# Patient Record
Sex: Male | Born: 1935 | Race: Black or African American | Hispanic: No | Smoking: Never smoker
Health system: Southern US, Community
[De-identification: ages and names within clinical notes are randomized; demographics above are authoritative.]

## PROBLEM LIST (undated history)

## (undated) DIAGNOSIS — F0390 Unspecified dementia without behavioral disturbance: Secondary | ICD-10-CM

## (undated) DIAGNOSIS — C61 Malignant neoplasm of prostate: Secondary | ICD-10-CM

## (undated) DIAGNOSIS — K648 Other hemorrhoids: Secondary | ICD-10-CM

## (undated) DIAGNOSIS — K5731 Diverticulosis of large intestine without perforation or abscess with bleeding: Secondary | ICD-10-CM

## (undated) DIAGNOSIS — M1990 Unspecified osteoarthritis, unspecified site: Secondary | ICD-10-CM

## (undated) DIAGNOSIS — K8020 Calculus of gallbladder without cholecystitis without obstruction: Secondary | ICD-10-CM

## (undated) DIAGNOSIS — F03A0 Unspecified dementia, mild, without behavioral disturbance, psychotic disturbance, mood disturbance, and anxiety: Secondary | ICD-10-CM

## (undated) DIAGNOSIS — I442 Atrioventricular block, complete: Secondary | ICD-10-CM

## (undated) DIAGNOSIS — N179 Acute kidney failure, unspecified: Secondary | ICD-10-CM

## (undated) DIAGNOSIS — I10 Essential (primary) hypertension: Secondary | ICD-10-CM

## (undated) DIAGNOSIS — I4891 Unspecified atrial fibrillation: Secondary | ICD-10-CM

## (undated) DIAGNOSIS — I2510 Atherosclerotic heart disease of native coronary artery without angina pectoris: Secondary | ICD-10-CM

## (undated) DIAGNOSIS — D649 Anemia, unspecified: Secondary | ICD-10-CM

## (undated) DIAGNOSIS — K922 Gastrointestinal hemorrhage, unspecified: Secondary | ICD-10-CM

## (undated) DIAGNOSIS — Z950 Presence of cardiac pacemaker: Secondary | ICD-10-CM

## (undated) DIAGNOSIS — K5010 Crohn's disease of large intestine without complications: Secondary | ICD-10-CM

## (undated) DIAGNOSIS — E875 Hyperkalemia: Secondary | ICD-10-CM

## (undated) DIAGNOSIS — S065X9A Traumatic subdural hemorrhage with loss of consciousness of unspecified duration, initial encounter: Secondary | ICD-10-CM

## (undated) DIAGNOSIS — K259 Gastric ulcer, unspecified as acute or chronic, without hemorrhage or perforation: Secondary | ICD-10-CM

## (undated) DIAGNOSIS — K56609 Unspecified intestinal obstruction, unspecified as to partial versus complete obstruction: Secondary | ICD-10-CM

## (undated) DIAGNOSIS — T148XXA Other injury of unspecified body region, initial encounter: Secondary | ICD-10-CM

## (undated) DIAGNOSIS — A0472 Enterocolitis due to Clostridium difficile, not specified as recurrent: Secondary | ICD-10-CM

## (undated) DIAGNOSIS — S065XAA Traumatic subdural hemorrhage with loss of consciousness status unknown, initial encounter: Secondary | ICD-10-CM

## (undated) DIAGNOSIS — K5090 Crohn's disease, unspecified, without complications: Secondary | ICD-10-CM

## (undated) DIAGNOSIS — J209 Acute bronchitis, unspecified: Secondary | ICD-10-CM

## (undated) HISTORY — DX: Malignant neoplasm of prostate: C61

## (undated) HISTORY — DX: Enterocolitis due to Clostridium difficile, not specified as recurrent: A04.72

## (undated) HISTORY — PX: CATARACT EXTRACTION, BILATERAL: SHX1313

## (undated) HISTORY — DX: Unspecified osteoarthritis, unspecified site: M19.90

## (undated) HISTORY — DX: Anemia, unspecified: D64.9

## (undated) HISTORY — DX: Unspecified dementia without behavioral disturbance: F03.90

## (undated) HISTORY — DX: Gastric ulcer, unspecified as acute or chronic, without hemorrhage or perforation: K25.9

## (undated) HISTORY — DX: Other injury of unspecified body region, initial encounter: T14.8XXA

## (undated) HISTORY — DX: Crohn's disease, unspecified, without complications: K50.90

## (undated) HISTORY — DX: Acute kidney failure, unspecified: N17.9

## (undated) HISTORY — DX: Atrioventricular block, complete: I44.2

## (undated) HISTORY — DX: Traumatic subdural hemorrhage with loss of consciousness status unknown, initial encounter: S06.5XAA

## (undated) HISTORY — DX: Traumatic subdural hemorrhage with loss of consciousness of unspecified duration, initial encounter: S06.5X9A

## (undated) HISTORY — DX: Crohn's disease of large intestine without complications: K50.10

## (undated) HISTORY — DX: Unspecified atrial fibrillation: I48.91

## (undated) HISTORY — PX: TONSILLECTOMY AND ADENOIDECTOMY: SUR1326

## (undated) HISTORY — DX: Essential (primary) hypertension: I10

## (undated) HISTORY — DX: Atherosclerotic heart disease of native coronary artery without angina pectoris: I25.10

## (undated) HISTORY — DX: Hyperkalemia: E87.5

## (undated) HISTORY — PX: INSERT / REPLACE / REMOVE PACEMAKER: SUR710

## (undated) HISTORY — DX: Diverticulosis of large intestine without perforation or abscess with bleeding: K57.31

## (undated) HISTORY — PX: PACEMAKER PLACEMENT: SHX43

## (undated) HISTORY — DX: Gastrointestinal hemorrhage, unspecified: K92.2

## (undated) HISTORY — DX: Acute bronchitis, unspecified: J20.9

## (undated) HISTORY — DX: Unspecified intestinal obstruction, unspecified as to partial versus complete obstruction: K56.609

## (undated) HISTORY — PX: BURR HOLE FOR SUBDURAL HEMATOMA: SHX1275

## (undated) HISTORY — DX: Unspecified dementia, mild, without behavioral disturbance, psychotic disturbance, mood disturbance, and anxiety: F03.A0

## (undated) HISTORY — DX: Other hemorrhoids: K64.8

## (undated) HISTORY — DX: Calculus of gallbladder without cholecystitis without obstruction: K80.20

---

## 1992-06-03 HISTORY — PX: PERICARDIOCENTESIS: SHX2215

## 1998-12-19 ENCOUNTER — Encounter: Payer: Self-pay | Admitting: Emergency Medicine

## 1998-12-19 ENCOUNTER — Emergency Department (HOSPITAL_COMMUNITY): Admission: EM | Admit: 1998-12-19 | Discharge: 1998-12-19 | Payer: Self-pay | Admitting: Emergency Medicine

## 1999-06-04 HISTORY — PX: LIPOMA EXCISION: SHX5283

## 2000-03-04 ENCOUNTER — Encounter: Payer: Self-pay | Admitting: Orthopedic Surgery

## 2000-03-04 ENCOUNTER — Ambulatory Visit (HOSPITAL_COMMUNITY): Admission: RE | Admit: 2000-03-04 | Discharge: 2000-03-04 | Payer: Self-pay | Admitting: Orthopedic Surgery

## 2001-10-29 ENCOUNTER — Encounter (INDEPENDENT_AMBULATORY_CARE_PROVIDER_SITE_OTHER): Payer: Self-pay | Admitting: *Deleted

## 2001-10-29 ENCOUNTER — Inpatient Hospital Stay (HOSPITAL_COMMUNITY): Admission: EM | Admit: 2001-10-29 | Discharge: 2001-11-14 | Payer: Self-pay | Admitting: Emergency Medicine

## 2001-11-06 ENCOUNTER — Encounter: Payer: Self-pay | Admitting: Gastroenterology

## 2001-11-07 ENCOUNTER — Encounter: Payer: Self-pay | Admitting: Surgery

## 2001-11-08 ENCOUNTER — Encounter: Payer: Self-pay | Admitting: Otolaryngology

## 2001-12-25 ENCOUNTER — Emergency Department (HOSPITAL_COMMUNITY): Admission: EM | Admit: 2001-12-25 | Discharge: 2001-12-26 | Payer: Self-pay | Admitting: Emergency Medicine

## 2001-12-25 ENCOUNTER — Encounter: Payer: Self-pay | Admitting: Emergency Medicine

## 2001-12-26 ENCOUNTER — Encounter: Payer: Self-pay | Admitting: Emergency Medicine

## 2001-12-26 ENCOUNTER — Inpatient Hospital Stay (HOSPITAL_COMMUNITY): Admission: EM | Admit: 2001-12-26 | Discharge: 2002-01-05 | Payer: Self-pay | Admitting: Emergency Medicine

## 2001-12-27 ENCOUNTER — Encounter: Payer: Self-pay | Admitting: General Surgery

## 2001-12-28 ENCOUNTER — Encounter: Payer: Self-pay | Admitting: General Surgery

## 2001-12-31 ENCOUNTER — Encounter: Payer: Self-pay | Admitting: Surgery

## 2002-01-15 ENCOUNTER — Ambulatory Visit (HOSPITAL_COMMUNITY): Admission: RE | Admit: 2002-01-15 | Discharge: 2002-01-15 | Payer: Self-pay | Admitting: Surgery

## 2002-01-15 ENCOUNTER — Encounter: Payer: Self-pay | Admitting: Gastroenterology

## 2002-05-03 HISTORY — PX: SUBTOTAL COLECTOMY: SHX855

## 2002-07-01 ENCOUNTER — Inpatient Hospital Stay (HOSPITAL_COMMUNITY): Admission: EM | Admit: 2002-07-01 | Discharge: 2002-07-02 | Payer: Self-pay | Admitting: Emergency Medicine

## 2004-04-18 ENCOUNTER — Ambulatory Visit: Payer: Self-pay | Admitting: Pulmonary Disease

## 2004-04-27 ENCOUNTER — Ambulatory Visit: Payer: Self-pay

## 2004-05-25 ENCOUNTER — Emergency Department (HOSPITAL_COMMUNITY): Admission: EM | Admit: 2004-05-25 | Discharge: 2004-05-25 | Payer: Self-pay | Admitting: Emergency Medicine

## 2004-05-29 ENCOUNTER — Ambulatory Visit: Payer: Self-pay | Admitting: Cardiology

## 2004-07-03 ENCOUNTER — Ambulatory Visit: Payer: Self-pay

## 2004-07-11 ENCOUNTER — Ambulatory Visit: Payer: Self-pay | Admitting: Internal Medicine

## 2004-08-16 ENCOUNTER — Ambulatory Visit: Payer: Self-pay | Admitting: Internal Medicine

## 2004-08-23 ENCOUNTER — Ambulatory Visit: Payer: Self-pay | Admitting: Pulmonary Disease

## 2004-09-04 ENCOUNTER — Ambulatory Visit: Payer: Self-pay | Admitting: Gastroenterology

## 2004-09-11 ENCOUNTER — Ambulatory Visit: Payer: Self-pay | Admitting: Internal Medicine

## 2004-09-24 ENCOUNTER — Ambulatory Visit: Payer: Self-pay | Admitting: Gastroenterology

## 2004-10-15 ENCOUNTER — Ambulatory Visit: Payer: Self-pay | Admitting: Internal Medicine

## 2004-11-08 ENCOUNTER — Ambulatory Visit: Payer: Self-pay

## 2004-11-13 ENCOUNTER — Ambulatory Visit: Payer: Self-pay | Admitting: Internal Medicine

## 2004-11-23 ENCOUNTER — Ambulatory Visit (HOSPITAL_COMMUNITY): Admission: RE | Admit: 2004-11-23 | Discharge: 2004-11-23 | Payer: Self-pay | Admitting: Internal Medicine

## 2004-11-23 ENCOUNTER — Ambulatory Visit: Payer: Self-pay | Admitting: Internal Medicine

## 2004-12-10 ENCOUNTER — Ambulatory Visit: Payer: Self-pay

## 2004-12-13 ENCOUNTER — Ambulatory Visit: Payer: Self-pay | Admitting: Internal Medicine

## 2004-12-17 ENCOUNTER — Ambulatory Visit: Payer: Self-pay | Admitting: Pulmonary Disease

## 2005-03-12 ENCOUNTER — Ambulatory Visit: Payer: Self-pay | Admitting: Cardiology

## 2005-03-12 ENCOUNTER — Inpatient Hospital Stay (HOSPITAL_COMMUNITY): Admission: EM | Admit: 2005-03-12 | Discharge: 2005-03-13 | Payer: Self-pay | Admitting: *Deleted

## 2005-03-18 ENCOUNTER — Ambulatory Visit: Payer: Self-pay

## 2005-03-28 ENCOUNTER — Ambulatory Visit: Payer: Self-pay | Admitting: Internal Medicine

## 2005-03-28 ENCOUNTER — Ambulatory Visit: Payer: Self-pay | Admitting: Cardiology

## 2005-04-19 ENCOUNTER — Ambulatory Visit: Payer: Self-pay | Admitting: Pulmonary Disease

## 2005-08-19 ENCOUNTER — Ambulatory Visit: Payer: Self-pay | Admitting: Pulmonary Disease

## 2005-09-18 ENCOUNTER — Ambulatory Visit: Payer: Self-pay | Admitting: Cardiology

## 2005-11-18 ENCOUNTER — Ambulatory Visit: Payer: Self-pay

## 2005-11-20 ENCOUNTER — Ambulatory Visit: Payer: Self-pay | Admitting: Pulmonary Disease

## 2006-02-11 ENCOUNTER — Ambulatory Visit: Payer: Self-pay | Admitting: Pulmonary Disease

## 2006-04-02 ENCOUNTER — Ambulatory Visit: Payer: Self-pay | Admitting: Internal Medicine

## 2006-05-28 ENCOUNTER — Ambulatory Visit: Payer: Self-pay | Admitting: Internal Medicine

## 2006-06-05 ENCOUNTER — Ambulatory Visit: Payer: Self-pay | Admitting: Pulmonary Disease

## 2006-06-06 ENCOUNTER — Ambulatory Visit: Payer: Self-pay | Admitting: Cardiology

## 2006-06-13 ENCOUNTER — Ambulatory Visit: Payer: Self-pay | Admitting: Gastroenterology

## 2006-06-18 ENCOUNTER — Encounter (INDEPENDENT_AMBULATORY_CARE_PROVIDER_SITE_OTHER): Payer: Self-pay | Admitting: Specialist

## 2006-06-18 ENCOUNTER — Ambulatory Visit: Payer: Self-pay | Admitting: Gastroenterology

## 2006-07-21 ENCOUNTER — Ambulatory Visit: Payer: Self-pay | Admitting: Gastroenterology

## 2006-07-23 ENCOUNTER — Ambulatory Visit: Payer: Self-pay | Admitting: Internal Medicine

## 2006-09-17 ENCOUNTER — Ambulatory Visit: Payer: Self-pay | Admitting: Internal Medicine

## 2006-10-01 ENCOUNTER — Ambulatory Visit: Payer: Self-pay | Admitting: Pulmonary Disease

## 2006-11-12 ENCOUNTER — Ambulatory Visit: Payer: Self-pay | Admitting: Internal Medicine

## 2007-01-07 ENCOUNTER — Ambulatory Visit: Payer: Self-pay | Admitting: Internal Medicine

## 2007-03-04 ENCOUNTER — Ambulatory Visit: Payer: Self-pay | Admitting: Internal Medicine

## 2007-06-04 HISTORY — PX: COLONOSCOPY: SHX174

## 2007-06-10 ENCOUNTER — Ambulatory Visit: Payer: Self-pay | Admitting: Internal Medicine

## 2007-06-11 ENCOUNTER — Telehealth: Payer: Self-pay | Admitting: Pulmonary Disease

## 2007-08-17 ENCOUNTER — Ambulatory Visit: Payer: Self-pay | Admitting: Internal Medicine

## 2007-10-06 ENCOUNTER — Ambulatory Visit: Payer: Self-pay | Admitting: Gastroenterology

## 2007-10-06 LAB — CONVERTED CEMR LAB
AST: 18 units/L (ref 0–37)
BUN: 16 mg/dL (ref 6–23)
Basophils Absolute: 0.1 10*3/uL (ref 0.0–0.1)
Basophils Relative: 0.8 % (ref 0.0–1.0)
Calcium: 9.4 mg/dL (ref 8.4–10.5)
Eosinophils Relative: 2.6 % (ref 0.0–5.0)
Ferritin: 67.5 ng/mL (ref 22.0–322.0)
Folate: >20 ng/mL
GFR calc Af Amer: 67 mL/min
GFR calc non Af Amer: 55 mL/min
Hemoglobin: 13.2 g/dL (ref 13.0–17.0)
Lymphocytes Relative: 25.1 % (ref 12.0–46.0)
MCHC: 33.6 g/dL (ref 30.0–36.0)
MCV: 101.2 fL — ABNORMAL HIGH (ref 78.0–100.0)
Neutro Abs: 6.3 10*3/uL (ref 1.4–7.7)
Potassium: 4.4 meq/L (ref 3.5–5.1)
RBC: 3.89 M/uL — ABNORMAL LOW (ref 4.22–5.81)
Transferrin: 219.6 mg/dL (ref >=212.0)
Vitamin B-12: 1265 pg/mL — ABNORMAL HIGH (ref 211–911)

## 2007-10-14 ENCOUNTER — Ambulatory Visit: Payer: Self-pay | Admitting: Gastroenterology

## 2007-10-15 LAB — CONVERTED CEMR LAB
Fecal Occult Blood: NEGATIVE
OCCULT 2: POSITIVE — AB
OCCULT 3: POSITIVE — AB
OCCULT 4: POSITIVE — AB
OCCULT 5: NEGATIVE

## 2007-10-21 ENCOUNTER — Ambulatory Visit: Payer: Self-pay | Admitting: Gastroenterology

## 2007-10-28 ENCOUNTER — Encounter: Payer: Self-pay | Admitting: Gastroenterology

## 2007-10-28 ENCOUNTER — Ambulatory Visit: Payer: Self-pay | Admitting: Gastroenterology

## 2007-10-30 ENCOUNTER — Encounter: Payer: Self-pay | Admitting: Gastroenterology

## 2007-11-16 ENCOUNTER — Ambulatory Visit: Payer: Self-pay | Admitting: Internal Medicine

## 2007-11-26 DIAGNOSIS — I10 Essential (primary) hypertension: Secondary | ICD-10-CM

## 2007-11-26 DIAGNOSIS — K922 Gastrointestinal hemorrhage, unspecified: Secondary | ICD-10-CM | POA: Insufficient documentation

## 2007-11-26 DIAGNOSIS — Z8546 Personal history of malignant neoplasm of prostate: Secondary | ICD-10-CM

## 2007-11-26 DIAGNOSIS — I6200 Nontraumatic subdural hemorrhage, unspecified: Secondary | ICD-10-CM

## 2007-11-27 ENCOUNTER — Ambulatory Visit: Payer: Self-pay | Admitting: Gastroenterology

## 2007-12-01 ENCOUNTER — Encounter: Payer: Self-pay | Admitting: Gastroenterology

## 2007-12-18 ENCOUNTER — Ambulatory Visit: Payer: Self-pay | Admitting: Internal Medicine

## 2008-01-27 ENCOUNTER — Telehealth: Payer: Self-pay | Admitting: Gastroenterology

## 2008-02-15 ENCOUNTER — Ambulatory Visit: Payer: Self-pay | Admitting: Internal Medicine

## 2008-02-24 ENCOUNTER — Ambulatory Visit: Payer: Self-pay | Admitting: Pulmonary Disease

## 2008-03-28 ENCOUNTER — Telehealth (INDEPENDENT_AMBULATORY_CARE_PROVIDER_SITE_OTHER): Payer: Self-pay | Admitting: *Deleted

## 2008-06-14 ENCOUNTER — Telehealth (INDEPENDENT_AMBULATORY_CARE_PROVIDER_SITE_OTHER): Payer: Self-pay | Admitting: *Deleted

## 2008-06-16 ENCOUNTER — Ambulatory Visit: Payer: Self-pay | Admitting: Pulmonary Disease

## 2008-06-16 DIAGNOSIS — I2510 Atherosclerotic heart disease of native coronary artery without angina pectoris: Secondary | ICD-10-CM

## 2008-06-16 DIAGNOSIS — M1990 Unspecified osteoarthritis, unspecified site: Secondary | ICD-10-CM

## 2008-06-16 DIAGNOSIS — K4090 Unilateral inguinal hernia, without obstruction or gangrene, not specified as recurrent: Secondary | ICD-10-CM | POA: Insufficient documentation

## 2008-06-16 DIAGNOSIS — I442 Atrioventricular block, complete: Secondary | ICD-10-CM

## 2008-06-16 DIAGNOSIS — M109 Gout, unspecified: Secondary | ICD-10-CM | POA: Insufficient documentation

## 2008-06-16 DIAGNOSIS — H409 Unspecified glaucoma: Secondary | ICD-10-CM | POA: Insufficient documentation

## 2008-06-16 DIAGNOSIS — J209 Acute bronchitis, unspecified: Secondary | ICD-10-CM

## 2008-06-16 DIAGNOSIS — K5731 Diverticulosis of large intestine without perforation or abscess with bleeding: Secondary | ICD-10-CM

## 2008-06-17 ENCOUNTER — Ambulatory Visit: Payer: Self-pay | Admitting: Pulmonary Disease

## 2008-06-20 ENCOUNTER — Telehealth: Payer: Self-pay | Admitting: Pulmonary Disease

## 2008-06-21 ENCOUNTER — Ambulatory Visit: Payer: Self-pay | Admitting: Internal Medicine

## 2008-06-22 ENCOUNTER — Encounter: Payer: Self-pay | Admitting: Pulmonary Disease

## 2008-06-26 LAB — CONVERTED CEMR LAB
ALT: 11 units/L (ref 0–53)
Albumin: 3.3 g/dL — ABNORMAL LOW (ref 3.5–5.2)
Alkaline Phosphatase: 68 units/L (ref 39–117)
BUN: 17 mg/dL (ref 6–23)
CO2: 29 meq/L (ref 19–32)
Calcium: 9.2 mg/dL (ref 8.4–10.5)
Eosinophils Absolute: 0.2 10*3/uL (ref 0.0–0.7)
Glucose, Bld: 102 mg/dL — ABNORMAL HIGH (ref 70–99)
HCT: 39.2 % (ref 39.0–52.0)
HDL: 28 mg/dL — ABNORMAL LOW (ref >39.0)
Hemoglobin: 13.3 g/dL (ref 13.0–17.0)
MCHC: 33.8 g/dL (ref 30.0–36.0)
MCV: 101.4 fL — ABNORMAL HIGH (ref 78.0–100.0)
Monocytes Absolute: 0.6 10*3/uL (ref 0.1–1.0)
Monocytes Relative: 6.7 % (ref 3.0–12.0)
Neutro Abs: 6.2 10*3/uL (ref 1.4–7.7)
PSA: 1.37 ng/mL (ref 0.10–4.00)
Potassium: 4.6 meq/L (ref 3.5–5.1)
Pro B Natriuretic peptide (BNP): 25 pg/mL (ref 0.0–100.0)
RBC: 3.87 M/uL — ABNORMAL LOW (ref 4.22–5.81)
Sodium: 140 meq/L (ref 135–145)
Total CHOL/HDL Ratio: 4.8
Total Protein: 7.1 g/dL (ref 6.0–8.3)
WBC: 8.9 10*3/uL (ref 4.5–10.5)

## 2008-06-29 ENCOUNTER — Ambulatory Visit: Payer: Self-pay | Admitting: Pulmonary Disease

## 2008-07-11 ENCOUNTER — Ambulatory Visit: Payer: Self-pay | Admitting: Cardiology

## 2008-09-09 ENCOUNTER — Encounter (INDEPENDENT_AMBULATORY_CARE_PROVIDER_SITE_OTHER): Payer: Self-pay

## 2008-09-20 ENCOUNTER — Ambulatory Visit: Payer: Self-pay | Admitting: Internal Medicine

## 2008-09-22 ENCOUNTER — Ambulatory Visit: Payer: Self-pay | Admitting: Pulmonary Disease

## 2008-10-12 ENCOUNTER — Telehealth (INDEPENDENT_AMBULATORY_CARE_PROVIDER_SITE_OTHER): Payer: Self-pay | Admitting: *Deleted

## 2008-11-09 ENCOUNTER — Telehealth: Payer: Self-pay | Admitting: Cardiology

## 2008-12-06 ENCOUNTER — Ambulatory Visit: Payer: Self-pay | Admitting: Internal Medicine

## 2009-01-09 ENCOUNTER — Ambulatory Visit: Payer: Self-pay | Admitting: Cardiology

## 2009-01-23 ENCOUNTER — Ambulatory Visit: Payer: Self-pay | Admitting: Internal Medicine

## 2009-03-15 ENCOUNTER — Ambulatory Visit: Payer: Self-pay | Admitting: Pulmonary Disease

## 2009-05-11 ENCOUNTER — Ambulatory Visit: Payer: Self-pay | Admitting: Internal Medicine

## 2009-06-03 DIAGNOSIS — K259 Gastric ulcer, unspecified as acute or chronic, without hemorrhage or perforation: Secondary | ICD-10-CM

## 2009-06-03 HISTORY — DX: Gastric ulcer, unspecified as acute or chronic, without hemorrhage or perforation: K25.9

## 2009-06-07 ENCOUNTER — Ambulatory Visit: Payer: Self-pay

## 2009-06-07 ENCOUNTER — Encounter: Payer: Self-pay | Admitting: Internal Medicine

## 2009-07-07 ENCOUNTER — Ambulatory Visit: Payer: Self-pay | Admitting: Cardiology

## 2009-07-12 ENCOUNTER — Inpatient Hospital Stay (HOSPITAL_COMMUNITY): Admission: EM | Admit: 2009-07-12 | Discharge: 2009-08-04 | Payer: Self-pay | Admitting: Emergency Medicine

## 2009-07-12 ENCOUNTER — Ambulatory Visit: Payer: Self-pay | Admitting: Internal Medicine

## 2009-07-23 ENCOUNTER — Ambulatory Visit: Payer: Self-pay | Admitting: Vascular Surgery

## 2009-07-23 ENCOUNTER — Encounter (INDEPENDENT_AMBULATORY_CARE_PROVIDER_SITE_OTHER): Payer: Self-pay | Admitting: Internal Medicine

## 2009-08-31 ENCOUNTER — Telehealth (INDEPENDENT_AMBULATORY_CARE_PROVIDER_SITE_OTHER): Payer: Self-pay | Admitting: *Deleted

## 2009-09-04 ENCOUNTER — Telehealth: Payer: Self-pay | Admitting: Gastroenterology

## 2009-09-05 ENCOUNTER — Ambulatory Visit: Payer: Self-pay | Admitting: Gastroenterology

## 2009-09-05 DIAGNOSIS — R197 Diarrhea, unspecified: Secondary | ICD-10-CM | POA: Insufficient documentation

## 2009-09-06 LAB — CONVERTED CEMR LAB
ALT: 35 units/L (ref 0–53)
AST: 32 units/L (ref 0–37)
Albumin: 3 g/dL — ABNORMAL LOW (ref 3.5–5.2)
BUN: 8 mg/dL (ref 6–23)
Basophils Absolute: 0 10*3/uL (ref 0.0–0.1)
Basophils Relative: 0.3 % (ref 0.0–3.0)
Creatinine, Ser: 1.1 mg/dL (ref 0.4–1.5)
Eosinophils Relative: 2.7 % (ref 0.0–5.0)
Ferritin: 168.6 ng/mL (ref 22.0–322.0)
Folate: 14.6 ng/mL
GFR calc non Af Amer: 80.47 mL/min (ref >60)
Glucose, Bld: 93 mg/dL (ref 70–99)
HCT: 36.2 % — ABNORMAL LOW (ref 39.0–52.0)
Hemoglobin: 12.1 g/dL — ABNORMAL LOW (ref 13.0–17.0)
MCHC: 33.5 g/dL (ref 30.0–36.0)
Monocytes Absolute: 0.8 10*3/uL (ref 0.1–1.0)
Monocytes Relative: 10.6 % (ref 3.0–12.0)
Platelets: 225 10*3/uL (ref 150.0–400.0)
Potassium: 3.7 meq/L (ref 3.5–5.1)
RDW: 20.1 % — ABNORMAL HIGH (ref 11.5–14.6)
Sodium: 141 meq/L (ref 135–145)
TSH: 2.4 microintl units/mL (ref 0.35–5.50)
Total Bilirubin: 1.3 mg/dL — ABNORMAL HIGH (ref 0.3–1.2)
WBC: 7.5 10*3/uL (ref 4.5–10.5)

## 2009-09-11 ENCOUNTER — Ambulatory Visit: Payer: Self-pay | Admitting: Pulmonary Disease

## 2009-09-13 ENCOUNTER — Encounter: Payer: Self-pay | Admitting: Pulmonary Disease

## 2009-09-13 ENCOUNTER — Ambulatory Visit (HOSPITAL_COMMUNITY)
Admission: RE | Admit: 2009-09-13 | Discharge: 2009-09-13 | Payer: Self-pay | Source: Home / Self Care | Admitting: Gastroenterology

## 2009-09-13 LAB — CONVERTED CEMR LAB
Albumin: 3 g/dL — ABNORMAL LOW (ref 3.5–5.2)
BUN: 7 mg/dL (ref 6–23)
Basophils Absolute: 0 10*3/uL (ref 0.0–0.1)
CO2: 30 meq/L (ref 19–32)
Calcium: 8.6 mg/dL (ref 8.4–10.5)
Chloride: 107 meq/L (ref 96–112)
Creatinine, Ser: 1.1 mg/dL (ref 0.4–1.5)
Eosinophils Absolute: 0.2 10*3/uL (ref 0.0–0.7)
GFR calc non Af Amer: 80.47 mL/min (ref >60)
Glucose, Bld: 98 mg/dL (ref 70–99)
HCT: 36.1 % — ABNORMAL LOW (ref 39.0–52.0)
Hemoglobin: 12.4 g/dL — ABNORMAL LOW (ref 13.0–17.0)
Lymphs Abs: 1.9 10*3/uL (ref 0.7–4.0)
MCHC: 34.5 g/dL (ref 30.0–36.0)
MCV: 95.9 fL (ref 78.0–100.0)
Monocytes Absolute: 0.9 10*3/uL (ref 0.1–1.0)
Neutro Abs: 6.2 10*3/uL (ref 1.4–7.7)
Neutrophils Relative %: 67 % (ref 43.0–77.0)
RBC: 3.76 M/uL — ABNORMAL LOW (ref 4.22–5.81)
TSH: 2.2 microintl units/mL (ref 0.35–5.50)
Total Protein: 7.4 g/dL (ref 6.0–8.3)
Vitamin B-12: 778 pg/mL (ref 211–911)
WBC: 9.2 10*3/uL (ref 4.5–10.5)

## 2009-09-15 ENCOUNTER — Ambulatory Visit: Payer: Self-pay | Admitting: Gastroenterology

## 2009-09-15 ENCOUNTER — Encounter: Payer: Self-pay | Admitting: Pulmonary Disease

## 2009-09-15 DIAGNOSIS — K8020 Calculus of gallbladder without cholecystitis without obstruction: Secondary | ICD-10-CM | POA: Insufficient documentation

## 2009-09-15 LAB — CONVERTED CEMR LAB
ALT: 18 units/L (ref 0–53)
Alkaline Phosphatase: 173 units/L — ABNORMAL HIGH (ref 39–117)
Bilirubin, Direct: 0.5 mg/dL — ABNORMAL HIGH (ref 0.0–0.3)
Total Bilirubin: 1 mg/dL (ref 0.3–1.2)

## 2009-09-18 ENCOUNTER — Encounter: Payer: Self-pay | Admitting: Pulmonary Disease

## 2009-09-19 ENCOUNTER — Telehealth: Payer: Self-pay | Admitting: Gastroenterology

## 2009-09-25 ENCOUNTER — Ambulatory Visit: Payer: Self-pay | Admitting: Cardiovascular Disease

## 2009-09-27 ENCOUNTER — Telehealth: Payer: Self-pay | Admitting: Gastroenterology

## 2009-09-27 ENCOUNTER — Encounter: Payer: Self-pay | Admitting: Pulmonary Disease

## 2009-10-03 ENCOUNTER — Ambulatory Visit: Payer: Self-pay | Admitting: Gastroenterology

## 2009-10-12 ENCOUNTER — Encounter: Payer: Self-pay | Admitting: Pulmonary Disease

## 2009-10-18 ENCOUNTER — Encounter: Payer: Self-pay | Admitting: Pulmonary Disease

## 2009-10-23 ENCOUNTER — Ambulatory Visit: Payer: Self-pay | Admitting: Pulmonary Disease

## 2009-11-03 ENCOUNTER — Encounter: Payer: Self-pay | Admitting: Cardiology

## 2009-11-07 ENCOUNTER — Telehealth: Payer: Self-pay | Admitting: Gastroenterology

## 2009-11-10 ENCOUNTER — Ambulatory Visit: Payer: Self-pay | Admitting: Internal Medicine

## 2009-11-20 ENCOUNTER — Ambulatory Visit: Payer: Self-pay | Admitting: Gastroenterology

## 2009-11-20 ENCOUNTER — Telehealth: Payer: Self-pay | Admitting: Cardiology

## 2009-12-18 ENCOUNTER — Telehealth: Payer: Self-pay | Admitting: Cardiology

## 2009-12-28 ENCOUNTER — Ambulatory Visit: Payer: Self-pay | Admitting: Cardiology

## 2010-01-02 ENCOUNTER — Telehealth: Payer: Self-pay | Admitting: Gastroenterology

## 2010-01-23 ENCOUNTER — Ambulatory Visit: Payer: Self-pay | Admitting: Internal Medicine

## 2010-01-23 LAB — CONVERTED CEMR LAB
BUN: 17 mg/dL (ref 6–23)
Calcium: 8.9 mg/dL (ref 8.4–10.5)
Creatinine, Ser: 1.2 mg/dL (ref 0.4–1.5)
GFR calc non Af Amer: 76.38 mL/min (ref >60)
Potassium: 4.3 meq/L (ref 3.5–5.1)

## 2010-01-26 ENCOUNTER — Ambulatory Visit: Payer: Self-pay | Admitting: Pulmonary Disease

## 2010-04-04 ENCOUNTER — Telehealth (INDEPENDENT_AMBULATORY_CARE_PROVIDER_SITE_OTHER): Payer: Self-pay | Admitting: *Deleted

## 2010-04-06 ENCOUNTER — Ambulatory Visit: Payer: Self-pay | Admitting: Pulmonary Disease

## 2010-04-06 LAB — CONVERTED CEMR LAB
Chloride: 109 meq/L (ref 96–112)
Creatinine, Ser: 1.1 mg/dL (ref 0.4–1.5)
GFR calc non Af Amer: 81.22 mL/min (ref >60)
Hgb A1c MFr Bld: 5.2 % (ref 4.6–6.5)
Sodium: 142 meq/L (ref 135–145)

## 2010-04-28 ENCOUNTER — Ambulatory Visit: Payer: Self-pay | Admitting: Internal Medicine

## 2010-06-06 ENCOUNTER — Ambulatory Visit
Admission: RE | Admit: 2010-06-06 | Discharge: 2010-06-06 | Payer: Self-pay | Source: Home / Self Care | Attending: Cardiology | Admitting: Cardiology

## 2010-06-21 ENCOUNTER — Other Ambulatory Visit: Payer: Self-pay | Admitting: Pulmonary Disease

## 2010-06-21 ENCOUNTER — Ambulatory Visit
Admission: RE | Admit: 2010-06-21 | Discharge: 2010-06-21 | Payer: Self-pay | Source: Home / Self Care | Attending: Pulmonary Disease | Admitting: Pulmonary Disease

## 2010-06-21 LAB — IBC PANEL
Iron: 115 ug/dL (ref 42–165)
Saturation Ratios: 39.7 % (ref 20.0–50.0)
Transferrin: 207.1 mg/dL — ABNORMAL LOW (ref 212.0–360.0)

## 2010-06-21 LAB — BASIC METABOLIC PANEL
BUN: 24 mg/dL — ABNORMAL HIGH (ref 6–23)
CO2: 27 mEq/L (ref 19–32)
Calcium: 8.9 mg/dL (ref 8.4–10.5)
Chloride: 103 mEq/L (ref 96–112)
Creatinine, Ser: 1.5 mg/dL (ref 0.4–1.5)
GFR: 55.31 mL/min — ABNORMAL LOW (ref >60.00)
Glucose, Bld: 77 mg/dL (ref 70–99)
Potassium: 4.1 mEq/L (ref 3.5–5.1)
Sodium: 137 mEq/L (ref 135–145)

## 2010-06-21 LAB — CBC WITH DIFFERENTIAL/PLATELET
Basophils Absolute: 0 10*3/uL (ref 0.0–0.1)
Basophils Relative: 0.3 % (ref 0.0–3.0)
Eosinophils Absolute: 0.1 10*3/uL (ref 0.0–0.7)
Eosinophils Relative: 1.5 % (ref 0.0–5.0)
HCT: 36.7 % — ABNORMAL LOW (ref 39.0–52.0)
Hemoglobin: 12.5 g/dL — ABNORMAL LOW (ref 13.0–17.0)
Lymphocytes Relative: 16.1 % (ref 12.0–46.0)
Lymphs Abs: 1.4 10*3/uL (ref 0.7–4.0)
MCHC: 34 g/dL (ref 30.0–36.0)
MCV: 103.8 fl — ABNORMAL HIGH (ref 78.0–100.0)
Monocytes Absolute: 0.7 10*3/uL (ref 0.1–1.0)
Monocytes Relative: 8.5 % (ref 3.0–12.0)
Neutro Abs: 6.4 10*3/uL (ref 1.4–7.7)
Neutrophils Relative %: 73.6 % (ref 43.0–77.0)
Platelets: 178 10*3/uL (ref 150.0–400.0)
RBC: 3.54 Mil/uL — ABNORMAL LOW (ref 4.22–5.81)
RDW: 15.5 % — ABNORMAL HIGH (ref 11.5–14.6)
WBC: 8.7 10*3/uL (ref 4.5–10.5)

## 2010-06-21 LAB — HEPATIC FUNCTION PANEL
ALT: 38 U/L (ref 0–53)
AST: 27 U/L (ref 0–37)
Albumin: 3.2 g/dL — ABNORMAL LOW (ref 3.5–5.2)
Alkaline Phosphatase: 150 U/L — ABNORMAL HIGH (ref 39–117)
Bilirubin, Direct: 0.6 mg/dL — ABNORMAL HIGH (ref 0.0–0.3)
Total Bilirubin: 1.2 mg/dL (ref 0.3–1.2)
Total Protein: 7 g/dL (ref 6.0–8.3)

## 2010-06-21 LAB — PSA: PSA: 1.38 ng/mL (ref 0.10–4.00)

## 2010-06-21 LAB — TSH: TSH: 1.56 u[IU]/mL (ref 0.35–5.50)

## 2010-06-24 ENCOUNTER — Encounter: Payer: Self-pay | Admitting: Gastroenterology

## 2010-07-03 NOTE — Progress Notes (Signed)
Summary: appt  Phone Note From Other Clinic Call back at [phone]   Caller: [facility] Call For: nadel Summary of Call: pt is being discharged from skilled nursing to home on April 2.  He needs appointment with SN within 2 weeks of discharge per Carrolyn Leigh @ [facility]. Initial call taken by: Eugene Gavia,  August 31, 2009 10:46 AM  Follow-up for Phone Call        please Gray Bernhardt of possible slot. Thanks. Carron Curie CMA  August 31, 2009 10:48 AM    ok for appt on 4-11 at 3pm.  thanks Randell Loop [facility]  August 31, 2009 2:47 PM   Additional Follow-up for Phone Call Additional follow up Details #1::        called, Lafonda Mosses at [facility] informed her pt can come in 4-11 at 3pm she verbalized understanding.  Gweneth Dimitri RN  August 31, 2009 2:56 PM

## 2010-07-03 NOTE — Cardiovascular Report (Signed)
Summary: TTM   TTM   Imported By: Roderic Ovens 12/11/2009 14:52:17  _____________________________________________________________________  External Attachment:    Type:   Image     Comment:   External Document

## 2010-07-03 NOTE — Miscellaneous (Signed)
Summary: Orders/[facility]  Orders/[facility]   Imported By: Lester [place] 09/19/2009 10:56:17  _____________________________________________________________________  External Attachment:    Type:   Image     Comment:   External Document

## 2010-07-03 NOTE — Progress Notes (Signed)
Summary: discuss poss DM > ov w/ 11.4.11  Phone Note Call from Patient Call back at Home Phone [phone]   Caller: Patient Call For: nadel Summary of Call: Pt states he saw a podiatrist on 10/27 and he thinks he should be tested for diabetes, pls advise. Initial call taken by: Darletta Moll,  April 04, 2010 1:49 PM  Follow-up for Phone Call        called spoke with patient who states he recently saw his podiatrist for his toenails and stated that the doc asked him if he was bothered by feet like pt had something he was concerned about.  has upcoming ov 1.19.12 but would like to be seen sooner to discuss.  pt denies any symptoms like increased thirst/urination, hypoglycemia, changes in vision, etc.  SN had cancellation friday, appt scheduled for 11.4.11 @ 0930.  pt okay with this date and time. Follow-up by: Boone Master CNA/MA,  April 04, 2010 3:38 PM

## 2010-07-03 NOTE — Progress Notes (Signed)
Summary: Triage  Phone Note Call from Patient Call back at Home Phone [phone]   Caller: Patient Call For: Dr. Jarold Motto Reason for Call: Talk to Nurse Summary of Call: pt. saw Dr. Daleen Squibb for gallstones...would like to discuss Initial call taken by: Karna Christmas,  January 02, 2010 11:38 AM  Follow-up for Phone Call        LM for pt to call.   Ashok Cordia RN  January 02, 2010 11:55 AM  Talked with pt.  He saw Dr. Daleen Squibb for preop re gallbladder surgery.  Dr. Daleen Squibb did not reccomend that pt have surgery at this time since hs has no symptoms at the present time. Follow-up by: Ashok Cordia RN,  January 02, 2010 3:50 PM

## 2010-07-03 NOTE — Assessment & Plan Note (Signed)
Summary: Recheck, pt request, dfs   History of Present Illness Visit Type: Follow-up Visit Primary GI MD: Sheryn Bison MD FACP FAGA Primary Provider: [name], MD Requesting Provider: n/a Chief Complaint: follow up visit today, pt states he is not having any problems today History of Present Illness:   77 year old patient with supposed symptomatic cholelithiasis, chronically abnormal imaging studies of his gallbladder, and recent hospitalization for unexplained nausea and vomiting and sepsis. At the time of his hospitalization he had a GI bleed related to trauma from passage of a NG tube. He denies any current symptomatology. He has had previous left hemicolectomy for complicated diverticulitis.  His multiple cardiovascular issues and has a pacemaker in place and is followed by Dr. Valera Castle. His surgeon is Dr. Ovidio Kin who recently evaluated the patient and has advised against cholecystectomy. Mr. Rivet is currently asymptomatic and is following a low fat diet. His bowels are moving well and he denies melena or hematochezia, nausea vomiting, fever, chills, or abdominal pain.   GI Review of Systems      Denies abdominal pain, acid reflux, belching, bloating, chest pain, dysphagia with liquids, dysphagia with solids, heartburn, loss of appetite, nausea, vomiting, vomiting blood, weight loss, and  weight gain.        Denies anal fissure, black tarry stools, change in bowel habit, constipation, diarrhea, diverticulosis, fecal incontinence, heme positive stool, hemorrhoids, irritable bowel syndrome, jaundice, light color stool, liver problems, rectal bleeding, and  rectal pain.    Current Medications (verified): 1)  Alphagan P 0.1 %  Soln (Brimonidine Tartrate) .... One Gtt Ou Three Times A Day 2)  Cosopt 2-0.5 %  Soln (Dorzolamide-Timolol) .... One Gtt Ou Two Times A Day 3)  Xalatan 0.005 % Soln (Latanoprost) .Marland Kitchen.. 1 Drop in Each Eye At Bedtime 4)  Carvedilol 6.25 Mg Tabs  (Carvedilol) .... Take One Tablet By Mouth Twice A Day 5)  Quinapril Hcl 20 Mg  Tabs (Quinapril Hcl) .... One By Mouth Two Times A Day 6)  Furosemide 20 Mg  Tabs (Furosemide) .... One By Mouth Once Daily 7)  Allopurinol 300 Mg  Tabs (Allopurinol) .... Take 1 Tablet By Mouth Once A Day 8)  Centrum Silver   Tabs (Multiple Vitamins-Minerals) .... One By Mouth Once Daily 9)  Florastor 250 Mg Caps (Saccharomyces Boulardii) .... Take One By Mouth Two Times A Day As Directed By Drpatterson... 10)  Micro-K 10 Meq Cr-Caps (Potassium Chloride) .... Take 2 By Mouth Once Daily... 11)  Vitamin D 1000 Unit Tabs (Cholecalciferol) .... Take 1 Cap By Mouth Once Daily... 12)  Sanctura Xr 60 Mg Xr24h-Cap (Trospium Chloride) .... Take 1 Tablet By Mouth Once A Day  Allergies (verified): No Known Drug Allergies  Past History:  Family History: Last updated: 11/27/2007 No FH of Colon Cancer:  Social History: Last updated: 09/15/2009 Married, wife= [place], 20yrs 4 children Alcohol Use - yes wine occ Patient has never smoked.  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  Past Medical History: Reviewed history from 10/23/2009 and no changes required. GLAUCOMA (ICD-365.9) Hx of ASTHMATIC BRONCHITIS, ACUTE (ICD-466.0) HYPERTENSION (ICD-401.9) CORONARY ARTERY DISEASE (ICD-414.00) Hx of AV BLOCK, COMPLETE (ICD-426.0) CARDIAC PACEMAKER IN SITU-ST JUDE-VICTORY 5816 (ICD-V45.01) DIVERTICULOSIS OF COLON WITH HEMORRHAGE (ICD-562.12) GASTROINTESTINAL HEMORRHAGE (ICD-578.9) Hx of SMALL BOWEL OBSTRUCTION (ICD-560.9) INGUINAL HERNIA (ICD-550.90) CARCINOMA, PROSTATE, HX OF (ICD-V10.46) DEGENERATIVE JOINT DISEASE (ICD-715.90) Hx of GOUT (  ICD-274.9) HEMATOMA, SUBDURAL (ICD-432.1)  Past Surgical History: Reviewed history from 10/23/2009 and no changes required. Pacemaker  Implant S/P bilat cataract surgery S/P tonsillectomy at age 59 S/P subtotal colectomy for diverticular bleed by [facility] 12/03 S/P lipoma removed from left leg 2001 [facility]Ward at [facility] 2/11 by [facility] w/ part SBO- did not require surg.  Family History: Reviewed history from 11/27/2007 and no changes required. No FH of Colon Cancer:  Social History: Reviewed history from 09/15/2009 and no changes required. Married, wife= [place], 15yrs 4 children Alcohol Use - yes wine occ Patient has never smoked.  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    Vital Signs:  Patient profile:   77 year old male Height:      73 inches Weight:      218 pounds BMI:     28.87 BSA:     2.23 Pulse rate:   64 / minute Pulse rhythm:   regular BP sitting:   120 / 82  (left arm)  Vitals Entered By: Merri Ray CMA Duncan Dull) (November 20, 2009 2:39 PM)  Physical Exam  General:  Well developed, well nourished, no acute distress.healthy appearing.  Very elderly appearing patient but in no acute distress, oriented x3, and he is completely ambulatory. Head:  Normocephalic and atraumatic. Eyes:  PERRLA, no icterus.exam deferred to patient's ophthalmologist.   Abdomen:  Soft, nontender and nondistended. No masses, hepatosplenomegaly or hernias noted. Normal bowel sounds. Psych:  Alert and cooperative. Normal mood and  affect.   Impression & Recommendations:  Problem # 1:  GALLSTONES (ICD-574.20) Assessment Improved I have had multiple discussions with the patient, his son, and his wife, and I reviewed in detail his evaluations from his cardiologist, Dr.Nadel his primary care physician, and the extensive notes of Dr. Ezzard Standing. Because of his cardiovascular comorbidities, history of previous surgery which would make laparoscopy difficult, and his current excellent state of well being, have advised the patient and his family that we will continue to observe him diligently for any recurrent abdominal pain or hepatobiliary complaints. I agree that he is high risk for surgery, but he also is high risk for complications from his cholelithiasis. I have reviewed all of his medications with him and his family today, and will continue all of his medications but discontinue probiotic therapy. I will send this letter to his multiple physicians involved in his care.  Problem # 2:  TRANSAMINASES, SERUM, ELEVATED (ICD-790.4) Assessment: Improved Liver Function Test Abnormalities Have Resolved.  Problem # 3:  GLAUCOMA (ICD-365.9) Assessment: Comment Only  Problem # 4:  Hx of AV BLOCK, COMPLETE (ICD-426.0) Assessment: Improved No current cardiovascular symptoms. Patient has appointment to see Dr. Valera Castle in early August for repeat evaluation, also Dr. Graciela Husbands for checkup of his pacemaker.  Problem # 5:  CARCINOMA, PROSTATE, HX OF (ICD-V10.46) Assessment: Comment Only No current genitourinary complaints.  Patient Instructions: 1)  Stop Florastor. 2)  Restart Micro-K. 3)  Continue all other medications. 4)  Report back if problems arise. 5)  The medication list was  reviewed and reconciled.  All changed / newly prescribed medications were explained.  A complete medication list was provided to the patient / caregiver. 6)  Copy sent to : Dr. [name], Dr. Valera Castle and Dr. Ovidio Kin at [facility] surgery.

## 2010-07-03 NOTE — Procedures (Signed)
Summary: 6 MO F/U PC CK   Current Medications (verified): 1)  Alphagan P 0.1 %  Soln (Brimonidine Tartrate) .... One Gtt Ou Three Times A Day 2)  Cosopt 2-0.5 %  Soln (Dorzolamide-Timolol) .... One Gtt Ou Two Times A Day 3)  Carvedilol 6.25 Mg Tabs (Carvedilol) .... Take One Tablet By Mouth Twice A Day 4)  Quinapril Hcl 20 Mg  Tabs (Quinapril Hcl) .... One By Mouth Two Times A Day 5)  Furosemide 20 Mg  Tabs (Furosemide) .... One By Mouth Once Daily 6)  Allopurinol 300 Mg  Tabs (Allopurinol) .... Take 1 Tablet By Mouth Once A Day 7)  Centrum Silver   Tabs (Multiple Vitamins-Minerals) .... One By Mouth Once Daily  Allergies (verified): No Known Drug Allergies   PPM Specifications Following MD:  Sherryl Manges, MD     PPM Vendor:  [employer] Jude     PPM Model Number:  [id number]     PPM Serial Number:  [id number] PPM DOI:  11/23/2004     PPM Implanting MD:  Sherryl Manges, MD  Lead 1    Location: RA     DOI: 05/07/1993     Model #: [id number]     Serial #: [id number]     Status: active Lead 2    Location: RV     DOI: 05/07/1993     Model #: [id number]     Serial #: [id number]     Status: active  Magnet Response Rate:  BOL 98.6 ERI  86.3  Indications:  Syncope/CHB   PPM Follow Up Remote Check?  No Battery Voltage:  2.78 V     Battery Est. Longevity:  5.50 years     Pacer Dependent:  Yes       PPM Device Measurements Atrium  Amplitude: 2.5 mV, Impedance: 318 ohms, Threshold: 1.0 V at 0.8 msec Right Ventricle  Impedance: 388 ohms, Threshold: 0.625 V at 0.5 msec  Episodes MS Episodes:  7     Percent Mode Switch:  <1%     Coumadin:  No  Parameters Mode:  DDDR     Lower Rate Limit:  60     Upper Rate Limit:  110 Paced AV Delay:  180     Sensed AV Delay:  160 Next Remote Date:  09/01/2009     Next Cardiology Appt Due:  12/01/2009 Tech Comments:  RA reprogrammed 2.5@0 .8.  Device function normal.  TTM's with [employer].  ROV 6 months Dr. Graciela Husbands. Altha Harm, LPN  June 07, 2009 12:38 PM

## 2010-07-03 NOTE — Miscellaneous (Signed)
Summary: Plan/[facility]  Plan/[facility]   Imported By: Lester Richardson 09/21/2009 09:16:21  _____________________________________________________________________  External Attachment:    Type:   Image     Comment:   External Document

## 2010-07-03 NOTE — Assessment & Plan Note (Signed)
Summary: Recheck,  dfs   History of Present Illness Visit Type: Follow-up Visit Primary GI MD: Sheryn Bison MD FACP FAGA Primary Provider: [name], MD Requesting Provider: n/a Chief Complaint: Diverticulitis f/u and pt wanted to talk about Dr. Ezzard Standing and CT. Pt denies any GI complaints  History of Present Illness:   77 year old American male with previous diverticulitis requiring partial colectomy. He is resubmitted hospitalized with sepsis probably from cholelithiasis and choledocholithiasis. Originally saw him postop hospitalization with probable C. difficile diarrhea which has responded 2 metronidazole therapy. He currently is asymptomatic in terms of any diarrhea or rectal bleeding.  Workup of his nausea and vomiting, abdominal pain, and sepsis has revealed severe cholelithiasis with common bile duct enlargement and abnormal liver function tests which seem to be resolving. Recent CT scan of the abdomen showed multiple gallstones with a 10 mm common bile duct. He currently is not jaundiced and denies hepatobiliary or general GI problems. His care then complicated by coronary artery disease, pacemaker, hypertension, asthma, and severe glaucoma. Is on multiple cardiac medications, and is followed closely by Dr. [name].   GI Review of Systems      Denies abdominal pain, acid reflux, belching, bloating, chest pain, dysphagia with liquids, dysphagia with solids, heartburn, loss of appetite, nausea, vomiting, vomiting blood, weight loss, and  weight gain.        Denies anal fissure, black tarry stools, change in bowel habit, constipation, diarrhea, diverticulosis, fecal incontinence, heme positive stool, hemorrhoids, irritable bowel syndrome, jaundice, light color stool, liver problems, rectal bleeding, and  rectal pain.    Current Medications (verified): 1)  Alphagan P 0.1 %  Soln (Brimonidine Tartrate) .... One Gtt Ou Three Times A Day 2)  Cosopt 2-0.5 %  Soln  (Dorzolamide-Timolol) .... One Gtt Ou Two Times A Day 3)  Xalatan 0.005 % Soln (Latanoprost) .Marland Kitchen.. 1 Drop in Each Eye At Bedtime 4)  Carvedilol 6.25 Mg Tabs (Carvedilol) .... Take One Tablet By Mouth Twice A Day 5)  Quinapril Hcl 20 Mg  Tabs (Quinapril Hcl) .... One By Mouth Two Times A Day 6)  Furosemide 20 Mg  Tabs (Furosemide) .... One By Mouth Once Daily 7)  Allopurinol 300 Mg  Tabs (Allopurinol) .... Take 1 Tablet By Mouth Once A Day 8)  Centrum Silver   Tabs (Multiple Vitamins-Minerals) .... One By Mouth Once Daily 9)  Florastor 250 Mg Caps (Saccharomyces Boulardii) .... Take One By Mouth Two Times A Day As Directed By Drpatterson... 10)  Micro-K 10 Meq Cr-Caps (Potassium Chloride) .... Take 2 By Mouth Once Daily... 11)  Vitamin D 1000 Unit Tabs (Cholecalciferol) .... Take 1 Cap By Mouth Once Daily...  Allergies (verified): No Known Drug Allergies  Past History:  Past medical, surgical, family and social histories (including risk factors) reviewed for relevance to current acute and chronic problems.  Past Medical History: GLAUCOMA (ICD-365.9) Hx of ASTHMATIC BRONCHITIS, ACUTE (ICD-466.0) HYPERTENSION (ICD-401.9) CORONARY ARTERY DISEASE (ICD-414.00) Hx of AV BLOCK, COMPLETE (ICD-426.0) CARDIAC PACEMAKER IN SITU-ST JUDE-VICTORY 5816 (ICD-V45.01) DIVERTICULOSIS OF COLON WITH HEMORRHAGE (ICD-562.12) GASTROINTESTINAL HEMORRHAGE (ICD-578.9) Hx of SMALL BOWEL OBSTRUCTION (ICD-560.9) INGUINAL HERNIA (ICD-550.90) CARCINOMA, PROSTATE, HX OF (ICD-V10.46) DEGENERATIVE JOINT DISEASE (ICD-715.90) Hx of GOUT (ICD-274.9) HEMATOMA, SUBDURAL (ICD-432.1)  Past Surgical History: Reviewed history from 09/15/2009 and no changes required. Pacemaker Implant S/P bilat cataract surgery S/P tonsillectomy at age 74 S/P subtotal colectomy for diverticular bleed by [facility] 12/03 S/P lipoma removed from left leg 2001 [facility] DrWard at [facility] 2/11 by  [facility] w/ part SBO- did not require  surg...  Family History: Reviewed history from 11/27/2007 and no changes required. No FH of Colon Cancer:  Social History: Reviewed history from 09/15/2009 and no changes required. Married, wife= [place], 54yrs 4 children Alcohol Use - yes wine occ Patient has never smoked.  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  The patient denies allergy/sinus, anemia, anxiety-new, back pain, blood in urine, breast changes/lumps, change in vision, confusion, cough, coughing up blood, depression-new, fainting, fatigue, fever, headaches-new, hearing problems, heart murmur, heart rhythm changes, itching, muscle pains/cramps, night sweats, nosebleeds, shortness of breath, skin rash, sleeping problems, sore throat, swelling of feet/legs, swollen lymph glands, thirst - excessive, urination - excessive, urination changes/pain, urine leakage, vision changes, and voice change.    Vital Signs:  Patient profile:   77 year old male Height:      73 inches Weight:      210 pounds BMI:     27.81 BSA:     2.20 Pulse rate:   74 / minute Pulse rhythm:   regular BP sitting:   128 / 76  (left arm) Cuff size:   regular  Vitals Entered By: Ok Anis CMA (Oct 03, 2009 3:23 PM)  Physical Exam  General:  Well developed, well nourished, no acute distress. Head:  Normocephalic and atraumatic. Eyes:  PERRLA, no icterus.exam deferred to patient's ophthalmologist.   Abdomen:  Soft, nontender and nondistended. No masses, hepatosplenomegaly or hernias noted. Normal bowel sounds. Psych:  Alert and cooperative. Normal mood and affect.   Impression & Recommendations:  Problem # 1:  GALLSTONES (ICD-574.20) Assessment Improved He has multiple gallstones with recent CT scan showing evidence of pericholecystic fluid and border- line common bile duct enlargement. His liver function test have normalized except for mild increase in alkaline  phosphatase.He is asymptomatic at this time, but is high risk for recurrent symptomatic gallbladder problems, cholangitis, and pancreatitis. He has called Dr. Lavonda Jumbo nurse and will see him for surgical evaluation and consultation. He is to call immediately and get an emergency room should he have recurrence of any hepatobiliary problems. His previous antibody-induced diarrhea seemed to have resolved. He is to continue on Florstar probiotic therapy.Consideration for laproscopic cholecystectomy and intraoperative cholangiography and possible ERCP will be entertained per gastroenterology and surgery.  Problem # 2:  TRANSAMINASES, SERUM, ELEVATED (ICD-790.4) Assessment: Improved  Problem # 3:  OLD MYOCARDIAL INFARCTION (ICD-412) Assessment: Comment Only  Problem # 4:  GLAUCOMA (ICD-365.9) Assessment: Improved  Problem # 5:  Hx of AV BLOCK, COMPLETE (ICD-426.0) Assessment: Comment Only  Problem # 6:  CARDIAC PACEMAKER IN SITU-ST JUDE-VICTORY 5816 (ICD-V45.01) Assessment: Deteriorated  Problem # 7:  Hx of SMALL BOWEL OBSTRUCTION (ICD-560.9) Assessment: Improved  Patient Instructions: 1)  Please continue current medications.  2)  Please make an appt to see a surgeon re gallstones. 3)  The medication list was reviewed and reconciled.  All changed / newly prescribed medications were explained.  A complete medication list was provided to the patient / caregiver. 4)  Gallstones brochure given.  5)  Copy sent to : Dr. Ovidio Kin At [facility] and Dr. [name].

## 2010-07-03 NOTE — Letter (Signed)
Summary: [facility]  Dr.David Ezzard Standing   Imported By: Cala Bradford Mesiemore 11/16/2009 07:59:41  _____________________________________________________________________  External Attachment:    Type:   Image     Comment:   External Document

## 2010-07-03 NOTE — Miscellaneous (Signed)
Summary: Physician verbal order/[facility]  Physician verbal order/[facility]   Imported By: Sherian Rein 10/23/2009 14:04:59  _____________________________________________________________________  External Attachment:    Type:   Image     Comment:   External Document

## 2010-07-03 NOTE — Progress Notes (Signed)
Summary: Reporting  Phone Note Outgoing Call   Call placed by: Ashok Cordia RN,  September 27, 2009 10:29 AM Summary of Call: talked with pt.  Pt informed that we are sch appt for him to see Dr. Ezzard Standing.   Pt would like to come in and talk to Dr. Jarold Motto.  States he has some questions.  Pt states he is feeling fine.  No pain at the present time.  First availabel appt to see Dr. Ezzard Standing in not until June.  Dr. Ezzard Standing is out of the office this week but nurse will have him review pt's records and call us back if pt can be worked in sooner. Initial call taken by: Ashok Cordia RN,  September 27, 2009 10:32 AM  Follow-up for Phone Call        SEE ME NEXT WEEK.. Follow-up by: Mardella Layman MD FACG,  September 27, 2009 12:16 PM  Additional Follow-up for Phone Call Additional follow up Details #1::        Appt scheduled.  Pt notified. Additional Follow-up by: Ashok Cordia RN,  September 27, 2009 2:44 PM

## 2010-07-03 NOTE — Progress Notes (Signed)
Summary: triage  Phone Note Call from Patient Call back at Home Phone [phone]   Caller: daughter, Marcha Solders Call For: Dr. Jarold Motto Reason for Call: Talk to Nurse Summary of Call: would like to sch a hospital f/u asap... was hospitalized for abd pain per daughter Initial call taken by: Vallarie Mare,  September 04, 2009 3:54 PM  Follow-up for Phone Call        Given an appt. with Dr.Patterson for tomorrow.Loose stools x1 week-2 to 3 /day.with hosp for sm. bowel obstruction prior to that. Follow-up by: Teryl Lucy RN,  September 04, 2009 4:18 PM

## 2010-07-03 NOTE — Assessment & Plan Note (Signed)
Summary: would like to discuss DM///JJ   Primary Care Provider:  [name], MD  CC:  2 month ROV & add-on for diabetic concern....  History of Present Illness: 77 y/o BM here for a follow up visit... he has multiple medical problems as noted below...   ~  he sees DrPatterson for GI w/ hx of GI bleed from divertics w/ subtotal colectomy in 2003... he is very sensitive to NSAIDs w/ small bowel ulceration (just above the ileo-colonic anastomosis) & bleeding from Aspirin which was stopped in 2008...    ~  September 11, 2009:  he was hosp by [facility], DrTseui, 2/11 for abd pain- part SBO... he was improving on conserv Rx w/ NGtube & had an epis of SOB;  [facility] consulted & ?abn V/Q scan w/ Lovenox Rx;  subseq upper GIBleed (?ngtube trauma) w/ anemia, transfusions, EGD by DrSchooler w/ clots & ulcers in stomach; Rx w/ PPI, Lovenox stopped, etc... eventually tx to SNF for rehab & pt's son took him home for home PT shortly thereafter... he has had some diarrhea & saw DrPatterson 09/05/09 w/ Flagyl & Florastor added- now improved... gradually gaining his strength at home... meds are otherw remarkably similar to when I last saw him in 2010... f/u labs >> incr AlkPhos=[id number]; AbdSonar= GB filled w/ stones; refer to DrPatterson...   ~  Oct 23, 2009:  DrPatterson did CT Abd= thickened GB w/ fluid & stones, mild ductal dil > refered to [facility] for consideration of surg (pending)... otherw improved> diarrhea resolved, breathing stable, BP controlled, no angina/ dizzy/ etc, getting stronger...   ~  January 26, 2010:  he has seen all his specialists & their opinions regarding his GB dis & poss surg>  DrNewman, [facility]- hi risk surg & may need open procedure,  asymptomatic now, continue observation;  DrPatterson, GI- hi risk surg, but hi risk for GB complic, continue obs for now;  DrWall, Cards- hi risk for surg, continue obs for now;  DrKlein- pt stable, pacer OK, continue same meds...  he understands the risks and agrees w/ watchful  waiting... currently asymptomatic, feeling well, etc...   ~  April 06, 2010:  he was recently at his podiatrist office having his toenails trimmed & he mentioned that [name] needed to be checked for DM... the pt became concerned & despite the fact that he is not diabetic & all BS checks in the past have been normal, he didn't want to wait until his next f/u visit in Jan12, so he asked for this work-in appt... we discussed DM & agreed to check BMet (norm- BS=79) & an A1c (5.2)> he is reassured.   Current Problems:   GLAUCOMA (ICD-365.9) - prev followed by Deland Pretty w/ bilat cataract surg & glaucoma on eye 3 diff drops as noted...  HEARING LOSS - son says he has 3 sets of hearing aides but won't wear any of them...  Hx of ASTHMATIC BRONCHITIS, ACUTE (ICD-466.0) - he has never smoked... hx recurrent bronchitic infections over the yrs w/ reactive airway component... no recent prob, and no regular meds required.   HYPERTENSION (ICD-401.9) - on ASA 81mg /d, COREG 6.25Bid, QUINAPRIL 20mg Bid, LASIX 20mg /d... he is fairly regular w/ his meds... BP today = 130/80 & feeling well... he denies HA, visual changes, CP, palipit, dizziness, syncope, change in dyspnea, etc...   CORONARY ARTERY DISEASE (ICD-414.00) - followed by DrWall for Cardiology on above meds... pt has not had a prev cath...  ~  2DEcho 4/05 showed mild conc  LVH and norm LVF- improved from 2002 when EF= 45%...  ~  NuclearStressTest 10/06 showed prior inferoseptal & apical infarct w/ apical AK, w/o ischemia, EF= 48%... no change from prev.  Hx of AV BLOCK, COMPLETE (ICD-426.0), & CARDIAC PACEMAKER IN SITU (ICD-V45.01) - he had a pacemaker placed for complete heart block initially in 1994, & this was exchanged for a new dual chamber pacer: Valene Bors DDDR model [phone] by [facility] 6/06...  ~  seen by DrKlein 1/10 w/ pacer check OK, no further episodes of AFib on his monitor... not a Coumadin candidate due to age, unsteady, hx of GI bleeds, and prev  subdural hematoma.  ~  followed regularly by DrKlein's pacer clinic> stable, doing well, no changes made.  DIVERTICULOSIS OF COLON WITH HEMORRHAGE (ICD-562.12) & GASTROINTESTINAL HEMORRHAGE (ICD-578.9) - he had a subtotal colectomy w/ ileum to distal sigmoid anastomosis in Jul03 by DrNewman...  ~  EGD 8/03 by Dorris Singh was normal...  ~  colonoscopy 1/08 by DrPatterson showed inflammed mucosa in sm bowel prox to the ileo-colonic anastomosis, few divertics in remaining distal colon, no polyps etc...   ~  f/u colonoscopy 5/09 showed prev colectomy- anastomosis granular & bleeding ?Crohn's... also had divertics & hems... Rx'd w/ Lialda.  Hx of SMALL BOWEL OBSTRUCTION (ICD-560.9)    **see [id number] Hospitalization by [facility]**  INGUINAL HERNIA (ICD-550.90) - he has bilat fat containing inguinal hernias seen on CT Abd in 2008...   GALLSTONES (ICD-574.20) - Sonar 4/11 showed mult gallstones filling the gallbladder, and CT Abd 4/11 showed mild extrahep biliary ductal dilatation... he was evaluated by DrPatterson/ GI, DrNewman/ Surg, DrWall/ Cardfs> all agreed that risk was hi & to hold off on surg unless absolutely necessary...  CARCINOMA, PROSTATE, HX OF (ICD-V10.46) - diagnosed in 1993 & treated w/ XRT... PSA initially  ~29 and dropped to  ~1 after XRT & it has remained there ever since... followed by DrOttelin- also has BPH, min obstructive symptoms, bilat hydroceles & some benign renal cysts...  ~  labs 3/07 by Urology showed PSA= 1.19  ~  labs here 1/10 showed PSA= 1.37  ~  5/11: he reports f/u DrOttelin w/ incontinence symptoms Rx'd SANCTURA XR 60mg /d & improved, so he stopped this on his own & states that he is doingh satis now...  DEGENERATIVE JOINT DISEASE (ICD-715.90) & Hx of GOUT (ICD-274.9) - he has hx of DJD and Gout treated w/ TRAMADOL Prn & ALLOPURINOL 300mg /d... also takes MVI & VIT D 1000 u daily...  HEMATOMA, SUBDURAL (ICD-432.1) - he had a burr hole placed for subdural hematoma in the  past...    Preventive Screening-Counseling & Management  Alcohol-Tobacco     Smoking Status: never  Caffeine-Diet-Exercise     Does Patient Exercise: yes  Allergies (verified): No Known Drug Allergies  Comments:  Nurse/Medical Assistant: The patient's medications were reviewed with the patient's parent and were updated in the Medication List.  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) CARDIAC PACEMAKER IN SITU-ST JUDE-VICTORY 5816 (ICD-V45.01) DIVERTICULOSIS OF COLON WITH HEMORRHAGE (ICD-562.12) GASTROINTESTINAL HEMORRHAGE (ICD-578.9) Hx of SMALL BOWEL OBSTRUCTION (ICD-560.9) INGUINAL HERNIA (ICD-550.90) GALLSTONES (ICD-574.20) CARCINOMA, PROSTATE, HX OF (ICD-V10.46) DEGENERATIVE JOINT DISEASE (ICD-715.90) Hx of GOUT (ICD-274.9) HEMATOMA, SUBDURAL (ICD-432.1)  Past Surgical History: Pacemaker Implant S/P bilat cataract surgery S/P tonsillectomy at age 56 S/P subtotal colectomy for diverticular bleed by [facility] 12/03 S/P lipoma removed from left leg 2001  [facility]Ward [facility] 2/11 by [facility] w/ part SBO- did not require surg  Family History: Reviewed history from 11/27/2007 and no changes required. No FH of Colon Cancer:  Social History: Reviewed history from 01/26/2010 and no changes required. Married, wife= [place], 25yrs 4 children Alcohol Use - yes wine occ Patient has never smoked.  Occupation: Retired  Review of Systems      See HPI       The patient complains of decreased hearing, dyspnea on exertion, muscle weakness, and difficulty walking.  The patient denies anorexia, fever, weight loss, weight gain, vision loss, hoarseness, chest pain, syncope, peripheral edema, prolonged cough, headaches, hemoptysis, abdominal pain, melena, hematochezia, severe indigestion/heartburn, hematuria,  incontinence, suspicious skin lesions, transient blindness, depression, unusual weight change, abnormal bleeding, enlarged lymph nodes, and angioedema.    Vital Signs:  Patient profile:   77 year old male Height:      73 inches Weight:      221.25 pounds O2 Sat:      96 % on Room air Temp:     97.8 degrees F oral Pulse rate:   64 / minute BP sitting:   130 / 80  (left arm) Cuff size:   regular  Vitals Entered By: Randell Loop CMA (April 06, 2010 9:45 AM)  O2 Sat at Rest %:  96 O2 Flow:  Room air CC: 2 month ROV & add-on for diabetic concern... Is Patient Diabetic? No Pain Assessment Patient in pain? no      Comments no changes in meds today   Physical Exam  Additional Exam:  WD, sl overweight, 77 y/o BM in NAD...  GENERAL:  Alert & oriented; pleasant & cooperative... HEENT:  [place]/AT, EOM-full, PERRLA, EACs-clear (HOH), TMs-wnl, NOSE-clear discharge , THROAT-clear & wnl. NECK:  Supple w/ decrROM; no JVD; normal carotid impulses w/o bruits; no thyromegaly or nodules palpated; no lymphadenopathy. CHEST:  Clear to P & A; without wheezes/ rales/ or rhonchi heard... HEART:  Regular Rhythm; without murmurs/ rubs/ or gallops detected... ABDOMEN:  Scar of prev surg, soft & nontender; normal bowel sounds; no organomegaly or masses palpated...  EXT:  mod arthritic changes; no varicose veins/ +venous insuffic/ tr edema. NEURO:  CN's intact; motor testing normal; sensory testing normal; gait is abnormal & balance only fair... DERM:  No lesions noted; no rash etc...    EKG  Procedure date:  04/06/2010  Findings:      BMP (METABOL)   Sodium                    142 mEq/L                   135-145   Potassium                 4.0 mEq/L                   3.5-5.1   Chloride                  109 mEq/L                   96-112   Carbon Dioxide            28 mEq/L                    19-32   Glucose  79 mg/dL                    84-13   BUN                       16 mg/dL                     2-44   Creatinine                1.1 mg/dL                   0.1-0.2   Calcium                   9.1 mg/dL                   7.2-53.6   GFR                       81.22 mL/min                >60  Hemoglobin A1C (A1C)   Hemoglobin A1C            5.2 %                       4.6-6.5   Impression & Recommendations:  Problem # 1:  R/O DIABETES MELLITUS (ICD-250.00) He was quite concerned over the podiatrists mentioning that he mioght be diabetic... I am glad that we can reassure him that he is in NO WAY diabetic>  good news!!! His updated medication list for this problem includes:    Aspirin 81 Mg Tabs (Aspirin) .Marland Kitchen... Take 1 tablet by mouth once a day    Quinapril Hcl 20 Mg Tabs (Quinapril hcl) ..... One by mouth two times a day  Orders: TLB-BMP (Basic Metabolic Panel-BMET) (80048-METABOL) TLB-A1C / Hgb A1C (Glycohemoglobin) (83036-A1C)  Problem # 2:  HYPERTENSION (ICD-401.9) Controlled>  same meds. His updated medication list for this problem includes:    Carvedilol 6.25 Mg Tabs (Carvedilol) .Marland Kitchen... Take one tablet by mouth twice a day    Quinapril Hcl 20 Mg Tabs (Quinapril hcl) ..... One by mouth two times a day    Furosemide 20 Mg Tabs (Furosemide) ..... One by mouth once daily  Problem # 3:  CORONARY ARTERY DISEASE (ICD-414.00) Stable w/o angina... His updated medication list for this problem includes:    Aspirin 81 Mg Tabs (Aspirin) .Marland Kitchen... Take 1 tablet by mouth once a day    Carvedilol 6.25 Mg Tabs (Carvedilol) .Marland Kitchen... Take one tablet by mouth twice a day    Quinapril Hcl 20 Mg Tabs (Quinapril hcl) ..... One by mouth two times a day    Furosemide 20 Mg Tabs (Furosemide) ..... One by mouth once daily  Problem # 4:  CARDIAC PACEMAKER IN SITU-ST JUDE-VICTORY 5816 (ICD-V45.01) Pacer followed by DrKlein & stable...  Problem # 5:  Hx of SMALL BOWEL OBSTRUCTION (ICD-560.9) GI stable at present w/o recurrent SBO etc...  Problem # 6:  GALLSTONES (ICD-574.20) He remains  asymptomatic at [facility] on watchful waiting protocol...  Problem # 7:  CARCINOMA, PROSTATE, HX OF (ICD-V10.46) Followed by DrOttelin & doing well...  Problem # 8:  Other medical problems as noted...  Complete Medication List: 1)  Alphagan P 0.1 % Soln (Brimonidine tartrate) .... One gtt ou three times a day 2)  Cosopt 2-0.5 % Soln (  Dorzolamide-timolol) .... One gtt ou two times a day 3)  Xalatan 0.005 % Soln (Latanoprost) .Marland Kitchen.. 1 drop in each eye at bedtime 4)  Aspirin 81 Mg Tabs (Aspirin) .... Take 1 tablet by mouth once a day 5)  Carvedilol 6.25 Mg Tabs (Carvedilol) .... Take one tablet by mouth twice a day 6)  Quinapril Hcl 20 Mg Tabs (Quinapril hcl) .... One by mouth two times a day 7)  Furosemide 20 Mg Tabs (Furosemide) .... One by mouth once daily 8)  Allopurinol 300 Mg Tabs (Allopurinol) .... Take 1 tablet by mouth once a day 9)  Centrum Silver Tabs (Multiple vitamins-minerals) .... One by mouth once daily 10)  Vitamin D 1000 Unit Tabs (Cholecalciferol) .... Take 1 cap by mouth once daily...  Patient Instructions: 1)  Today we updated your med list- see below.... 2)  We reviewed all your labs going back several yrs and there isn't a single blood sugar test that was elevated (no signs of diabetes)... 3)  We will check the definiitive A1C test today to be sure there is no hint of DM in your system... 4)  please call the "phone tree" in a few days for your lab results.Marland KitchenMarland Kitchen  5)  Call for any problems.Marland KitchenMarland Kitchen 6)  Let's recheck you after 1st of the yr & run your FASTING blood work at that time.Marland KitchenMarland Kitchen

## 2010-07-03 NOTE — Assessment & Plan Note (Signed)
Summary: F/U ultrasound/dfs   History of Present Illness Visit Type: Follow-up Visit Primary GI MD: Sheryn Bison MD FACP FAGA Primary Provider: [name], MD Requesting Provider: n/a Chief Complaint: f/u abnormal abdominal u/s History of Present Illness:   77 year old African American male followed for many years because of recurrent diverticulitis with previous partial colectomy by Dr. Ovidio Kin. He reasons hospitalized with severe abdominal pain, nausea vomiting, sepsis, and upper GI bleeding probably related to traumatic passage of nasogastric tube. He had a long and involved hospitalization and is on multiple antibiotics. He presented to my office with suspected C. difficile colitis and responded very well to p.o. metronidazole and probiotic therapy. In fact, he currently is asymptomatic and specifically denies abdominal pain, diarrhea, thick colored stools, dark urine, icterus, fever chills. His appetite is returning he is eating well. On review of his record I cannot see recent previous symptomatic gallstones were previously documented pancreatitis.  Ultrasound abdomen recently showed a gallbladder packed full of gallstones with borderline common bile duct enlargement. Liver function tests showed an elevated alkaline phosphatase and mild hyperbilirubinemia.   GI Review of Systems      Denies abdominal pain, acid reflux, belching, bloating, chest pain, dysphagia with liquids, dysphagia with solids, heartburn, loss of appetite, nausea, vomiting, vomiting blood, weight loss, and  weight gain.      Reports diverticulosis.     Denies anal fissure, black tarry stools, change in bowel habit, constipation, diarrhea, fecal incontinence, heme positive stool, hemorrhoids, irritable bowel syndrome, jaundice, light color stool, liver problems, rectal bleeding, and  rectal pain. Preventive Screening-Counseling & Management  Alcohol-Tobacco     Smoking Status: never      Drug Use:  no.      Current Medications (verified): 1)  Alphagan P 0.1 %  Soln (Brimonidine Tartrate) .... One Gtt Ou Three Times A Day 2)  Cosopt 2-0.5 %  Soln (Dorzolamide-Timolol) .... One Gtt Ou Two Times A Day 3)  Xalatan 0.005 % Soln (Latanoprost) .Marland Kitchen.. 1 Drop in Each Eye At Bedtime 4)  Carvedilol 6.25 Mg Tabs (Carvedilol) .... Take One Tablet By Mouth Twice A Day 5)  Quinapril Hcl 20 Mg  Tabs (Quinapril Hcl) .... One By Mouth Two Times A Day 6)  Furosemide 20 Mg  Tabs (Furosemide) .... One By Mouth Once Daily 7)  Allopurinol 300 Mg  Tabs (Allopurinol) .... Take 1 Tablet By Mouth Once A Day 8)  Centrum Silver   Tabs (Multiple Vitamins-Minerals) .... One By Mouth Once Daily 9)  Flagyl 250 Mg Tabs (Metronidazole) .... One Tablet By Mouth Three Times A Day 10)  Florastor 250 Mg Caps (Saccharomyces Boulardii) .... Take One By Mouth Two Times A Day As Directed By Drpatterson... 11)  Micro-K 10 Meq Cr-Caps (Potassium Chloride) .... Take 2 By Mouth Once Daily... 12)  Vitamin D 1000 Unit Tabs (Cholecalciferol) .... Take 1 Cap By Mouth Once Daily...  Allergies (verified): No Known Drug Allergies  Past History:  Past medical, surgical, family and social histories (including risk factors) reviewed for relevance to current acute and chronic problems.  Past Medical History: Reviewed history from 09/11/2009 and no changes required.  GLAUCOMA (ICD-365.9) Hx of ASTHMATIC BRONCHITIS, ACUTE (ICD-466.0) HYPERTENSION (ICD-401.9) CORONARY ARTERY DISEASE (ICD-414.00) Hx of AV BLOCK, COMPLETE (ICD-426.0) CARDIAC PACEMAKER IN SITU-ST JUDE-VICTORY 5816 (ICD-V45.01) DIVERTICULOSIS OF COLON WITH HEMORRHAGE (ICD-562.12) GASTROINTESTINAL HEMORRHAGE (ICD-578.9) Hx of SMALL BOWEL OBSTRUCTION (ICD-560.9) INGUINAL HERNIA (ICD-550.90) CARCINOMA, PROSTATE, HX OF (ICD-V10.46) DEGENERATIVE JOINT DISEASE (ICD-715.90) Hx of GOUT (ICD-274.9) HEMATOMA,  SUBDURAL (ICD-432.1)  Past Surgical History: Pacemaker Implant S/P bilat  cataract surgery S/P tonsillectomy at age 56 S/P subtotal colectomy for diverticular bleed by [facility] 12/03 S/P lipoma removed from left leg 2001 [facility] DrWard [facility] 2/11 by [facility] w/ part SBO- did not require surg...  Family History: Reviewed history from 11/27/2007 and no changes required. No FH of Colon Cancer:  Social History: Reviewed history from 09/22/2008 and no changes required. Married, wife= [place], 50yrs 4 children Alcohol Use - yes wine occ Patient has never smoked.  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  Review of Systems  The patient denies allergy/sinus, anemia, anxiety-new, arthritis/joint pain, back pain, blood in urine, breast changes/lumps, change in vision, confusion, cough, coughing up blood, depression-new, fainting, fatigue, fever, headaches-new, hearing problems, heart murmur, heart rhythm changes, itching, menstrual pain, muscle pains/cramps, night sweats, nosebleeds, pregnancy symptoms, shortness of breath, skin rash, sleeping problems, sore throat, swelling of feet/legs, swollen lymph glands, thirst - excessive , urination - excessive , urination changes/pain, urine leakage, vision changes, and voice change.    Vital Signs:  Patient profile:   77 year old male Height:      73 inches Weight:      210 pounds BSA:     2.20 Pulse rate:   72 / minute Pulse rhythm:   regular BP sitting:   130 / 86  (left arm)  Vitals Entered By: Hortense Ramal CMA Duncan Dull) (September 15, 2009 8:50 AM) CC: f/u abnormal abdominal u/s   Physical Exam  General:  Well developed, well nourished, no acute distress.healthy appearing.   Head:  Normocephalic and atraumatic. Eyes:  PERRLA, no icterus. Abdomen:  Soft, nontender and nondistended. No masses, hepatosplenomegaly or hernias noted. Normal bowel sounds. Psych:  Alert and cooperative. Normal mood and affect.   Impression & Recommendations:  Problem # 1:  GALLSTONES (ICD-574.20) Assessment Improved Repeat  liver function test order along with MRCP exam of his biliary system. I suspect his recent severe nausea vomiting, dehydration, was all related to symptomatic cholelithiasis and possible cholangitis. His surgeon is Dr. Ovidio Kin and I will probably seek his opinion once MRCP has been completed. I've also discussed his case with Dr. Emilee Hero his primary care physician, and the patient's son who is in attendance. He is to complete his metronidazole antibiotic course. Orders: TLB-Hepatic/Liver Function Pnl (80076-HEPATIC)  Problem # 2:  TRANSAMINASES, SERUM, ELEVATED (ICD-790.4) Assessment: Unchanged Repeat liver function test ordered.  Problem # 3:  DIARRHEA (ICD-787.91) Assessment: Improved Complete metronidazole with continuation of Florstar for one month.  Problem # 4:  OLD MYOCARDIAL INFARCTION (ICD-412) He denies symptomatic  angina or symptoms of congestive heart failure at this time is continue all of his cardiac medications as previously outlined. Overall, he is in fairly good health for age 89.  Problem # 5:  GLAUCOMA (ICD-365.9) Assessment: Comment Only  Problem # 6:  CARDIAC PACEMAKER IN SITU-ST JUDE-VICTORY 5816 (ICD-V45.01) Assessment: Comment Only  Problem # 7:  DIVERTICULOSIS OF COLON WITH HEMORRHAGE (ICD-562.12) Assessment: Improved  Problem # 8:  CARCINOMA, PROSTATE, HX OF (ICD-V10.46) Assessment: Comment Only  Problem # 9:  Hx of GOUT (ICD-274.9) Assessment: Comment Only  Patient Instructions: 1)  You are scheduled for a MRCP. 2)  Please go to the basement for lab work. 3)  Please continue current medications.  4)  Florastor daily for 2 weeks. 5)  The medication list was reviewed and reconciled.  All changed / newly prescribed medications were explained.  A complete medication list was provided to the patient / caregiver. 6)  Copy sent to : Dr. [name] and Dr. Ovidio Kin at [facility] 7)  Please continue current medications.   Appended  Document: F/U ultrasound/dfs    Clinical Lists Changes  Orders: Added new Referral order of MRCP (MRCP) - Signed

## 2010-07-03 NOTE — Cardiovascular Report (Signed)
Summary: Office Visit   Office Visit   Imported By: Roderic Ovens 01/24/2010 15:47:32  _____________________________________________________________________  External Attachment:    Type:   Image     Comment:   External Document

## 2010-07-03 NOTE — Assessment & Plan Note (Signed)
Summary: loose stools x1 wk/post sm bowel obst.   History of Present Illness Primary GI MD: Sheryn Bison MD FACP FAGA Primary Provider: Lalla Brothers Requesting Provider: n/a Chief Complaint: Post hospital for bowel obstruction. Pt noticed a change in his bowels on Saturday. Pt started having watery loos bowels. Denies abd pain, fever, or rectal bleeding. History of Present Illness:   Very Nice but complicated 77 year old American gentleman recently hospitalized in February with small bowel obstruction related to previous surgery for complicated diverticulitis. Apparently there was extreme difficulty with passing NG tube, and the patient subsequently had upper GI bleeding and endoscopy which showed gastric ulcerations. He was transfused and was apparently very unstable with multiple cardiovascular issues. Since his discharge the hospital he said progressive diarrhea with fecal incontinence a vague lower abdominal discomfort. He denies use of sorbitol fructose his diet or recent antibiotic use although he apparently was on multiple antibiotics during his hospitalization because of sepsis. Patient was acutely ill and had to be intubated, transfused, nasogastric tube, and had mild acute renal failure, shock liver, has had previous complete heart block with pacemaker implantation. He also has had previous surgery for prostate cancer and suffers from gouty arthritis and glaucoma.  He comes mild today with his son for evaluation of his diarrhea. Had seen him for greater than 20 years with recurrent diverticulitis and IBS. He currently denies fever, chills, nausea vomiting, upper GI or hepatobiliary complaints. Is on multiple medications listed and reviewed his chart but apparently is not on PPI therapy at this time.primary care physician is Dr. Illene Regulus his cardiologist is Dr. Sherryl Manges.   GI Review of Systems      Denies acid reflux, belching, bloating, chest pain, dysphagia with liquids,  dysphagia with solids, heartburn, loss of appetite, nausea, vomiting, vomiting blood, weight loss, and  weight gain.      Reports change in bowel habits and  diarrhea.     Denies anal fissure, black tarry stools, constipation, diverticulosis, fecal incontinence, heme positive stool, hemorrhoids, irritable bowel syndrome, jaundice, light color stool, liver problems, rectal bleeding, and  rectal pain.    Current Medications (verified): 1)  Alphagan P 0.1 %  Soln (Brimonidine Tartrate) .... One Gtt Ou Three Times A Day 2)  Cosopt 2-0.5 %  Soln (Dorzolamide-Timolol) .... One Gtt Ou Two Times A Day 3)  Carvedilol 6.25 Mg Tabs (Carvedilol) .... Take One Tablet By Mouth Twice A Day 4)  Quinapril Hcl 20 Mg  Tabs (Quinapril Hcl) .... One By Mouth Two Times A Day 5)  Furosemide 20 Mg  Tabs (Furosemide) .... One By Mouth Once Daily 6)  Allopurinol 300 Mg  Tabs (Allopurinol) .... Take 1 Tablet By Mouth Once A Day 7)  Centrum Silver   Tabs (Multiple Vitamins-Minerals) .... One By Mouth Once Daily  Allergies (verified): No Known Drug Allergies  Past History:  Past medical, surgical, family and social histories (including risk factors) reviewed for relevance to current acute and chronic problems.  Past Medical History: Reviewed history from 05/11/2009 and no changes required. GLAUCOMA (ICD-365.9) - prev followed by Deland Pretty w/ bilat cataract surg & glaucoma on eye drops.  Hx of ASTHMATIC BRONCHITIS, ACUTE (ICD-466.0) - he has never smoked... hx recurrent bronchitic infections over the yrs w/ reactive airway component...    HYPERTENSION (ICD-401.9) - on COREG 6.25Bid, QUINAPRIL 20mg Bid, LASIX 20mg /d... he is fairly regular w/ his meds, but not taking Lasix daily per above...   CORONARY ARTERY DISEASE (ICD-414.00) - followed by Se[facility] Spates  for Cardiology (last seen 2/10)... pt has not had a prev cath...  ~  NuclearStressTest 10/06 showed prior inferoseptal & apical infarct w/ apical AK, w/o ischemia, EF=  48%... no change from prev.  CHRONIC SYSTOLIC HEART FAILURE (ICD-428.22)  ~  2DEcho 4/05 showed mild conc LVH and norm LVF- improved from 2002 when EF= 45%...  Hx of AV BLOCK, COMPLETE (ICD-426.0), & CARDIAC PACEMAKER IN SITU (ICD-V45.01) - he had a pacemaker placed for complete heart block initially in 1994, & this was exchanged for a new dual chamber pacer: Valene Bors DDDR model [id number] by [facility] 6/06...  ~  last seen by DrKlein 1/10 w/ pacer check OK, no further episodes of AFib on his monitor... not a Coumadin candidate due to age, unsteady, hx of GI bleeds, and prev subdural hematoma...  DIVERTICULOSIS OF COLON WITH HEMORRHAGE (ICD-562.12) & GASTROINTESTINAL HEMORRHAGE (ICD-578.9) - he had a subtotal colectomy w/ ileum to distal sigmoid anastomosis in Jul03 by DrNewman...  ~  last EGD 8/03 by Dorris Singh was normal...  ~  colonoscopy 1/08 by DrPatterson showed inflammed mucosa in sm bowel prox to the ileo-colonic anastomosis, few divertics in remaining distal colon, no polyps etc...   ~  f/u colonoscopy 5/09 showed prev colectomy- anastomosis granular & bleeding ?Crohn's... also had divertics & hems... Rx'd w/ Lialda.  INGUINAL HERNIA (ICD-550.90) -  Past Surgical History: Reviewed history from 09/22/2008 and no changes required. S/P bilat cataract surgery S/P tonsillectomy at age 24 S/P subtotal colectomy for diverticular bleed by [facility] 12/03 S/P lipoma [facility] from left leg 2001 [facility]  Family History: Reviewed history from 11/27/2007 and no changes required. No FH of Colon Cancer:  Social History: Reviewed history from 09/22/2008 and no changes required. Married, wife= Somers, 46yrs 4 children Patient is a former smoker.  Alcohol Use - yes wine occ  Review of Systems       The patient complains of back pain, confusion, and fatigue.    Vital Signs:  Patient profile:   77 year old male Height:      73 inches Weight:      209.25 pounds Pulse rate:    64 / minute Pulse rhythm:   regular BP sitting:   124 / 78  (left arm) Cuff size:   regular  Vitals Entered By: Christie Nottingham CMA Duncan Dull) (September 05, 2009 2:23 PM)  Physical Exam  General:  very elderly appearing male in no acute distress mildly dehydrated but with normal vital signs. Head:  Normocephalic and atraumatic. Eyes:  PERRLA, no icterus.exam deferred to patient's ophthalmologist.   Lungs:  Clear throughout to auscultation. Heart:  Regular rate and rhythm; no murmurs, rubs,  or bruits. Abdomen:  Soft, nontender and nondistended. No masses, hepatosplenomegaly or hernias noted. Normal bowel sounds. Rectal:  No rectal tone noted. I cannot appreciate a perirectal rash, seizures, or fistulae. There is soft yellowish liquid stool in the rectal vault which is guaiac-negative. Prostate:  .normal size prostate.   Extremities:  No clubbing, cyanosis, edema or deformities noted. Neurologic:  Alert and  oriented x4;  grossly normal neurologically. Psych:  Alert and cooperative. Normal mood and affect.   Impression & Recommendations:  Problem # 1:  DIARRHEA (ICD-787.91) Assessment Deteriorated Probable C. difficile colitis from recent prolonged hospitalization and multiple antibiotic use. He also has evidence of fecal incontinence he had almost no rectal tone. I have started him on metronidazole 250 mg t.i.d. along with probiotics in the form of Florstar twice  a day, have asked his son to liberalize his p.o. fluids and will repeat his laboratory parameters. He may need followup flexible sigmoidoscopy exam depending on his clinical course. Orders: TLB-BMP (Basic Metabolic Panel-BMET) (80048-METABOL) TLB-Hepatic/Liver Function Pnl (80076-HEPATIC) TLB-CBC Platelet - w/Differential (85025-CBCD) TLB-TSH (Thyroid Stimulating Hormone) (84443-TSH) TLB-Ferritin (82728-FER) TLB-B12, Serum-Total ONLY (40981-X91) TLB-Folic Acid (Folate) (82746-FOL) TLB-Iron, (Fe) Total (83540-FE) TLB-IBC Pnl  (Iron/FE;Transferrin) (83550-IBC)  Problem # 2:  GASTROINTESTINAL HEMORRHAGE (ICD-578.9) Assessment: Improved Stool Today is guaiac negative and he denies upper gastrointestinal problems. On speaking with his family, there is some question to whether or not his recent GI bleed was related to nasogastric tube trauma. Other considerations would be that he had ischemic damage to his upper GI tract associated with his sepsis and shock. He has no evidence this time with continued small bowel obstruction. Is not on any anticoagulants at this time, and I will send this corresponds to doctors Graciela Husbands and Dr. Kriste Basque 4 review to see if he needs to restart Plavix. Repeat CBC is pending. Previous abdominal surgery has been by Dr. Ovidio Kin. He also has been encouraged to followup with primary care which I do not think his been accomplished since his hospitalization.  Problem # 3:  OLD MYOCARDIAL INFARCTION (ICD-412) Assessment: Comment Only  Problem # 4:  CARDIAC PACEMAKER IN SITU-ST JUDE-VICTORY 5816 (ICD-V45.01) Assessment: Comment Only  Problem # 5:  CORONARY ARTERY DISEASE (ICD-414.00) Assessment: Comment Only  Problem # 6:  Hx of ASTHMATIC BRONCHITIS, ACUTE (ICD-466.0) Assessment: Comment Only  Patient Instructions: 1)  Pick up your prescriptions from your pharmacy.  2)  Get your labs drawn today in the basement.  3)  Take Imodium every 12 hours as needed. 4)  Start Florastor one tablet by mouth two times a day. 5)  Copy sent to : Dr. [name], MD,Dr. Sherryl Manges in cardiology and Dr. Ovidio Kin in surgery 6)  The medication list was reviewed and reconciled.  All changed / newly prescribed medications were explained.  A complete medication list was provided to the patient / caregiver. 7)  Metronidazole 250 mg t.i.d. for 2 weeks and call for clinical response and we will ascertain need for followup with GI. and primary care 8)  Advised to stick with a low residue diet  avoiding food that can  irritate bowel (see handout).  Prescriptions: FLAGYL 250 MG TABS (METRONIDAZOLE) one tablet by mouth three times a day  #42 x 0   Entered by:   Ashok Cordia RN   Authorized by:   Mardella Layman MD [facility]   Signed by:   Ashok Cordia RN on 09/05/2009   Method used:   Electronically to        [facility] [phone]* (retail)       [address]       [place], [place]  [address]       Ph: [phone] or [phone]       Fax: [phone]   RxID:   [id number]

## 2010-07-03 NOTE — Assessment & Plan Note (Signed)
Summary: F6M/ANAS   Visit Type:  6 mo f/u Primary Provider:  Lorin Picket Nadel,MD  CC:  no cardiac complaints today.  History of Present Illness: Mr Bouillon returns today for evaluation and management his history of complete heart block presenting as syncope, history of permanent pacemaker, hypertension, history of subclinical coronary artery disease found on stress Myoview in 2006, good LV function.  He is doing remarkably well with no symptoms of syncope, presyncope, chest pain or shortness of breath. He is now 77 years of age and Gatorade he celebrated his 77th anniversary.  He has no bleeding diathesis or propensities  Current Medications (verified): 1)  Alphagan P 0.1 %  Soln (Brimonidine Tartrate) .... One Gtt Ou Three Times A Day 2)  Cosopt 2-0.5 %  Soln (Dorzolamide-Timolol) .... One Gtt Ou Two Times A Day 3)  Carvedilol 6.25 Mg Tabs (Carvedilol) .... Take One Tablet By Mouth Twice A Day 4)  Quinapril Hcl 20 Mg  Tabs (Quinapril Hcl) .... One By Mouth Two Times A Day 5)  Furosemide 20 Mg  Tabs (Furosemide) .... One By Mouth Once Daily 6)  Allopurinol 300 Mg  Tabs (Allopurinol) .... Take 1 Tablet By Mouth Once A Day 7)  Centrum Silver   Tabs (Multiple Vitamins-Minerals) .... One By Mouth Once Daily 8)  Aspirin 81 Mg Tbec (Aspirin) .... Take One Tablet By Mouth Daily  Allergies (verified): No Known Drug Allergies  Clinical Reports Reviewed:  Nuclear Study:  11/05/1993:  PERSANTINE THALLIUM STRESS TEST OVERALL IMPRESSION:  Persantine Thallium myocardial perfusion images revealing evidence for inferior apical and inferior septal myocardial infarction with evidence of ischemia.  Underlying paced rhythm.  Charlton Haws, MD   Review of Systems       negative other than history of present illness  Vital Signs:  Patient profile:   77 year old male Height:      73 inches Weight:      232 pounds Pulse rate:   64 / minute Pulse rhythm:   regular BP sitting:   116 / 80  (left  arm) Cuff size:   large  Vitals Entered By: Danielle Rankin, CMA (July 07, 2009 11:05 AM)  Physical Exam  General:  Well developed, well nourished, in no acute distress. Head:  normocephalic and atraumatic Eyes:  PERRLA/EOM intact; conjunctiva and lids normal. Neck:  Neck supple, no JVD. No masses, thyromegaly or abnormal cervical nodes. Chest Kosinski:  no deformities or breast masses noted Lungs:  Clear bilaterally to auscultation and percussion. Heart:  regular rate and rhythm Msk:  decreased ROM.   Pulses:  pulses normal in all 4 extremities Extremities:  trace left pedal edema and trace right pedal edema.   Neurologic:  Alert and oriented x 3. Skin:  Intact without lesions or rashes. Psych:  Normal affect.   EKG  Procedure date:  07/07/2009  Findings:      normal sinus rhythm with ventricular pacing, AV pacing with the mag  PPM Specifications Following MD:  Sherryl Manges, MD     PPM Vendor:  [employer]     PPM Model Number:  [id number]     PPM Serial Number:  [id number] PPM DOI:  11/23/2004     PPM Implanting MD:  Sherryl Manges, MD  Lead 1    Location: [facility]     DOI: 05/07/1993     Model #: [id number]     Serial #: [id number]     Status: active Lead 2    Location: [facility]  DOI: 05/07/1993     Model #: [id number]     Serial #: [id number]     Status: active  Magnet Response Rate:  BOL 98.6 ERI  86.3  Indications:  Syncope/CHB   PPM Follow Up Pacer Dependent:  Yes      Episodes Coumadin:  No  Parameters Mode:  DDDR     Lower Rate Limit:  60     Upper Rate Limit:  110 Paced AV Delay:  180     Sensed AV Delay:  160  Impression & Recommendations:  Problem # 1:  Hx of AV BLOCK, COMPLETE (ICD-426.0) Assessment Unchanged  His updated medication list for this problem includes:    Carvedilol 6.25 Mg Tabs (Carvedilol) .Marland Kitchen... Take one tablet by mouth twice a day    Quinapril Hcl 20 Mg Tabs (Quinapril hcl) ..... One by mouth two times a day    Aspirin 81 Mg Tbec (Aspirin) .Marland Kitchen... Take one tablet by mouth  daily  Problem # 2:  CARDIAC PACEMAKER IN SITU-ST JUDE-VICTORY 5816 (ICD-V45.01) Assessment: Unchanged  Problem # 3:  CORONARY ARTERY DISEASE (ICD-414.00) Assessment: Unchanged  His updated medication list for this problem includes:    Carvedilol 6.25 Mg Tabs (Carvedilol) .Marland Kitchen... Take one tablet by mouth twice a day    Quinapril Hcl 20 Mg Tabs (Quinapril hcl) ..... One by mouth two times a day    Aspirin 81 Mg Tbec (Aspirin) .Marland Kitchen... Take one tablet by mouth daily  Orders: EKG w/ Interpretation (93000)  Problem # 4:  HYPERTENSION (ICD-401.9) Assessment: Improved  His updated medication list for this problem includes:    Carvedilol 6.25 Mg Tabs (Carvedilol) .Marland Kitchen... Take one tablet by mouth twice a day    Quinapril Hcl 20 Mg Tabs (Quinapril hcl) ..... One by mouth two times a day    Furosemide 20 Mg Tabs (Furosemide) ..... One by mouth once daily    Aspirin 81 Mg Tbec (Aspirin) .Marland Kitchen... Take one tablet by mouth daily  Problem # 5:  OLD MYOCARDIAL INFARCTION (ICD-412) Assessment: Unchanged  His updated medication list for this problem includes:    Carvedilol 6.25 Mg Tabs (Carvedilol) .Marland Kitchen... Take one tablet by mouth twice a day    Quinapril Hcl 20 Mg Tabs (Quinapril hcl) ..... One by mouth two times a day    Aspirin 81 Mg Tbec (Aspirin) .Marland Kitchen... Take one tablet by mouth daily  Patient Instructions: 1)  Your physician recommends that you schedule a follow-up appointment in: 6 MONTHS WITH DR Westervelt 2)  Your physician has recommended you make the following change in your medication:  3)  START TAKING ASPIRIN 81 MG EVERY DAY

## 2010-07-03 NOTE — Miscellaneous (Signed)
Summary: Episode Summary/[facility]  Episode Summary/[facility]   Imported By: Sherian Rein 10/31/2009 13:29:47  _____________________________________________________________________  External Attachment:    Type:   Image     Comment:   External Document

## 2010-07-03 NOTE — Miscellaneous (Signed)
Summary: Order/[facility]  Order/[facility]   Imported By: Sherian Rein 10/03/2009 09:39:44  _____________________________________________________________________  External Attachment:    Type:   Image     Comment:   External Document

## 2010-07-03 NOTE — Progress Notes (Signed)
Summary: cardiac clearence for sugery not yet scheduled  Phone Note Call from Patient Call back at Home Phone [phone]   Caller: Patient [phone] Summary of Call: pt needs surgery for gallstones w/dr. Ezzard Standing needs cardiac clearence does he need appt? Initial call taken by: Glynda Jaeger,  November 20, 2009 4:40 PM  Follow-up for Phone Call        per pt he needs clearance from Dr Daleen Squibb, advised Dr Daleen Squibb on vactation for a few weeks offered PA appt she refused stating he would wait for Dr Daleen Squibb since its a important decision, will send mess to [facility] to get pt appt w/Dr Erlinda Hong, RN  November 20, 2009 5:13 PM   Additional Follow-up for Phone Call Additional follow up Details #1::        ok.  Reviewed Juanito Doom, MD  Additional Follow-up by: Gaylord Shih, MD, [facility],  November 23, 2009 7:19 AM     Appended Document: cardiac clearence for sugery not yet scheduled PT AWARE APPT WITH DR Dasch FOR  12/28/09 9:30 .

## 2010-07-03 NOTE — Cardiovascular Report (Signed)
Summary: Office Visit   Office Visit   Imported By: Roderic Ovens 06/15/2009 12:09:05  _____________________________________________________________________  External Attachment:    Type:   Image     Comment:   External Document

## 2010-07-03 NOTE — Assessment & Plan Note (Signed)
Summary: pre clearance ./cy   Visit Type:  rov Referring Provider:  n/a Primary Provider:  [name], MD  CC:  edema/right ankle...denies any cp or sob..pt may be having cholecystecomy.  History of Present Illness: Mr [place] today for preoperative clearance of an elective cholecystectomy.  He has been advised by Dr. Jarold Motto to consider surgery. Please see his extensive note on June 20. He was evaluated by Dr. Ezzard Standing of surgery who advised against surgery. He is totally asymptomatic without any nausea, vomiting, fever, abdominal pain, or diarrhea.  He is 77 years of age and has a pacer dependency for complete heart block. He has normal left ventricular function her remote infarct determined by nuclear imaging in the mid 90s. He has no other major cardiac issues. His age is a major factor for doing poorly with surgery. He's also had previous abdominal surgery so a laparoscopic procedure may not be an option.  He is currently having no cardiac symptoms including angina, chest pain, orthopnea, or edema. He's had no syncope.  Current Medications (verified): 1)  Alphagan P 0.1 %  Soln (Brimonidine Tartrate) .... One Gtt Ou Three Times A Day 2)  Cosopt 2-0.5 %  Soln (Dorzolamide-Timolol) .... One Gtt Ou Two Times A Day 3)  Xalatan 0.005 % Soln (Latanoprost) .Marland Kitchen.. 1 Drop in Each Eye At Bedtime 4)  Carvedilol 6.25 Mg Tabs (Carvedilol) .... Take One Tablet By Mouth Twice A Day 5)  Quinapril Hcl 20 Mg  Tabs (Quinapril Hcl) .... One By Mouth Two Times A Day 6)  Furosemide 20 Mg  Tabs (Furosemide) .... One By Mouth Once Daily 7)  Allopurinol 300 Mg  Tabs (Allopurinol) .... Take 1 Tablet By Mouth Once A Day 8)  Centrum Silver   Tabs (Multiple Vitamins-Minerals) .... One By Mouth Once Daily 9)  Florastor 250 Mg Caps (Saccharomyces Boulardii) .... Take One By Mouth Two Times A Day As Directed By Drpatterson... 10)  Micro-K 10 Meq Cr-Caps (Potassium Chloride) .... Take 2 By Mouth Once Daily... 11)   Vitamin D 1000 Unit Tabs (Cholecalciferol) .... Take 1 Cap By Mouth Once Daily... 12)  Sanctura Xr 60 Mg Xr24h-Cap (Trospium Chloride) .... Take 1 Tablet By Mouth Once A Day  Allergies (verified): No Known Drug Allergies  Past History:  Past Medical History: Last updated: 10/23/2009 GLAUCOMA (ICD-365.9) Hx of ASTHMATIC BRONCHITIS, ACUTE (ICD-466.0) HYPERTENSION (ICD-401.9) CORONARY ARTERY DISEASE (ICD-414.00) Hx of AV BLOCK, COMPLETE (ICD-426.0) CARDIAC PACEMAKER IN SITU-ST JUDE-VICTORY 5816 (ICD-V45.01) DIVERTICULOSIS OF COLON WITH HEMORRHAGE (ICD-562.12) GASTROINTESTINAL HEMORRHAGE (ICD-578.9) Hx of SMALL BOWEL OBSTRUCTION (ICD-560.9) INGUINAL HERNIA (ICD-550.90) CARCINOMA, PROSTATE, HX OF (ICD-V10.46) DEGENERATIVE JOINT DISEASE (ICD-715.90) Hx of GOUT (ICD-274.9) HEMATOMA, SUBDURAL (ICD-432.1)  Past Surgical History: Last updated: 10/23/2009 Pacemaker Implant S/P bilat cataract surgery S/P tonsillectomy at age 73 S/P subtotal colectomy for diverticular bleed by [facility] 12/03 S/P lipoma removed from left leg 2001 [facility] DrWard [facility] Adm 2/11 by [facility] w/ part SBO- did not require surg.  Family History: Last updated: 11/27/2007 No FH of Colon Cancer:  Social History: Last updated: 09/15/2009 Married, wife= [place], 58yrs 4 children Alcohol Use - yes wine occ Patient has never smoked.  Occupation: Retired Illicit Drug Use - no  Risk Factors: Exercise: yes (11/27/2007)  Risk Factors: Smoking Status: never (09/15/2009)  Clinical Reports Reviewed:  Nuclear Study:  11/05/1993:  PERSANTINE THALLIUM STRESS TEST OVERALL IMPRESSION:  Persantine Thallium myocardial perfusion images revealing evidence for inferior apical and inferior septal myocardial infarction with evidence of ischemia.  Underlying paced rhythm.  Charlton Haws, MD   Review of Systems       negative other than history of present illness  Vital Signs:  Patient profile:   77 year old  male Height:      73 inches Weight:      214 pounds BMI:     28.34 Pulse rate:   62 / minute Pulse rhythm:   regular BP sitting:   126 / 84  (left arm) Cuff size:   large  Vitals Entered By: Danielle Rankin, CMA (December 28, 2009 9:40 AM)  Physical Exam  General:  elderly, hard of hearing, alert oriented x3 Head:  normocephalic and atraumatic Eyes:  PERRLA/EOM intact; conjunctiva and lids normal. Neck:  Neck supple, no JVD. No masses, thyromegaly or abnormal cervical nodes. Lungs:  Clear bilaterally to auscultation and percussion. Heart:  Non-displaced PMI, chest non-tender; regular rate and rhythm, S1, S2 without murmurs, rubs or gallops. Carotid upstroke normal, no bruit. Normal abdominal aortic size, no bruits. Femorals normal pulses, no bruits. Pedals normal pulses. No edema, no varicosities. Msk:  decreased ROM.  decreased ROM.   Pulses:  pulses normal in all 4 extremities Extremities:  No clubbing or cyanosis. Neurologic:  Alert and oriented x 3. Skin:  Intact without lesions or rashes. Psych:  Normal affect.   EKG  Procedure date:  12/28/2009  Findings:      normal sinus rhythm, ventricular pacing at a rate of 60 beats per minute. No change.  PPM Specifications Following MD:  Sherryl Manges, MD     PPM Vendor:  [employer] Jude     PPM Model Number:  [id number]     PPM Serial Number:  [id number] PPM DOI:  11/23/2004     PPM Implanting MD:  Sherryl Manges, MD  Lead 1    Location: RA     DOI: 05/07/1993     Model #: [id number]     Serial #: [id number]     Status: active Lead 2    Location: RV     DOI: 05/07/1993     Model #: [id number]     Serial #: [id number]     Status: active  Magnet Response Rate:  BOL 98.6 ERI  86.3  Indications:  Syncope/CHB   PPM Follow Up Pacer Dependent:  Yes      Episodes Coumadin:  No  Parameters Mode:  DDDR     Lower Rate Limit:  60     Upper Rate Limit:  110 Paced AV Delay:  180     Sensed AV Delay:  160  Impression & Recommendations:  Problem # 1:  CORONARY ARTERY  DISEASE (ICD-414.00) He is stable from his coronary disease. He has remote infarct picked up by nuclear scanning years ago. We have always treated medically. He is at low to moderate risk at most 4 elective cholecystectomy. However, he is having no symptoms and Dr. Reesa Chew does not think he needs an operation. They're obviously good arguments to both sides of this question. However, I've advised the patient to not undergo surgery unless he becomes symptomatic. Her symptoms of gallbladder disease were discussed with him and his son at length. When the time becomes necessary any symptomatic, we will clear him cardiac perspective. His updated medication list for this problem includes:    Carvedilol 6.25 Mg Tabs (Carvedilol) .Marland Kitchen... Take one tablet by mouth twice a day    Quinapril Hcl 20 Mg Tabs (Quinapril hcl) ..... One by mouth two times  a day  Orders: EKG w/ Interpretation (93000)  Problem # 2:  OLD MYOCARDIAL INFARCTION (ICD-412) Assessment: Unchanged  His updated medication list for this problem includes:    Carvedilol 6.25 Mg Tabs (Carvedilol) .Marland Kitchen... Take one tablet by mouth twice a day    Quinapril Hcl 20 Mg Tabs (Quinapril hcl) ..... One by mouth two times a day  Problem # 3:  HYPERTENSION (ICD-401.9) Assessment: Improved  His updated medication list for this problem includes:    Carvedilol 6.25 Mg Tabs (Carvedilol) .Marland Kitchen... Take one tablet by mouth twice a day    Quinapril Hcl 20 Mg Tabs (Quinapril hcl) ..... One by mouth two times a day    Furosemide 20 Mg Tabs (Furosemide) ..... One by mouth once daily  Problem # 4:  Hx of AV BLOCK, COMPLETE (ICD-426.0) Assessment: Unchanged  His updated medication list for this problem includes:    Carvedilol 6.25 Mg Tabs (Carvedilol) .Marland Kitchen... Take one tablet by mouth twice a day    Quinapril Hcl 20 Mg Tabs (Quinapril hcl) ..... One by mouth two times a day  Problem # 5:  CARDIAC PACEMAKER IN SITU-ST JUDE-VICTORY 5816 (ICD-V45.01) Assessment:  Unchanged  Patient Instructions: 1)  Your physician recommends that you schedule a follow-up appointment in: 6 MONTHS WITH DR Citron 2)  Your physician recommends that you continue on your current medications as directed. Please refer to the Current Medication list given to you today.

## 2010-07-03 NOTE — Assessment & Plan Note (Signed)
Summary: 6 week follow up/la   Primary Care Provider:  [name], MD  CC:  6 week ROV & review....  History of Present Illness: 77 y/o BM here for a follow up visit... he has multiple medical problems as noted below...   ~  he sees DrPatterson for GI w/ hx of GI bleed from divertics w/ subtotal colectomy in 2003... he is very sensitive to NSAIDs w/ small bowel ulceration (just above the ileo-colonic anastomosis) & bleeding from Aspirin which was stopped in 2008...    ~  Apr10:  returns w/ wife for f/u eval & review of his mult med issues... he saw DrKlein 1/10 for a pacer check- doing well and no changes made... he saw DrWall in 2/10- doing well, no changes made... he is pleased w/ how he is doing at his age...   ~  September 11, 2009:  he was hosp by [facility], DrTseui, 2/11 for abd pain- part SBO... he was improving on conserv Rx w/ NGtube & had an epis of SOB;  [facility] consulted & ?abn V/Q scan w/ Lovenox Rx;  subseq upper GIBleed (?ngtube trauma) w/ anemia, transfusions, EGD by DrSchooler w/ clots & ulcers in stomach; Rx w/ PPI, Lovenox stopped, etc... eventually tx to SNF for rehab & pt's son took him home for home PT shortly thereafter... he has had some diarrhea & saw DrPatterson 09/05/09 w/ Flagyl & Florastor added- now improved... gradually gaining his strength at home... meds are otherw remarkably similar to when I last saw him in 2010... f/u labs >> incr AlkPhos=229; AbdSonar= GB filled w/ stones; refer to DrPatterson...   ~  Oct 23, 2009:  DrPatterson did CT Abd= thickened GB w/ fluid & stones, mild ductal dil > refered to [facility] for consideration of surg (pending)... otherw improved> diarrhea resolved, breathing stable, BP controlled, no angina/ dizzy/ etc, getting stronger...   Current Problems:   GLAUCOMA (ICD-365.9) - prev followed by Deland Pretty w/ bilat cataract surg & glaucoma on eye 3 diff drops as noted...  Hx of ASTHMATIC BRONCHITIS, ACUTE (ICD-466.0) - he has never smoked... hx  recurrent bronchitic infections over the yrs w/ reactive airway component... no recent prob, and no regular meds required.   HYPERTENSION (ICD-401.9) - on COREG 6.25Bid, QUINAPRIL 20mg Bid, LASIX 20mg /d, & MICROK10- 2/d added... he is fairly regular w/ his meds... BP today = 110/72 & feeling well... he denies HA, visual changes, CP, palipit, dizziness, syncope, change in dyspnea, etc...   CORONARY ARTERY DISEASE (ICD-414.00) - followed by DrWall for Cardiology & rec to take ASA 81mg /d... pt has not had a prev cath...  ~  2DEcho 4/05 showed mild conc LVH and norm LVF- improved from 2002 when EF= 45%...  ~  NuclearStressTest 10/06 showed prior inferoseptal & apical infarct w/ apical AK, w/o ischemia, EF= 48%... no change from prev.  Hx of AV BLOCK, COMPLETE (ICD-426.0), & CARDIAC PACEMAKER IN SITU (ICD-V45.01) - he had a pacemaker placed for complete heart block initially in 1994, & this was exchanged for a new dual chamber pacer: Valene Bors DDDR model [phone] by [facility] 6/06...  ~  seen by DrKlein 1/10 w/ pacer check OK, no further episodes of AFib on his monitor... not a Coumadin candidate due to age, unsteady, hx of GI bleeds, and prev subdural hematoma...  DIVERTICULOSIS OF COLON WITH HEMORRHAGE (ICD-562.12) & GASTROINTESTINAL HEMORRHAGE (ICD-578.9) - he had a subtotal colectomy w/ ileum to distal sigmoid anastomosis in Jul03 by DrNewman...  ~  EGD 8/03 by Dorris Singh  was normal...  ~  colonoscopy 1/08 by DrPatterson showed inflammed mucosa in sm bowel prox to the ileo-colonic anastomosis, few divertics in remaining distal colon, no polyps etc...   ~  f/u colonoscopy 5/09 showed prev colectomy- anastomosis granular & bleeding ?Crohn's... also had divertics & hems... Rx'd w/ Lialda.  Hx of SMALL BOWEL OBSTRUCTION (ICD-560.9)    **see [id number] Hospitalization**  INGUINAL HERNIA (ICD-550.90) - he has bilat fat containing inguinal hernias seen on CT Abd in 2008...   CARCINOMA, PROSTATE, HX OF (ICD-V10.46)  - diagnosed in 1993 & treated w/ XRT... PSA initially  ~29 and dropped to  ~1 after XRT & it has remained there ever since... followed by DrOttelin- also has BPH, min obstructive symptoms, bilat hydroceles & some benign renal cysts...  ~  labs 3/07 by Urology showed PSA= 1.19  ~  labs here 1/10 showed PSA= 1.37  ~  5/11: he reports f/u DrOttelin w/ incontinence symptoms Rx'd SANCTURA XR 60mg /d & improved...  DEGENERATIVE JOINT DISEASE (ICD-715.90) & Hx of GOUT (ICD-274.9) - he has hx of DJD and Gout treated w/ TRAMADOL Prn & ALLOPURINOL 300mg /d... also takes MVI & VIT D 1000 u daily...  HEMATOMA, SUBDURAL (ICD-432.1) - he had a burr hole placed for subdural hematoma in the past...    Allergies (verified): No Known Drug Allergies  Comments:  Nurse/Medical Assistant: The patient's medications and allergies were reviewed with the patient and were updated in the Medication and Allergy Lists.  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) CARDIAC PACEMAKER IN SITU-ST JUDE-VICTORY 5816 (ICD-V45.01) DIVERTICULOSIS OF COLON WITH HEMORRHAGE (ICD-562.12) GASTROINTESTINAL HEMORRHAGE (ICD-578.9) Hx of SMALL BOWEL OBSTRUCTION (ICD-560.9) INGUINAL HERNIA (ICD-550.90) CARCINOMA, PROSTATE, HX OF (ICD-V10.46) DEGENERATIVE JOINT DISEASE (ICD-715.90) Hx of GOUT (ICD-274.9) HEMATOMA, SUBDURAL (ICD-432.1)  Past Surgical History: Pacemaker Implant S/P bilat cataract surgery S/P tonsillectomy at age 56 S/P subtotal colectomy for diverticular bleed by [facility] 12/03 S/P lipoma removed from left leg 2001 [facility] DrWard at [facility] Adm 2/11 by [facility] w/ part SBO- did not require surg.  Family History: Reviewed history from 11/27/2007 and no changes required. No FH of Colon Cancer:  Social History: Reviewed history from 09/15/2009 and no  changes required. Married, wife= [place], 75yrs 4 children Alcohol Use - yes wine occ Patient has never smoked.  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  The patient denies anorexia, fever, weight loss, weight gain, vision loss, hoarseness, chest pain, syncope, peripheral edema, prolonged cough, headaches, hemoptysis, abdominal pain, melena, hematochezia, severe indigestion/heartburn, hematuria, suspicious skin lesions, transient blindness, depression, unusual weight change, abnormal bleeding, enlarged lymph nodes, and angioedema.    Vital Signs:  Patient profile:   77 year old male Height:      73 inches Weight:      215 pounds BMI:     28.47 O2 Sat:      97 % on Room air Temp:     97.8 degrees F oral Pulse rate:   66 / minute BP sitting:   110 / 72  (left arm) Cuff size:   regular  Vitals Entered By: Randell Loop CMA (Oct 23, 2009 2:57 PM)  O2 Sat at Rest %:  97 O2 Flow:  Room  air CC: 6 week ROV & review... Is Patient Diabetic? No Pain Assessment Patient in pain? no      Comments meds updated today--pt brought all meds today   Physical Exam  Additional Exam:  WD, sl overweight, 77 y/o BM in NAD...  GENERAL:  Alert & oriented; pleasant & cooperative... HEENT:  [place]/AT, EOM-full, PERRLA, EACs-clear (sl HOH), TMs-wnl, NOSE-clear discharge , THROAT-clear & wnl. NECK:  Supple w/ decrROM; no JVD; normal carotid impulses w/o bruits; no thyromegaly or nodules palpated; no lymphadenopathy. CHEST:  Clear to P & A; without wheezes/ rales/ or rhonchi heard... HEART:  Regular Rhythm; without murmurs/ rubs/ or gallops detected... ABDOMEN:  Scar of prev surg, soft & nontender; normal bowel sounds; no organomegaly or masses palpated...  EXT:  mod arthritic changes; no varicose veins/ +venous insuffic/ tr edema. NEURO:  CN's  intact; motor testing normal; sensory testing normal; gait is abnormal & balance only fair... DERM:  No lesions noted; no rash etc...    MISC. Report  Procedure date:  10/23/2009  Findings:      I reviewed data w/ pt & his son today:  prev hosp, labs 4/11, AbdSonar & subseq Abd CT scan...  SN   Impression & Recommendations:  Problem # 1:  GALLSTONES (ICD-574.20) He is pending consult w/ DrNewman at [facility]...  Problem # 2:  Hx of ASTHMATIC BRONCHITIS, ACUTE (ICD-466.0) Breathing is stable>  no exac...  Problem # 3:  HYPERTENSION (ICD-401.9) BP controlled>  same meds. His updated medication list for this problem includes:    Carvedilol 6.25 Mg Tabs (Carvedilol) .Marland Kitchen... Take one tablet by mouth twice a day    Quinapril Hcl 20 Mg Tabs (Quinapril hcl) ..... One by mouth two times a day    Furosemide 20 Mg Tabs (Furosemide) ..... One by mouth once daily  Problem # 4:  CORONARY ARTERY DISEASE (ICD-414.00) No angina>  continue current regimen. His updated medication list for this problem includes:    Carvedilol 6.25 Mg Tabs (Carvedilol) .Marland Kitchen... Take one tablet by mouth twice a day    Quinapril Hcl 20 Mg Tabs (Quinapril hcl) ..... One by mouth two times a day    Furosemide 20 Mg Tabs (Furosemide) ..... One by mouth once daily  Problem # 5:  CARDIAC PACEMAKER IN SITU-ST JUDE-VICTORY 5816 (ICD-V45.01) Stable cardiac rhythm...  Problem # 6:  Hx of SMALL BOWEL OBSTRUCTION (ICD-560.9) GI per DrPatterson>  his notes reviewed...  Problem # 7:  CARCINOMA, PROSTATE, HX OF (ICD-V10.46) Followed by drOttelin & incontinence improved w/ Sanctura.  Problem # 8:  DEGENERATIVE JOINT DISEASE (ICD-715.90) Still weak>  ambulating w/ cane & son says improved...  Complete Medication List: 1)  Alphagan P 0.1 % Soln (Brimonidine tartrate) .... One gtt ou three times a day 2)  Cosopt 2-0.5 % Soln (Dorzolamide-timolol) .... One gtt ou two times a day 3)  Xalatan 0.005 % Soln (Latanoprost) .Marland Kitchen.. 1 drop in  each eye at bedtime 4)  Carvedilol 6.25 Mg Tabs (Carvedilol) .... Take one tablet by mouth twice a day 5)  Quinapril Hcl 20 Mg Tabs (Quinapril hcl) .... One by mouth two times a day 6)  Furosemide 20 Mg Tabs (Furosemide) .... One by mouth once daily 7)  Allopurinol 300 Mg Tabs (Allopurinol) .... Take 1 tablet by mouth once a day 8)  Centrum Silver Tabs (Multiple vitamins-minerals) .... One by mouth once daily 9)  Florastor 250 Mg Caps (Saccharomyces boulardii) .... Take one by mouth two times a day as  directed by drpatterson... 10)  Micro-k 10 Meq Cr-caps (Potassium chloride) .... Take 2 by mouth once daily... 11)  Vitamin D 1000 Unit Tabs (Cholecalciferol) .... Take 1 cap by mouth once daily... 12)  Sanctura Xr 60 Mg Xr24h-cap (Trospium chloride) .... Take 1 tablet by mouth once a day  Patient Instructions: 1)  Today we updated your med list- see below.... 2)  Continue your current meds the same... 3)  Let's plan a follow up visit in 3 months... call for any problems.Marland KitchenMarland Kitchen

## 2010-07-03 NOTE — Progress Notes (Signed)
Summary: cx exam  Phone Note Call from Patient Call back at Home Phone [phone]   Caller: Patient Call For: Jarold Motto Reason for Call: Talk to Nurse Summary of Call: Patient was scheduled to have MRI today but they contacted him yesterday and told him that he cannot have this done because he has a pacemaker. What does Dr Jarold Motto want him to do? Initial call taken by: Tawni Levy,  September 19, 2009 8:20 AM    Additional Follow-up for Phone Call Additional follow up Details #2::    CT scan of liver and biliary system... Follow-up by: Mardella Layman MD Clementeen Graham,  September 19, 2009 11:31 AM   Appended Document: cx exam Pt notified.  We will sch CT and let pt know of date and time.  Appended Document: cx exam Pt notified of CT scan appt.  09/25/09 at 10:30.  Pt to stop by for contrast and instructions.   Clinical Lists Changes  Orders: Added new Referral order of CT Abdomen/Pelvis with Contrast (CT Abd/Pelvis w/con) - Signed

## 2010-07-03 NOTE — Assessment & Plan Note (Signed)
Summary: follow up - ok per LA / cj   Primary Care Provider:  Lorin Picket Rosabella Edgin,MD  CC:  1 year ROV & post hosp visit....  History of Present Illness: 77 y/o BM here for a follow up visit... he has multiple medical problems as noted below...   ~  he sees DrPatterson for GI w/ hx of GI bleed from divertics w/ subtotal colectomy in 2003... he is very sensitive to NSAIDs w/ small bowel ulceration (just above the ileo-colonic anastomosis) & bleeding from Aspirin which was stopped in 2008...    ~  September 22, 2008:  returns w/ wife for f/u eval & review of his mult med issues as below... he saw DrKlein 1/10 for a pacer check- doing well and no changes made... he saw DrWall in 2/10- doing well, no changes made... he is pleased w/ how he is doing at his age... he had labs checked 1/10 and reviewed w/ the pt...   ~  September 11, 2009:  he was hosp by [facility], DrTseui, 2/11 for abd pain- part SBO... he was improving on conserv Rx w/ NGtube & had an epis of SOB;  [facility] consulted & ?abn V/Q scan w/ Lovenox Rx;  subseq upper GIBleed (?ngtube trauma) w/ anemia, transfusions, EGD by DrSchooler w/ clots & ulcers in stomach; Rx w/ PPI, Lovenox stopped, etc... eventually tx to SNF for rehab & pt's son took him home for home PT shortly thereafter... he has had some diarrhea & saw DrPatterson 09/05/09 w/ Flagyl & Florastor added- now improved... gradually gaining his strength at home... meds are otherw remarkably similar to when I last saw him in 2010... due for f/u labs today.   Current Problems:   GLAUCOMA (ICD-365.9) - prev followed by Deland Pretty w/ bilat cataract surg & glaucoma on eye drops as noted...  Hx of ASTHMATIC BRONCHITIS, ACUTE (ICD-466.0) - he has never smoked... hx recurrent bronchitic infections over the yrs w/ reactive airway component... no recent prob, and no regular meds required.   HYPERTENSION (ICD-401.9) - on COREG 6.25Bid, QUINAPRIL 20mg Bid, LASIX 20mg /d... he is fairly regular w/ his meds, but not  taking Lasix daily... BP today = 132/76 & feeling well... he denies HA, visual changes, CP, palipit, dizziness, syncope, change in dyspnea, etc...   CORONARY ARTERY DISEASE (ICD-414.00) - followed by DrWall for Cardiology & rec to take ASA 81mg /d... pt has not had a prev cath...  ~  2DEcho 4/05 showed mild conc LVH and norm LVF- improved from 2002 when EF= 45%...  ~  NuclearStressTest 10/06 showed prior inferoseptal & apical infarct w/ apical AK, w/o ischemia, EF= 48%... no change from prev.  Hx of AV BLOCK, COMPLETE (ICD-426.0), & CARDIAC PACEMAKER IN SITU (ICD-V45.01) - he had a pacemaker placed for complete heart block initially in 1994, & this was exchanged for a new dual chamber pacer: Valene Bors DDDR model [id number] by [facility] 6/06...  ~  seen by DrKlein 1/10 w/ pacer check OK, no further episodes of AFib on his monitor... not a Coumadin candidate due to age, unsteady, hx of GI bleeds, and prev subdural hematoma...  DIVERTICULOSIS OF COLON WITH HEMORRHAGE (ICD-562.12) & GASTROINTESTINAL HEMORRHAGE (ICD-578.9) - he had a subtotal colectomy w/ ileum to distal sigmoid anastomosis in Jul03 by DrNewman...  ~  EGD 8/03 by Dorris Singh was normal...  ~  colonoscopy 1/08 by DrPatterson showed inflammed mucosa in sm bowel prox to the ileo-colonic anastomosis, few divertics in remaining distal colon, no polyps etc...   ~  f/u  colonoscopy 5/09 showed prev colectomy- anastomosis granular & bleeding ?Crohn's... also had divertics & hems... Rx'd w/ Lialda.  Hx of SMALL BOWEL OBSTRUCTION (ICD-560.9)    **see [id number] Hospitalization**  INGUINAL HERNIA (ICD-550.90) - he has bilat fat containing inguinal hernias seen on CT Abd in 2008...   CARCINOMA, PROSTATE, HX OF (ICD-V10.46) - diagnosed in 1993 & treated w/ XRT... PSA initially  ~29 and dropped to  ~1 after XRT & it has remained there ever since... followed by DrOttelin- also has BPH, min obstructive symptoms, bilat hydroceles & some benign renal cysts...  ~   labs 3/07 by Urology showed PSA= 1.19  ~  labs here 1/10 showed PSA= 1.37  DEGENERATIVE JOINT DISEASE (ICD-715.90) & Hx of GOUT (ICD-274.9) - he has hx of DJD and Gout treated w/ Tramadol Prn & Allopurinol 300mg /d...  HEMATOMA, SUBDURAL (ICD-432.1) - he had a burr hole placed for subdural hematoma in the past...    Allergies (verified): No Known Drug Allergies  Comments:  Nurse/Medical Assistant: The patient's medications and allergies were reviewed with the patient and were updated in the Medication and Allergy Lists.  Past History:  Past Medical History:  GLAUCOMA (ICD-365.9) Hx of ASTHMATIC BRONCHITIS, ACUTE (ICD-466.0) HYPERTENSION (ICD-401.9) CORONARY ARTERY DISEASE (ICD-414.00) Hx of AV BLOCK, COMPLETE (ICD-426.0) CARDIAC PACEMAKER IN SITU-ST JUDE-VICTORY 5816 (ICD-V45.01) DIVERTICULOSIS OF COLON WITH HEMORRHAGE (ICD-562.12) GASTROINTESTINAL HEMORRHAGE (ICD-578.9) Hx of SMALL BOWEL OBSTRUCTION (ICD-560.9) INGUINAL HERNIA (ICD-550.90) CARCINOMA, PROSTATE, HX OF (ICD-V10.46) DEGENERATIVE JOINT DISEASE (ICD-715.90) Hx of GOUT (ICD-274.9) HEMATOMA, SUBDURAL (ICD-432.1)  Past Surgical History: S/P bilat cataract surgery S/P tonsillectomy at age 40 S/P subtotal colectomy for diverticular bleed by [facility] 12/03 S/P lipoma removed from left leg 2001 [facility]Ward [facility] Adm 2/11 by [facility] w/ part SBO- did not require surg...  Family History: Reviewed history from 11/27/2007 and no changes required. No FH of Colon Cancer:  Social History: Reviewed history from 09/22/2008 and no changes required. Married, wife= [place], 54yrs 4 children Patient is a former smoker.  Alcohol Use - yes wine occ  Review of Systems       The patient complains of fatigue, weakness, malaise, decreased hearing, nasal congestion, dyspnea on exertion, diarrhea, change in bowel habits, gas/bloating, joint pain, stiffness, arthritis, and difficulty walking.  The patient denies fever, chills,  sweats, anorexia, weight loss, sleep disorder, blurring, diplopia, eye irritation, eye discharge, vision loss, eye pain, photophobia, earache, ear discharge, tinnitus, nosebleeds, sore throat, hoarseness, chest pain, palpitations, syncope, orthopnea, PND, peripheral edema, cough, dyspnea at rest, excessive sputum, hemoptysis, wheezing, pleurisy, nausea, vomiting, constipation, abdominal pain, melena, hematochezia, jaundice, indigestion/heartburn, dysphagia, odynophagia, dysuria, hematuria, urinary frequency, urinary hesitancy, nocturia, incontinence, back pain, joint swelling, muscle cramps, muscle weakness, sciatica, restless legs, leg pain at night, leg pain with exertion, rash, itching, dryness, suspicious lesions, paralysis, paresthesias, seizures, tremors, vertigo, transient blindness, frequent falls, frequent headaches, depression, anxiety, memory loss, confusion, cold intolerance, heat intolerance, polydipsia, polyphagia, polyuria, unusual weight change, abnormal bruising, bleeding, enlarged lymph nodes, urticaria, allergic rash, hay fever, and recurrent infections.    Vital Signs:  Patient profile:   78 year old male Height:      73 inches Weight:      211 pounds O2 Sat:      98 % on Room air Temp:     97.6 degrees F oral Pulse rate:   67 / minute BP sitting:   132 / 76  (left arm) Cuff size:   large  Vitals Entered By: Randell Loop  CMA (September 11, 2009 2:56 PM)  O2 Sat at Rest %:  98 O2 Flow:  Room air CC: 1 year ROV & post hosp visit... Is Patient Diabetic? No Pain Assessment Patient in pain? no      Comments meds updated today   Physical Exam  Additional Exam:  WD, sl overweight, 77 y/o BM in NAD...  GENERAL:  Alert & oriented; pleasant & cooperative... HEENT:  [place]/AT, EOM-full, PERRLA, EACs-clear (sl HOH), TMs-wnl, NOSE-clear discharge , THROAT-clear & wnl. NECK:  Supple w/ decrROM; no JVD; normal carotid impulses w/o bruits; no thyromegaly or nodules palpated; no  lymphadenopathy. CHEST:  Clear to P & A; without wheezes/ rales/ or rhonchi heard... HEART:  Regular Rhythm; without murmurs/ rubs/ or gallops detected... ABDOMEN:  Scar of prev surg, soft & nontender; normal bowel sounds; no organomegaly or masses palpated...  EXT:  mod arthritic changes; no varicose veins/ +venous insuffic/ tr edema. NEURO:  CN's intact; motor testing normal; sensory testing normal; gait is abnormal & balance only fair... DERM:  No lesions noted; no rash etc...    MISC. Report  Procedure date:  09/11/2009  Findings:      BMP (METABOL)   Sodium                    145 mEq/L                   135-145   Potassium            [L]  3.4 mEq/L                   3.5-5.1   Chloride                  107 mEq/L                   96-112   Carbon Dioxide            30 mEq/L                    19-32   Glucose                   98 mg/dL                    11-91   BUN                       7 mg/dL                     4-78   Creatinine                1.1 mg/dL                   2.9-5.6   Calcium                   8.6 mg/dL                   2.1-30.8   GFR                       80.47 mL/min                >60   Hepatic/Liver Function Panel (HEPATIC)   Total Bilirubin      [H]  1.3 mg/dL  0.3-1.2   Direct Bilirubin     [H]  0.6 mg/dL                   1.1-9.1   Alkaline Phosphatase [H]  229 U/L                     39-117   AST                       29 U/L                      0-37   ALT                       26 U/L                      0-53   Total Protein             7.4 g/dL                    4.7-8.2   Albumin              [L]  3.0 g/dL                    9.5-6.2   CBC Platelet w/Diff (CBCD)   White Cell Count          9.2 K/uL                    4.5-10.5   Red Cell Count       [L]  3.76 Mil/uL                 4.22-5.81   Hemoglobin           [L]  12.4 g/dL                   13.0-86.5   Hematocrit           [L]  36.1 %                      39.0-52.0   MCV                        95.9 fl                     78.0-100.0   Platelet Count            172.0 K/uL                  150.0-400.0   Neutrophil %              67.0 %                      43.0-77.0   Lymphocyte %              20.3 %                      12.0-46.0   Monocyte %                10.0 %                      3.0-12.0   Eosinophils%  2.5 %                       0.0-5.0   Basophils %               0.2 %                       0.0-3.0  Comments:      TSH (TSH)   FastTSH                   2.20 uIU/mL                 0.35-5.50   IBC Panel (IBC)   Iron                      68 ug/dL                    10-175   Transferrin          [L]  126.5 mg/dL                 102.5-852.7   Iron Saturation           38.4 %                      20.0-50.0  B12 + Folate Panel (B12/FOL)   Vitamin B12               778 pg/mL                   211-911   Folate                    12.9 ng/mL  Prostate Specific Antigen (PSA)   PSA-Hyb                   1.41 ng/mL                  0.10-4.00  Vitamin D (25-Hydroxy) (78242)  Vitamin D (25-Hydroxy)                        [L]  26 ng/mL                    30-89   Impression & Recommendations:  Problem # 1:  DIARRHEA (ICD-787.91) On Rx from DrPatterson w/ Flagyl Tid & Florstar Bid... His updated medication list for this problem includes:    Florastor 250 Mg Caps (Saccharomyces boulardii) .Marland Kitchen... Take one by mouth two times a day as directed by drpatterson...  Orders: TLB-BMP (Basic Metabolic Panel-BMET) (80048-METABOL) TLB-Hepatic/Liver Function Pnl (80076-HEPATIC) TLB-CBC Platelet - w/Differential (85025-CBCD) TLB-TSH (Thyroid Stimulating Hormone) (84443-TSH) TLB-IBC Pnl (Iron/FE;Transferrin) (83550-IBC) TLB-B12 + Folate Pnl (82746_82607-B12/FOL) TLB-PSA (Prostate Specific Antigen) (84153-PSA) T-Vitamin D (25-Hydroxy) (35361-44315)  Problem # 2:  Hx of ASTHMATIC BRONCHITIS, ACUTE (ICD-466.0) Breathing is stable- ?what episode was in the hosp,  but transient & resolved... Abn V/Q but would require CTA for more definitive dx...  His updated medication list for this problem includes:    Flagyl 250 Mg Tabs (Metronidazole) ..... One tablet by mouth three times a day  Problem # 3:  HYPERTENSION (ICD-401.9) Controlled on meds-  continue same. His updated medication list for this problem includes:    Carvedilol 6.25 Mg Tabs (Carvedilol) .Marland Kitchen... Take one tablet by mouth twice a day  Quinapril Hcl 20 Mg Tabs (Quinapril hcl) ..... One by mouth two times a day    Furosemide 20 Mg Tabs (Furosemide) ..... One by mouth once daily  Problem # 4:  CORONARY ARTERY DISEASE (ICD-414.00) He denies CP/ angina/ etc... continue same meds. His updated medication list for this problem includes:    Carvedilol 6.25 Mg Tabs (Carvedilol) .Marland Kitchen... Take one tablet by mouth twice a day    Quinapril Hcl 20 Mg Tabs (Quinapril hcl) ..... One by mouth two times a day    Furosemide 20 Mg Tabs (Furosemide) ..... One by mouth once daily  Problem # 5:  CARDIAC PACEMAKER IN SITU-ST JUDE-VICTORY 5816 (ICD-V45.01) Stable w/ pacer Rx.  Problem # 6:  GI>>> Hx divertics, lower GI bleed w/ surg... this adm w/ SBO- NG suction, difficult intubation, subseq UGI bleed from Lovenox, & EGD w/ clots and ulcer in stomach... Rx'd w/ PPI but not included on his disch meds and DrPatterson didn't feel strongly about starting Rx when seen 4/5...  currently abd soft, nontender, norm BS, etc...  ** SONAR per DrPatterson w/ gallstones and incr Alk Phos>> ?ERCP ?cholecystectomy>> plans per DrPatterson.  Problem # 7:  CARCINOMA, PROSTATE, HX OF (ICD-V10.46) GU followed by drOttelin Qyr by pt's hx...   Problem # 8:  DEGENERATIVE JOINT DISEASE (ICD-715.90) He has mod DJD, hx gout on Allopurinol... he knows to avoid all NSAIDs...  Problem # 9:  OTHER MEDICAL PROBLEMS AS NOTED>>> Vit D is sl low at 26... rec> Vit D 1000 u OTC daily. K+ is sl low at 3.4 on Lasix 20... rec> K10 caps 2  daily...  Complete Medication List: 1)  Alphagan P 0.1 % Soln (Brimonidine tartrate) .... One gtt ou three times a day 2)  Cosopt 2-0.5 % Soln (Dorzolamide-timolol) .... One gtt ou two times a day 3)  Xalatan 0.005 % Soln (Latanoprost) .Marland Kitchen.. 1 drop in each eye at bedtime 4)  Carvedilol 6.25 Mg Tabs (Carvedilol) .... Take one tablet by mouth twice a day 5)  Quinapril Hcl 20 Mg Tabs (Quinapril hcl) .... One by mouth two times a day 6)  Furosemide 20 Mg Tabs (Furosemide) .... One by mouth once daily 7)  Allopurinol 300 Mg Tabs (Allopurinol) .... Take 1 tablet by mouth once a day 8)  Centrum Silver Tabs (Multiple vitamins-minerals) .... One by mouth once daily 9)  Flagyl 250 Mg Tabs (Metronidazole) .... One tablet by mouth three times a day 10)  Florastor 250 Mg Caps (Saccharomyces boulardii) .... Take one by mouth two times a day as directed by drpatterson... 11)  Micro-k 10 Meq Cr-caps (Potassium chloride) .... Take 2 by mouth once daily... 12)  Vitamin D 1000 Unit Tabs (Cholecalciferol) .... Take 1 cap by mouth once daily...  Patient Instructions: 1)  Today we updated your med list- see below.... 2)  Continue the FLAGYL & FLORASTOR perscribed by [employer]... 3)  Continue your Physical Therapy at home... you will get stronger the harder you work at it... 4)  Todfay we did your follow up blood work... please call the "phone tree" in a few days for your lab results.Marland KitchenMarland Kitchen 5)  Call for any problems.Marland KitchenMarland Kitchen 6)  Please schedule a follow-up appointment in 6 weeks... Prescriptions: MICRO-K 10 MEQ CR-CAPS (POTASSIUM CHLORIDE) take 2 by mouth once daily...  #60 x prn   Entered and Authorized by:   Michele Mcalpine MD   Signed by:   Michele Mcalpine MD on 09/13/2009   Method used:  Electronically to        [facility]  [phone]* (retail)       [address]       [place], [place]  [address]       Ph: [phone] or [phone]       Fax: [phone]   RxID:   [id number]

## 2010-07-03 NOTE — Progress Notes (Signed)
Summary: surgery  Phone Note Call from Patient Call back at Home Phone [phone]   Caller: Patient Call For: Dr. Jarold Motto Reason for Call: Talk to Nurse Summary of Call: would like to discuss surgery for gallstones Initial call taken by: Vallarie Mare,  November 07, 2009 10:23 AM  Follow-up for Phone Call        No answer.   Lupita Leash Surface RN  November 07, 2009 3:54 PM  LM with pt's wife for pt to call back.   Lupita Leash Surface RN  November 08, 2009 8:46 AM  Lm for pt to call.   Lupita Leash Surface RN  November 08, 2009 2:57 PM  Pt would like to make appt to see Dr. Jarold Motto and siscuss a few things.  Appt sch. Follow-up by: Ashok Cordia RN,  November 09, 2009 9:14 AM

## 2010-07-03 NOTE — Assessment & Plan Note (Signed)
Summary: pacer check/[employer]   Referring Provider:  n/a Primary Provider:  [name], MD   History of Present Illness: [name] is seen in followup for syncope with complete heart block.  He is status post pacemaker implantation.  he has no symptoms of chest pain. No shortness of breath.  Current Medications (verified): 1)  Alphagan P 0.1 %  Soln (Brimonidine Tartrate) .... One Gtt Ou Three Times A Day 2)  Cosopt 2-0.5 %  Soln (Dorzolamide-Timolol) .... One Gtt Ou Two Times A Day 3)  Xalatan 0.005 % Soln (Latanoprost) .Marland Kitchen.. 1 Drop in Each Eye At Bedtime 4)  Carvedilol 6.25 Mg Tabs (Carvedilol) .... Take One Tablet By Mouth Twice A Day 5)  Quinapril Hcl 20 Mg  Tabs (Quinapril Hcl) .... One By Mouth Two Times A Day 6)  Furosemide 20 Mg  Tabs (Furosemide) .... One By Mouth Once Daily 7)  Allopurinol 300 Mg  Tabs (Allopurinol) .... Take 1 Tablet By Mouth Once A Day 8)  Centrum Silver   Tabs (Multiple Vitamins-Minerals) .... One By Mouth Once Daily 9)  Florastor 250 Mg Caps (Saccharomyces Boulardii) .... Take One By Mouth Two Times A Day As Directed By Drpatterson... 10)  Vitamin D 1000 Unit Tabs (Cholecalciferol) .... Take 1 Cap By Mouth Once Daily...  Allergies (verified): No Known Drug Allergies  Past History:  Past Medical History: Last updated: 10/23/2009 GLAUCOMA (ICD-365.9) Hx of ASTHMATIC BRONCHITIS, ACUTE (ICD-466.0) HYPERTENSION (ICD-401.9) CORONARY ARTERY DISEASE (ICD-414.00) Hx of AV BLOCK, COMPLETE (ICD-426.0) CARDIAC PACEMAKER IN SITU-ST JUDE-VICTORY 5816 (ICD-V45.01) DIVERTICULOSIS OF COLON WITH HEMORRHAGE (ICD-562.12) GASTROINTESTINAL HEMORRHAGE (ICD-578.9) Hx of SMALL BOWEL OBSTRUCTION (ICD-560.9) INGUINAL HERNIA (ICD-550.90) CARCINOMA, PROSTATE, HX OF (ICD-V10.46) DEGENERATIVE JOINT DISEASE (ICD-715.90) Hx of GOUT (ICD-274.9) HEMATOMA, SUBDURAL (ICD-432.1)  Past Surgical History: Last updated: 10/23/2009 Pacemaker Implant S/P bilat cataract surgery S/P  tonsillectomy at age 77 S/P subtotal colectomy for diverticular bleed by [facility] 12/03 S/P lipoma removed from left leg 2001 by DrWard at [facility] Adm 2/11 by [facility] w/ part SBO- did not require surg.  Family History: Last updated: 11/27/2007 No FH of Colon Cancer:  Social History: Last updated: 09/15/2009 Married, wife= Edward Harvey, 54yrs 4 children Alcohol Use - yes wine occ Patient has never smoked.  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 HEENT Normal.  Neck veins were flat, carotids were brisk.  Lungs were clear.  Heart sounds were regular without murmurs or gallops.  Abdomen was soft with active bowel sounds. There is no clubbing cyanosis or edema. Skin Warm and dry    PPM Specifications Following MD:  Sherryl Manges, MD     PPM Vendor:  [employer]     PPM Model Number:  [id number]     PPM Serial Number:  [id number] PPM DOI:  11/23/2004     PPM Implanting MD:  Sherryl Manges, MD  Lead 1    Location: RA     DOI: 05/07/1993     Model #: Camille Bal  Serial #: [id number]     Status: active Lead 2    Location: RV     DOI: 05/07/1993     Model #: [id number]     Serial #: [id number]     Status: active  Magnet Response Rate:  BOL 98.6 ERI  86.3  Indications:  Syncope/CHB   PPM Follow Up Remote Check?  No Battery Voltage:  2.80 V     Battery Est. Longevity:  4.25 years     Pacer Dependent:  Yes       PPM Device Measurements Atrium  Amplitude: 2.6 mV, Impedance: 308 ohms, Threshold: 1.0 V at 0.8 msec Right Ventricle  Impedance: 352 ohms, Threshold: 0.625 V at 0.5 msec  Episodes MS Episodes:  96     Percent Mode Switch:   1.1%     Coumadin:  No Atrial Pacing:  29%     Ventricular Pacing:  99%  Parameters Mode:  DDDR     Lower Rate Limit:  60     Upper Rate Limit:  110 Paced AV Delay:  180     Sensed AV Delay:  160 Next Cardiology Appt Due:  08/02/2010 Tech Comments:  No parameter changes.  Device function normal.  Longest mode switch >16 hours, -coumadin.  Ventricular rates controlled during A-fib. Rate response adequate for the patient's level of activity.   TTM's with [facility].  ROV 6 months clinic. Altha Harm, LPN  January 23, 2010 11:21 AM   Impression & Recommendations:  Problem # 1:  HYPERTENSION (ICD-401.9)  blood pressure is adequately controlled. We'll check a metabolic profile today. His updated medication list for this problem includes:    Carvedilol 6.25 Mg Tabs (Carvedilol) .Marland Kitchen... Take one tablet by mouth twice a day    Quinapril Hcl 20 Mg Tabs (Quinapril hcl) ..... One by mouth two times a day    Furosemide 20 Mg Tabs (Furosemide) ..... One by mouth once daily  Orders: TLB-BMP (Basic Metabolic Panel-BMET) (80048-METABOL)  Problem # 2:  CORONARY ARTERY DISEASE (ICD-414.00) no recurrent chest pains;  He does not take aspirin because of prior issues with bleeding; this occurred in combination with Celebrex. Will resume low-dose aspirin today His updated medication list for this problem includes:    Carvedilol 6.25 Mg Tabs (Carvedilol) .Marland Kitchen... Take one tablet by mouth twice a day    Quinapril Hcl 20 Mg Tabs (Quinapril hcl) ..... One by mouth two times a day  Problem # 3:  Hx of AV BLOCK, COMPLETE (ICD-426.0) stable post pacemaker implant His updated medication list for this problem includes:    Carvedilol 6.25 Mg Tabs (Carvedilol) .Marland Kitchen... Take one tablet by mouth twice a day    Quinapril Hcl 20 Mg Tabs (Quinapril hcl) ..... One by mouth two times a day  Problem # 4:  CARDIAC PACEMAKER IN SITU-ST JUDE-VICTORY 5816 (ICD-V45.01) Device parameters and data were reviewed and no changes were  made  Patient Instructions: 1)  Your physician recommends that you schedule a follow-up appointment in: 6 MONTHS WITH KRISTIN AND PAULA 2)  Your physician recommends that you return for lab work in: TODAY BMET 401.1 3)  Your physician recommends that you continue on your current medications as directed. Please refer to the Current Medication list given to you today. 4)  Your physician discussed the risks, benefits and indications for preventive aspirin therapy. It is recommended that you start (or continue) taking 81 mg of aspirin a day.

## 2010-07-03 NOTE — Miscellaneous (Signed)
Summary: Order/[facility]  Order/[facility]   Imported By: Lester [place] 09/21/2009 09:18:25  _____________________________________________________________________  External Attachment:    Type:   Image     Comment:   External Document

## 2010-07-03 NOTE — Assessment & Plan Note (Signed)
Summary: 3 month return/mhh   Primary Care Provider:  [name], MD  CC:  3 month ROV & review....  History of Present Illness: 77 y/o BM here for a follow up visit... he has multiple medical problems as noted below...   ~  he sees DrPatterson for GI w/ hx of GI bleed from divertics w/ subtotal colectomy in 2003... he is very sensitive to NSAIDs w/ small bowel ulceration (just above the ileo-colonic anastomosis) & bleeding from Aspirin which was stopped in 2008...    ~  September 11, 2009:  he was hosp by [facility], DrTseui, 2/11 for abd pain- part SBO... he was improving on conserv Rx w/ NGtube & had an epis of SOB;  [facility] consulted & ?abn V/Q scan w/ Lovenox Rx;  subseq upper GIBleed (?ngtube trauma) w/ anemia, transfusions, EGD by DrSchooler w/ clots & ulcers in stomach; Rx w/ PPI, Lovenox stopped, etc... eventually tx to SNF for rehab & pt's son took him home for home PT shortly thereafter... he has had some diarrhea & saw DrPatterson 09/05/09 w/ Flagyl & Florastor added- now improved... gradually gaining his strength at home... meds are otherw remarkably similar to when I last saw him in 2010... f/u labs >> incr AlkPhos=[id number]; AbdSonar= GB filled w/ stones; refer to DrPatterson...   ~  Oct 23, 2009:  DrPatterson did CT Abd= thickened GB w/ fluid & stones, mild ductal dil > refered to [facility] for consideration of surg (pending)... otherw improved> diarrhea resolved, breathing stable, BP controlled, no angina/ dizzy/ etc, getting stronger...   ~  January 26, 2010:  he has seen all his specialists & their opinions regarding his GB dis & poss surg>  DrNewman, [facility]- hi risk surg & may need open procedure,  asymptomatic now, continue observation;  DrPatterson, GI- hi risk surg, but hi risk for GB complic, continue obs for now;  DrWall, Cards- hi risk for surg, continue obs for now;  DrKlein- pt stable, pacer OK, continue same meds...  he understands the risks and agrees w/ watchful waiting... currently  asymptomatic, feeling well, etc...   Current Problems:   GLAUCOMA (ICD-365.9) - prev followed by Deland Pretty w/ bilat cataract surg & glaucoma on eye 3 diff drops as noted...  HEARING LOSS - son says he has 3 sets of hearing aides but won't wear any of them...  Hx of ASTHMATIC BRONCHITIS, ACUTE (ICD-466.0) - he has never smoked... hx recurrent bronchitic infections over the yrs w/ reactive airway component... no recent prob, and no regular meds required.   HYPERTENSION (ICD-401.9) - on ASA 81mg /d, COREG 6.25Bid, QUINAPRIL 20mg Bid, LASIX 20mg /d... he is fairly regular w/ his meds... BP today = 122/74 & feeling well... he denies HA, visual changes, CP, palipit, dizziness, syncope, change in dyspnea, etc...   CORONARY ARTERY DISEASE (ICD-414.00) - followed by DrWall for Cardiology on above meds... pt has not had a prev cath...  ~  2DEcho 4/05 showed mild conc LVH and norm LVF- improved from 2002 when EF= 45%...  ~  NuclearStressTest 10/06 showed prior inferoseptal & apical infarct w/ apical AK, w/o ischemia, EF= 48%... no change from prev.  Hx of AV BLOCK, COMPLETE (ICD-426.0), & CARDIAC PACEMAKER IN SITU (ICD-V45.01) - he had a pacemaker placed for complete heart block initially in 1994, & this was exchanged for a new dual chamber pacer: Valene Bors DDDR model [phone] by [facility] 6/06...  ~  seen by DrKlein 1/10 w/ pacer check OK, no further episodes of AFib on his monitor.Marland KitchenMarland Kitchen  not a Coumadin candidate due to age, unsteady, hx of GI bleeds, and prev subdural hematoma.  ~  followed regularly by DrKlein's pacer clinic> stable, doing well, no chnges made.  DIVERTICULOSIS OF COLON WITH HEMORRHAGE (ICD-562.12) & GASTROINTESTINAL HEMORRHAGE (ICD-578.9) - he had a subtotal colectomy w/ ileum to distal sigmoid anastomosis in Jul03 by DrNewman...  ~  EGD 8/03 by Dorris Singh was normal...  ~  colonoscopy 1/08 by DrPatterson showed inflammed mucosa in sm bowel prox to the ileo-colonic anastomosis, few divertics in  remaining distal colon, no polyps etc...   ~  f/u colonoscopy 5/09 showed prev colectomy- anastomosis granular & bleeding ?Crohn's... also had divertics & hems... Rx'd w/ Lialda.  Hx of SMALL BOWEL OBSTRUCTION (ICD-560.9)    **see [id number] Hospitalization by [facility]**  INGUINAL HERNIA (ICD-550.90) - he has bilat fat containing inguinal hernias seen on CT Abd in 2008...   CARCINOMA, PROSTATE, HX OF (ICD-V10.46) - diagnosed in 1993 & treated w/ XRT... PSA initially  ~29 and dropped to  ~1 after XRT & it has remained there ever since... followed by DrOttelin- also has BPH, min obstructive symptoms, bilat hydroceles & some benign renal cysts...  ~  labs 3/07 by Urology showed PSA= 1.19  ~  labs here 1/10 showed PSA= 1.37  ~  5/11: he reports f/u DrOttelin w/ incontinence symptoms Rx'd SANCTURA XR 60mg /d & improved...  DEGENERATIVE JOINT DISEASE (ICD-715.90) & Hx of GOUT (ICD-274.9) - he has hx of DJD and Gout treated w/ TRAMADOL Prn & ALLOPURINOL 300mg /d... also takes MVI & VIT D 1000 u daily...  HEMATOMA, SUBDURAL (ICD-432.1) - he had a burr hole placed for subdural hematoma in the past...    Preventive Screening-Counseling & Management  Alcohol-Tobacco     Smoking Status: never  Caffeine-Diet-Exercise     Does Patient Exercise: yes  Comments: pt did smoke some as a teenager but not regularly  Allergies (verified): No Known Drug Allergies  Comments:  Nurse/Medical Assistant: The patient's medications and allergies were reviewed with the patient and were updated in the Medication and Allergy Lists.  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) CARDIAC PACEMAKER IN SITU-ST JUDE-VICTORY 5816 (ICD-V45.01) DIVERTICULOSIS OF COLON WITH HEMORRHAGE (ICD-562.12) GASTROINTESTINAL HEMORRHAGE (ICD-578.9) Hx of SMALL BOWEL OBSTRUCTION  (ICD-560.9) INGUINAL HERNIA (ICD-550.90) CARCINOMA, PROSTATE, HX OF (ICD-V10.46) DEGENERATIVE JOINT DISEASE (ICD-715.90) Hx of GOUT (ICD-274.9) HEMATOMA, SUBDURAL (ICD-432.1)  Past Surgical History: Pacemaker Implant S/P bilat cataract surgery S/P tonsillectomy at age 89 S/P subtotal colectomy for diverticular bleed by [facility] 12/03 S/P lipoma removed from left leg 2001 by DrWard at [facility] Adm 2/11 by [facility] w/ part SBO- did not require surg  Family History: Reviewed history from 11/27/2007 and no changes required. No FH of Colon Cancer:  Social History: Reviewed history from 09/15/2009 and no changes required. Married, wife= [place], 1yrs 4 children Alcohol Use - yes wine occ Patient has never smoked.  Occupation: Retired  Review of Systems      See HPI       The patient complains of decreased hearing, dyspnea on exertion, muscle weakness, and difficulty walking.  The patient denies anorexia, fever, weight loss, weight gain, vision loss, hoarseness, chest pain, syncope, peripheral edema, prolonged cough, headaches, hemoptysis, abdominal pain, melena, hematochezia, severe indigestion/heartburn, hematuria, incontinence, suspicious skin lesions, transient blindness, depression, unusual weight change, abnormal bleeding, enlarged lymph nodes, and angioedema.    Vital Signs:  Patient profile:   77 year old male Height:      73 inches Weight:      217.38 pounds BMI:     28.78 O2 Sat:      98 % on Room air Temp:     97.6 degrees F oral Pulse rate:   82 / minute BP sitting:   122 / 74  (left arm) Cuff size:   regular  Vitals Entered By: Randell Loop CMA (January 26, 2010 11:03 AM)  O2 Sat at Rest %:  98 O2 Flow:  Room air CC: 3 month ROV & review... Is Patient Diabetic? No Pain Assessment Patient in pain? no      Comments meds updated today with pt   Physical Exam  Additional Exam:  WD, sl overweight, 77 y/o BM in NAD...  GENERAL:  Alert & oriented; pleasant &  cooperative... HEENT:  [place]/AT, EOM-full, PERRLA, EACs-clear (HOH), TMs-wnl, NOSE-clear discharge , THROAT-clear & wnl. NECK:  Supple w/ decrROM; no JVD; normal carotid impulses w/o bruits; no thyromegaly or nodules palpated; no lymphadenopathy. CHEST:  Clear to P & A; without wheezes/ rales/ or rhonchi heard... HEART:  Regular Rhythm; without murmurs/ rubs/ or gallops detected... ABDOMEN:  Scar of prev surg, soft & nontender; normal bowel sounds; no organomegaly or masses palpated...  EXT:  mod arthritic changes; no varicose veins/ +venous insuffic/ tr edema. NEURO:  CN's intact; motor testing normal; sensory testing normal; gait is abnormal & balance only fair... DERM:  No lesions noted; no rash etc...    MISC. Report  Procedure date:  01/26/2010  Findings:      DATA REVIEWED:  ~  DrNewman's surg note 11/03/09...  ~  DrPatterson's GI note 11/20/09..  ~  DrWall's Cards note 12/28/09...  ~  DrKlein's electophysiology note 01/23/10...   Impression & Recommendations:  Problem # 1:  GALLSTONES (ICD-574.20) His specialists are in agreement regarding watching his GB dis and holding off on elective surg...  Problem # 2:  Hx of ASTHMATIC BRONCHITIS, ACUTE (ICD-466.0) Breathing is stable... we discussed the import of exercise program etc...  Problem # 3:  HYPERTENSION (ICD-401.9) BP controlled>  same meds... His updated medication list for this problem includes:    Carvedilol 6.25 Mg Tabs (Carvedilol) .Marland Kitchen... Take one tablet by mouth twice a day    Quinapril Hcl 20 Mg Tabs (Quinapril hcl) ..... One by mouth two times a day    Furosemide 20 Mg Tabs (Furosemide) ..... One by mouth once daily  Problem # 4:  CORONARY ARTERY DISEASE (ICD-414.00) Cardiac per DrWall & KLlein... His updated medication list for this problem includes:    Aspirin 81 Mg Tabs (Aspirin) .Marland Kitchen... Take 1 tablet by mouth once a day    Carvedilol 6.25 Mg Tabs (Carvedilol) .Marland Kitchen... Take one tablet by mouth twice a day    Quinapril  Hcl 20 Mg Tabs (Quinapril hcl) ..... One by mouth two times a day    Furosemide 20 Mg Tabs (Furosemide) ..... One by mouth once daily  Problem # 5:  DIVERTICULOSIS OF COLON WITH HEMORRHAGE (ICD-562.12) GI is stable now>  continue supportive Rx & watchful waiting...  Problem # 6:  CARCINOMA, PROSTATE, HX OF (ICD-V10.46) Followed by DrOttelin...  Problem # 7:  DEGENERATIVE JOINT DISEASE (ICD-715.90) Stable he says... continue supportive Rx & be as active as poss... His updated medication list for this problem includes:    Aspirin 81 Mg Tabs (Aspirin) .Marland Kitchen... Take 1 tablet by mouth once a day  Problem #  8:  OTHER MEDICAL PROBLEMS AS NOTED>>>  Complete Medication List: 1)  Alphagan P 0.1 % Soln (Brimonidine tartrate) .... One gtt ou three times a day 2)  Cosopt 2-0.5 % Soln (Dorzolamide-timolol) .... One gtt ou two times a day 3)  Xalatan 0.005 % Soln (Latanoprost) .Marland Kitchen.. 1 drop in each eye at bedtime 4)  Aspirin 81 Mg Tabs (Aspirin) .... Take 1 tablet by mouth once a day 5)  Carvedilol 6.25 Mg Tabs (Carvedilol) .... Take one tablet by mouth twice a day 6)  Quinapril Hcl 20 Mg Tabs (Quinapril hcl) .... One by mouth two times a day 7)  Furosemide 20 Mg Tabs (Furosemide) .... One by mouth once daily 8)  Allopurinol 300 Mg Tabs (Allopurinol) .... Take 1 tablet by mouth once a day 9)  Centrum Silver Tabs (Multiple vitamins-minerals) .... One by mouth once daily 10)  Vitamin D 1000 Unit Tabs (Cholecalciferol) .... Take 1 cap by mouth once daily...  Patient Instructions: 1)  Today we updated your med list- see below.... 2)  Continue your current meds the same... 3)  Call for any problems.Marland KitchenMarland Kitchen 4)  Please schedule a follow-up appointment in 4-6 months, sooner as needed.

## 2010-07-03 NOTE — Progress Notes (Signed)
Summary: request to change carvedilol to 90 day supply  Phone Note From Pharmacy   [facility] [phone]* Summary of Call: [facility]ilol changed to 90 supply instead of 30 pls call [phone] Initial call taken by: Glynda Jaeger,  December 18, 2009 11:20 AM  Follow-up for Phone Call        Prescription resent for 90 day supply as requested. Follow-up by: Scherrie Bateman, LPN,  December 20, 2009 8:20 AM    Prescriptions: CARVEDILOL 6.25 MG TABS (CARVEDILOL) Take one tablet by mouth twice a day  #180 x 3   Entered by:   Scherrie Bateman, LPN   Authorized by:   Gaylord Shih, MD, [facility]   Signed by:   Scherrie Bateman, LPN on 98/04/9146   Method used:   [facility]ronically to        [facility] [phone]* (retail)       [address]       [place], [place]  [address]       Ph: [phone] or [phone]       Fax: [phone]   RxID:   [id number]

## 2010-07-04 ENCOUNTER — Encounter (INDEPENDENT_AMBULATORY_CARE_PROVIDER_SITE_OTHER): Payer: Medicare Other

## 2010-07-04 ENCOUNTER — Encounter: Payer: Self-pay | Admitting: Internal Medicine

## 2010-07-04 ENCOUNTER — Ambulatory Visit: Admit: 2010-07-04 | Payer: Self-pay | Admitting: Internal Medicine

## 2010-07-04 ENCOUNTER — Telehealth: Payer: Self-pay | Admitting: Pulmonary Disease

## 2010-07-04 DIAGNOSIS — I442 Atrioventricular block, complete: Secondary | ICD-10-CM

## 2010-07-05 NOTE — Assessment & Plan Note (Signed)
Summary: 5 MONTH RETURN/[facility]   Primary Care Provider:  [name], MD  CC:  2-3 month ROV & review....  History of Present Illness: 77 y/o BM here for a follow up visit... he has multiple medical problems including HBP, CAD, & complete heart block w/ pacer followed by DrWall & DrKlein;  hx of GI bleed from divertics w/ subtotal colectomy in 2003 & followed by DrPatterson;  hx SBO, inguinal hernias, gallstones;  hx prostate cancer followed by DrOttelin;  DJD & Gout;  hx subdural hematoma prev requiring burr hole, & mild senile dementia...   ~  Apr11:  he was hosp by [facility], DrTseui, 2/11 for abd pain- part SBO... he was improving on conserv Rx w/ NGtube & had an epis of SOB;  [facility] consulted & ?abn V/Q scan w/ Lovenox Rx;  subseq upper GIBleed (?ngtube trauma) w/ anemia, transfusions, EGD by DrSchooler w/ clots & ulcers in stomach; Rx w/ PPI, Lovenox stopped, etc... eventually tx to SNF for rehab & pt's son took him home for home PT shortly thereafter... he has had some diarrhea & saw DrPatterson 09/05/09 w/ Flagyl & Florastor added- now improved... gradually gaining his strength at home... meds are otherw remarkably similar to when I last saw him in 2010... f/u labs >> incr AlkPhos=229; AbdSonar= GB filled w/ stones; refer to DrPatterson...   ~  May11:  DrPatterson did CT Abd= thickened GB w/ fluid & stones, mild ductal dil > refered to [facility] for consideration of surg (pending)... otherw improved> diarrhea resolved, breathing stable, BP controlled, no angina/ dizzy/ etc, getting stronger...   ~  Aug11:  he has seen all his specialists & their opinions regarding his GB dis & poss surg>  DrNewman, [facility]- hi risk surg & may need open procedure,  asymptomatic now, continue observation;  DrPatterson, GI- hi risk surg, but hi risk for GB complic, continue obs for now;  DrWall, Cards- hi risk for surg, continue obs for now;  DrKlein- pt stable, pacer OK, continue same meds...  he understands the risks and agrees  w/ watchful waiting... currently asymptomatic, feeling well, etc...   ~  Nov11:  he was recently at his podiatrist office having his toenails trimmed & he mentioned that [name] needed to be checked for DM... the pt became concerned & despite the fact that he is not diabetic & all BS checks in the past have been normal, he didn't want to wait until his next f/u visit in Jan12, so he asked for this work-in appt... we discussed DM & agreed to check BMet (norm- BS=79) & an A1c (5.2)> he is reassured.   ~  June 21, 2010:  77 y/o & doing satis w/ multisys dis> he saw DrWall recently for f/u CAD & pacer for CHB- stable, same meds, f/u 39yr... he denies resp symptoms w/o cough, sputum, incr SOB;  denies CP, palpit, ch in edema;  no abd pain, swelling, n/v, or blood seen;  due for f/u lab work> same meds...   Current Problems:   GLAUCOMA (ICD-365.9) - prev followed by Deland Pretty w/ bilat cataract surg & glaucoma on eye 3 diff drops as noted...  HEARING LOSS - son says he has 3 sets of hearing aides but won't wear any of them...  Hx of ASTHMATIC BRONCHITIS, ACUTE (ICD-466.0) - he has never smoked... hx recurrent bronchitic infections over the yrs w/ reactive airway component... no recent prob, and no regular meds required.   HYPERTENSION (ICD-401.9) - on ASA 81mg /d, COREG 6.25Bid,  QUINAPRIL 20mg Bid, LASIX 20mg /d... he is fairly regular w/ his meds... BP today = 130/68 & feeling well... he denies HA, visual changes, CP, palipit, dizziness, syncope, change in dyspnea, etc...   CORONARY ARTERY DISEASE (ICD-414.00) - followed by DrWall for Cardiology on above meds... pt has not had a prev cath...  ~  2DEcho 4/05 showed mild conc LVH and norm LVF- improved from 2002 when EF= 45%...  ~  NuclearStressTest 10/06 showed prior inferoseptal & apical infarct w/ apical AK, w/o ischemia, EF= 48%... no change from prev.  Hx of AV BLOCK, COMPLETE (ICD-426.0), & CARDIAC PACEMAKER IN SITU (ICD-V45.01) - he had a pacemaker  placed for complete heart block initially in 1994, & this was exchanged for a new dual chamber pacer: Valene Bors DDDR model [phone] by [facility] 6/06...  ~  seen by DrKlein 1/10 w/ pacer check OK, no further episodes of AFib on his monitor... not a Coumadin candidate due to age, unsteady, hx of GI bleeds, and prev subdural hematoma.  ~  followed regularly by DrKlein's pacer clinic> stable, doing well, no changes made.  DIVERTICULOSIS OF COLON WITH HEMORRHAGE (ICD-562.12) & GASTROINTESTINAL HEMORRHAGE (ICD-578.9) - he had a subtotal colectomy w/ ileum to distal sigmoid anastomosis in Jul03 by DrNewman...  ~  EGD 8/03 by Dorris Singh was normal...  ~  colonoscopy 1/08 by DrPatterson showed inflammed mucosa in sm bowel prox to the ileo-colonic anastomosis, few divertics in remaining distal colon, no polyps etc...   ~  f/u colonoscopy 5/09 showed prev colectomy- anastomosis granular & bleeding ?Crohn's... also had divertics & hems... Rx'd w/ Lialda.  Hx of SMALL BOWEL OBSTRUCTION (ICD-560.9)    **see [id number] Hospitalization by [facility]**  INGUINAL HERNIA (ICD-550.90) - he has bilat fat containing inguinal hernias seen on CT Abd in 2008...   GALLSTONES (ICD-574.20) - Sonar 4/11 showed mult gallstones filling the gallbladder, and CT Abd 4/11 showed mild extrahep biliary ductal dilatation... he was evaluated by DrPatterson/ GI, DrNewman/ Surg, DrWall/ Cardfs> all agreed that risk was hi & to hold off on surg unless absolutely necessary...  CARCINOMA, PROSTATE, HX OF (ICD-V10.46) - diagnosed in 1993 & treated w/ XRT... PSA initially  ~29 and dropped to  ~1 after XRT & it has remained there ever since... followed by DrOttelin- also has BPH, min obstructive symptoms, bilat hydroceles & some benign renal cysts...  ~  labs 3/07 by Urology showed PSA= 1.19  ~  labs here 1/10 showed PSA= 1.37  ~  5/11: he reports f/u DrOttelin w/ incontinence symptoms Rx'd SANCTURA XR 60mg /d & improved, so he stopped this on his own & states  that he is doingh satis now...  DEGENERATIVE JOINT DISEASE (ICD-715.90) & Hx of GOUT (ICD-274.9) - he has hx of DJD and Gout treated w/ TRAMADOL Prn & ALLOPURINOL 300mg /d... also takes MVI & VIT D 1000 u daily...  HEMATOMA, SUBDURAL (ICD-432.1) - he had a burr hole placed for subdural hematoma in the past...    Preventive Screening-Counseling & Management  Alcohol-Tobacco     Smoking Status: never  Caffeine-Diet-Exercise     Does Patient Exercise: yes  Allergies (verified): No Known Drug Allergies  Comments:  Nurse/Medical Assistant: The patient's medications and allergies were reviewed with the patient and were updated in the Medication and Allergy Lists.  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) CARDIAC PACEMAKER IN SITU-ST JUDE-VICTORY 5816 (ICD-V45.01) DIVERTICULOSIS OF COLON  WITH HEMORRHAGE (ICD-562.12) GASTROINTESTINAL HEMORRHAGE (ICD-578.9) Hx of SMALL BOWEL OBSTRUCTION (ICD-560.9) INGUINAL HERNIA (ICD-550.90) GALLSTONES (ICD-574.20) CARCINOMA, PROSTATE, HX OF (ICD-V10.46) DEGENERATIVE JOINT DISEASE (ICD-715.90) Hx of GOUT (ICD-274.9) HEMATOMA, SUBDURAL (ICD-432.1)  Past Surgical History: Pacemaker Implant S/P bilat cataract surgery S/P tonsillectomy at age 6 S/P subtotal colectomy for diverticular bleed by [facility] 12/03 S/P lipoma removed from left leg 2001 [facility]Ward [facility] 2/11 by [facility] w/ part SBO- did not require surg  Family History: Reviewed history from 11/27/2007 and no changes required. No FH of Colon Cancer:  Social History: Reviewed history from 04/06/2010 and no changes required. Married, wife= [place], 48yrs 4 children Alcohol Use - yes wine occ Patient has never smoked.  Occupation: Retired  Review of Systems      See HPI       The patient complains of dyspnea  on exertion, peripheral edema, muscle weakness, and difficulty walking.  The patient denies anorexia, fever, weight loss, weight gain, vision loss, decreased hearing, hoarseness, chest pain, syncope, prolonged cough, headaches, hemoptysis, abdominal pain, melena, hematochezia, severe indigestion/heartburn, hematuria, incontinence, suspicious skin lesions, transient blindness, depression, unusual weight change, abnormal bleeding, enlarged lymph nodes, and angioedema.    Vital Signs:  Patient profile:   77 year old male Height:      73 inches Weight:      224 pounds BMI:     29.66 O2 Sat:      98 % on Room air Temp:     96.9 degrees F oral Pulse rate:   70 / minute BP sitting:   130 / 68  (left arm) Cuff size:   regular  Vitals Entered By: Randell Loop CMA (June 21, 2010 11:20 AM)  O2 Sat at Rest %:  98 O2 Flow:  Room air CC: 2-3 month ROV & review... Is Patient Diabetic? No Pain Assessment Patient in pain? no      Comments no changes in meds today   Physical Exam  Additional Exam:  WD, overweight, 77 y/o BM in NAD...  GENERAL:  Alert, pleasant & cooperative... HEENT:  [place]/AT, EOM-full, PERRLA, EACs-clear (HOH), TMs-wnl, NOSE-clear discharge , THROAT-clear & wnl. NECK:  Supple w/ decrROM; no JVD; normal carotid impulses w/o bruits; no thyromegaly or nodules palpated; no lymphadenopathy. CHEST:  Clear to P & A; without wheezes/ rales/ or rhonchi heard... HEART:  Regular Rhythm; without murmurs/ rubs/ or gallops detected... ABDOMEN:  Scar of prev surg, soft & nontender; normal bowel sounds; no organomegaly or masses palpated...  EXT:  mod arthritic changes; no varicose veins/ +venous insuffic/ tr edema. NEURO:  CN's intact; motor testing normal; sensory testing normal; gait is abnormal & balance only fair... DERM:  No lesions noted; no rash etc...    MISC. Report  Procedure date:  06/21/2010  Findings:      BMP (METABOL)   Sodium                    137 mEq/L                    135-145   Potassium                 4.1 mEq/L                   3.5-5.1   Chloride                  103 mEq/L  96-112   Carbon Dioxide            27 mEq/L                    19-32   Glucose                   77 mg/dL                    25-95   BUN                  [H]  24 mg/dL                    6-38   Creatinine                1.5 mg/dL                   7.5-6.4   Calcium                   8.9 mg/dL                   3.3-29.5   GFR                  [L]  55.31 mL/min                >60.00  Hepatic/Liver Function Panel (HEPATIC)   Total Bilirubin           1.2 mg/dL                   1.8-8.4   Direct Bilirubin     [H]  0.6 mg/dL                   1.6-6.0   Alkaline Phosphatase [H]  150 U/L                     39-117   AST                       27 U/L                      0-37   ALT                       38 U/L                      0-53   Total Protein             7.0 g/dL                    6.3-0.1   Albumin              [L]  3.2 g/dL                    6.0-1.0  CBC Platelet w/Diff (CBCD)   White Cell Count          8.7 K/uL                    4.5-10.5   Red Cell Count       [L]  3.54 Mil/uL                 4.22-5.81   Hemoglobin           [  L]  12.5 g/dL                   16.1-09.6   Hematocrit           [L]  36.7 %                      39.0-52.0   MCV                  [H]  103.8 fl                    78.0-100.0   Platelet Count            178.0 K/uL                  150.0-400.0   Neutrophil %              73.6 %                      43.0-77.0   Lymphocyte %              16.1 %                      12.0-46.0   Monocyte %                8.5 %                       3.0-12.0   Eosinophils%              1.5 %                       0.0-5.0   Basophils %               0.3 %                       0.0-3.0  Comments:      TSH (TSH)   FastTSH                   1.56 uIU/mL                 0.35-5.50   IBC Panel (IBC)   Iron                      115 ug/dL                    04-540   Transferrin          [L]  207.1 mg/dL                 981.1-914.7   Iron Saturation           39.7 %                      20.0-50.0   Prostate Specific Antigen (PSA)   PSA-Hyb                   1.38 ng/mL                  0.10-4.00   Impression & Recommendations:  Problem # 1:  CARDIAC>>> Followed by Seabron Spates & DrKlein w/ HBP, CAD, Pacer for CHB>  stable on meds, continue same...  Problem #  2:  GI>>> Followed by DrPatterson w/ divertics, GIB, & prev surg, SBO, inguinal hernias, gallstones, etc... stable for now on meds- reviewed, continue same.  Problem # 3:  CARCINOMA, PROSTATE, HX OF (ICD-V10.46) PSA is stable... Orders: TLB-BMP (Basic Metabolic Panel-BMET) (80048-METABOL) TLB-Hepatic/Liver Function Pnl (80076-HEPATIC) TLB-CBC Platelet - w/Differential (85025-CBCD) TLB-TSH (Thyroid Stimulating Hormone) (84443-TSH) TLB-IBC Pnl (Iron/FE;Transferrin) (83550-IBC) TLB-PSA (Prostate Specific Antigen) (84153-PSA)  Problem # 4:  DEGENERATIVE JOINT DISEASE (ICD-715.90) Severe DJD, Gout>  on Allopurinol, Tramadol, etc... His updated medication list for this problem includes:    Aspirin 81 Mg Tabs (Aspirin) .Marland Kitchen... Take 1 tablet by mouth once a day  Problem # 5:  HEMATOMA, SUBDURAL (ICD-432.1) He has a mild senile dementia> cared for at home by his son... stable.  Problem # 6:  OTHER MEDICAL PROBLEMS AS NOTED>>  Complete Medication List: 1)  Alphagan P 0.1 % Soln (Brimonidine tartrate) .... One gtt ou three times a day 2)  Cosopt 2-0.5 % Soln (Dorzolamide-timolol) .... One gtt ou two times a day 3)  Xalatan 0.005 % Soln (Latanoprost) .Marland Kitchen.. 1 drop in each eye at bedtime 4)  Aspirin 81 Mg Tabs (Aspirin) .... Take 1 tablet by mouth once a day 5)  Carvedilol 6.25 Mg Tabs (Carvedilol) .... Take one tablet by mouth twice a day 6)  Quinapril Hcl 20 Mg Tabs (Quinapril hcl) .... One by mouth two times a day 7)  Furosemide 20 Mg Tabs (Furosemide) .... One by mouth once daily 8)   Allopurinol 300 Mg Tabs (Allopurinol) .... Take 1 tablet by mouth once a day 9)  Centrum Silver Tabs (Multiple vitamins-minerals) .... One by mouth once daily 10)  Vitamin D 1000 Unit Tabs (Cholecalciferol) .... Take 1 cap by mouth once daily...  Patient Instructions: 1)  Today we updated your med list- see below.... 2)  Continue your current meds the same... 3)  Today we did your follow up blood work... please call the "phone tree" in a few days for your lab results.Marland KitchenMarland Kitchen 4)  Congrats on your 89rd birthday!!! 5)  Call for any problems.Marland KitchenMarland Kitchen 6)  Let's plan a follow up appt in 6 months, sooner as needed.  Appended Document: 5 MONTH RETURN/[facility] "Phone Tree" message recorded... SN

## 2010-07-05 NOTE — Cardiovascular Report (Signed)
Summary: TTM   TTM   Imported By: Roderic Ovens 06/08/2010 14:29:15  _____________________________________________________________________  External Attachment:    Type:   Image     Comment:   External Document

## 2010-07-05 NOTE — Assessment & Plan Note (Signed)
Summary: per check out/sf   Visit Type:  Follow-up Referring Provider:  n/a Primary Provider:  [name], MD  CC:  no complaints.  History of Present Illness: [name] comes in today for followup of his coronary disease. He is status post pacemaker implantation for complete heart block.  He's having no angina or ischemia. He denies any presyncope, syncope, or palpitations. His lower extremity edema has been stable.  Current Medications (verified): 1)  Alphagan P 0.1 %  Soln (Brimonidine Tartrate) .... One Gtt Ou Three Times A Day 2)  Cosopt 2-0.5 %  Soln (Dorzolamide-Timolol) .... One Gtt Ou Two Times A Day 3)  Xalatan 0.005 % Soln (Latanoprost) .Marland Kitchen.. 1 Drop in Each Eye At Bedtime 4)  Aspirin 81 Mg Tabs (Aspirin) .... Take 1 Tablet By Mouth Once A Day 5)  Carvedilol 6.25 Mg Tabs (Carvedilol) .... Take One Tablet By Mouth Twice A Day 6)  Quinapril Hcl 20 Mg  Tabs (Quinapril Hcl) .... One By Mouth Two Times A Day 7)  Furosemide 20 Mg  Tabs (Furosemide) .... One By Mouth Once Daily 8)  Allopurinol 300 Mg  Tabs (Allopurinol) .... Take 1 Tablet By Mouth Once A Day 9)  Centrum Silver   Tabs (Multiple Vitamins-Minerals) .... One By Mouth Once Daily 10)  Vitamin D 1000 Unit Tabs (Cholecalciferol) .... Take 1 Cap By Mouth Once Daily...  Allergies (verified): No Known Drug Allergies  Past History:  Past Medical History: Last updated: 04/06/2010 GLAUCOMA (ICD-365.9) Hx of ASTHMATIC BRONCHITIS, ACUTE (ICD-466.0) HYPERTENSION (ICD-401.9) CORONARY ARTERY DISEASE (ICD-414.00) Hx of AV BLOCK, COMPLETE (ICD-426.0) CARDIAC PACEMAKER IN SITU-ST JUDE-VICTORY 5816 (ICD-V45.01) DIVERTICULOSIS OF COLON WITH HEMORRHAGE (ICD-562.12) GASTROINTESTINAL HEMORRHAGE (ICD-578.9) Hx of SMALL BOWEL OBSTRUCTION (ICD-560.9) INGUINAL HERNIA (ICD-550.90) GALLSTONES (ICD-574.20) CARCINOMA, PROSTATE, HX OF (ICD-V10.46) DEGENERATIVE JOINT DISEASE (ICD-715.90) Hx of GOUT (ICD-274.9) HEMATOMA, SUBDURAL  (ICD-432.1)  Past Surgical History: Last updated: 04/06/2010 Pacemaker Implant S/P bilat cataract surgery S/P tonsillectomy at age 43 S/P subtotal colectomy for diverticular bleed by [facility] 12/03 S/P lipoma removed from left leg 2001 [facility]Ward at [facility] 2/11 by [facility] w/ part SBO- did not require surg  Family History: Last updated: 11/27/2007 No FH of Colon Cancer:  Social History: Last updated: 04/06/2010 Married, wife= [place], 81yrs 4 children Alcohol Use - yes wine occ Patient has never smoked.  Occupation: Retired  Risk Factors: Exercise: yes (04/06/2010)  Risk Factors: Smoking Status: never (04/06/2010)  Review of Systems       nother than history of present illness  Vital Signs:  Patient profile:   77 year old male Height:      73 inches Weight:      220 pounds BMI:     29.13 Pulse rate:   75 / minute BP sitting:   114 / 68  (left arm) Cuff size:   large  Vitals Entered By: Hardin Negus, RMA (June 06, 2010 10:24 AM)  Physical Exam  General:  elderly, looks his stated age, in no acute distress Head:  normocephalic and atraumatic Neck:  Neck supple, no JVD. No masses, thyromegaly or abnormal cervical nodes. Chest Litt:  pacemaker site right subclavian is stable Lungs:  Clear bilaterally to auscultation and percussion. Heart:  PMI nondisplaced, regular rate and rhythm, soft systolic murmur, no bruits Msk:  decreased ROM.   Pulses:  pulses normal in all 4 extremities Extremities:  No clubbing or cyanosis. Neurologic:  Alert and oriented x 3. Skin:  Intact without lesions or rashes. Psych:  Normal affect.   PPM Specifications Following MD:  Sherryl Manges, MD     PPM Vendor:  [employer]     PPM Model Number:  [id number]     PPM Serial Number:  [id number] PPM DOI:  11/23/2004     PPM Implanting MD:  Sherryl Manges, MD  Lead 1    Location: RA     DOI: 05/07/1993     Model #: [id number]     Serial #: [id number]     Status: active Lead 2    Location: RV      DOI: 05/07/1993     Model #: [id number]     Serial #: [id number]     Status: active  Magnet Response Rate:  BOL 98.6 ERI  86.3  Indications:  Syncope/CHB   PPM Follow Up Pacer Dependent:  Yes      Episodes Coumadin:  No  Parameters Mode:  DDDR     Lower Rate Limit:  60     Upper Rate Limit:  110 Paced AV Delay:  180     Sensed AV Delay:  160  Impression & Recommendations:  Problem # 1:  OLD MYOCARDIAL INFARCTION (ICD-412) Assessment Unchanged  His updated medication list for this problem includes:    Aspirin 81 Mg Tabs (Aspirin) .Marland Kitchen... Take 1 tablet by mouth once a day    Carvedilol 6.25 Mg Tabs (Carvedilol) .Marland Kitchen... Take one tablet by mouth twice a day    Quinapril Hcl 20 Mg Tabs (Quinapril hcl) ..... One by mouth two times a day  Problem # 2:  CORONARY ARTERY DISEASE (ICD-414.00) Assessment: Unchanged  His updated medication list for this problem includes:    Aspirin 81 Mg Tabs (Aspirin) .Marland Kitchen... Take 1 tablet by mouth once a day    Carvedilol 6.25 Mg Tabs (Carvedilol) .Marland Kitchen... Take one tablet by mouth twice a day    Quinapril Hcl 20 Mg Tabs (Quinapril hcl) ..... One by mouth two times a day  Problem # 3:  Hx of AV BLOCK, COMPLETE (ICD-426.0) Assessment: Unchanged  His updated medication list for this problem includes:    Aspirin 81 Mg Tabs (Aspirin) .Marland Kitchen... Take 1 tablet by mouth once a day    Carvedilol 6.25 Mg Tabs (Carvedilol) .Marland Kitchen... Take one tablet by mouth twice a day    Quinapril Hcl 20 Mg Tabs (Quinapril hcl) ..... One by mouth two times a day  Problem # 4:  CARDIAC PACEMAKER IN SITU-ST JUDE-VICTORY 5816 (ICD-V45.01) Assessment: Unchanged  Patient Instructions: 1)  Your physician recommends that you schedule a follow-up appointment in: 1 year with Dr. Daleen Squibb 2)  Device clinic check  February 1,2012 at 3:30pm 3)  Your physician recommends that you continue on your current medications as directed. Please refer to the Current Medication list given to you today.

## 2010-07-06 NOTE — Miscellaneous (Signed)
Summary: [facility]  [facility]   Imported By: Lester Orocovis 09/27/2009 08:30:16  _____________________________________________________________________  External Attachment:    Type:   Image     Comment:   External Document

## 2010-07-06 NOTE — Miscellaneous (Signed)
Summary: Medication order/[facility]  Medication order/[facility]   Imported By: Sherian Rein 10/03/2009 09:39:00  _____________________________________________________________________  External Attachment:    Type:   Image     Comment:   External Document

## 2010-07-11 NOTE — Progress Notes (Signed)
Summary: waiting in lobby to speak top nurse  Phone Note Call from Patient   Caller: Patient Call For: Eily Louvier Summary of Call: pt is in lobby now requesting to speak to nurse re: forms for the [place]. he would not leave this with me but wanted to speak to nurse. he has someone with him and is waiting in lobby Initial call taken by: Tivis Ringer, CNA,  July 04, 2010 4:17 PM  Follow-up for Phone Call        spoke with pt and he has a form that needs to be singed so he can get assistance from the [place] for home health care and other assistance. Form given to [facility] along with pamphlet with info explaining benefits. Carron Curie CMA  July 04, 2010 4:33 PM   Additional Follow-up for Phone Call Additional follow up Details #1::        papers have been signed by SN and i called to let pt know and we will place this in the mail to the pt per his request Randell Loop [facility]  July 05, 2010 9:39 AM

## 2010-07-19 NOTE — Cardiovascular Report (Signed)
Summary: Office Visit   Office Visit   Imported By: Roderic Ovens 07/10/2010 15:58:49  _____________________________________________________________________  External Attachment:    Type:   Image     Comment:   External Document

## 2010-07-19 NOTE — Procedures (Signed)
Summary: [facility]   Current Medications (verified): 1)  Alphagan P 0.1 %  Soln (Brimonidine Tartrate) .... One Gtt Ou Three Times A Day 2)  Cosopt 2-0.5 %  Soln (Dorzolamide-Timolol) .... One Gtt Ou Two Times A Day 3)  Xalatan 0.005 % Soln (Latanoprost) .Marland Kitchen.. 1 Drop in Each Eye At Bedtime 4)  Aspirin 81 Mg Tabs (Aspirin) .... Take 1 Tablet By Mouth Once A Day 5)  Carvedilol 6.25 Mg Tabs (Carvedilol) .... Take One Tablet By Mouth Twice A Day 6)  Quinapril Hcl 20 Mg  Tabs (Quinapril Hcl) .... One By Mouth Two Times A Day 7)  Furosemide 20 Mg  Tabs (Furosemide) .... One By Mouth Once Daily 8)  Allopurinol 300 Mg  Tabs (Allopurinol) .... Take 1 Tablet By Mouth Once A Day 9)  Centrum Silver   Tabs (Multiple Vitamins-Minerals) .... One By Mouth Once Daily 10)  Vitamin D 1000 Unit Tabs (Cholecalciferol) .... Take 1 Cap By Mouth Once Daily...  Allergies (verified): No Known Drug Allergies  PPM Specifications Following MD:  Sherryl Manges, MD     PPM Vendor:  St Jude     PPM Model Number:  [id number]     PPM Serial Number:  [id number] PPM DOI:  11/23/2004     PPM Implanting MD:  Sherryl Manges, MD  Lead 1    Location: RA     DOI: 05/07/1993     Model #: [id number]     Serial #: [id number]     Status: active Lead 2    Location: RV     DOI: 05/07/1993     Model #: [id number]     Serial #: [id number]     Status: active  Magnet Response Rate:  BOL 98.6 ERI  86.3  Indications:  Syncope/CHB   PPM Follow Up Remote Check?  No Battery Voltage:  2.79 V     Battery Est. Longevity:  4 years     Pacer Dependent:  Yes       PPM Device Measurements Atrium  Amplitude: 2.2 mV, Impedance: 297 ohms, Threshold: 1.25 V at 0.8 msec Right Ventricle  Impedance: 337 ohms, Threshold: 0.625 V at 0.5 msec  Episodes MS Episodes:  166     Percent Mode Switch:  2.4%     Coumadin:  No Atrial Pacing:  69%     Ventricular Pacing:  100%  Parameters Mode:  DDDR     Lower Rate Limit:  60     Upper Rate Limit:  110 Paced AV  Delay:  180     Sensed AV Delay:  160 Next Cardiology Appt Due:  01/02/2011 Tech Comments:  No parameter changes.  Device function normal.  Rate response adequate for the patient's level of activity.   TTM's with [employer]. ROV 6 months with Dr. Graciela Husbands. Altha Harm, LPN  July 04, 2010 3:58 PM

## 2010-08-22 LAB — CARDIAC PANEL(CRET KIN+CKTOT+MB+TROPI)
CK, MB: 2.3 ng/mL (ref 0.3–4.0)
CK, MB: 2.9 ng/mL (ref 0.3–4.0)
Relative Index: INVALID (ref 0.0–2.5)
Relative Index: INVALID (ref 0.0–2.5)
Total CK: 76 U/L (ref 7–232)
Troponin I: 0.08 ng/mL — ABNORMAL HIGH (ref 0.00–0.06)

## 2010-08-22 LAB — CBC
HCT: 23.9 % — ABNORMAL LOW (ref 39.0–52.0)
HCT: 24.9 % — ABNORMAL LOW (ref 39.0–52.0)
HCT: 25.4 % — ABNORMAL LOW (ref 39.0–52.0)
HCT: 25.6 % — ABNORMAL LOW (ref 39.0–52.0)
HCT: 26.1 % — ABNORMAL LOW (ref 39.0–52.0)
HCT: 28.6 % — ABNORMAL LOW (ref 39.0–52.0)
HCT: 29.3 % — ABNORMAL LOW (ref 39.0–52.0)
HCT: 29.6 % — ABNORMAL LOW (ref 39.0–52.0)
HCT: 35.8 % — ABNORMAL LOW (ref 39.0–52.0)
HCT: 41.4 % (ref 39.0–52.0)
Hemoglobin: 10 g/dL — ABNORMAL LOW (ref 13.0–17.0)
Hemoglobin: 10.2 g/dL — ABNORMAL LOW (ref 13.0–17.0)
Hemoglobin: 10.3 g/dL — ABNORMAL LOW (ref 13.0–17.0)
Hemoglobin: 10.4 g/dL — ABNORMAL LOW (ref 13.0–17.0)
Hemoglobin: 10.5 g/dL — ABNORMAL LOW (ref 13.0–17.0)
Hemoglobin: 14 g/dL (ref 13.0–17.0)
Hemoglobin: 8.5 g/dL — ABNORMAL LOW (ref 13.0–17.0)
Hemoglobin: 8.9 g/dL — ABNORMAL LOW (ref 13.0–17.0)
Hemoglobin: 8.9 g/dL — ABNORMAL LOW (ref 13.0–17.0)
Hemoglobin: 9.1 g/dL — ABNORMAL LOW (ref 13.0–17.0)
MCHC: 33.3 g/dL (ref 30.0–36.0)
MCHC: 33.7 g/dL (ref 30.0–36.0)
MCHC: 33.8 g/dL (ref 30.0–36.0)
MCHC: 34 g/dL (ref 30.0–36.0)
MCHC: 34.2 g/dL (ref 30.0–36.0)
MCHC: 34.6 g/dL (ref 30.0–36.0)
MCHC: 34.6 g/dL (ref 30.0–36.0)
MCHC: 34.7 g/dL (ref 30.0–36.0)
MCHC: 34.7 g/dL (ref 30.0–36.0)
MCHC: 34.8 g/dL (ref 30.0–36.0)
MCHC: 34.8 g/dL (ref 30.0–36.0)
MCHC: 35 g/dL (ref 30.0–36.0)
MCHC: 35 g/dL (ref 30.0–36.0)
MCHC: 35.6 g/dL (ref 30.0–36.0)
MCV: 101 fL — ABNORMAL HIGH (ref 78.0–100.0)
MCV: 101.1 fL — ABNORMAL HIGH (ref 78.0–100.0)
MCV: 101.4 fL — ABNORMAL HIGH (ref 78.0–100.0)
MCV: 101.5 fL — ABNORMAL HIGH (ref 78.0–100.0)
MCV: 101.7 fL — ABNORMAL HIGH (ref 78.0–100.0)
MCV: 101.8 fL — ABNORMAL HIGH (ref 78.0–100.0)
MCV: 101.8 fL — ABNORMAL HIGH (ref 78.0–100.0)
MCV: 91.7 fL (ref 78.0–100.0)
MCV: 93 fL (ref 78.0–100.0)
MCV: 93.6 fL (ref 78.0–100.0)
MCV: 93.8 fL (ref 78.0–100.0)
MCV: 94.4 fL (ref 78.0–100.0)
MCV: 94.4 fL (ref 78.0–100.0)
MCV: 94.6 fL (ref 78.0–100.0)
MCV: 95.1 fL (ref 78.0–100.0)
MCV: 95.3 fL (ref 78.0–100.0)
Platelets: 101 10*3/uL — ABNORMAL LOW (ref 150–400)
Platelets: 102 10*3/uL — ABNORMAL LOW (ref 150–400)
Platelets: 107 10*3/uL — ABNORMAL LOW (ref 150–400)
Platelets: 128 10*3/uL — ABNORMAL LOW (ref 150–400)
Platelets: 135 10*3/uL — ABNORMAL LOW (ref 150–400)
Platelets: 141 10*3/uL — ABNORMAL LOW (ref 150–400)
Platelets: 157 10*3/uL (ref 150–400)
Platelets: 161 10*3/uL (ref 150–400)
Platelets: 171 10*3/uL (ref 150–400)
Platelets: 182 10*3/uL (ref 150–400)
Platelets: 213 10*3/uL (ref 150–400)
RBC: 2.09 MIL/uL — ABNORMAL LOW (ref 4.22–5.81)
RBC: 2.38 MIL/uL — ABNORMAL LOW (ref 4.22–5.81)
RBC: 2.55 MIL/uL — ABNORMAL LOW (ref 4.22–5.81)
RBC: 2.61 MIL/uL — ABNORMAL LOW (ref 4.22–5.81)
RBC: 2.63 MIL/uL — ABNORMAL LOW (ref 4.22–5.81)
RBC: 2.7 MIL/uL — ABNORMAL LOW (ref 4.22–5.81)
RBC: 2.73 MIL/uL — ABNORMAL LOW (ref 4.22–5.81)
RBC: 2.76 MIL/uL — ABNORMAL LOW (ref 4.22–5.81)
RBC: 2.79 MIL/uL — ABNORMAL LOW (ref 4.22–5.81)
RBC: 2.79 MIL/uL — ABNORMAL LOW (ref 4.22–5.81)
RBC: 3.06 MIL/uL — ABNORMAL LOW (ref 4.22–5.81)
RBC: 3.19 MIL/uL — ABNORMAL LOW (ref 4.22–5.81)
RBC: 3.46 MIL/uL — ABNORMAL LOW (ref 4.22–5.81)
RBC: 3.76 MIL/uL — ABNORMAL LOW (ref 4.22–5.81)
RDW: 14.3 % (ref 11.5–15.5)
RDW: 14.4 % (ref 11.5–15.5)
RDW: 14.4 % (ref 11.5–15.5)
RDW: 14.6 % (ref 11.5–15.5)
RDW: 14.8 % (ref 11.5–15.5)
RDW: 14.9 % (ref 11.5–15.5)
RDW: 15.4 % (ref 11.5–15.5)
RDW: 15.8 % — ABNORMAL HIGH (ref 11.5–15.5)
RDW: 17.5 % — ABNORMAL HIGH (ref 11.5–15.5)
RDW: 17.9 % — ABNORMAL HIGH (ref 11.5–15.5)
WBC: 15.5 10*3/uL — ABNORMAL HIGH (ref 4.0–10.5)
WBC: 15.8 10*3/uL — ABNORMAL HIGH (ref 4.0–10.5)
WBC: 16 10*3/uL — ABNORMAL HIGH (ref 4.0–10.5)
WBC: 20.1 10*3/uL — ABNORMAL HIGH (ref 4.0–10.5)
WBC: 20.2 10*3/uL — ABNORMAL HIGH (ref 4.0–10.5)
WBC: 20.7 10*3/uL — ABNORMAL HIGH (ref 4.0–10.5)
WBC: 20.9 10*3/uL — ABNORMAL HIGH (ref 4.0–10.5)
WBC: 21.2 10*3/uL — ABNORMAL HIGH (ref 4.0–10.5)
WBC: 22.6 10*3/uL — ABNORMAL HIGH (ref 4.0–10.5)
WBC: 25.8 10*3/uL — ABNORMAL HIGH (ref 4.0–10.5)
WBC: 29.5 10*3/uL — ABNORMAL HIGH (ref 4.0–10.5)
WBC: 8.4 10*3/uL (ref 4.0–10.5)

## 2010-08-22 LAB — URINALYSIS, MICROSCOPIC ONLY
Glucose, UA: NEGATIVE mg/dL
Ketones, ur: 15 mg/dL — AB
Nitrite: POSITIVE — AB
Protein, ur: NEGATIVE mg/dL
Urobilinogen, UA: 4 mg/dL — ABNORMAL HIGH (ref 0.0–1.0)

## 2010-08-22 LAB — POCT I-STAT 3, ART BLOOD GAS (G3+)
Acid-Base Excess: 1 mmol/L (ref 0.0–2.0)
Acid-base deficit: 1 mmol/L (ref 0.0–2.0)
Acid-base deficit: 7 mmol/L — ABNORMAL HIGH (ref 0.0–2.0)
Acid-base deficit: 8 mmol/L — ABNORMAL HIGH (ref 0.0–2.0)
Bicarbonate: 14 mEq/L — ABNORMAL LOW (ref 20.0–24.0)
Bicarbonate: 14.8 mEq/L — ABNORMAL LOW (ref 20.0–24.0)
Bicarbonate: 15.5 mEq/L — ABNORMAL LOW (ref 20.0–24.0)
Bicarbonate: 20.9 mEq/L (ref 20.0–24.0)
Bicarbonate: 24.3 mEq/L — ABNORMAL HIGH (ref 20.0–24.0)
O2 Saturation: 100 %
O2 Saturation: 95 %
O2 Saturation: 98 %
O2 Saturation: 99 %
Patient temperature: 98.6
Patient temperature: 99.1
Patient temperature: 99.7
TCO2: 15 mmol/L (ref 0–100)
TCO2: 16 mmol/L (ref 0–100)
TCO2: 22 mmol/L (ref 0–100)
TCO2: 23 mmol/L (ref 0–100)
TCO2: 25 mmol/L (ref 0–100)
pCO2 arterial: 22 mmHg — ABNORMAL LOW (ref 35.0–45.0)
pCO2 arterial: 25.2 mmHg — ABNORMAL LOW (ref 35.0–45.0)
pCO2 arterial: 28.9 mmHg — ABNORMAL LOW (ref 35.0–45.0)
pH, Arterial: 7.353 (ref 7.350–7.450)
pH, Arterial: 7.475 — ABNORMAL HIGH (ref 7.350–7.450)
pH, Arterial: 7.502 — ABNORMAL HIGH (ref 7.350–7.450)
pO2, Arterial: 144 mmHg — ABNORMAL HIGH (ref 80.0–100.0)
pO2, Arterial: 151 mmHg — ABNORMAL HIGH (ref 80.0–100.0)

## 2010-08-22 LAB — BASIC METABOLIC PANEL
BUN: 14 mg/dL (ref 6–23)
BUN: 23 mg/dL (ref 6–23)
BUN: 26 mg/dL — ABNORMAL HIGH (ref 6–23)
BUN: 36 mg/dL — ABNORMAL HIGH (ref 6–23)
BUN: 38 mg/dL — ABNORMAL HIGH (ref 6–23)
CO2: 16 mEq/L — ABNORMAL LOW (ref 19–32)
CO2: 19 mEq/L (ref 19–32)
CO2: 23 mEq/L (ref 19–32)
CO2: 24 mEq/L (ref 19–32)
CO2: 24 mEq/L (ref 19–32)
CO2: 29 mEq/L (ref 19–32)
CO2: 32 mEq/L (ref 19–32)
Calcium: 7 mg/dL — ABNORMAL LOW (ref 8.4–10.5)
Calcium: 7.8 mg/dL — ABNORMAL LOW (ref 8.4–10.5)
Calcium: 8.2 mg/dL — ABNORMAL LOW (ref 8.4–10.5)
Calcium: 8.9 mg/dL (ref 8.4–10.5)
Chloride: 106 mEq/L (ref 96–112)
Chloride: 106 mEq/L (ref 96–112)
Chloride: 108 mEq/L (ref 96–112)
Chloride: 109 mEq/L (ref 96–112)
Chloride: 109 mEq/L (ref 96–112)
Chloride: 114 mEq/L — ABNORMAL HIGH (ref 96–112)
Creatinine, Ser: 1.2 mg/dL (ref 0.4–1.5)
Creatinine, Ser: 1.37 mg/dL (ref 0.4–1.5)
Creatinine, Ser: 1.7 mg/dL — ABNORMAL HIGH (ref 0.4–1.5)
Creatinine, Ser: 2.06 mg/dL — ABNORMAL HIGH (ref 0.4–1.5)
Creatinine, Ser: 2.45 mg/dL — ABNORMAL HIGH (ref 0.4–1.5)
GFR calc Af Amer: 34 mL/min — ABNORMAL LOW (ref >60)
GFR calc Af Amer: 37 mL/min — ABNORMAL LOW (ref >60)
GFR calc Af Amer: 45 mL/min — ABNORMAL LOW (ref >60)
GFR calc Af Amer: 59 mL/min — ABNORMAL LOW (ref >60)
GFR calc Af Amer: >60 mL/min (ref >60)
GFR calc non Af Amer: 37 mL/min — ABNORMAL LOW (ref >60)
Glucose, Bld: 115 mg/dL — ABNORMAL HIGH (ref 70–99)
Glucose, Bld: 123 mg/dL — ABNORMAL HIGH (ref 70–99)
Glucose, Bld: 127 mg/dL — ABNORMAL HIGH (ref 70–99)
Glucose, Bld: 93 mg/dL (ref 70–99)
Potassium: 3.8 mEq/L (ref 3.5–5.1)
Potassium: 4 mEq/L (ref 3.5–5.1)
Potassium: 4.1 mEq/L (ref 3.5–5.1)
Potassium: 4.4 mEq/L (ref 3.5–5.1)
Potassium: 4.4 mEq/L (ref 3.5–5.1)
Sodium: 134 mEq/L — ABNORMAL LOW (ref 135–145)
Sodium: 137 mEq/L (ref 135–145)
Sodium: 138 mEq/L (ref 135–145)
Sodium: 138 mEq/L (ref 135–145)

## 2010-08-22 LAB — COMPREHENSIVE METABOLIC PANEL
ALT: 54 U/L — ABNORMAL HIGH (ref 0–53)
ALT: 68 U/L — ABNORMAL HIGH (ref 0–53)
ALT: 72 U/L — ABNORMAL HIGH (ref 0–53)
AST: 147 U/L — ABNORMAL HIGH (ref 0–37)
AST: 54 U/L — ABNORMAL HIGH (ref 0–37)
AST: 74 U/L — ABNORMAL HIGH (ref 0–37)
Albumin: 1.7 g/dL — ABNORMAL LOW (ref 3.5–5.2)
Albumin: 2.1 g/dL — ABNORMAL LOW (ref 3.5–5.2)
Albumin: 2.2 g/dL — ABNORMAL LOW (ref 3.5–5.2)
Albumin: 2.4 g/dL — ABNORMAL LOW (ref 3.5–5.2)
Alkaline Phosphatase: 108 U/L (ref 39–117)
Alkaline Phosphatase: 131 U/L — ABNORMAL HIGH (ref 39–117)
Alkaline Phosphatase: 143 U/L — ABNORMAL HIGH (ref 39–117)
Alkaline Phosphatase: 166 U/L — ABNORMAL HIGH (ref 39–117)
BUN: 18 mg/dL (ref 6–23)
BUN: 28 mg/dL — ABNORMAL HIGH (ref 6–23)
BUN: 39 mg/dL — ABNORMAL HIGH (ref 6–23)
CO2: 21 mEq/L (ref 19–32)
CO2: 22 mEq/L (ref 19–32)
CO2: 23 mEq/L (ref 19–32)
CO2: 24 mEq/L (ref 19–32)
CO2: 24 mEq/L (ref 19–32)
Calcium: 7.8 mg/dL — ABNORMAL LOW (ref 8.4–10.5)
Calcium: 8.3 mg/dL — ABNORMAL LOW (ref 8.4–10.5)
Calcium: 8.4 mg/dL (ref 8.4–10.5)
Chloride: 104 mEq/L (ref 96–112)
Chloride: 108 mEq/L (ref 96–112)
Chloride: 114 mEq/L — ABNORMAL HIGH (ref 96–112)
Chloride: 115 mEq/L — ABNORMAL HIGH (ref 96–112)
Chloride: 118 mEq/L — ABNORMAL HIGH (ref 96–112)
Creatinine, Ser: 1.5 mg/dL (ref 0.4–1.5)
Creatinine, Ser: 1.63 mg/dL — ABNORMAL HIGH (ref 0.4–1.5)
Creatinine, Ser: 1.73 mg/dL — ABNORMAL HIGH (ref 0.4–1.5)
Creatinine, Ser: 1.77 mg/dL — ABNORMAL HIGH (ref 0.4–1.5)
Creatinine, Ser: 1.86 mg/dL — ABNORMAL HIGH (ref 0.4–1.5)
GFR calc Af Amer: 41 mL/min — ABNORMAL LOW (ref >60)
GFR calc Af Amer: 44 mL/min — ABNORMAL LOW (ref >60)
GFR calc Af Amer: 45 mL/min — ABNORMAL LOW (ref >60)
GFR calc Af Amer: 48 mL/min — ABNORMAL LOW (ref >60)
GFR calc non Af Amer: 34 mL/min — ABNORMAL LOW (ref >60)
GFR calc non Af Amer: 37 mL/min — ABNORMAL LOW (ref >60)
GFR calc non Af Amer: 44 mL/min — ABNORMAL LOW (ref >60)
Glucose, Bld: 111 mg/dL — ABNORMAL HIGH (ref 70–99)
Glucose, Bld: 118 mg/dL — ABNORMAL HIGH (ref 70–99)
Glucose, Bld: 121 mg/dL — ABNORMAL HIGH (ref 70–99)
Glucose, Bld: 83 mg/dL (ref 70–99)
Potassium: 3.3 mEq/L — ABNORMAL LOW (ref 3.5–5.1)
Potassium: 3.3 mEq/L — ABNORMAL LOW (ref 3.5–5.1)
Potassium: 3.5 mEq/L (ref 3.5–5.1)
Sodium: 140 mEq/L (ref 135–145)
Sodium: 146 mEq/L — ABNORMAL HIGH (ref 135–145)
Sodium: 146 mEq/L — ABNORMAL HIGH (ref 135–145)
Sodium: 147 mEq/L — ABNORMAL HIGH (ref 135–145)
Sodium: 148 mEq/L — ABNORMAL HIGH (ref 135–145)
Total Bilirubin: 7 mg/dL — ABNORMAL HIGH (ref 0.3–1.2)
Total Bilirubin: 7.4 mg/dL — ABNORMAL HIGH (ref 0.3–1.2)
Total Bilirubin: 9.4 mg/dL — ABNORMAL HIGH (ref 0.3–1.2)
Total Protein: 5.5 g/dL — ABNORMAL LOW (ref 6.0–8.3)
Total Protein: 6.1 g/dL (ref 6.0–8.3)

## 2010-08-22 LAB — DIFFERENTIAL
Basophils Absolute: 0 10*3/uL (ref 0.0–0.1)
Basophils Relative: 0 % (ref 0–1)
Basophils Relative: 0 % (ref 0–1)
Basophils Relative: 0 % (ref 0–1)
Eosinophils Absolute: 0.1 10*3/uL (ref 0.0–0.7)
Eosinophils Absolute: 0.1 10*3/uL (ref 0.0–0.7)
Eosinophils Absolute: 0.2 10*3/uL (ref 0.0–0.7)
Eosinophils Relative: 1 % (ref 0–5)
Eosinophils Relative: 1 % (ref 0–5)
Lymphocytes Relative: 3 % — ABNORMAL LOW (ref 12–46)
Lymphocytes Relative: 6 % — ABNORMAL LOW (ref 12–46)
Lymphs Abs: 0.7 10*3/uL (ref 0.7–4.0)
Lymphs Abs: 1 10*3/uL (ref 0.7–4.0)
Monocytes Absolute: 0.7 10*3/uL (ref 0.1–1.0)
Monocytes Absolute: 0.8 10*3/uL (ref 0.1–1.0)
Monocytes Absolute: 0.8 10*3/uL (ref 0.1–1.0)
Monocytes Absolute: 1.2 10*3/uL — ABNORMAL HIGH (ref 0.1–1.0)
Monocytes Relative: 3 % (ref 3–12)
Monocytes Relative: 5 % (ref 3–12)
Monocytes Relative: 5 % (ref 3–12)
Monocytes Relative: 6 % (ref 3–12)
Monocytes Relative: 6 % (ref 3–12)
Neutro Abs: 18.9 10*3/uL — ABNORMAL HIGH (ref 1.7–7.7)
Neutrophils Relative %: 87 % — ABNORMAL HIGH (ref 43–77)
Neutrophils Relative %: 91 % — ABNORMAL HIGH (ref 43–77)
Neutrophils Relative %: 92 % — ABNORMAL HIGH (ref 43–77)

## 2010-08-22 LAB — URINALYSIS, ROUTINE W REFLEX MICROSCOPIC
Bilirubin Urine: NEGATIVE
Glucose, UA: NEGATIVE mg/dL
Hgb urine dipstick: NEGATIVE
Ketones, ur: NEGATIVE mg/dL
Nitrite: NEGATIVE
Protein, ur: NEGATIVE mg/dL
Specific Gravity, Urine: 1.008 (ref 1.005–1.030)
Urobilinogen, UA: 0.2 mg/dL (ref 0.0–1.0)

## 2010-08-22 LAB — CROSSMATCH
ABO/RH(D): O POS
Antibody Screen: NEGATIVE
Antibody Screen: NEGATIVE

## 2010-08-22 LAB — HEPATIC FUNCTION PANEL
ALT: 14 U/L (ref 0–53)
ALT: 34 U/L (ref 0–53)
AST: 28 U/L (ref 0–37)
Alkaline Phosphatase: 71 U/L (ref 39–117)
Bilirubin, Direct: 0.2 mg/dL (ref 0.0–0.3)
Bilirubin, Direct: 3.2 mg/dL — ABNORMAL HIGH (ref 0.0–0.3)
Indirect Bilirubin: 0.5 mg/dL (ref 0.3–0.9)
Total Bilirubin: 0.7 mg/dL (ref 0.3–1.2)
Total Bilirubin: 5.4 mg/dL — ABNORMAL HIGH (ref 0.3–1.2)

## 2010-08-22 LAB — MAGNESIUM
Magnesium: 1.4 mg/dL — ABNORMAL LOW (ref 1.5–2.5)
Magnesium: 1.5 mg/dL (ref 1.5–2.5)
Magnesium: 1.8 mg/dL (ref 1.5–2.5)
Magnesium: 1.9 mg/dL (ref 1.5–2.5)
Magnesium: 2.4 mg/dL (ref 1.5–2.5)

## 2010-08-22 LAB — POCT CARDIAC MARKERS
CKMB, poc: 2 ng/mL (ref 1.0–8.0)
Myoglobin, poc: 88.9 ng/mL (ref 12–200)

## 2010-08-22 LAB — CLOSTRIDIUM DIFFICILE EIA

## 2010-08-22 LAB — PREPARE RBC (CROSSMATCH)

## 2010-08-22 LAB — CULTURE, BLOOD (ROUTINE X 2)

## 2010-08-22 LAB — MRSA PCR SCREENING: MRSA by PCR: NEGATIVE

## 2010-08-22 LAB — PHOSPHORUS: Phosphorus: 2.8 mg/dL (ref 2.3–4.6)

## 2010-08-22 LAB — URINE CULTURE: Colony Count: NO GROWTH

## 2010-08-22 LAB — URINE MICROSCOPIC-ADD ON

## 2010-08-22 LAB — GLUCOSE, CAPILLARY: Glucose-Capillary: 74 mg/dL (ref 70–99)

## 2010-08-22 LAB — LIPASE, BLOOD: Lipase: 21 U/L (ref 11–59)

## 2010-08-22 LAB — BRAIN NATRIURETIC PEPTIDE: Pro B Natriuretic peptide (BNP): 391 pg/mL — ABNORMAL HIGH (ref 0.0–100.0)

## 2010-08-22 LAB — POCT I-STAT, CHEM 8
Calcium, Ion: 1.19 mmol/L (ref 1.12–1.32)
Glucose, Bld: 142 mg/dL — ABNORMAL HIGH (ref 70–99)

## 2010-08-22 LAB — LIPID PANEL: Cholesterol: 74 mg/dL (ref 0–200)

## 2010-08-22 LAB — TROPONIN I: Troponin I: 0.07 ng/mL — ABNORMAL HIGH (ref 0.00–0.06)

## 2010-08-22 LAB — CULTURE, BAL-QUANTITATIVE W GRAM STAIN

## 2010-08-22 LAB — PROTIME-INR: INR: 1.4 (ref 0.00–1.49)

## 2010-08-26 LAB — DIFFERENTIAL
Basophils Absolute: 0 10*3/uL (ref 0.0–0.1)
Basophils Relative: 0 % (ref 0–1)
Eosinophils Absolute: 0.1 10*3/uL (ref 0.0–0.7)
Eosinophils Absolute: 0.3 10*3/uL (ref 0.0–0.7)
Eosinophils Relative: 1 % (ref 0–5)
Eosinophils Relative: 2 % (ref 0–5)
Lymphocytes Relative: 5 % — ABNORMAL LOW (ref 12–46)
Lymphocytes Relative: 6 % — ABNORMAL LOW (ref 12–46)
Lymphs Abs: 0.8 10*3/uL (ref 0.7–4.0)
Lymphs Abs: 1 10*3/uL (ref 0.7–4.0)
Monocytes Absolute: 0.9 10*3/uL (ref 0.1–1.0)
Monocytes Absolute: 1 10*3/uL (ref 0.1–1.0)
Monocytes Relative: 5 % (ref 3–12)
Monocytes Relative: 6 % (ref 3–12)
Neutro Abs: 16.7 10*3/uL — ABNORMAL HIGH (ref 1.7–7.7)
Neutrophils Relative %: 90 % — ABNORMAL HIGH (ref 43–77)

## 2010-08-26 LAB — COMPREHENSIVE METABOLIC PANEL WITH GFR
ALT: 70 U/L — ABNORMAL HIGH (ref 0–53)
ALT: 72 U/L — ABNORMAL HIGH (ref 0–53)
ALT: 73 U/L — ABNORMAL HIGH (ref 0–53)
AST: 69 U/L — ABNORMAL HIGH (ref 0–37)
AST: 69 U/L — ABNORMAL HIGH (ref 0–37)
AST: 85 U/L — ABNORMAL HIGH (ref 0–37)
Albumin: 2.3 g/dL — ABNORMAL LOW (ref 3.5–5.2)
Albumin: 2.3 g/dL — ABNORMAL LOW (ref 3.5–5.2)
Albumin: 2.5 g/dL — ABNORMAL LOW (ref 3.5–5.2)
Alkaline Phosphatase: 111 U/L (ref 39–117)
Alkaline Phosphatase: 114 U/L (ref 39–117)
Alkaline Phosphatase: 115 U/L (ref 39–117)
BUN: 11 mg/dL (ref 6–23)
BUN: 11 mg/dL (ref 6–23)
BUN: 12 mg/dL (ref 6–23)
CO2: 22 meq/L (ref 19–32)
CO2: 22 meq/L (ref 19–32)
CO2: 22 meq/L (ref 19–32)
Calcium: 8.1 mg/dL — ABNORMAL LOW (ref 8.4–10.5)
Calcium: 8.3 mg/dL — ABNORMAL LOW (ref 8.4–10.5)
Calcium: 8.5 mg/dL (ref 8.4–10.5)
Chloride: 110 meq/L (ref 96–112)
Chloride: 111 meq/L (ref 96–112)
Chloride: 113 meq/L — ABNORMAL HIGH (ref 96–112)
Creatinine, Ser: 1.28 mg/dL (ref 0.4–1.5)
Creatinine, Ser: 1.32 mg/dL (ref 0.4–1.5)
Creatinine, Ser: 1.34 mg/dL (ref 0.4–1.5)
GFR calc non Af Amer: 50 mL/min — ABNORMAL LOW
GFR calc non Af Amer: 51 mL/min — ABNORMAL LOW
GFR calc non Af Amer: 53 mL/min — ABNORMAL LOW
Glucose, Bld: 105 mg/dL — ABNORMAL HIGH (ref 70–99)
Glucose, Bld: 107 mg/dL — ABNORMAL HIGH (ref 70–99)
Glucose, Bld: 92 mg/dL (ref 70–99)
Potassium: 3.5 meq/L (ref 3.5–5.1)
Potassium: 3.6 meq/L (ref 3.5–5.1)
Potassium: 3.6 meq/L (ref 3.5–5.1)
Sodium: 138 meq/L (ref 135–145)
Sodium: 138 meq/L (ref 135–145)
Sodium: 139 meq/L (ref 135–145)
Total Bilirubin: 3.2 mg/dL — ABNORMAL HIGH (ref 0.3–1.2)
Total Bilirubin: 3.2 mg/dL — ABNORMAL HIGH (ref 0.3–1.2)
Total Bilirubin: 3.3 mg/dL — ABNORMAL HIGH (ref 0.3–1.2)
Total Protein: 5.8 g/dL — ABNORMAL LOW (ref 6.0–8.3)
Total Protein: 6 g/dL (ref 6.0–8.3)
Total Protein: 6.4 g/dL (ref 6.0–8.3)

## 2010-08-26 LAB — BASIC METABOLIC PANEL
CO2: 23 mEq/L (ref 19–32)
Calcium: 8 mg/dL — ABNORMAL LOW (ref 8.4–10.5)
Chloride: 115 mEq/L — ABNORMAL HIGH (ref 96–112)
Creatinine, Ser: 1.2 mg/dL (ref 0.4–1.5)
GFR calc Af Amer: >60 mL/min (ref >60)
Sodium: 141 mEq/L (ref 135–145)

## 2010-08-26 LAB — MAGNESIUM
Magnesium: 1.7 mg/dL (ref 1.5–2.5)
Magnesium: 1.7 mg/dL (ref 1.5–2.5)

## 2010-08-26 LAB — CBC
HCT: 28.6 % — ABNORMAL LOW (ref 39.0–52.0)
HCT: 32 % — ABNORMAL LOW (ref 39.0–52.0)
Hemoglobin: 10.8 g/dL — ABNORMAL LOW (ref 13.0–17.0)
Hemoglobin: 9.5 g/dL — ABNORMAL LOW (ref 13.0–17.0)
Hemoglobin: 9.9 g/dL — ABNORMAL LOW (ref 13.0–17.0)
MCHC: 33.7 g/dL (ref 30.0–36.0)
MCHC: 33.8 g/dL (ref 30.0–36.0)
MCHC: 34.4 g/dL (ref 30.0–36.0)
MCV: 95.1 fL (ref 78.0–100.0)
MCV: 96.3 fL (ref 78.0–100.0)
Platelets: 240 10*3/uL (ref 150–400)
Platelets: 252 10*3/uL (ref 150–400)
Platelets: 256 10*3/uL (ref 150–400)
RBC: 2.95 MIL/uL — ABNORMAL LOW (ref 4.22–5.81)
RBC: 3.01 MIL/uL — ABNORMAL LOW (ref 4.22–5.81)
RBC: 3.13 MIL/uL — ABNORMAL LOW (ref 4.22–5.81)
RDW: 17.5 % — ABNORMAL HIGH (ref 11.5–15.5)
RDW: 18.9 % — ABNORMAL HIGH (ref 11.5–15.5)
WBC: 12.6 10*3/uL — ABNORMAL HIGH (ref 4.0–10.5)
WBC: 18.7 10*3/uL — ABNORMAL HIGH (ref 4.0–10.5)

## 2010-09-18 ENCOUNTER — Encounter: Payer: Self-pay | Admitting: Pulmonary Disease

## 2010-09-20 ENCOUNTER — Encounter: Payer: Self-pay | Admitting: Pulmonary Disease

## 2010-09-20 ENCOUNTER — Ambulatory Visit (INDEPENDENT_AMBULATORY_CARE_PROVIDER_SITE_OTHER): Payer: Medicare Other | Admitting: Pulmonary Disease

## 2010-09-20 DIAGNOSIS — K5731 Diverticulosis of large intestine without perforation or abscess with bleeding: Secondary | ICD-10-CM

## 2010-09-20 DIAGNOSIS — M1990 Unspecified osteoarthritis, unspecified site: Secondary | ICD-10-CM

## 2010-09-20 DIAGNOSIS — Z8546 Personal history of malignant neoplasm of prostate: Secondary | ICD-10-CM

## 2010-09-20 DIAGNOSIS — I2510 Atherosclerotic heart disease of native coronary artery without angina pectoris: Secondary | ICD-10-CM

## 2010-09-20 DIAGNOSIS — I10 Essential (primary) hypertension: Secondary | ICD-10-CM

## 2010-09-20 DIAGNOSIS — I442 Atrioventricular block, complete: Secondary | ICD-10-CM

## 2010-09-20 NOTE — Patient Instructions (Signed)
We completed the [facility] paperwork for "Regular Aid and Attendance"... Let me know if I can be of further assistance in any way... Keep your regularly scheduled follow up appt in July.Marland KitchenMarland Kitchen

## 2010-09-20 NOTE — Progress Notes (Signed)
Subjective:    Patient ID: Edward Harvey, male    DOB: Mar 05, 1916, 77 y.o.   MRN: [id number]  HPI 77 y/o BM here for a follow up visit... he has multiple medical problems including HBP, CAD, & complete heart block w/ pacer followed by DrWall & DrKlein;  hx of GI bleed from divertics w/ subtotal colectomy in 2003 & followed by DrPatterson;  hx SBO, inguinal hernias, gallstones;  hx prostate cancer followed by DrOttelin;  DJD & Gout;  hx subdural hematoma prev requiring burr hole, & mild senile dementia...  ~  [id number]:  he was recently at his podiatrist office having his toenails trimmed & he mentioned that [name] needed to be checked for DM... the pt became concerned & despite the fact that he is not diabetic & all BS checks in the past have been normal, he didn't want to wait until his next f/u visit in Jan12, so he asked for this work-in appt... we discussed DM & agreed to check BMet (norm- BS=79) & an A1c (5.2)> he is reassured.  ~  June 21, 2010:  77 y/o & doing satis w/ multisys dis> he saw DrWall recently for f/u CAD & pacer for CHB- stable, same meds, f/u 6yr... he denies resp symptoms w/o cough, sputum, incr SOB;  denies CP, palpit, ch in edema;  no abd pain, swelling, n/v, or blood seen;  due for f/u lab work> same meds...  ~  September 20, 2010:  Here w/ his son for an add-on appt to complete some papers for the VA> pt & his wife still live indepedently but are having increased difficulty according to son;  They are applying for VA Aid & Attendance benefits wanting someone to come into the home to essentially clean & cook for them> the forms were completed w/ the help of pt & his son;  Medically [name] is stable> HOH w/o change; Breathing stable & chest clear; Denies CP/ palpit/ ch in DOE, edema, etc; GI is stable as well, and CC= DJD w/ difficulty ambulating etc...        Problem List:    GLAUCOMA (ICD-365.9) - prev followed by Deland Pretty w/ bilat cataract surg & glaucoma on eye 3 diff drops as  noted...  HEARING LOSS - son says he has 3 sets of hearing aides but won't wear any of them...  Hx of ASTHMATIC BRONCHITIS, ACUTE (ICD-466.0) - he has never smoked... hx recurrent bronchitic infections over the yrs w/ reactive airway component... no recent prob, and no regular meds required.   HYPERTENSION (ICD-401.9) - on ASA 81mg /d, COREG 6.25Bid, QUINAPRIL 20mg Bid, LASIX 20mg /d... He takes his own meds & states no problems. ~  4/12:  BP today = 110/74 & feeling well... he denies HA, visual changes, CP, palipit, dizziness, syncope, change in dyspnea, etc...   CORONARY ARTERY DISEASE (ICD-414.00) - followed by DrWall for Cardiology on above meds... pt has not had a prev cath... ~  2DEcho 4/05 showed mild conc LVH and norm LVF- improved from 2002 when EF= 45%... ~  NuclearStressTest 10/06 showed prior inferoseptal & apical infarct w/ apical AK, w/o ischemia, EF= 48%... no change from prev.  Hx of AV BLOCK, COMPLETE (ICD-426.0), & CARDIAC PACEMAKER IN SITU (ICD-V45.01) - he had a pacemaker placed for complete heart block initially in 1994, & this was exchanged for a new dual chamber pacer: Valene Bors DDDR model [id number] by [facility] 6/06... ~  seen by DrKlein 1/10 w/ pacer check OK, no further episodes of  AFib on his monitor... not a Coumadin candidate due to 77 age, unsteady, hx of GI bleeds, and prev subdural hematoma. ~  followed regularly by DrKlein's pacer clinic> stable, doing well, no changes made.  DIVERTICULOSIS OF COLON WITH HEMORRHAGE (ICD-562.12)  GASTROINTESTINAL HEMORRHAGE (ICD-578.9) - he had a subtotal colectomy w/ ileum to distal sigmoid anastomosis in Jul03 by DrNewman... ~  EGD 8/03 by Dorris Singh was normal... ~  colonoscopy 1/08 by DrPatterson showed inflammed mucosa in sm bowel prox to the ileo-colonic anastomosis, few divertics in remaining distal colon, no polyps etc...  ~  f/u colonoscopy 5/09 showed prev colectomy- anastomosis granular & bleeding ?Crohn's... also had divertics &  hems... Rx'd w/ Lialda.  Hx of SMALL BOWEL OBSTRUCTION (ICD-560.9) > see [id number] Hospitalization by [facility]...  INGUINAL HERNIA (ICD-550.90) - he has bilat fat containing inguinal hernias seen on CT Abd in 2008...   GALLSTONES (ICD-574.20) - Sonar 4/11 showed mult gallstones filling the gallbladder, and CT Abd 4/11 showed mild extrahep biliary ductal dilatation... he was evaluated by DrPatterson/ GI, DrNewman/ Surg, DrWall/ Cards> all agreed that risk was hi & to hold off on surg unless absolutely necessary... ~  75/12:  He remains asymptomatic w/o abd pain, N/V, etc...  CARCINOMA, PROSTATE, HX OF (ICD-V10.46) - diagnosed in 1993 & treated w/ XRT... PSA initially ~29 and dropped to ~1 after XRT & it has remained there ever since... followed by DrOttelin- also has BPH, min obstructive symptoms, bilat hydroceles & some benign renal cysts... ~  labs 3/07 by Urology showed PSA= 1.19 ~  labs here 1/10 showed PSA= 1.37 ~  5/11: he reports f/u DrOttelin w/ incontinence symptoms Rx'd Sanctura XR 60mg /d & improved, so he stopped this on his own & states that he is doing satis now...  DEGENERATIVE JOINT DISEASE (ICD-715.90)  Hx of GOUT (ICD-274.9) - he has hx of DJD and Gout treated w/ TRAMADOL Prn & ALLOPURINOL 300mg /d... also takes MVI & VIT D 1000 u daily...  HEMATOMA, SUBDURAL (ICD-432.1) - he had a burr hole placed for subdural hematoma in the past...    Past Surgical History  Procedure Date  . Pacemaker placement   . Cataract extraction, bilateral   . Tonsillectomy age 9  . Subtotal colectomy 12/03    Dr. Ezzard Standing  . Lipoma excision 2001    left leg    Outpatient Encounter Prescriptions as of 09/20/2010  Medication Sig Dispense Refill  . allopurinol (ZYLOPRIM) 300 MG tablet Take 300 mg by mouth daily.        Marland Kitchen aspirin 81 MG chewable tablet Chew 81 mg by mouth daily.        . brimonidine (ALPHAGAN P) 0.1 % SOLN Apply 1 drop to eye 3 (three) times daily.        . carvedilol (COREG) 6.25 MG  tablet Take 6.25 mg by mouth 2 (two) times daily.        . Cholecalciferol (VITAMIN D) 1000 UNITS capsule Take 1,000 Units by mouth daily.        . dorzolamide-timolol (COSOPT) 22.3-6.8 MG/ML ophthalmic solution Place 1 drop into both eyes 2 (two) times daily.        . furosemide (LASIX) 20 MG tablet Take 20 mg by mouth daily.        . Multiple Vitamins-Minerals (CENTRUM SILVER PO) Take 1 tablet by mouth daily.        . quinapril (ACCUPRIL) 20 MG tablet Take 20 mg by mouth 2 (two) times daily.        Marland Kitchen  latanoprost (XALATAN) 0.005 % ophthalmic solution Off now, he says...  this eye drop was apparently stopped by Ophthalmology...      No Known Allergies   Review of Systems        See HPI - all other systems neg except as noted... The patient complains of dyspnea on exertion, peripheral edema, muscle weakness, and difficulty walking.  The patient denies anorexia, fever, weight loss, weight gain, hoarseness, chest pain, syncope, prolonged cough, headaches, hemoptysis, abdominal pain, melena, hematochezia, severe indigestion/heartburn, hematuria, incontinence, suspicious skin lesions, transient blindness, depression, unusual weight change, abnormal bleeding, enlarged lymph nodes, and angioedema.     Objective:   Physical Exam     WD, overweight, 77 y/o BM in NAD...  GENERAL:  Alert, pleasant & cooperative;  VS- reviewed... HEENT:  [place]/AT, EOM- sl strabismus, PERRLA, EACs-clear (HOH), TMs-wnl, NOSE-clear discharge , THROAT-clear & wnl. NECK:  Supple w/ decrROM; no JVD; normal carotid impulses w/o bruits; no thyromegaly or nodules palpated; no lymphadenopathy. CHEST:  Clear to P & A; without wheezes/ rales/ or rhonchi heard;  Pacer palp in right chest... HEART:  Regular Rhythm; without murmurs/ rubs/ or gallops detected... ABDOMEN:  Scar of prev surg, soft & nontender; normal bowel sounds; no organomegaly or masses palpated...  EXT:  Mod-severe arthritic changes; no varicose veins/ +venous  insuffic/ tr edema. NEURO:  CN's intact; motor testing normal; sensory testing inconsistant; gait is abnormal & balance only fair... DERM:  No lesions noted; no rash etc...   Assessment & Plan:   FORM COMPLETION>  We completed the VA Aid & Attendance form as requested...  HBP>  BP stable, continue same meds...  CAD, PACER>  He denies angina, palpit, syncope, etc...  Divertics>  He has hx GI hemorrhage in past;  Stable no abd pain, etc...  Gallstones>  He remains asymptomatic...  Prostate Ca>  Followed by drottelin & stable...  DJD>  This is his m,ain issue w/ mobility, self care, remaining independent.Marland KitchenMarland Kitchen

## 2010-10-16 NOTE — Assessment & Plan Note (Signed)
[facility]                         ELECTROPHYSIOLOGY OFFICE NOTE   NAME:Hustead, NAREK KNISS                      MRN:          [id number]  DATE:06/21/2008                            DOB:          1916/02/11    Mr. Gurka is seen in followup for syncope with complete heart block.  He  is status post pacemaker implantation.  He just had his 89st birthday.  He has no major complaints apart from his glaucoma.   MEDICATIONS:  1. Coreg 6.25 b.i.d.  2. Quinapril 20 b.i.d.  3. Allopurinol.  4. Bunch of eye drops.   PHYSICAL EXAMINATION:  VITAL SIGNS:  His blood pressure is 125/82, his  pulse is 62, and his weight was 220, which is down 8 pounds in the last  year.  NECK:  Veins were flat.  LUNGS:  Clear.  HEART:  Sounds were regular.  Pacemaker pocket was well healed.  EXTREMITIES:  Without edema.   Interrogation of St. Jude pulse generator demonstrates a P-wave of 1.2  with impedance of 308 and threshold of 1.25 at 0.5.  There is no  intrinsic ventricular rate.  The impedance was 366 and threshold was  0.625 at 0.5.   IMPRESSION:  1. Complete heart block.  2. Status post pacer for the above.  3. Hypertension - Stable.   Mr. Valentine is stable.  We will plan to see me him again in 6 months' time  for device followup.     Duke Salvia, MD, [facility]  Electronically Signed    SCK/MedQ  DD: 06/21/2008  DT: 06/22/2008  Job #: [id number]

## 2010-10-16 NOTE — Assessment & Plan Note (Signed)
[facility]                         GASTROENTEROLOGY OFFICE NOTE   NAME:Edward Harvey, Edward Harvey                      MRN:          [id number]  DATE:10/06/2007                            DOB:          [birth year]    A couple of weeks ago Ferry had a viral gastroenteritis with diarrhea  and abdominal cramping.  He currently is asymptomatic and really denies  any upper, lower GI or general medical problems.  He sees Dr. Kriste Basque and  is on antihypertensive medication and also allopurinol.  He takes p.r.n.  tramadol for back pain.   I have seen Nathanyel over the years and he has had recurrent  diverticulosis and also has had a previous sigmoid resection.  In the  past he has had GI bleeding related to abuse of NSAIDs and he is  currently staying away from these medications and from Celebrex.  He  currently denies rectal bleeding and his bowels are back to normal.  His  appetite is good and his weight is stable.   He is awake and alert, in no acute distress, appearing younger than his  stated age of 69.  He weighs 228 pounds and blood pressure is 92/62 and  pulse was 60 and regular.  I could not appreciate stigmata of chronic  liver disease.  CHEST:  Clear, he appeared to be in a regular rhythm without murmurs,  gallops or rubs.  ABDOMEN:  Showed lower abdominal incision which was well healed.  There  were no abdominal masses, hepatomegaly or areas of tenderness.  Bowel  sounds were normal.  RECTAL EXAM:  Deferred.  MENTAL STATUS:  Clear.   ASSESSMENT:  Mr. Balles seems to have had probable viral gastroenteritis  which has resolved.  He really is fairly asymptomatic at this point and  seems to be doing well with his general medical status.  He previously  was on anti-inflammatories for nonsteroidal anti-inflammatory drug-  induced gastrointestinal injury, but he denies use of such agents at  this time.   RECOMMENDATION:  1. Check repeat lab data.  2.  Outpatient Hemoccult cards.  3. PRN GI followup as needed.  4. Continue medical followup with Dr. Kriste Basque.     Vania Rea. Jarold Motto, MD, [facility], FAGA  Electronically Signed    DRP/MedQ  DD: 10/06/2007  DT: 10/06/2007  Job #: [id number]   cc:   Lonzo Cloud. Kriste Basque, MD

## 2010-10-16 NOTE — Letter (Signed)
June 10, 2007    Lonzo Cloud. Kriste Basque, MD  [address]  [place] [address]   RE:  REYNOLD, MANTELL  MRN:  [id number]  /  DOB:  [birth year]   Dear Lorin Picket:   Mr. Arif is doing quite well with his pacemaker.  The thing that prompts  this letter is that we are finding more episodes of atrial fibrillation  on his monitor, although, it had been relatively quiescent over the last  couple of months.  Obviously the issue is Coumadin in this very elderly  but otherwise pretty healthy gentleman.  I am not quite sure what the  right answer is, but I wanted to ask you to be thinking about it with me  as we track the frequency of his atrial fibrillation.  He is otherwise  doing quite well.   MEDICATIONS:  1. Coreg 6.25 mg b.i.d.  2. Furosemide.  3. Quinapril 20 mg b.i.d.  4. Tramadol.  5. Cosopt.   PHYSICAL EXAMINATION:  VITAL SIGNS:  Blood pressure remains quite good  at 156/84, pulse 70.  LUNGS:  Clear.  HEART:  Sounds were regular.  EXTREMITIES:  Without edema.   Interrogation of his St. Jude pulse generator demonstrated normal  pacemaker function with a P wave of 2.8, impedance of 310, threshold of  1.5 at 0.5.  There is no intrinsic ventricular rhythm.  The impedance  was 566 and threshold was 0.625 at 0.5.  Battery voltage was 2.97.  As  noted the atrial fibrillation episodes have been intermittent, but have  been quiescent over the last number of months.   IMPRESSION:  1. Complete heart block.  2. Status post pacer for the above.  3. Asymptomatic atrial fibrillation.  4. Thromboembolic risk factors notable for:      a.     Age.      b.     Hypertension.   Mr. Rajan, Lorin Picket, has atrial fibrillation.  I would like to have you  thinking about it with me and Maisie Fus C. Caiazzo, MD, [facility], as to whether  Coumadin would be appropriate in this gentleman.  We will see him again  in six months' time to continue to follow his frequency of atrial  fibrillation.    Sincerely,      Duke Salvia, MD, [facility]  Electronically Signed    SCK/MedQ  DD: 06/10/2007  DT: 06/10/2007  Job #: [id number]

## 2010-10-16 NOTE — Assessment & Plan Note (Signed)
[facility]                            CARDIOLOGY OFFICE NOTE   NAME:Edward Harvey, Edward Harvey                      MRN:          [id number]  DATE:07/11/2008                            DOB:          June 11, 1915    Mr. Domangue comes in today for followup.   He has been a long-time patient of mine who I have not seen in a while.  I do not have his chart today but do have some E-chart notes.   He has a history of complete heart block, status post pacer x2.  He  presented with syncope, years ago.   He also has a history of hypertension.   He is currently 77 years of age.  His wife is with him and she is 43.  He is extremely bright cerebrally and he is also very active.   He denies any orthopnea, PND, or peripheral edema.  He has had no  presyncope or syncope.  He has had no angina.   Recent blood work was remarkable and that he has a profoundly good lipid  panel except for low HDL.  He says he is very strict with his diet.  His  comprehensive metabolic panel, TSH, and BNP was all normal.  CBC was  also normal.   MEDICATIONS:  1. Eyedrops.  2. Coreg 6.25 two b.i.d.  3. Quinapril 20 mg p.o. b.i.d.  4. Furosemide 20 mg per day.  5. Allopurinol 300 mg per day.  6. Multivitamin daily.   ALLERGIES:  He has no known drug allergies.   PHYSICAL EXAMINATION:  GENERAL:  He looks remarkably younger than stated  age.  He is alert and oriented x3.  VITAL SIGNS:  His blood pressure is 128/80, his pulse 64 and regular,  and his weight is 230.  HEENT:  He has some muddy sclerae, otherwise normal.  Facial symmetry is  normal.  Dentition is satisfactory.  NECK:  Supple.  Carotid upstrokes are equal bilaterally without bruits.  No JVD.  Thyroid is not enlarged.  Trachea is midline.  LUNGS:  Clear to  auscultation and percussion.  HEART:  Soft S1 and S2.  PMI was difficult to appreciate.  Pacemaker  site is stable.  ABDOMEN:  Soft and good bowel sounds.  No midline  bruits.  EXTREMITIES:  No cyanosis, clubbing, or edema.  Pulses are intact.  NEUROLOGIC:  Intact.   His EKG, the AV paced at 64.   ASSESSMENT AND PLAN:  Mr. Beazer is doing remarkably well.  I have made no  changes in his medical program.  His blood pressure is under good  control.  I will plan on seeing him back again in 6 months.     Thomas C. Daleen Squibb, MD, [facility]  Electronically Signed    TCW/MedQ  DD: 07/11/2008  DT: 07/12/2008  Job #: [id number]

## 2010-10-19 NOTE — H&P (Signed)
NAME:  FREEMAN, BORBA                         ACCOUNT NO.:  [id number]   MEDICAL RECORD NO.:  [id number]                   PATIENT TYPE:  EMS   LOCATION:  ED                                   FACILITY:  [facility]   PHYSICIAN:  Wilhemina Bonito. Marina Goodell, M.D. [facility]             DATE OF BIRTH:  1915/07/30   DATE OF ADMISSION:  07/01/2002  DATE OF DISCHARGE:                                HISTORY & PHYSICAL   CHIEF COMPLAINT:  Rectal bleeding.   HISTORY:  Mr. Purk is a very nice 77 year old African-American male, known  to [place]. Kriste Basque, M.D., Vania Rea. Jarold Motto, M.D., and Jesse Sans. Michelin, M.D.  He has a history of recurrent diverticular bleeding and actually had a major  bleed in June 2003 and underwent subtotal colectomy with ileal-to-sigmoid  colon anastomosis per Sandria Bales. Ezzard Standing, M.D.  He also has a history of  congestive heart failure, pacemaker placement in 1994 for a third-degree  heart block, history of gout, history of prostate cancer, status post  radiation therapy in 1996, history of subdural hematoma requiring surgical  intervention per Payton Doughty, M.D. in 1995.  He has had cataract extractions  and is status post excision of a large mass on his left thigh which was  benign.   The patient apparently had some low-grade bleeding from his rectum in August  2003 and was evaluated as an outpatient by Barbette Hair. Arlyce Dice, M.D.  He  underwent upper endoscopy on 01/15/02, which was negative, and then  colonoscopy the same day, which was a negative exam.  The scope was passed  60 cm into the ileum and was felt to be negative.   The patient has not had any further bleeding since that time.  Now with  onset this morning after waking up with one dark, bloody bowel movement.  He  had no associated abdominal pain or cramping, nausea or vomiting,  diaphoresis, etc.  He called the office and was advised to come to the  emergency room for evaluation.  He has had no further stools and is  hemodynamically  stable on arrival.  Abdomen is benign.  Rectal exam with  burgundy stool, 4+ positive.   LABORATORY STUDIES:  WBC 9, hemoglobin 13.6, hematocrit 42, MCV 99.  The  remainder of labs pending.   MEDICATIONS:  1. Ultram 50 t.i.d.  2. Allopurinol 300 daily.  3. Baby aspirin daily.  4. Lasix 20 daily.  5. Centrum daily.  6. Coreg 6.25 b.i.d.  7. Accupril 20 daily.   ALLERGIES:  No known drug allergies.   PAST HISTORY:  As outlined above.   FAMILY HISTORY:  Mother deceased with cervical cancer.  Brother deceased  with cancer, type unclear.   SOCIAL HISTORY:  The patient is a retired Engineer, production and lives with  his wife in [place].  No ETOH or tobacco.   REVIEW OF SYSTEMS:  CARDIOVASCULAR:  Negative.  PULMONARY:  Negative for  cough, shortness of breath, sputum production.  GENITOURINARY:  Negative.  MUSCULOSKELETAL:  Pertinent for arthritic symptoms in his back and his  knees.  He feels that the Ultram does not work as well as Celebrex which he  was taken off of because of the bleeding.   PHYSICAL EXAMINATION:  GENERAL:  A well-developed, elderly, African-American  male, in no acute distress.  VITAL SIGNS:  Temp. 97.8, blood pressure 155/88, pulse 70's, saturations 97  on room air.  HEENT:  Normocephalic, atraumatic.  EOMI, PERRLA.  Sclerae anicteric.  HEART:  Regular rate and rhythm with S1, S2.  Pacer in place.  PULMONARY:  Clear.  ABDOMEN:  Large, soft, nontender.  No mass or hepatosplenomegaly.  Midline  scars.  Bowel sounds active.  RECTAL:  Small external hemorrhoids.  [place], dark stool, 4+ positive.  EXTREMITIES:  Trace edema right ankle.  Large surgical scar in the left  thigh.  NEUROLOGIC:  Grossly nonfocal.   IMPRESSION:  1. An 77 year old male with rectal bleeding, suspect lower or small bowel     source, status post subtotal colectomy with ileal-to-sigmoid anastomosis     in July 2003, for diverticular hemorrhage.  2. History of congestive heart  failure.  3. History of heart block, status post pacemaker.  4. Gout.  5. Arthritis.  6. Cataracts, status post extraction.  7. History of subdural hematoma.  8. History of prostate cancer, status post radiation therapy in 1996.   PLAN:  1. The patient is admitted to the service of Wilhemina Bonito. Marina Goodell, M.D.  2. Serial H&H's.  3. Transfusion as indicated.  4. Consider repeat colonoscopy and if this negative, may need small-bowel     follow-through.  5. For details, please see the orders.     Amy Esterwood, P.A.-C. [facility]                John N. Marina Goodell, M.D. [facility]    AE/MEDQ  D:  07/01/2002  T:  07/01/2002  Job:  [id number]

## 2010-10-19 NOTE — Discharge Summary (Signed)
NAME:  Edward Harvey, Edward Harvey                         ACCOUNT NO.:  [id number]   MEDICAL RECORD NO.:  [id number]                   PATIENT TYPE:   LOCATION:                                       FACILITY:  [facility]   PHYSICIAN:  Sandria Bales. Ezzard Standing, M.D.               DATE OF BIRTH:  1916/02/28   DATE OF ADMISSION:  10/29/2001  DATE OF DISCHARGE:  11/14/2001                                 DISCHARGE SUMMARY   DISCHARGE DIAGNOSES:  1. Lower gastrointestinal bleed secondary to diverticular disease.  2. History of congestive heart failure.  3. History of pacemaker with ______ followed by [facility]; primary     doctor is Juanito Doom and Dr. Berton Mount.  4. History or prostate cancer treated with radiation therapy per Drs. Ballard Russell and Ihor Gully.  5. History of subdural hematoma requiring evacuation in 1995 by Dr. Trey Sailors.  6. Gout.  7. Glaucoma.   OPERATIONS PERFORMED:  The patient had a subtotal colectomy with ileal to  distal sigmoid colon anastomosis on November 07, 2001 and then he had a  colonoscopy on November 06, 2001 by Dr. Arlyce Dice.   HISTORY OF ILLNESS:  The patient is an 77 year old black male, who is a  patient of Dr. [name], who has had left-sided colonic diverticulosis  noted on prior colonoscopies.  The most recent, prior to this admission, was  in August 2002 by Dr. Sheryn Bison.   He was admitted on Oct 29, 2001 after having a lower GI bleed and bright red  blood noticed per rectum.  He had been transfused approximately 6 units of  blood; his most recent hemoglobin was 8.  The patient had some orthostatic  changes in his blood pressure when he first presented to the hospital but  since that time has been stable.   Prior to admission, he was taking a baby aspirin and a Celebrex a day.  He  has denied any prior liver disease, pancreatic disease, he has had an ulcer  disease some 20 years ago.   PAST MEDICAL HISTORY:  1. He had a subdural hematoma requiring  evacuation by Dr. Trey Sailors in 1995.     He has had no residual neurologic problems from that.  2. He has been followed by Dr. Juanito Doom and Dr. Berton Mount for congestive     heart failure, has a pacemaker, had a Cardiolite scan in 2000 which     showed a normal ejection fraction.  3. He was treated by Ballard Russell and Ihor Gully for prostate cancer in     which he underwent radiation therapy but he has done well from that.   PHYSICAL EXAMINATION:  VITAL SIGNS:  Blood pressure 128/84, pulse 70.  GENERAL:  He was a well-nourished large black male alert and cooperative on  physical exam.  ABDOMEN:  His  abdomen was soft.  He had no scars, no mass, no tenderness.   HOSPITAL COURSE:  He was seen by Dr. Claudette Head originally and then Dr.  Arlyce Dice from a GI standpoint.  Dr. Chales Abrahams saw him for [facility].  A  discussion was carried out with the patient and his family that he had a  significant blood loss from his lower GI bleed, had had 6 units transfused  with a hemoglobin about 8 and he was felt to be best served by proceeding  with a subtotal colectomy.   On November 07, 2001, the patient was taken to the operating room where he  underwent a subtotal colectomy with ileal to distal sigmoid colon  anastomosis.  Postoperatively, he did reasonably well.  His first day postop  his hemoglobin was 11.2, hematocrit 32.9 and white blood count of 15,700  with a potassium of 4.3.  He was in the intensive care unit for 2 days and  then transferred out.  He was mildly confused  on the third postoperative  day and pulled his NG tube out three times.  His labs have remained stable.   By the fifth postoperative day (November 12, 2001), his confusion was clearing,  we have restarted his allopurinol orally, he was having several loose bowel  movements and we advanced his diet.   He did have a superficial wound hematoma I had to drain on the sixth  postoperative day which was November 13, 2001 but this rapidly  cleaned up and by  November 14, 2001, which was his eighth postoperative day, he was ready for  discharge.   DISCHARGE INSTRUCTIONS:  Discharge instructions included resuming his home  medications taking a multivitamin over the counter, he was to take Vicodin  for pain, he was to clean his wound two to three times a day, he was to see  me back in the office in about 1 week and check with Dr. Kriste Basque in 2-4 weeks  after discharge.   FINAL PATHOLOGY:  His final pathology showed a colon with diverticulosis,  associated intraluminal hemorrhage, a benign appendix and 11 benign lymph  nodes; there was no evidence of malignancy.   DISCHARGE CONDITION:  Good.                                               Sandria Bales. Ezzard Standing, M.D.    DHN/MEDQ  D:  05/25/2002  T:  05/26/2002  Job:  [id number]   cc:   Venita Lick. Pleas Koch., M.D. [facility]  [address]  [place]  [place] [address]  Fax: 1   Vania Rea. Jarold Motto, M.D. [facility]  [address]  [place]  [place] [address]  Fax: 1   Lonzo Cloud. Kriste Basque, M.D. [facility]   Duke Salvia, M.D. [facility]   Maisie Fus C. Montoya, M.D. [facility]  [address]  [place]  [place] [address]  Fax: 1   Mark C. Vernie Ammons, M.D.  [address]  [place]  [place] [address]  Fax: [phone]   Maryln Gottron, M.D.  [address] [facility]  [place]  [place]  [address]  Fax: [phone]

## 2010-10-19 NOTE — H&P (Signed)
NAMEPAGE, LANCON               ACCOUNT NO.:  [id number]   MEDICAL RECORD NO.:  [id number]          PATIENT TYPE:  OIB   LOCATION:  2899                         FACILITY:  [facility]   PHYSICIAN:  Duke Salvia, M.D.  DATE OF BIRTH:  27-May-1916   DATE OF ADMISSION:  11/23/2004  DATE OF DISCHARGE:  11/23/2004                                HISTORY & PHYSICAL   PRIMARY CARE PHYSICIAN:  Lonzo Cloud. Kriste Basque, M.D. [facility].   CARDIOLOGIST:  Jesse Sans. Daleen Squibb, M.D.   ELECTROPHYSIOLOGIST:  Duke Salvia, M.D.   GASTROENTEROLOGIST:  Vania Rea. Jarold Motto, M.D. [facility]   PHYSICAL EXAMINATION:  VITAL SIGNS:  Temperature of 98.8, blood pressure  178.87, pulse is 61, respirations are 18, oxygen saturation 95% on room air,  weight 252.  GENERAL:  The patient is alert and oriented x 3, no acute distress.  LUNGS:  Clear to auscultation and percussion bilaterally.  HEART:  Regular rate and rhythm.  Grade 2/6 murmur.  ABDOMEN:  Soft, nondistended.  Bowel sounds are present.  EXTREMITIES:  Show no evidence of edema.  Right dorsalis pedis 4/4.  Left  dorsalis pedis is 2/4.  Radial pulses are 4/4 bilaterally.  NEUROLOGIC:  No focal neurologic deficits.   IMPRESSION:  1.  Admitted for pacemaker generator change.  2.  History of syncope/complete heart block, status post permanent      pacemaker, a Paragon II St. Jude pacemaker, now at elective replacement      indicator.  3.  Cardiac tamponade, status post pacemaker implant, required      pericardiocentesis.  4.  Echocardiogram, September 20, 2003:  Left ventricular function normal,      trivial mitral regurgitation.  5.  History of prostate cancer, status post x-ray therapy.  6.  Diverticulosis.  7.  History of gastrointestinal bleed, June 2003, status post subtotal      colectomy with __________ to distal colonic anastomosis.  8.  Bilateral cataract surgery.  9.  Class I congestive heart failure symptoms.  10. Hypertension.  The patient is currently hypertensive  on admission.   MEDICATIONS:  1.  Cosopt one drop both eyes twice daily.  2.  Coreg 6.25 mg twice daily.  3.  Furosemide 20 mg daily.  4.  Quinapril 20 mg twice daily.  5.  Allopurinol 300 mg daily.  6.  Multivitamin daily.  7.  Xalatan one drop both eyes at bedtime.   PLAN:  Today for pacemaker generator change.   LABORATORY:  On November 23, 2004:  Complete blood count:  White cells are 8.7,  hemoglobin 14.9, hematocrit 44.6, platelets 211.  PT is 13.3, INR 1.0, PTT  is 31.  Serum electrolytes:  Sodium 140, potassium 5, chloride 106,  carbonate 29, glucose 114, BUN is 11, creatinine 1.4.      Debbora Lacrosse   GM/MEDQ  D:  11/23/2004  T:  11/23/2004  Job:  [id number]

## 2010-10-19 NOTE — Op Note (Signed)
[facility]  Patient:    Edward Harvey, Edward Harvey Visit Number: [id number] MRN: [id number]          Service Type: MED Location: [phone] Attending Physician:  Starr Sinclair Dictated by:   Sandria Bales Ezzard Standing, M.D. Proc. Date: 11/07/01 Admit Date:  10/29/2001 Discharge Date: 11/09/2001   CC:         Molly Maduro D. Arlyce Dice, M.D. [facility]  Vania Rea. Jarold Motto, M.D. [facility]  Scott M. Kriste Basque, M.D. [facility]  Maisie Fus C. Scheurich, M.D. [facility] C. Vernie Ammons, M.D.   Operative Report  DATE OF BIRTH:  1915-08-26.  PREOPERATIVE DIAGNOSIS:  Colonic diverticulosis with lower gastrointestinal bleed.  POSTOPERATIVE DIAGNOSIS:  Pancolonic diverticulosis with lower gastrointestinal bleed presumably secondary to diverticulosis.  PROCEDURE:  Subtotal colectomy with ileo- to distal colonic anastomosis.  SURGEON:  Sandria Bales. Ezzard Standing, M.D.  ASSISTANT:  Abigail Miyamoto, M.D.  ANESTHESIA:  General endotracheal.  ESTIMATED BLOOD LOSS:  400 cc.  DRAINS:  None.  INDICATION FOR PROCEDURE:  Mr. Alsip is a pleasant 77 year old black gentleman who has had known diverticulosis and was admitted to [facility] Oct 29, 2001, and has had at least six units of blood transfused to get to a hemoglobin of only about 9.2.  He has continued to have GI bleeding.  Dr. Arlyce Dice did a colonoscopy yesterday, seeing bright red blood at the transverse and left colon and though no single source was found, the patient has a pancolonic diverticulosis and feel he is a candidate for subtotal colectomy.  DESCRIPTION OF PROCEDURE:  The patient had been given a general endotracheal anesthetic as supervised by Bedelia Person, M.D.  He had an NG tube placed, Foley catheter in place, PAS stockings in place.  Again his preoperative hemoglobin was about 9.2.  He was transfused two units of blood during the procedure. His abdomen was shaved, prepped with Betadine solution, and sterilely draped. A long midline  incision was made with sharp dissection carried down into the abdominal cavity.  Abdominal exploration carried out revealing both of the lobes of the liver unremarkable.  The gallbladder had a robins egg blue and was unremarkable.  The stomach was unremarkable without mass, without any evidence of inflammation or ulcer.  The spleen was unremarkable.  The retroperitoneum was unremarkable.  The small bowel was run from the ligament of Treitz to the terminal ileum.  There was no Meckels, no other abnormality of the small bowel.  The colon itself, however, was blue-tinged consistent with containing quite a bit of blood.  There were diverticula noted in the right colon, transverse colon, left colon, and sigmoid colon.  I then started mobilizing the colon and started first from the left side and mobilizing the sigmoid colon, the splenic flexure.  I tried to save the omentum by taking the omentum off the transverse colon and then mobilizing the right colon.  I then divided the colon proximal to the ileocecal valve through the ileum and then took the mesentery of the bowel using Kelly clamps and 2-0 silk ties.  We were well up on the mesentery, did not try to identify either ureter, but again our dissection really carried [place] within 3 or 4 cm of the large bowel during the dissection, never really got to where the retroperitoneum or the ureters lay. The patient maintained an adequate urine output during the whole procedure.  I took the right colon down, mobilizing the hepatic flexure, took the transverse colon down, again  using Kelly clamps and 2-0 silk ties and suture ligatures, took the left colon down and the sigmoid colon down, and sent the entire specimen for pathologic evaluation.  There was no gross evidence of any malignancy or tumor within the colon and, again, it looked like he just had a pancolonic diverticulosis.  We then carried out an end-to-end anastomosis between the terminal ileum  and distal sigmoid colon.  We were maybe 10 cm above the peritoneal reflection, leaving an approximate 25-30 cm of colon behind.  There were no obvious diverticula below where the anastomosis was carried out.  The anastomosis was carried out in an end-to-end anastomosis using interrupted 3-0 silk sutures in inverting fashion.  There were two kind of gaps in the anastomosis, but these were oversewn and I could produce no leakage or probe any gap after the bowel had been closed.  I closed the mesentery with 2-0 silk sutures.  I then irrigated the abdomen out with about 5 of 6 L of saline.  I closed the abdomen the first time; however, the anesthesia _____ the NG tube.  I finally got the NG tube in position.  I noticed there was some bleeding in the upper abdomen, so I reopened, took the sutures down, found two bleeders off the mesentery that we had saved and tied these.  There was no further bleeding that was noted.  The spleen again was looked at, as was the mesentery of the entire large bowel was again evaluated, and again there was no bleeding.  We then reclosed the abdomen using two running #1 PDS sutures with some interrupted #1 Novofil sutures.  The subcutaneous tissue was irrigated and the skin closed with a skin gun.  Dr. Gypsy Balsam was placing a central line at the end of the procedure. The patient tolerated the procedure well.  Estimated blood loss was about 400 cc, and the patient was given two units of blood during the procedure, starting out with a hemoglobin of about 9.2.  He will be kept in the ICU for a couple of days of observation.  cc:  Veneda Melter, M.D. Dictated by:   Sandria Bales. Ezzard Standing, M.D. Attending Physician:  Starr Sinclair DD:  11/07/01  TD:  11/10/01 Job: [id number]

## 2010-10-19 NOTE — Discharge Summary (Signed)
NAME:  Edward Harvey, Edward Harvey                         ACCOUNT NO.:  [id number]   MEDICAL RECORD NO.:  [id number]                   PATIENT TYPE:   LOCATION:                                       FACILITY:   PHYSICIAN:  Sharlet Salina T. Hoxworth, M.D.          DATE OF BIRTH:   DATE OF ADMISSION:  12/29/2001  DATE OF DISCHARGE:  01/05/2002                                 DISCHARGE SUMMARY   DISCHARGE DIAGNOSIS:  Small bowel obstruction.   OPERATIONS AND PROCEDURES:  None.   HISTORY OF PRESENT ILLNESS:  The patient is an 77 year old black male status  post subtotal colectomy and ileoproctostomy by Dr. Ovidio Kin on  11/07/2001 for lower GI bleeding and pan diverticulosis.  He did very well  until the day prior to admission when he developed cramping mid abdominal  pain that assisted associated with nausea and vomiting.  His last bowel  movement was yesterday.   PAST MEDICAL HISTORY:  He had a pacemaker for AV block.  He has had a  history of congestive heart failure, gout and hypertension.  He received  radiation treatment for prostate cancer in 1996.   PAST SURGICAL HISTORY:  Colectomy as above.  Burr hole for subdural hematoma  in 1995 and cataract surgery.  Also treated for glaucoma.   MEDICATIONS:  Allopurinol 300 mg daily, aspirin 81 mg daily, Coreg 6.245 mg  a day, Lasix 20 mg a day, Accupril 20 mg b.i.d., Nasonex spray daily,  Celebrex 200 mg a day, and eye drops of unknown type for glaucoma.   SOCIAL HISTORY/FAMILY HISTORY/REVIEW OF SYSTEMS:  See detailed H&P.   PHYSICAL EXAMINATION:  VITAL SIGNS:  Temperature 99.2, vital signs all  within normal limits.  GENERAL:  He is an elderly overweight black male in no acute distress.  ABDOMEN:  Some questionable mild distention with decreased high pitched  bowel sounds.  There was mild diffuse tenderness.   LABORATORY DATA:  Essentially unremarkable.  Flat and upright abdominal x-  rays showed small bowel obstruction.   HOSPITAL  COURSE:  The patient was admitted, treated NG suction and IV  fluids.  He gradually felt better.  He had had a bowel movement by the  second postoperative day and the NG tube was clamped.  He did have some  occasional pain and nausea with this and the NG was intermittently unclamped  over the next 48 hours.  He felt worse on 07/31 with repeat vomiting and his  abdomen was slightly more distended.  His x-ray actually looked worse.  Plans were made for possible CT scan but by 08/01 he was feeling  significantly better and had had two large bowel movements.  On 08/02  continued to feel well with minimal NG output and benign abdomen.  Continued  to have bowel movements and was gradually advanced to a regular diet by  08/03.  He tolerated this well with no further difficulty and was  discharged  on 01/05/02 in good condition.    DISCHARGE MEDICATIONS:  Same as on admission.   FOLLOWUP:  In the office with Dr. Ezzard Standing in two weeks.                                               Lorne Skeens. Hoxworth, M.D.    Tory Emerald  D:  02/05/2002  T:  02/08/2002  Job:  [id number]

## 2010-10-19 NOTE — Discharge Summary (Signed)
NAMEGLENWOOD, REVOIR               ACCOUNT NO.:  [id number]   MEDICAL RECORD NO.:  [id number]          PATIENT TYPE:  OIB   LOCATION:  2899                         FACILITY:  [facility]   PHYSICIAN:  Duke Salvia, M.D.  DATE OF BIRTH:  10/04/1915   DATE OF ADMISSION:  11/23/2004  DATE OF DISCHARGE:  11/23/2004                                 DISCHARGE SUMMARY   DISCHARGE DIAGNOSES:  1.  Discharging after explantation of existing Paragon II St. Jude permanent      pacemaker and insertion of VICTORY XLDR DDDR, model [id number] by St. Jude.  2.  Existing permanent pacemaker at elective replacement indicator.  3.  History of syncope/complete heart block status post St. Jude Paragon II      pacemaker implantation.  4.  Cardiac tamponade after pacemaker implant requiring pericardiocentesis.   SECONDARY DIAGNOSES:  1.  Echocardiogram September 20, 2003.  Left ventricular function normal.  2.  History of prostate cancer/x-ray therapy.  3.  Diverticulosis.  4.  History of GI bleed in June 2003.  The patient is status post subtotal      colectomy with ileal to distal colonic anastomosis at that time.  5.  Bilateral cataract surgery.  6.  Hypertension.  7.  Gout.  8.  Glaucoma.   PROCEDURE:  November 23, 2004.  Explantation of existing permanent pacemaker  which was at elective replacement indicator and implantation without  complications of VICTORY DDDR dual-chamber pacemaker by Dr. Sherryl Manges.   DISCHARGE DISPOSITION:  Mr. Otten is discharging the same day as implantation  of new pacemaker generator.  He has had no febrile complications post  procedurally.  His incision is healing nicely without swelling, erythema, or  drainage.  His pain is well controlled with oral analgesia.  He is  maintaining sinus rhythm.  He will discharge on the following medications:  1.  Cosopt opthalmic solution, one drop both eyes twice daily.  2.  Coreg 6.25 mg twice daily.  3.  Furosemide 20 mg daily.  4.  Quinalapril  20 mg twice daily.  5.  Allopurinol 300 mg daily.  6.  Multivitamin daily.  7.  Xalatan ophthalmic solution one drop both eyes at bedtime.  8.  New medication:  Keflex 500 mg one tab four times daily, recommended      breakfast, lunch, dinner, and bedtime.   The patient is to remove his bandage in the morning Saturday, June 24.  He  is to keep the incision dry for the next seven days, sponge bathe until  Friday June 30.   DISCHARGE DIET:  Low sodium, low cholesterol diet.   He is to keep his left arm quiet by his side for the next two days, avoid  heavy lifting for the next two weeks.  He is to avoid driving for the next  five days, and to resume Wednesday, June 28.  He will follow up at the  pacemaker clinic in two weeks.  This appointment will be given to the  patient before discharge.   BRIEF HISTORY:  Mr. Giampietro is an 77 year old male.  He has a pacemaker  previously implanted in 1994 for complete heart block associated with  recurrent syncope.  The procedure was complicated by tamponade requiring  pericardial centesis.  Since that time, he has done well.  His device  is  now at elective replacement indicator and he needs a generator replaced.  Ejection fraction was normal on April 2005 by Cardiolite study.  The patient  gives no known history of coronary artery disease or myocardial infarction.  The patient will be admitted electively for pacemaker generator change.   HOSPITAL COURSE:  The patient admitted on June 23.  The procedure was done  was explantation of pacemaker with implantation of new pacemaker generator  by Dr. Sherryl Manges the same day.  The patient will discharge on June 23 as  well.  Once he again, he goes home on additional medication of Keflex for  five days.      Debbora Lacrosse   GM/MEDQ  D:  11/23/2004  T:  11/24/2004  Job:  [id number]   cc:   Lonzo Cloud. Kriste Basque, M.D. [facility]   Duke Salvia, M.D.   Thomas C. Ruffolo, M.D.

## 2010-10-19 NOTE — Op Note (Signed)
Edward Harvey, Edward Harvey               ACCOUNT NO.:  [id number]   MEDICAL RECORD NO.:  [id number]          PATIENT TYPE:  OIB   LOCATION:  2899                         FACILITY:  [facility]   PHYSICIAN:  Duke Salvia, M.D.  DATE OF BIRTH:  [birth year]   DATE OF PROCEDURE:  11/23/2004  DATE OF DISCHARGE:  11/23/2004                                 OPERATIVE REPORT   PREOPERATIVE DIAGNOSIS:  Previously implanted pacemaker for complete heart  block, now at end of life.   POSTOPERATIVE DIAGNOSIS:  Previously implanted pacemaker for complete heart  block, now at end of life.   PROCEDURE:  Explantation of a previously implanted device.   Following obtaining informed consent, the patient was brought to the  electrophysiology laboratory and placed on the fluoroscopic table in the  supine position.  After routine prep and drape, lidocaine was infiltrated  along the line of the previous right subclavicular incision and was carried  down to the layer of the pacemaker pocket using sharp dissection.  The  pocket was opened up.  Care was taken to make sure the leads were protected,  and the device was explanted.   At this point, it was noted that there was blood in the ventricular lead  insertion port.  The atrial lead was removed first.  This was a previously  implanted XY48, serial no. [id number], with a P wave of 4, with impedance of  451, a threshold of 1.5 volts at 0.5 msec, the current threshold was 4.6 MA.   The ventricular lead was then freed up, and this was a 1216T, 58 cm length  lead, serial no. [id number].  There was no intrinsic ventricular rhythm.  The  impedance was 780 ohms.  The threshold was 1 volt at 0.5 msec, the current  threshold was 1.6 MA.   With these acceptable parameters recorded, the lead was secured to a St.  Jude Victory XL DR pulse generator, serial no. [id number], model no. 5816.  __________ pacing was identified.  The pocket was washed copiously irrigated  with  antibiotic-containing saline solution.  Hemostasis was ensure, and the  leads and the pulse generator were placed in the pocket and secured to the  prepectoral fascia.  The wound was closed in three layers in the normal  fashion.  The wound was washed and dried, and a Benzoin and Steri-Strip  dressing was applied.  Needle counts, sponge counts, and instrument counts  were correct at the end of the procedure, according to the staff.  The  patient tolerated the procedure without apparent complication.       SCK/MEDQ  D:  11/23/2004  T:  11/24/2004  Job:  [id number]   cc:   Electrophysiology Laboratory   [facility]

## 2010-10-19 NOTE — Consult Note (Signed)
[place]. [facility]  Patient:    Edward Harvey, Edward Harvey Visit Number: [id number] MRN: [id number]          Service Type: MED Location: [phone] Attending Physician:  Starr Sinclair Dictated by:   Sandria Bales. Ezzard Standing, M.D. Proc. Date: 11/06/01 Admit Date:  10/29/2001   CC:         Edward Harvey. Edward Harvey, M.D. [facility]  Edward Harvey. Edward Harvey, M.D. [facility]  Edward Harvey, M.D. [facility]  Edward Harvey, M.D., [facility]  Edward Harvey, M.D. [facility]   Consultation Report  DATE OF BIRTH:  May 14, 1916  HISTORY OF PRESENT ILLNESS:  [name] is a pleasant 77 year old black male who is a patient of Dr. Alroy Harvey, who has had known left-sided colonic diverticulosis from prior colonoscopies, most recently in August 2002 by Dr. Sheryn Harvey.  He was admitted this time on 29 Harvey 2003 after having a lower GI bleed.  He has now been transfused approximately 6 units of blood, and his most recent hemoglobin is 8.  The patient had some orthostatics when he first presented, but since then he has actually been fairly stable from a dizziness and blood pressure standpoint.  He was taking a baby aspirin and a Celebrex daily prior to admission.  He has had no prior history of liver problems.  He did say in the remote past, maybe 20+ years ago, he Harvey have had some ulcer disease.  He has had no history of pancreatic disease.  Again, he had the prior colonoscopy in August 2002 which, other than the diverticular disease, was unremarkable.  He has had no prior abdominal surgery.  PAST MEDICAL HISTORY:  ALLERGIES:  No known allergies.  MEDICATIONS ON ADMISSION: 1. Allopurinol 300 mg daily. 2. Aspirin 81 mg daily. 3. Coreg 6.25 mg twice a day. 4. Lasix 20 mg daily. 5. Accupril 20 mg twice a day. 6. Celebrex 200 mg daily. 7. Supplemental Vitamin E, C, Os-Cal. 8. Cosopt eye drops for glaucoma.  REVIEW OF SYSTEMS:  Neurologic: Apparently he suffered a subdural hematoma which  required evacuation in about 1995 by Dr. Trey Harvey, but he has had no chronic neurologic problems since that time.  Pulmonary: He has no history of pneumonia or tuberculosis.  He does not smoke cigarettes.  Cardiac: He has been followed by Dr. Daleen Harvey and Dr. Berton Harvey, and apparently he has been diagnosed in the past with congestive heart failure, but at least according to records, a Cardiolite scan in March 2000 showed normal ejection fraction and no ischemia.  He does have a pacemaker in place which has been in place for about nine years.  Gastrointestinal: See History of Present Illness. Urologic: He had prostate cancer and underwent radiation therapy by Dr. Ballard Harvey in 1996 and is followed by Edward Harvey with no evidence of recurrent disease at this time.   He does have gout for which he is treated. Endocrine.  His wife and two sons are in the room during exam.  PHYSICAL EXAMINATION:  VITAL SIGNS:  Blood pressure 128/84, pulse 70 and regular.  He is afebrile.  GENERAL: Well-nourished black male.  HEENT:  Unremarkable.  NECK:  Supple.  I feel no mass, no thyromegaly.  LUNGS:  Clear to auscultation.  HEART:  Regular rate and rhythm.  He has a pacemaker in the right upper chest.  ABDOMEN:  Soft.  He has no scar, no mass, no organomegaly, no tenderness.  RECTAL:  I did not do a rectal exam on his today because of his colonoscopy done by Dr. Arlyce Harvey today.  EXTREMITIES:  Good strength in all four extremities.  NEUROLOGIC:  Grossly intact.  DIAGNOSTIC DATA:  Besides his most recent hemoglobin of 8, his last labs I see as far as a sodium is 142, potassium 4.0, chloride 106, CO2 29, glucose 131. Liver functions are normal.  His PT was 13.9, PTT 24.  ASSESSMENT: 1. Dr. Arlyce Harvey did a colonoscopy on him today and saw bright red blood in his    transverse colon and left colon with multiple diverticula.  No clear source    of bleeding was apparent, but he was felt to have a  lower gastrointestinal    bleed.  The patient has continued lower gastrointestinal bleed, decreasing    hemoglobin, transfused 6 units of blood.  Feel he will be best served with    a subtotal colectomy.  I discussed this with the patient and his family.    Certainly at his age, he is at significant risk for any surgery, but with    continued bleeding, he is also at significant risk of having problems.  We    will try to schedule this for tomorrow at the earliest convenient time. 2. Congestive heart failure.  He has a pacemaker in place.  I spoke with    Dr. Chales Harvey who will see him in consultation for me, though he seems like he    is pretty stable from a cardiac standpoint. 3. History of prostate cancer without any disease at this time. 4. Remote history of subdural, no chronic neurologic complaint. 5. Gout. Dictated by:   Sandria Bales. Ezzard Standing, M.D. Attending Physician:  Starr Sinclair DD:  11/06/01 TD:  11/06/01 Job: [id number]

## 2010-10-19 NOTE — H&P (Signed)
NAMESLADE, PIERPOINT               ACCOUNT NO.:  [id number]   MEDICAL RECORD NO.:  [id number]          PATIENT TYPE:  INP   LOCATION:  1828                         FACILITY:  [facility]   PHYSICIAN:  Edward Harvey, Edward Harvey OF BIRTH:  1916/01/05   DATE OF ADMISSION:  03/12/2005  DATE OF DISCHARGE:                                HISTORY & PHYSICAL   PRIMARY CARDIOLOGIST:  Edward Harvey, M.D.   REASON FOR ADMISSION:  Epigastric discomfort responsive to nitroglycerin.   HISTORY OF PRESENT ILLNESS:  [name] is an 77 year old male with a history  of complete heart block status post pacemaker placement, most recently with  a Paragon device followed by Dr. Graciela Harvey with generator replacement in June of  this year.  Original device was implanted in 1994, and was complicated by  cardiac tamponade requiring pericardiocentesis.  In terms of his  cardiovascular history, there is a description in the office notes of  potential coronary artery disease with previous myocardial infarction and  scar by Cardiolite scan in April of 2005, although the patient and family  deny any clear history of coronary artery disease or myocardial infarction,  and no other information is available to me at this time.   [name] presented to the emergency department this morning stating that he  awoke around midnight complaining of a fullness and tightness in his  epigastric area.  This persisted in a waxing and waning pattern throughout  the night.  He thought that it was probable due to gas.  He did not take any  specific medicines, and as the symptoms persisted this morning, he came to  the emergency department for further assessment.  He states that he was  given 2 sublingual nitroglycerin tablets and his symptoms resolved.  His  electrocardiogram shows a paced ventricular rhythm at 72 beats per minute,  and point of care cardiac markers are reassuring with a troponin I less than  0.05.  Lipase is 25.   Abdominal and pelvic CT scan was obtained by the  emergency department staff with formal report pending, but verbal report of  no acute abnormality.  Chest x-ray shows cardiomegaly with no frank edema,  but some vascular congestion.  [name] states that he has actually done  fairly well until recently, and that these are new symptoms.  He has not had  any exertional chest pain or progressive shortness of breath of late.  He  reports compliance with his medications, with the exception of aspirin.  On  my exam, he is comfortable, denying any active symptoms, and is  hemodynamically stable.   ALLERGIES:  No known drug allergies.   PRESENT MEDICATIONS:  1.  Coreg 6.25 mg p.o. b.i.d.  2.  Lasix 20 mg p.o. daily.  3.  Allopurinol 300 mg p.o. daily.  4.  Multivitamin one p.o. daily.  5.  Quinapril 20 mg p.o. b.i.d.   PAST MEDICAL HISTORY:  1.  History of complete heart block, status post pacemaker placement in      1994, complicated by cardiac tamponade requiring pericardiocentesis.  The patient is apparently recent status post generator change by Dr.      Graciela Harvey in June of this year.  2.  Reported history of left ventricular dysfunction with an ejection      fraction of 45%, although described as normal by a presumed outpatient      echocardiogram in April of last year; specifics not available to me at      this time.  3.  Possible history of coronary artery disease, although this history is a      bit vague and difficult to sort out.  There is a description of a      potential scar by Cardiolite in April of last year, although the patient      denies any clear history of coronary artery disease or myocardial      infarction, and denies cardiac catheterization.  4.  Hypertension.  5.  History of gastrointestinal hemorrhage.  6.  Status post subtotal colectomy with ileal distal colonic anastomosis in      June of 2003.  7.  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  8.  History of prostate cancer.   SOCIAL HISTORY:  The patient lives in [place].  He is a retired Database administrator.  He has 4 children.  He is a nonsmoker with no alcohol  use history.   FAMILY HISTORY:  Noncontributory at present.   REVIEW OF SYSTEMS:  As described in the history of present illness.  He has  had some arthritic complaints, predominantly involving his knees.  His  systems are otherwise negative.   PHYSICAL EXAMINATION:  VITAL SIGNS:  Temperature is 98 degrees, heart rate  70, respirations 18, blood pressure 151/85.  Oxygen saturation 95% on room  air.  GENERAL:  This is a normally-developed elderly male in no acute distress,  denying any chest pain or epigastric pain.  NECK:  No jugular venous pressure or carotid bruits.  No thyromegaly is  noted.  LUNGS:  Clear with __________ at rest.  CARDIAC:  A regular rate and rhythm without loud murmur or S3 gallop.  There  is no pericardial rub.  ABDOMEN:  Soft and nontender with normoactive bowel sounds.  There is no  guarding or rebound to palpation.  No hepatomegaly noted.  EXTREMITIES:  No clubbing, cyanosis, or edema.  Peripheral pulses are 2+.   LABORATORY DATA:  WBCs 11.0, hemoglobin 14.3, platelets 186.  Sodium 140,  potassium 4.2, BUN 15, creatinine 1.2, glucose 104, lipase 25, troponin I  less than 0.05, INR 0.9.   IMPRESSION:  1.  Recent onset of epigastric discomfort at rest, apparently relieved with      nitroglycerin, having waxed and waned all evening and into this morning.      Initial cardiac markers were negative.  Electrocardiogram showed a paced      ventricular rhythm.  The patient is symptom free at this time.  2.  Questionable history of coronary artery disease, presumably based on      scar noted at [facility] in August of 2005.  The patient denies any      history of coronary artery disease or myocardial infarction.   Limited     information in available otherwise.  3.  Apparent history of previously documented left ventricular dysfunction      with an ejection fraction of 45%, although apparently normal by repeat  echocardiography in April of 2005.  4.  History of complete heart block, status post pacemaker placement as      outlined above, with recent generator change in June of 2006.  5.  Hypertension.  6.  Prior history of gastrointestinal bleed with history of subtotal      colectomy and ileal distal colonic anastomosis in June of 2003.  The      patient is not on aspirin chronically.   PLAN:  1.  The patient will be admitted to telemetry.  He will be placed on      heparin, and cardiac markers will be cycled.  2.  Follow up on final abdominal and pelvic CT report.  3.  Cardiac markers are negative.  Can likely proceed with a non-invasive      ischemic assessment; otherwise, will need to consider cardiac      catheterization.  4.  Further plans to follow.           ______________________________  Edward Harvey, M.D. [facility]     SGM/MEDQ  D:  03/12/2005  T:  03/12/2005  Job:  [id number]   cc:   Thomas C. Upson, M.D.  [address]  [address]  [place]  [place] [address]   Duke Salvia, M.D.  [address]  [address]  [place]  [place] [address]

## 2010-10-19 NOTE — Assessment & Plan Note (Signed)
[facility]                         GASTROENTEROLOGY OFFICE NOTE   NAME:Mota, Edward Harvey                      MRN:          [id number]  DATE:07/21/2006                            DOB:          27-Nov-1915    Ainsley is completely asymptomatic at this time without abdominal or  back pain.  Because of his pain and guaiac positive stool, he underwent  colonoscopy on June 18, 2006 which showed severe erosive inflammation  of the small intestine above his ileocolonic anastomosis.  Biopsies were  nonspecific, but are felt consistent with ileitis and ulceration  probably from NSAIDs and aspirin.   I discontinued his aspirin therapy.  He is using p.r.n. tramadol for  pain.  He denies NSAID abuse otherwise.  He has been taking Colazal 750  mg three tablets twice a day for the last several weeks.  He is  completely asymptomatic at this time.  He is on a variety of other  medications per Dr. Kriste Basque, listed and reviewed in his chart.   PHYSICAL EXAMINATION:  GENERAL:  He is awake, alert, in no acute  distress.  VITAL SIGNS:  Weight 252 pounds, blood pressure 148/86, pulse of 74 and  regular.  ABDOMEN:  Generally unremarkable without masses or tenderness.   ASSESSMENT:  Mr. Haggart has marked gut sensitivity to salicylates and  NSAIDs.  He has had several episodes of small bowel hemorrhage, also  confirmed by colonoscopy in January of 2004 per Dr. Marina Goodell.  There is  certainly nothing in his history to suggest chronic inflammatory bowel  disease, but this remains a consideration.  I do not think his  symptomatology is consistent with ischemic bowel problems.   RECOMMENDATIONS:  1. Discontinue Colazal and will see how he does symptomatically.  2. Continue to avoid NSAIDs and salicylates.  3. Other medications and follow up per Dr. Kriste Basque as planned.     Vania Rea. Jarold Motto, MD, Caleen Essex, FAGA  Electronically Signed    DRP/MedQ  DD: 07/21/2006  DT:  07/21/2006  Job #: [id number]   cc:   Lonzo Cloud. Kriste Basque, MD
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
## 2010-10-19 NOTE — Assessment & Plan Note (Signed)
[facility]                         GASTROENTEROLOGY OFFICE NOTE   NAME:Edward Harvey, Edward Harvey                      MRN:          [id number]  DATE:06/13/2006                            DOB:          [birth year]    [name] has had recurrent lower abdominal pain and some rectal  bleeding.  He is status post sigmoid resection by Dr. Ovidio Kin in  December of 2003.  He had colonoscopy last by Dr. Marina Goodell in January of  2004 and had an ulcerated anastomosis at that time.  Endoscopy in August  of 2003 by Dr. Arlyce Dice was unremarkable.   Because of his crampy lower abdominal pain and some bright red blood he  was seen by Dr. Kriste Basque on June 05, 2006.  I do not have this note for  review.   LABORATORY DATA:  Showed a normal CBC and metabolic profile and TSH.   [name] chart is at [facility], we do not have it for review.   MEDICATIONS:  1. Furosemide, the dose of which is unclear.  2. Quinapril hydrochloride 20 mg two times a day.  3. Allopurinol 300 mg a day.  4. Tramadol 50 mg three times a day.  5. Coreg 6.25 mg twice a day.  6. A baby aspirin a day.  7. Centrum Silver daily.  8. Lasix 20 mg a day.   The patient is in no acute distress at this time.  He appears his stated  age.  His blood pressure is 140/90.  He weighed 253 pounds.  ABDOMINAL:  Entirely benign without organomegaly, masses, or tenderness.  RECTUM:  Inspection was unremarkable as was rectal exam.  Stool however  was +1 guaiac positive.   ASSESSMENT:  I am concerned about [name] guaiac positive stool and  his lower gastrointestinal complaint certainly suggesting that he has an  inflammatory or malignant process in his gut of some sorts.   RECOMMENDATIONS:  I have gone ahead and set Edward Harvey up for colonoscopy  as soon as possible at his convenience.  I have no thoughts about  therapy until we have completed this for further diagnostic clarity.  Continue his other medications  listed above.     Vania Rea. Jarold Motto, MD, Caleen Essex, FAGA  Electronically Signed    DRP/MedQ  DD: 06/13/2006  DT: 06/13/2006  Job #: [id number]   cc:   Lonzo Cloud. Kriste Basque, MD
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
## 2010-10-19 NOTE — Discharge Summary (Signed)
NAME:  GUISEPPE, FLANAGAN                         ACCOUNT NO.:  [id number]   MEDICAL RECORD NO.:  [id number]                   PATIENT TYPE:  INP   LOCATION:  0373                                 FACILITY:  [facility]   PHYSICIAN:  Wilhemina Bonito. Marina Goodell, M.D. [facility]             DATE OF BIRTH:  07-03-1915   DATE OF ADMISSION:  07/01/2002  DATE OF DISCHARGE:  07/02/2002                                 DISCHARGE SUMMARY   ADMITTING DIAGNOSES:  1. An 77 year old male with recurrent rectal bleeding, suspect lower or     small bowel source, status post subtotal colectomy with ileal to sigmoid     anastomosis in July of 2003 for major recurrent diverticular hemorrhage.     Rule out recurrent diverticular bleed, though doubt.  Rule out     anastomotic ulcerations.  Rule out proctitis in patient with prior     radiation therapy.  Rule out bleeding secondary to internal hemorrhoids.  2. History of congestive heart failure.  3. History of heart block status post pacemaker.  4. Gout.  5. Arthritis.  6. Cataracts status post extraction.  7. History of subdural hematoma.  8. History of prostate carcinoma status post radiation therapy in 1996.   DISCHARGE DIAGNOSES:  1. Stable status post low grade self limited lower gastrointestinal bleed     felt secondary to anastomotic ulceration, probably aspirin induced.  2. Stable hemoglobin with no significant drop this admission.  3. Other prior medical problems as outlined above.  4. Few remaining sigmoid diverticula noted on colonoscopy this admission.   CONSULTATIONS:  None.   PROCEDURE:  Colonoscopy per Wilhemina Bonito. Marina Goodell, M.D. [facility] on July 02, 2002.   LABORATORY STUDIES:  On January 29 WBC was 9, hemoglobin 13.6, hematocrit  42.0, MCV 99, platelets 206,000.  Serial values were obtained on the evening  of January 29.  Hemoglobin 13.3, hematocrit 39.0.  On the morning of January  30 hemoglobin 13.3, hematocrit 39.3.  Coags:  Pro time 13.2, INR 1, PTT 28.  Electrolytes within normal limits.  Glucose 112, BUN 10, creatinine 1.2,  albumin 3.3.  Liver function studies within normal limits.   HOSPITAL COURSE:  The patient was admitted to the service of Wilhemina Bonito. Marina Goodell,  M.D. [facility].  He was placed on telemetry, kept on a clear liquid diet.  Serial  H&Hs were obtained.  The patient had a very stable course and did not  manifest any further active bleeding through admission.  He was prepped for  colonoscopy on July 02, 2002.  He stated that his bowel movements had  cleared halfway through the prep.  He tolerated the procedure without  difficulty.  Was found to have multiple superficial anastomotic ulcerations  with some mucosal friability, but no worrisome stigmata.  Also noted several  sigmoid diverticula present status post subtotal colectomy.  There were a  few aphthoid appearing ulcers in the neo ileum  in the descending colon as  well.  It was felt that these changes as well as the anastomotic changes  were likely aspirin induced.   On the afternoon of July 02, 2002 he was allowed discharge to home in a  stable condition.  He was asked to stop his baby aspirin and to stay off of  all aspirin and NSAIDs indefinitely due to his history of recurrent GI  bleeding.  He had recently been placed on Ultram 50 mg t.i.d. for his  arthritic symptoms and was advised that it was okay to continue this  medication as it is not associated with GI bleeding.   DISCHARGE MEDICATIONS:  1. Other medications allopurinol 300 daily.  2. Lasix 20 daily.  3. Coreg 6.25 daily.  4. Accupril 20 daily.  5. Cosopt eye drops b.i.d.   FOLLOW UP:  The patient is to see Vania Rea. Jarold Motto, M.D. [facility] on February  13 at 10:45 a.m. and was instructed to call in the interim for any problems.  He is also advised to maintain his regular follow-up with Lonzo Cloud. Kriste Basque,  M.D. [facility] and Duke Salvia, M.D. [facility] and Jesse Sans. Marner, M.D. [facility] from a  cardiology standpoint.   DIET ON  DISCHARGE:  Regular.     Amy Esterwood, P.A.-C. [facility]                John N. Marina Goodell, M.D. [facility]    AE/MEDQ  D:  07/02/2002  T:  07/02/2002  Job:  [id number]

## 2010-10-19 NOTE — Discharge Summary (Signed)
NAMEAJAHNI, NAY               ACCOUNT NO.:  [id number]   MEDICAL RECORD NO.:  [id number]          PATIENT TYPE:  OIB   LOCATION:  2899                         FACILITY:  [facility]   PHYSICIAN:  Duke Salvia, M.D.  DATE OF BIRTH:  [birth year]   DATE OF ADMISSION:  11/23/2004  DATE OF DISCHARGE:  11/23/2004                                 DISCHARGE SUMMARY   ADDENDUM:  I just dictated a complete discharge note for him on June 23.  The number is [id number].  I would like a copy of that full discharge note to go  to [facility]. Kriste Basque, M.D., his primary caregiver, also to Duke Salvia,  M.D., at [facility] office, also Jesse Sans. Granade, M.D.,  [facility] [place] office.  Thanks.  I hope you can transpose  this to that other discharge and sent the other discharge that was dictated  to Mr. Raider June 23 to those folks.  Thanks.      Debbora Lacrosse   GM/MEDQ  D:  11/23/2004  T:  11/24/2004  Job:  [id number]

## 2010-10-19 NOTE — Discharge Summary (Signed)
NAMEHAIDER, HORNADAY               ACCOUNT NO.:  [id number]   MEDICAL RECORD NO.:  [id number]          PATIENT TYPE:  INP   LOCATION:  3713                         FACILITY:  [facility]   PHYSICIAN:  Jonelle Sidle, M.D. LHCDATE OF BIRTH:  16-Sep-1915   DATE OF ADMISSION:  03/12/2005  DATE OF DISCHARGE:  03/13/2005                           DISCHARGE SUMMARY - REFERRING   SUMMARY OF HISTORY:  Mr. Kirker is an 77 year old African American male who  presented to the emergency room on the morning of admission after he was  awakened at approximately midnight complaining of fullness and tightness in  the epigastric area.  He described the discomfort as waxing and waning  throughout the night and he felt it was secondary to gas.  He did not take  any medications to try to relieve the discomfort and he presented to the  emergency room for further assessment.  He received two sublingual  nitroglycerins with resolution.  He was admitted for observation.   Mr. Vanbeek history is notable for [employer] pacer insertion by Dr. Graciela Husbands  secondary to complete heart block.  This was inserted in June of 2006,  replacing the original device in 1994.  Original insertion was complicated  by cardiac tamponade requiring pericardiocentesis.  Prior Cardiolite in  April of 2005, shows scarring but no ischemia.  EF was 45%.  He also has a  history of hypertension, GI bleed, status post colectomy with ileal distal  colonic anastomosis, status post cataract surgery, tonsillectomy, __________  due to chronic subdermal hematoma, prostate cancer.   LABORATORY DATA:  Chest x-ray shows cardiomegaly and pulmonary vascular  congestion with bibasilar atelectasis.  Abdominal and pelvic CT did not show  any acute abnormality.   Admission H&H was 16.3 and 48.0, normal indices, platelets 186, wbc 11.0.  Subsequent hematologies were unremarkable.  Admission PT was 12.8.  Sodium  140, potassium 4.2, BUN 15, creatinine 1.2.  CMET on  March 12, 2005, was  essentially normal; however, AST and ALT were slightly elevated at 45 and  53.  Lipase was within normal limits at 25.  CK-MB and relative index x3  were negative for myocardial infarction.  Troponins x2 were negative for  myocardial infarction.  Fasting lipids showed a total cholesterol of 152,  triglycerides 86, HDL 33, LDL 82.   EKGs showed ventricular pacing.   HOSPITAL COURSE:  Mr. Wesche was admitted to 3700.  Overnight he did not have  any further discomfort.  He had been placed on IV heparin and ruled out for  myocardial infarction.  After being assessed by Dr. Dietrich Pates on the morning  of March 13, 2005, it was felt that he could be discharged home with  outpatient stress testing and follow-up with his cardiologist.   DISCHARGE DIAGNOSES:  1.  Abdominal discomfort of uncertain etiology.  2.  Mildly elevated AST and ALT of uncertain etiology.  3.  History as listed below.   DISPOSITION:  Mr. Vanzanten is discharged home.  He is asked to begin a coated  baby aspirin 81 mg daily and Dr. Dietrich Pates has given him a prescription  for  sublingual nitroglycerin as needed.   He was asked to continue his home medications which include:  1.  Coreg 6.25 mg b.i.d.  2.  Lasix 20 daily.  3.  Allopurinol 300 daily.  4.  Quinapril 20 mg b.i.d.  5.  Multivitamin daily.   He was advised to maintain low salt, fat and cholesterol diet.   His activities were not restricted.   He will have an adenosine Myoview performed in our office on March 18, 2005, at 12 o'clock.  He was advised nothing to eat or drink after midnight  except that he may take medications with a small sip of water.  He will then  follow up with Dr. Vern Claude physician assistant, Joellyn Rued, in the office  on March 28, 2005, at 12 o'clock (it is noted that Dr. Daleen Squibb does not have  appointments available).      Joellyn Rued, P.A. [facility]    ______________________________  Jonelle Sidle, M.D.  [facility]    EW/MEDQ  D:  03/13/2005  T:  03/13/2005  Job:  [id number]   cc:   Archer Bing, M.D. [facility]  [address]  [address]  [place]  [place] [address]   Jesse Sans. Spinnato, M.D.  [address]  [address]  [place]  [place] [address]   Lonzo Cloud. Kriste Basque, M.D. [facility]  [address]  [place]  [place] [address]   Duke Salvia, M.D.  [address]  [address]  [place]  [place] [address]

## 2010-10-23 ENCOUNTER — Encounter: Payer: Self-pay | Admitting: Internal Medicine

## 2010-10-23 DIAGNOSIS — I442 Atrioventricular block, complete: Secondary | ICD-10-CM

## 2010-11-19 ENCOUNTER — Other Ambulatory Visit: Payer: Self-pay | Admitting: Cardiology

## 2010-12-20 ENCOUNTER — Ambulatory Visit: Payer: Self-pay | Admitting: Pulmonary Disease

## 2010-12-21 ENCOUNTER — Encounter: Payer: Self-pay | Admitting: Internal Medicine

## 2011-01-14 ENCOUNTER — Ambulatory Visit (INDEPENDENT_AMBULATORY_CARE_PROVIDER_SITE_OTHER): Payer: Medicare Other | Admitting: Pulmonary Disease

## 2011-01-14 ENCOUNTER — Encounter: Payer: Self-pay | Admitting: Pulmonary Disease

## 2011-01-14 DIAGNOSIS — M1990 Unspecified osteoarthritis, unspecified site: Secondary | ICD-10-CM

## 2011-01-14 DIAGNOSIS — K5731 Diverticulosis of large intestine without perforation or abscess with bleeding: Secondary | ICD-10-CM

## 2011-01-14 DIAGNOSIS — Z950 Presence of cardiac pacemaker: Secondary | ICD-10-CM

## 2011-01-14 DIAGNOSIS — I442 Atrioventricular block, complete: Secondary | ICD-10-CM

## 2011-01-14 DIAGNOSIS — M109 Gout, unspecified: Secondary | ICD-10-CM

## 2011-01-14 DIAGNOSIS — I10 Essential (primary) hypertension: Secondary | ICD-10-CM

## 2011-01-14 DIAGNOSIS — I2510 Atherosclerotic heart disease of native coronary artery without angina pectoris: Secondary | ICD-10-CM

## 2011-01-14 DIAGNOSIS — Z8546 Personal history of malignant neoplasm of prostate: Secondary | ICD-10-CM

## 2011-01-14 DIAGNOSIS — J209 Acute bronchitis, unspecified: Secondary | ICD-10-CM

## 2011-01-14 NOTE — Progress Notes (Signed)
Subjective:    Patient ID: Edward Harvey, male    DOB: 26-Oct-1915, 77 y.o.   MRN: [id number]  HPI 77 y/o BM here for a follow up visit... he has multiple medical problems including HBP, CAD, & complete heart block w/ pacer followed by DrWall & DrKlein;  hx of GI bleed from divertics w/ subtotal colectomy in 2003 & followed by DrPatterson;  hx SBO, inguinal hernias, gallstones;  hx prostate cancer followed by DrOttelin;  DJD & Gout;  hx subdural hematoma prev requiring burr hole, & mild senile dementia...  ~  [id number]:  he was recently at his podiatrist office having his toenails trimmed & he mentioned that [name] needed to be checked for DM... the pt became concerned & despite the fact that he is not diabetic & all BS checks in the past have been normal, he didn't want to wait until his next f/u visit in Jan12, so he asked for this work-in appt... we discussed DM & agreed to check BMet (norm- BS=79) & an A1c (5.2)> he is reassured.  ~  June 21, 2010:  77 y/o & doing satis w/ multisys dis> he saw DrWall recently for f/u CAD & pacer for CHB- stable, same meds, f/u 81yr... he denies resp symptoms w/o cough, sputum, incr SOB;  denies CP, palpit, ch in edema;  no abd pain, swelling, n/v, or blood seen;  due for f/u lab work> same meds...  ~  September 20, 2010:  Here w/ his son for an add-on appt to complete some papers for the [employer]> pt & his wife still live indepedently but are having increased difficulty according to son;  They are applying for [employer] benefits wanting someone to come into the home to essentially clean & cook for them> the forms were completed w/ the help of pt & his son;  Medically [name] is stable> HOH w/o change; Breathing stable & chest clear; Denies CP/ palpit/ ch in DOE, edema, etc; GI is stable as well, and CC= DJD w/ difficulty ambulating etc...  ~  January 14, 2011:  17mo ROV & doing satis w/o new complaints or concerns; we reviewed his current meds & prev Labs & XRays...   AB> no recent exac, breathing stable w/o cough, sputum, dyspnea...    HBP, CAD, AVBlock, Pacer> he denies CP, palpit, ch in SOB, edema, etc; BP= 110/68 & tolerating meds well...    Divertics, Hx SBO, Ing Hernias, Gallstones> he continues to do well w/o Abd/GI symptoms...    Prostate Ca> followed by DrOttelin & stable "just slow" he says...    DJD, Gout> ambulates w/ cane & doing ok by his r3view...        Problem List:    GLAUCOMA (ICD-365.9) - prev followed by Deland Pretty w/ bilat cataract surg & glaucoma on eye 3 diff drops as noted...  HEARING LOSS - son says he has 3 sets of hearing aides but won't wear any of them...  Hx of ASTHMATIC BRONCHITIS, ACUTE (ICD-466.0) - he has never smoked... hx recurrent bronchitic infections over the yrs w/ reactive airway component... no recent prob, and no regular meds required.   HYPERTENSION (ICD-401.9) - on ASA 81mg /d, COREG 6.25Bid, QUINAPRIL 20mg Bid, LASIX 20mg /d... He takes his own meds & states no problems. ~  4/12:  BP= 110/74 & feeling well... he denies HA, visual changes, CP, palipit, dizziness, syncope, change in dyspnea, etc... ~  8/12:  BP= 110/68 & continues stable...  CORONARY ARTERY DISEASE (ICD-414.00) -  followed by DrWall for Cardiology on above meds... pt has not had a prev cath... ~  2DEcho 4/05 showed mild conc LVH and norm LVF- improved from 2002 when EF= 45%... ~  NuclearStressTest 10/06 showed prior inferoseptal & apical infarct w/ apical AK, w/o ischemia, EF= 48%... no change from prev.  Hx of AV BLOCK, COMPLETE (ICD-426.0), & CARDIAC PACEMAKER IN SITU (ICD-V45.01) - he had a pacemaker placed for complete heart block initially in 1994, & this was exchanged for a new dual chamber pacer: Valene Bors DDDR model [phone] by [facility] 6/06... ~  seen by DrKlein 1/10 w/ pacer check OK, no further episodes of AFib on his monitor... not a Coumadin candidate due to age, unsteady, hx of GI bleeds, and prev subdural hematoma. ~  followed regularly by  DrKlein's pacer clinic> stable, doing well, no changes made.  DIVERTICULOSIS OF COLON WITH HEMORRHAGE (ICD-562.12)  GASTROINTESTINAL HEMORRHAGE (ICD-578.9) - he had a subtotal colectomy w/ ileum to distal sigmoid anastomosis in Jul03 by DrNewman... ~  EGD 8/03 by Dorris Singh was normal... ~  colonoscopy 1/08 by DrPatterson showed inflammed mucosa in sm bowel prox to the ileo-colonic anastomosis, few divertics in remaining distal colon, no polyps etc...  ~  f/u colonoscopy 5/09 showed prev colectomy- anastomosis granular & bleeding ?Crohn's... also had divertics & hems... Rx'd w/ Lialda.  Hx of SMALL BOWEL OBSTRUCTION (ICD-560.9) > see [id number] Hospitalization by [facility]...  INGUINAL HERNIA (ICD-550.90) - he has bilat fat containing inguinal hernias seen on CT Abd in 2008...   GALLSTONES (ICD-574.20) - Sonar 4/11 showed mult gallstones filling the gallbladder, and CT Abd 4/11 showed mild extrahep biliary ductal dilatation... he was evaluated by DrPatterson/ GI, DrNewman/ Surg, DrWall/ Cards> all agreed that risk was hi & to hold off on surg unless absolutely necessary... ~  4/12 & 8/12:  He remains asymptomatic w/o abd pain, N/V, etc...  CARCINOMA, PROSTATE, HX OF (ICD-V10.46) - diagnosed in 1993 & treated w/ XRT... PSA initially ~29 and dropped to ~1 after XRT & it has remained there ever since... followed by DrOttelin- also has BPH, min obstructive symptoms, bilat hydroceles & some benign renal cysts... ~  labs 3/07 by Urology showed PSA= 1.19 ~  labs here 1/10 showed PSA= 1.37 ~  5/11: he reports f/u DrOttelin w/ incontinence symptoms Rx'd [place] XR 60mg /d & improved, so he stopped this on his own & states that he is doing satis now... ~  He continues to f/u w/ DrOttelin regularly...  DEGENERATIVE JOINT DISEASE (ICD-715.90)  Hx of GOUT (ICD-274.9) - he has hx of DJD and Gout treated w/ TRAMADOL Prn & ALLOPURINOL 300mg /d... also takes MVI & VIT D 1000 u daily...  HEMATOMA, SUBDURAL (ICD-432.1) -  he had a burr hole placed for subdural hematoma in the past...    Past Surgical History  Procedure Date  . Pacemaker placement   . Cataract extraction, bilateral   . Tonsillectomy age 77  . Subtotal colectomy 12/03    Dr. Ezzard Standing  . Lipoma excision 2001    left leg    Outpatient Encounter Prescriptions as of 01/14/2011  Medication Sig Dispense Refill  . allopurinol (ZYLOPRIM) 300 MG tablet Take 300 mg by mouth daily.        Marland Kitchen aspirin 81 MG chewable tablet Chew 81 mg by mouth daily.        . brimonidine (ALPHAGAN P) 0.1 % SOLN Apply 1 drop to eye 3 (three) times daily.        . carvedilol (  COREG) 6.25 MG tablet Take 6.25 mg by mouth 2 (two) times daily.        . dorzolamide-timolol (COSOPT) 22.3-6.8 MG/ML ophthalmic solution Place 1 drop into both eyes 2 (two) times daily.        . furosemide (LASIX) 20 MG tablet Take 20 mg by mouth daily.        Marland Kitchen latanoprost (XALATAN) 0.005 % ophthalmic solution Place 1 drop into both eyes at bedtime.        . Multiple Vitamins-Minerals (CENTRUM SILVER PO) Take 1 tablet by mouth daily.        . quinapril (ACCUPRIL) 20 MG tablet TAKE 1 TABLET BY MOUTH TWICE A DAY  180 tablet  2  . Cholecalciferol (VITAMIN D) 1000 UNITS capsule Take 1,000 Units by mouth daily.          No Known Allergies   Current Medications, Allergies, Past Medical History, Past Surgical History, Family History, and Social History were reviewed in [employer] record.    Review of Systems        See HPI - all other systems neg except as noted... The patient complains of dyspnea on exertion, peripheral edema, muscle weakness, and difficulty walking.  The patient denies anorexia, fever, weight loss, weight gain, hoarseness, chest pain, syncope, prolonged cough, headaches, hemoptysis, abdominal pain, melena, hematochezia, severe indigestion/heartburn, hematuria, incontinence, suspicious skin lesions, transient blindness, depression, unusual weight change,  abnormal bleeding, enlarged lymph nodes, and angioedema.     Objective:   Physical Exam     WD, overweight, 77 y/o BM in NAD...  GENERAL:  Alert, pleasant & cooperative;  VS- reviewed... HEENT:  [place]/AT, EOM- sl strabismus, PERRLA, EACs-clear (HOH), TMs-wnl, NOSE-clear discharge , THROAT-clear & wnl. NECK:  Supple w/ decrROM; no JVD; normal carotid impulses w/o bruits; no thyromegaly or nodules palpated; no lymphadenopathy. CHEST:  Clear to P & A; without wheezes/ rales/ or rhonchi heard;  Pacer palp in right chest... HEART:  Regular Rhythm; without murmurs/ rubs/ or gallops detected... ABDOMEN:  Scar of prev surg, soft & nontender; normal bowel sounds; no organomegaly or masses palpated...  EXT:  Mod-severe arthritic changes; no varicose veins/ +venous insuffic/ tr edema. NEURO:  CN's intact; motor testing normal; sensory testing inconsistant; gait is abnormal & balance only fair... DERM:  No lesions noted; no rash etc...   Assessment & Plan:   HBP>  BP stable, continue same meds...  CAD, PACER>  He denies angina, palpit, syncope, etc;  Continue cardiology f/u w/ DrTomWall.  Divertics>  He has hx GI hemorrhage in past;  Stable no abd pain, etc...  Gallstones>  He remains asymptomatic...  Prostate Ca>  Followed by DrOttelin & stable...  DJD>  This is his main issue w/ mobility, self care, remaining independent...  Hx Subdural Hematoma>  Stable w/o acute problems.Marland KitchenMarland Kitchen

## 2011-01-14 NOTE — Patient Instructions (Addendum)
Today we updated your med list in EPIC...    Continue your current meds the same...  Stay as active as possible, but BE CAREFUL...  Work on weight reduction, cut back on your caloric intake...  Call for any questions...  Let's plan a follow up visit in 4 months & we will plan FASTING blood work at that time.Marland KitchenMarland Kitchen

## 2011-01-19 ENCOUNTER — Other Ambulatory Visit: Payer: Self-pay | Admitting: Cardiology

## 2011-01-19 ENCOUNTER — Other Ambulatory Visit: Payer: Self-pay | Admitting: Pulmonary Disease

## 2011-01-22 ENCOUNTER — Encounter: Payer: Medicare Other | Admitting: Internal Medicine

## 2011-01-24 ENCOUNTER — Encounter: Payer: Medicare Other | Admitting: Internal Medicine

## 2011-01-27 ENCOUNTER — Encounter: Payer: Self-pay | Admitting: Pulmonary Disease

## 2011-02-23 ENCOUNTER — Other Ambulatory Visit: Payer: Self-pay | Admitting: Cardiology

## 2011-02-28 ENCOUNTER — Encounter: Payer: Self-pay | Admitting: Internal Medicine

## 2011-02-28 DIAGNOSIS — I442 Atrioventricular block, complete: Secondary | ICD-10-CM

## 2011-03-12 ENCOUNTER — Encounter: Payer: Self-pay | Admitting: Internal Medicine

## 2011-03-12 ENCOUNTER — Ambulatory Visit (INDEPENDENT_AMBULATORY_CARE_PROVIDER_SITE_OTHER): Payer: Medicare Other | Admitting: Internal Medicine

## 2011-03-12 VITALS — BP 110/78 | HR 56 | Wt 229.0 lb

## 2011-03-12 DIAGNOSIS — Z950 Presence of cardiac pacemaker: Secondary | ICD-10-CM

## 2011-03-12 DIAGNOSIS — I442 Atrioventricular block, complete: Secondary | ICD-10-CM

## 2011-03-12 LAB — PACEMAKER DEVICE OBSERVATION
AL IMPEDENCE PM: 305 Ohm
AL THRESHOLD: 1 V
BATTERY VOLTAGE: 2.79 V
VENTRICULAR PACING PM: 100

## 2011-03-12 NOTE — Assessment & Plan Note (Signed)
The patient's device was interrogated.  The information was reviewed. No changes were made in the programming.    

## 2011-03-12 NOTE — Progress Notes (Signed)
  HPI  Edward Harvey is a 77 y.o. male followup for syncope with complete heart block. He  is status post pacemaker implantation. he has no symptoms of chest pain. No shortness of breath.he does have a lot of edema  Past Medical History  Diagnosis Date  . Glaucoma   . Acute bronchitis   . Hypertension   . CAD (coronary artery disease)   . Atrioventricular block, complete   . Cardiac pacemaker in situ   . Diverticulosis of colon with hemorrhage   . Hemorrhage of gastrointestinal tract, unspecified   . Unspecified intestinal obstruction   . Inguinal hernia   . Gallstone   . Prostate cancer   . DJD (degenerative joint disease)   . Gout   . Hematoma     subdural    Past Surgical History  Procedure Date  . Pacemaker placement   . Cataract extraction, bilateral   . Tonsillectomy age 27  . Subtotal colectomy 12/03    Dr. Ezzard Standing  . Lipoma excision 2001    left leg    Current Outpatient Prescriptions  Medication Sig Dispense Refill  . allopurinol (ZYLOPRIM) 300 MG tablet TAKE 1 TABLET BY MOUTH ONCE A DAY  90 tablet  3  . aspirin 81 MG chewable tablet Chew 81 mg by mouth daily.        . brimonidine (ALPHAGAN P) 0.1 % SOLN Apply 1 drop to eye 3 (three) times daily.        . carvedilol (COREG) 6.25 MG tablet TAKE ONE TABLET BY MOUTH TWICE A DAY  180 tablet  2  . Cholecalciferol (VITAMIN D) 1000 UNITS capsule Take 1,000 Units by mouth daily.        . dorzolamide-timolol (COSOPT) 22.3-6.8 MG/ML ophthalmic solution Place 1 drop into both eyes 2 (two) times daily.        . furosemide (LASIX) 20 MG tablet TAKE 1 TABLET BY MOUTH EVERY DAY  90 tablet  3  . latanoprost (XALATAN) 0.005 % ophthalmic solution Place 1 drop into both eyes at bedtime.        . Multiple Vitamins-Minerals (CENTRUM SILVER PO) Take 1 tablet by mouth daily.        . prednisoLONE acetate (PRED FORTE) 1 % ophthalmic suspension       . quinapril (ACCUPRIL) 20 MG tablet TAKE 1 TABLET BY MOUTH TWICE A DAY  180 tablet   2    No Known Allergies  Review of Systems negative except from HPI and PMH  Physical Exam Well developed and well nourished in no acute distress asking the same qyuestions over again HENT normal E scleral and icterus clear Neck Supple JVP flat; carotids brisk and full Clear to ausculation Regular rate and rhythm, no murmurs gallops or rub Soft with active bowel sounds No clubbing cyanosis trace edema Alert grossly normal motor and sensory function walks with support Skin Warm and Dry     Assessment and  Plan

## 2011-03-12 NOTE — Assessment & Plan Note (Signed)
Stable post pacer 

## 2011-03-12 NOTE — Patient Instructions (Signed)
Your physician wants you to follow-up in: 1 year. You will receive a reminder letter in the mail two months in advance. If you don't receive a letter, please call our office to schedule the follow-up appointment.  

## 2011-05-27 ENCOUNTER — Ambulatory Visit: Payer: Medicare Other | Admitting: Pulmonary Disease

## 2011-05-30 ENCOUNTER — Encounter: Payer: Self-pay | Admitting: Internal Medicine

## 2011-05-30 DIAGNOSIS — I442 Atrioventricular block, complete: Secondary | ICD-10-CM

## 2011-05-31 ENCOUNTER — Telehealth: Payer: Self-pay | Admitting: Internal Medicine

## 2011-05-31 NOTE — Telephone Encounter (Signed)
New Problem:    Patient called in today wanting to speak with either Dr. Daleen Squibb or Dr. Graciela Husbands because he could not understand the operator who was trying to check his pacemaker over the phone. Please advise.

## 2011-05-31 NOTE — Telephone Encounter (Signed)
I will route this to Kristin/ Gunnar Fusi since they may be able to help the patient with this issue in regards to a device check.

## 2011-05-31 NOTE — Telephone Encounter (Signed)
Spoke with Lupita Leash @ [facility].  Test was done and recorded 05/30/11.  Left message for patient.

## 2011-06-03 ENCOUNTER — Ambulatory Visit (INDEPENDENT_AMBULATORY_CARE_PROVIDER_SITE_OTHER): Payer: Medicare Other | Admitting: Pulmonary Disease

## 2011-06-03 ENCOUNTER — Encounter: Payer: Self-pay | Admitting: Pulmonary Disease

## 2011-06-03 DIAGNOSIS — I2510 Atherosclerotic heart disease of native coronary artery without angina pectoris: Secondary | ICD-10-CM

## 2011-06-03 DIAGNOSIS — M1990 Unspecified osteoarthritis, unspecified site: Secondary | ICD-10-CM

## 2011-06-03 DIAGNOSIS — K8020 Calculus of gallbladder without cholecystitis without obstruction: Secondary | ICD-10-CM

## 2011-06-03 DIAGNOSIS — M109 Gout, unspecified: Secondary | ICD-10-CM

## 2011-06-03 DIAGNOSIS — Z8546 Personal history of malignant neoplasm of prostate: Secondary | ICD-10-CM

## 2011-06-03 DIAGNOSIS — I10 Essential (primary) hypertension: Secondary | ICD-10-CM

## 2011-06-03 DIAGNOSIS — K5731 Diverticulosis of large intestine without perforation or abscess with bleeding: Secondary | ICD-10-CM

## 2011-06-03 DIAGNOSIS — Z950 Presence of cardiac pacemaker: Secondary | ICD-10-CM

## 2011-06-03 DIAGNOSIS — I442 Atrioventricular block, complete: Secondary | ICD-10-CM

## 2011-06-03 NOTE — Progress Notes (Signed)
Subjective:    Patient ID: Edward Harvey, male    DOB: 1915/09/01, 77 y.o.   MRN: [id number]  HPI 77 y/o BM here for a follow up visit... he has multiple medical problems including HBP, CAD, & complete heart block w/ pacer followed by DrWall & DrKlein;  hx of GI bleed from divertics w/ subtotal colectomy in 2003 & followed by DrPatterson;  hx SBO, inguinal hernias, gallstones;  hx prostate cancer followed by DrOttelin;  DJD & Gout;  hx subdural hematoma prev requiring burr hole, & mild senile dementia...  ~  [id number]:  he was recently at his podiatrist office having his toenails trimmed & he mentioned that [name] needed to be checked for DM... the pt became concerned & despite the fact that he is not diabetic & all BS checks in the past have been normal, he didn't want to wait until his next f/u visit in Jan12, so he asked for this work-in appt... we discussed DM & agreed to check BMet (norm- BS=79) & an A1c (5.2)> he is reassured.  ~  June 21, 2010:  77 y/o & doing satis w/ multisys dis> he saw DrWall recently for f/u CAD & pacer for CHB- stable, same meds, f/u 33yr... he denies resp symptoms w/o cough, sputum, incr SOB;  denies CP, palpit, ch in edema;  no abd pain, swelling, n/v, or blood seen;  due for f/u lab work> same meds...  ~  September 20, 2010:  Here w/ his son for an add-on appt to complete some papers for the [facility]> pt & his wife still live indepedently but are having increased difficulty according to son;  They are applying for [employer] Aid [employer]end[employer] benefits wanting someone to come into the home to essentially clean & cook for them> the forms were completed w/ the help of pt & his son;  Medically [name] is stable> HOH w/o change; Breathing stable & chest clear; Denies CP/ palpit/ ch in DOE, edema, etc; GI is stable as well, and CC= DJD w/ difficulty ambulating etc...  ~  January 14, 2011:  21mo ROV & doing satis w/o new complaints or concerns; we reviewed his current meds & prev Labs & XRays...   AB> no recent exac, breathing stable w/o cough, sputum, dyspnea...    HBP, CAD, AVBlock, Pacer> he denies CP, palpit, ch in SOB, edema, etc; BP= 110/68 & tolerating meds well...    Divertics, Hx SBO, Ing Hernias, Gallstones> he continues to do well w/o Abd/GI symptoms...    Prostate Ca> followed by DrOttelin & stable "just slow" he says...    DJD, Gout> ambulates w/ cane & doing ok by his review...  ~  June 03, 2011:  21mo ROV & he says "getting older & slower" but notes that he & his wife will celebrate their 72nd Christian Mate this coming yr...  AB> no recent URI or breathing prob reported...  HBP> on ASA81, Coreg6.25Bid, Quinapril20Bid, Lasix20; wt is down 5# on diet to 223#; BP= 116/78...  CAD/ AVblock/ Pacer> on meds above; he denies CP, palpit, ch in dyspnea or edema...  Divertics w/ hx hemorrhage/ Hx SBO> he denies abd pain, N/V, D/C, ch in bowels or blood seen...  Hx Prostate Cancer> followed by DrOttelin but we don't have recent notes...  DJD/ Gout> on Allopurinol300, OTC analgesics prn & MVI/ Vit D supplement;   Hx subdural hematoma> required burr holes back then; no known sequellae & he hasn't been falling.Marland KitchenMarland Kitchen  Problem List:    GLAUCOMA (ICD-365.9) - prev followed by Deland Pretty w/ bilat cataract surg & glaucoma on eye 3 diff drops as noted...  HEARING LOSS - son says he has 3 sets of hearing aides but won't wear any of them...  Hx of ASTHMATIC BRONCHITIS, ACUTE (ICD-466.0) - he has never smoked... hx recurrent bronchitic infections over the yrs w/ reactive airway component... no recent prob, and no regular meds required.   HYPERTENSION (ICD-401.9) - on ASA 81mg /d, COREG 6.25Bid, QUINAPRIL 20mg Bid, LASIX 20mg /d... He takes his own meds & states no problems. ~  4/12:  BP= 110/74 & feeling well... he denies HA, visual changes, CP, palipit, dizziness, syncope, change in dyspnea, etc... ~  8/12:  BP= 110/68 & continues stable... ~  12/12:  BP= 116/78 & he remains  asymptomatic...  CORONARY ARTERY DISEASE (ICD-414.00) - followed by DrWall for Cardiology on above meds... pt has not had a prev cath... ~  2DEcho 4/05 showed mild conc LVH and norm LVF- improved from 2002 when EF= 45%... ~  NuclearStressTest 10/06 showed prior inferoseptal & apical infarct w/ apical AK, w/o ischemia, EF= 48%... no change from prev.  Hx of AV BLOCK, COMPLETE (ICD-426.0), & CARDIAC PACEMAKER IN SITU (ICD-V45.01) - he had a pacemaker placed for complete heart block initially in 1994, & this was exchanged for a new dual chamber pacer: Valene Bors DDDR model [phone] by [facility] 6/06... ~  seen by DrKlein 1/10 w/ pacer check OK, no further episodes of AFib on his monitor... not a Coumadin candidate due to age, unsteady, hx of GI bleeds, and prev subdural hematoma. ~  followed regularly by DrKlein's pacer clinic (last note 10/12 reviewed)> stable, doing well, no changes made.  DIVERTICULOSIS OF COLON WITH HEMORRHAGE (ICD-562.12)  GASTROINTESTINAL HEMORRHAGE (ICD-578.9) - he had a subtotal colectomy w/ ileum to distal sigmoid anastomosis in Jul03 by DrNewman... ~  EGD 8/03 by Dorris Singh was normal... ~  colonoscopy 1/08 by DrPatterson showed inflammed mucosa in sm bowel prox to the ileo-colonic anastomosis, few divertics in remaining distal colon, no polyps etc...  ~  f/u colonoscopy 5/09 showed prev colectomy- anastomosis granular & bleeding ?Crohn's... also had divertics & hems... Rx'd w/ Lialda.  Hx of SMALL BOWEL OBSTRUCTION (ICD-560.9) > see [id number] Hospitalization by [facility]...  INGUINAL HERNIA (ICD-550.90) - he has bilat fat containing inguinal hernias seen on CT Abd in 2008...   GALLSTONES (ICD-574.20) - Sonar 4/11 showed mult gallstones filling the gallbladder, and CT Abd 4/11 showed mild extrahep biliary ductal dilatation... he was evaluated by DrPatterson/ GI, DrNewman/ Surg, DrWall/ Cards> all agreed that risk was hi & to hold off on surg unless absolutely necessary... ~  4/12 &  8/12:  He remains asymptomatic w/o abd pain, N/V, etc...  CARCINOMA, PROSTATE, HX OF (ICD-V10.46) - diagnosed in 1993 & treated w/ XRT... PSA initially ~29 and dropped to ~1 after XRT & it has remained there ever since... followed by DrOttelin- also has BPH, min obstructive symptoms, bilat hydroceles & some benign renal cysts... ~  labs 3/07 by Urology showed PSA= 1.19 ~  labs here 1/10 showed PSA= 1.37 ~  5/11: he reports f/u DrOttelin w/ incontinence symptoms Rx'd [place] XR 60mg /d & improved, so he stopped this on his own & states that he is doing satis now... ~  He continues to f/u w/ DrOttelin regularly- we do not have recent notes from Urology...  DEGENERATIVE JOINT DISEASE (ICD-715.90)  Hx of GOUT (ICD-274.9) - he has hx of DJD and  Gout treated w/ TRAMADOL Prn & ALLOPURINOL 300mg /d... also takes MVI & VIT D 1000 u daily...  HEMATOMA, SUBDURAL (ICD-432.1) - he had a burr hole placed for subdural hematoma in the past...    Past Surgical History  Procedure Date  . Pacemaker placement   . Cataract extraction, bilateral   . Tonsillectomy age 54  . Subtotal colectomy 12/03    Dr. Ezzard Standing  . Lipoma excision 2001    left leg    Outpatient Encounter Prescriptions as of 06/03/2011  Medication Sig Dispense Refill  . allopurinol (ZYLOPRIM) 300 MG tablet TAKE 1 TABLET BY MOUTH ONCE A DAY  90 tablet  3  . aspirin 81 MG chewable tablet Chew 81 mg by mouth daily.        . brimonidine (ALPHAGAN P) 0.1 % SOLN Apply 1 drop to eye 3 (three) times daily.        . carvedilol (COREG) 6.25 MG tablet TAKE ONE TABLET BY MOUTH TWICE A DAY  180 tablet  2  . Cholecalciferol (VITAMIN D) 1000 UNITS capsule Take 1,000 Units by mouth daily.        . dorzolamide-timolol (COSOPT) 22.3-6.8 MG/ML ophthalmic solution Place 1 drop into both eyes 2 (two) times daily.        . furosemide (LASIX) 20 MG tablet TAKE 1 TABLET BY MOUTH EVERY DAY  90 tablet  3  . latanoprost (XALATAN) 0.005 % ophthalmic solution Place 1  drop into both eyes at bedtime.        . Multiple Vitamins-Minerals (CENTRUM SILVER PO) Take 1 tablet by mouth daily.        . quinapril (ACCUPRIL) 20 MG tablet TAKE 1 TABLET BY MOUTH TWICE A DAY  180 tablet  2  . DISCONTD: prednisoLONE acetate (PRED FORTE) 1 % ophthalmic suspension         No Known Allergies   Current Medications, Allergies, Past Medical History, Past Surgical History, Family History, and Social History were reviewed in [employer] record.    Review of Systems        See HPI - all other systems neg except as noted... The patient complains of dyspnea on exertion, peripheral edema, muscle weakness, and difficulty walking.  The patient denies anorexia, fever, weight loss, weight gain, hoarseness, chest pain, syncope, prolonged cough, headaches, hemoptysis, abdominal pain, melena, hematochezia, severe indigestion/heartburn, hematuria, incontinence, suspicious skin lesions, transient blindness, depression, unusual weight change, abnormal bleeding, enlarged lymph nodes, and angioedema.     Objective:   Physical Exam     WD, overweight, 77 y/o BM in NAD...  GENERAL:  Alert, pleasant & cooperative;  VS- reviewed... HEENT:  [place]/AT, EOM- sl strabismus, PERRLA, EACs-clear (HOH), TMs-wnl, NOSE-clear discharge , THROAT-clear & wnl. NECK:  Supple w/ decrROM; no JVD; normal carotid impulses w/o bruits; no thyromegaly or nodules palpated; no lymphadenopathy. CHEST:  Clear to P & A; without wheezes/ rales/ or rhonchi heard;  Pacer palp in right chest... HEART:  Regular Rhythm; without murmurs/ rubs/ or gallops detected... ABDOMEN:  Scar of prev surg, soft & nontender; normal bowel sounds; no organomegaly or masses palpated...  EXT:  Mod-severe arthritic changes; no varicose veins/ +venous insuffic/ tr edema. NEURO:  CN's intact; motor testing normal; sensory testing inconsistant; gait is abnormal & balance only fair... DERM:  No lesions noted; no rash  etc...  RADIOLOGY DATA:  Reviewed in the EPIC EMR & discussed w/ the patient...  LABORATORY DATA:  Reviewed in the EPIC EMR & discussed  w/ the patient...   Assessment & Plan:   HBP>  BP stable, continue same meds...  CAD, PACER>  He denies angina, palpit, syncope, etc;  Continue cardiology f/u w/ DrTomWall & DrKlein.  Divertics>  He has hx GI hemorrhage in past;  Stable no abd pain, etc...  Gallstones>  He remains asymptomatic...  Prostate Ca>  Followed by DrOttelin & stable...  DJD>  This is his main issue w/ mobility, self care, remaining independent...  Hx Subdural Hematoma>  Stable w/o acute problems...   Patient's Medications  New Prescriptions   No medications on file  Previous Medications   ALLOPURINOL (ZYLOPRIM) 300 MG TABLET    TAKE 1 TABLET BY MOUTH ONCE A DAY   ASPIRIN 81 MG CHEWABLE TABLET    Chew 81 mg by mouth daily.     BRIMONIDINE (ALPHAGAN P) 0.1 % SOLN    Apply 1 drop to eye 3 (three) times daily.     CARVEDILOL (COREG) 6.25 MG TABLET    TAKE ONE TABLET BY MOUTH TWICE A DAY   CHOLECALCIFEROL (VITAMIN D) 1000 UNITS CAPSULE    Take 1,000 Units by mouth daily.     DORZOLAMIDE-TIMOLOL (COSOPT) 22.3-6.8 MG/ML OPHTHALMIC SOLUTION    Place 1 drop into both eyes 2 (two) times daily.     FUROSEMIDE (LASIX) 20 MG TABLET    TAKE 1 TABLET BY MOUTH EVERY DAY   LATANOPROST (XALATAN) 0.005 % OPHTHALMIC SOLUTION    Place 1 drop into both eyes at bedtime.     MULTIPLE VITAMINS-MINERALS (CENTRUM SILVER PO)    Take 1 tablet by mouth daily.     QUINAPRIL (ACCUPRIL) 20 MG TABLET    TAKE 1 TABLET BY MOUTH TWICE A DAY  Modified Medications   No medications on file  Discontinued Medications   PREDNISOLONE ACETATE (PRED FORTE) 1 % OPHTHALMIC SUSPENSION

## 2011-06-03 NOTE — Patient Instructions (Signed)
Today we updated your med list in our EPIC system...    Continue your current medications the same...  Remember to eliminate all the salt from your diet & keep your legs up as much as possible...  Stay as active as possible too...  Call for any questions...  Let's plan a follow up visit in about 4 months w/ FASTING blood work at that time.Marland KitchenMarland Kitchen

## 2011-06-05 ENCOUNTER — Ambulatory Visit (INDEPENDENT_AMBULATORY_CARE_PROVIDER_SITE_OTHER): Payer: Medicare Other | Admitting: *Deleted

## 2011-06-05 ENCOUNTER — Encounter: Payer: Self-pay | Admitting: Internal Medicine

## 2011-06-05 DIAGNOSIS — Z950 Presence of cardiac pacemaker: Secondary | ICD-10-CM

## 2011-06-05 DIAGNOSIS — I442 Atrioventricular block, complete: Secondary | ICD-10-CM | POA: Diagnosis not present

## 2011-06-05 LAB — PACEMAKER DEVICE OBSERVATION
ATRIAL PACING PM: 37
BATTERY VOLTAGE: 2.76 V
VENTRICULAR PACING PM: 99

## 2011-06-05 NOTE — Progress Notes (Signed)
Pacer check in clinic  

## 2011-07-01 DIAGNOSIS — H4011X0 Primary open-angle glaucoma, stage unspecified: Secondary | ICD-10-CM | POA: Diagnosis not present

## 2011-07-01 DIAGNOSIS — H409 Unspecified glaucoma: Secondary | ICD-10-CM | POA: Diagnosis not present

## 2011-07-04 ENCOUNTER — Encounter: Payer: Self-pay | Admitting: Cardiology

## 2011-07-04 ENCOUNTER — Ambulatory Visit (INDEPENDENT_AMBULATORY_CARE_PROVIDER_SITE_OTHER): Payer: Medicare Other | Admitting: Cardiology

## 2011-07-04 VITALS — BP 118/68 | HR 60 | Ht 73.0 in | Wt 213.0 lb

## 2011-07-04 DIAGNOSIS — Z8546 Personal history of malignant neoplasm of prostate: Secondary | ICD-10-CM | POA: Diagnosis not present

## 2011-07-04 DIAGNOSIS — I2510 Atherosclerotic heart disease of native coronary artery without angina pectoris: Secondary | ICD-10-CM

## 2011-07-04 DIAGNOSIS — Z950 Presence of cardiac pacemaker: Secondary | ICD-10-CM | POA: Diagnosis not present

## 2011-07-04 DIAGNOSIS — I442 Atrioventricular block, complete: Secondary | ICD-10-CM | POA: Diagnosis not present

## 2011-07-04 NOTE — Progress Notes (Signed)
HPI Edward Harvey returns today for a worse and management of his history of subclinical coronary artery disease, history of complete AV block, status post pacemaker for syncope. He is 78 years old and still remains very active. He has no chest pain or palpitations.  He denies any falls. He is very compliant with his medications.  Past Medical History  Diagnosis Date  . Glaucoma   . Acute bronchitis   . Hypertension   . CAD (coronary artery disease)     nuclear stress test 2006 inferoseptal and apical infarct ejection fraction 48%  . Atrioventricular block, complete   . Cardiac pacemaker in situ   . Diverticulosis of colon with hemorrhage   . Hemorrhage of gastrointestinal tract, unspecified   . Unspecified intestinal obstruction   . Inguinal hernia   . Gallstone   . Prostate cancer   . DJD (degenerative joint disease)   . Gout   . Hematoma     subdural    Current Outpatient Prescriptions  Medication Sig Dispense Refill  . allopurinol (ZYLOPRIM) 300 MG tablet TAKE 1 TABLET BY MOUTH ONCE A DAY  90 tablet  3  . aspirin 81 MG chewable tablet Chew 81 mg by mouth daily.        . brimonidine (ALPHAGAN P) 0.1 % SOLN Apply 1 drop to eye 3 (three) times daily.        . carvedilol (COREG) 6.25 MG tablet TAKE ONE TABLET BY MOUTH TWICE A DAY  180 tablet  2  . Cholecalciferol (VITAMIN D) 1000 UNITS capsule Take 1,000 Units by mouth daily.        . dorzolamide-timolol (COSOPT) 22.3-6.8 MG/ML ophthalmic solution Place 1 drop into both eyes 2 (two) times daily.        . furosemide (LASIX) 20 MG tablet TAKE 1 TABLET BY MOUTH EVERY DAY  90 tablet  3  . latanoprost (XALATAN) 0.005 % ophthalmic solution Place 1 drop into both eyes at bedtime.        . Multiple Vitamins-Minerals (CENTRUM SILVER PO) Take 1 tablet by mouth daily.        . quinapril (ACCUPRIL) 20 MG tablet TAKE 1 TABLET BY MOUTH TWICE A DAY  180 tablet  2    No Known Allergies  No family history on file.  History   Social History    . Marital Status: Married    Spouse Name: N/A    Number of Children: 4  . Years of Education: N/A   Occupational History  . Retired    Social History Main Topics  . Smoking status: Never Smoker   . Smokeless tobacco: Never Used  . Alcohol Use: Yes     Occ wine   . Drug Use: No  . Sexually Active: Not on file   Other Topics Concern  . Not on file   Social History Narrative  . No narrative on file    ROS ALL NEGATIVE EXCEPT THOSE NOTED IN HPI  PE  General Appearance: well developed, well nourished in no acute distress, hard of hearing, elderly HEENT: symmetrical face, PERRLA, good dentition  Neck: no JVD, thyromegaly, or adenopathy, trachea midline Chest: symmetric without deformity Cardiac: PMI non-displaced, RRR, normal S1, S2, no gallop or murmur Lung: clear to ausculation and percussion Vascular: all pulses full without bruits  Abdominal: nondistended, nontender, good bowel sounds, no HSM, no bruits Extremities: no cyanosis, clubbing , mild edema of the right lower extremity at the ankle no sign of DVT, no  varicosities  Skin: normal color, no rashes Neuro: alert and oriented x 3, non-focal Pysch: normal affect  EKG  BMET    Component Value Date/Time   NA 137 06/21/2010 1156   K 4.1 06/21/2010 1156   CL 103 06/21/2010 1156   CO2 27 06/21/2010 1156   GLUCOSE 77 06/21/2010 1156   BUN 24* 06/21/2010 1156   CREATININE 1.5 06/21/2010 1156   CALCIUM 8.9 06/21/2010 1156   GFRNONAA 81.22 04/06/2010 1024   GFRAA  Value: >60        The eGFR has been calculated using the MDRD equation. This calculation has not been validated in all clinical situations. eGFR's persistently <60 mL/min signify possible Chronic Kidney Disease. 08/03/2009 0440    Lipid Panel     Component Value Date/Time   CHOL  Value: 74        ATP III CLASSIFICATION:  <200     mg/dL   Desirable  161-096  mg/dL   Borderline High  >=045    mg/dL   High        09/09/8117 0115   TRIG 123 07/23/2009 0115   HDL 16*  07/23/2009 0115   CHOLHDL 4.6 07/23/2009 0115   VLDL 25 07/23/2009 0115   LDLCALC  Value: 33        Total Cholesterol/HDL:CHD Risk Coronary Heart Disease Risk Table                     Men   Women  1/2 Average Risk   3.4   3.3  Average Risk       5.0   4.4  2 X Average Risk   9.6   7.1  3 X Average Risk  23.4   11.0        Use the calculated Patient Ratio above and the CHD Risk Table to determine the patient's CHD Risk.        ATP III CLASSIFICATION (LDL):  <100     mg/dL   Optimal  147-829  mg/dL   Near or Above                    Optimal  130-159  mg/dL   Borderline  562-130  mg/dL   High  >865     mg/dL   Very High 7/84/6962 9528    CBC    Component Value Date/Time   WBC 8.7 06/21/2010 1156   RBC 3.54* 06/21/2010 1156   HGB 12.5* 06/21/2010 1156   HCT 36.7* 06/21/2010 1156   PLT 178.0 06/21/2010 1156   MCV 103.8* 06/21/2010 1156   MCHC 34.0 06/21/2010 1156   RDW 15.5* 06/21/2010 1156   LYMPHSABS 1.4 06/21/2010 1156   MONOABS 0.7 06/21/2010 1156   EOSABS 0.1 06/21/2010 1156   BASOSABS 0.0 06/21/2010 1156

## 2011-07-04 NOTE — Patient Instructions (Signed)
Your physician wants you to follow-up in: 1 year with Dr. Schumacher. You will receive a reminder letter in the mail two months in advance. If you don't receive a letter, please call our office to schedule the follow-up appointment.  Your physician recommends that you continue on your current medications as directed. Please refer to the Current Medication list given to you today.  

## 2011-07-04 NOTE — Assessment & Plan Note (Signed)
No symptoms of angina or ischemia. Continue medical therapy. 

## 2011-09-19 ENCOUNTER — Other Ambulatory Visit: Payer: Self-pay | Admitting: Cardiology

## 2011-09-20 ENCOUNTER — Other Ambulatory Visit: Payer: Self-pay | Admitting: Cardiology

## 2011-10-03 ENCOUNTER — Ambulatory Visit (INDEPENDENT_AMBULATORY_CARE_PROVIDER_SITE_OTHER): Payer: Medicare Other | Admitting: Pulmonary Disease

## 2011-10-03 ENCOUNTER — Other Ambulatory Visit (INDEPENDENT_AMBULATORY_CARE_PROVIDER_SITE_OTHER): Payer: Medicare Other

## 2011-10-03 ENCOUNTER — Ambulatory Visit (INDEPENDENT_AMBULATORY_CARE_PROVIDER_SITE_OTHER)
Admission: RE | Admit: 2011-10-03 | Discharge: 2011-10-03 | Disposition: A | Discharge status: Home / Self Care | Payer: Medicare Other | Source: Ambulatory Visit | Attending: Pulmonary Disease | Admitting: Pulmonary Disease

## 2011-10-03 ENCOUNTER — Encounter: Payer: Self-pay | Admitting: Pulmonary Disease

## 2011-10-03 VITALS — BP 126/80 | HR 56 | Temp 97.6°F | Ht 73.0 in | Wt 220.8 lb

## 2011-10-03 DIAGNOSIS — Z8546 Personal history of malignant neoplasm of prostate: Secondary | ICD-10-CM

## 2011-10-03 DIAGNOSIS — K5731 Diverticulosis of large intestine without perforation or abscess with bleeding: Secondary | ICD-10-CM

## 2011-10-03 DIAGNOSIS — H4011X0 Primary open-angle glaucoma, stage unspecified: Secondary | ICD-10-CM | POA: Diagnosis not present

## 2011-10-03 DIAGNOSIS — I10 Essential (primary) hypertension: Secondary | ICD-10-CM | POA: Diagnosis not present

## 2011-10-03 DIAGNOSIS — I442 Atrioventricular block, complete: Secondary | ICD-10-CM

## 2011-10-03 DIAGNOSIS — M109 Gout, unspecified: Secondary | ICD-10-CM | POA: Diagnosis not present

## 2011-10-03 DIAGNOSIS — J209 Acute bronchitis, unspecified: Secondary | ICD-10-CM | POA: Diagnosis not present

## 2011-10-03 DIAGNOSIS — K8020 Calculus of gallbladder without cholecystitis without obstruction: Secondary | ICD-10-CM

## 2011-10-03 DIAGNOSIS — M1990 Unspecified osteoarthritis, unspecified site: Secondary | ICD-10-CM

## 2011-10-03 DIAGNOSIS — I2510 Atherosclerotic heart disease of native coronary artery without angina pectoris: Secondary | ICD-10-CM

## 2011-10-03 DIAGNOSIS — K4090 Unilateral inguinal hernia, without obstruction or gangrene, not specified as recurrent: Secondary | ICD-10-CM

## 2011-10-03 DIAGNOSIS — Z0000 Encounter for general adult medical examination without abnormal findings: Secondary | ICD-10-CM | POA: Diagnosis not present

## 2011-10-03 DIAGNOSIS — Z950 Presence of cardiac pacemaker: Secondary | ICD-10-CM

## 2011-10-03 DIAGNOSIS — H409 Unspecified glaucoma: Secondary | ICD-10-CM | POA: Diagnosis not present

## 2011-10-03 LAB — URIC ACID: Uric Acid, Serum: 5.8 mg/dL (ref 4.0–7.8)

## 2011-10-03 NOTE — Progress Notes (Signed)
Subjective:    Patient ID: Edward Harvey, male    DOB: 02/22/1916, 78 y.o.   MRN: [id number]  HPI 78 y/o BM here for a follow up visit... he has multiple medical problems including HBP, CAD, & complete heart block w/ pacer followed by DrWall & DrKlein;  hx of GI bleed from divertics w/ subtotal colectomy in 2003 & followed by DrPatterson;  hx SBO, inguinal hernias, gallstones;  hx prostate cancer followed by DrOttelin;  DJD & Gout;  hx subdural hematoma prev requiring burr hole, & mild senile dementia...  ~  September 20, 2010:  Here w/ his son for an add-on appt to [facility]> pt & his wife still live indepedently but are having increased difficulty according to son;  They are applying for [facility] benefits wanting someone to come into the home to essentially clean & cook for them> the forms were completed w/ the help of pt & his son;  Medically [name] is stable> HOH w/o change; Breathing stable & chest clear; Denies CP/ palpit/ ch in DOE, edema, etc; GI is stable as well, and CC= DJD w/ difficulty ambulating etc...  ~  January 14, 2011:  6mo ROV & doing satis w/o new complaints or concerns; we reviewed his current meds & prev Labs & XRays...     AB> no recent exac, breathing stable w/o cough, sputum, dyspnea...    HBP, CAD, AVBlock, Pacer> he denies CP, palpit, ch in SOB, edema, etc; BP= 110/68 & tolerating meds well...    Divertics, Hx SBO, Ing Hernias, Gallstones> he continues to do well w/o Abd/GI symptoms...    Prostate Ca> followed by DrOttelin & stable "just slow" he says...    DJD, Gout> ambulates w/ cane & doing ok by his review...  ~  June 03, 2011:  6mo ROV & he says "getting older & slower" but notes that he & his wife will celebrate their 76nd Christian Mate this coming yr...  AB> no recent URI or breathing prob reported...  HBP> on ASA81, Coreg6.25Bid, Quinapril20Bid, Lasix20; wt is down 5# on diet to 223#; BP= 116/78...  CAD/ AVblock/ Pacer> on meds  above; he denies CP, palpit, ch in dyspnea or edema...  Divertics w/ hx hemorrhage/ Hx SBO> he denies abd pain, N/V, D/C, ch in bowels or blood seen...  Hx Prostate Cancer> followed by DrOttelin but we don't have recent notes...  DJD/ Gout> on Allopurinol300, OTC analgesics prn & MVI/ Vit D supplement;   Hx subdural hematoma> required burr holes back then; no known sequellae & he hasn't been falling...  ~  Oct 03, 2011:  6mo ROV & [name] is here w/ his son today- his wife passed away 09-09-22;  He appears to be doing ok- "just older & slower" as he's said before; BP controlled, breathing is ok, denies CP etc;  See prob list below>> CXR 5/13 showed stable heart size & pacer, sl hyperinflation, clear lungs, DJD spine, NAD... LABS 5/13:  Chems- ok w/ BUN=25 Creat=1.4 Uric=5.8 on Allopurinol...        Problem List:    GLAUCOMA (ICD-365.9) - prev followed by Deland Pretty w/ bilat cataract surg & glaucoma on eye 3 diff drops as noted...  HEARING LOSS - son says he has 3 sets of hearing aides but won't wear any of them...  Hx of ASTHMATIC BRONCHITIS, ACUTE (ICD-466.0) - he has never smoked... hx recurrent bronchitic infections over the yrs w/ reactive airway component... no recent prob,  and no regular meds required.   HYPERTENSION (ICD-401.9) - on ASA 81mg /d, COREG 6.25Bid, QUINAPRIL 20mg Bid, LASIX 20mg /d... He takes his own meds & states no problems. ~  4/12:  BP= 110/74 & feeling well... he denies HA, visual changes, CP, palipit, dizziness, syncope, change in dyspnea, etc... ~  8/12:  BP= 110/68 & continues stable... ~  12/12:  BP= 116/78 & he remains asymptomatic... ~  5/13:  BP= 126/80 & he continues to deny CP, palpit, SOB, edema, etc...  CORONARY ARTERY DISEASE (ICD-414.00) - followed by DrWall for Cardiology on above meds... pt has not had a prev cath... ~  2DEcho 4/05 showed mild conc LVH and norm LVF- improved from 2002 when EF= 45%... ~  NuclearStressTest 10/06 showed prior inferoseptal &  apical infarct w/ apical AK, w/o ischemia, EF= 48%... no change from prev.  Hx of AV BLOCK, COMPLETE (ICD-426.0), & CARDIAC PACEMAKER IN SITU (ICD-V45.01) - he had a pacemaker placed for complete heart block initially in 1994, & this was exchanged for a new dual chamber pacer: Valene Bors DDDR model [id number] by [facility] 6/06... ~  seen by DrKlein 1/10 w/ pacer check OK, no further episodes of AFib on his monitor... not a Coumadin candidate due to age, unsteady, hx of GI bleeds, and prev subdural hematoma. ~  followed regularly by DrKlein's pacer clinic (last note 10/12 reviewed)> stable, doing well, no changes made.  DIVERTICULOSIS OF COLON WITH HEMORRHAGE (ICD-562.12)  GASTROINTESTINAL HEMORRHAGE (ICD-578.9) - he had a subtotal colectomy w/ ileum to distal sigmoid anastomosis in Jul03 by DrNewman... ~  EGD 8/03 by Dorris Singh was normal... ~  colonoscopy 1/08 by DrPatterson showed inflammed mucosa in sm bowel prox to the ileo-colonic anastomosis, few divertics in remaining distal colon, no polyps etc...  ~  f/u colonoscopy 5/09 showed prev colectomy- anastomosis granular & bleeding ?Crohn's... also had divertics & hems... Rx'd w/ Lialda.  Hx of SMALL BOWEL OBSTRUCTION (ICD-560.9) > see [id number] Hospitalization by [facility]...  INGUINAL HERNIA (ICD-550.90) - he has bilat fat containing inguinal hernias seen on CT Abd in 2008...   GALLSTONES (ICD-574.20) - Sonar 4/11 showed mult gallstones filling the gallbladder, and CT Abd 4/11 showed mild extrahep biliary ductal dilatation... he was evaluated by DrPatterson/ GI, DrNewman/ Surg, DrWall/ Cards> all agreed that risk was hi & to hold off on surg unless absolutely necessary... ~  4/12 & 8/12:  He remains asymptomatic w/o abd pain, N/V, etc...  RENAL INSUFFICIENCY >> Creat in the 1.4 - 1.5 range... CARCINOMA, PROSTATE, HX OF (ICD-V10.46) - diagnosed in 1993 & treated w/ XRT... PSA initially ~29 and dropped to ~1 after XRT & it has remained there ever since...  followed by DrOttelin- also has BPH, min obstructive symptoms, bilat hydroceles & some benign renal cysts... ~  labs 3/07 by Urology showed PSA= 1.19 ~  labs here 1/10 showed PSA= 1.37 ~  5/11: he reports f/u DrOttelin w/ incontinence symptoms Rx'd Sanctura XR 60mg /d & improved, so he stopped this on his own & states that he is doing satis now... ~  He continues to f/u w/ DrOttelin regularly- we do not have recent notes from Urology...  DEGENERATIVE JOINT DISEASE (ICD-715.90) >> Hx of GOUT (ICD-274.9) >> he has hx of DJD and Gout treated w/ TRAMADOL Prn & ALLOPURINOL 300mg /d; also takes MVI & VIT D 1000 u daily... ~  Labs 5/13 showed Uric= 5.8  HEMATOMA, SUBDURAL (ICD-432.1) - he had a burr hole placed for subdural hematoma in the past..Marland Kitchen  Past Surgical History  Procedure Date  . Pacemaker placement   . Cataract extraction, bilateral   . Tonsillectomy age 66  . Subtotal colectomy 12/03    Dr. Ezzard Standing  . Lipoma excision 2001    left leg    Outpatient Encounter Prescriptions as of 10/03/2011  Medication Sig Dispense Refill  . allopurinol (ZYLOPRIM) 300 MG tablet TAKE 1 TABLET BY MOUTH ONCE A DAY  90 tablet  3  . aspirin 81 MG chewable tablet Chew 81 mg by mouth daily.        . brimonidine (ALPHAGAN P) 0.1 % SOLN Apply 1 drop to eye 3 (three) times daily.        . carvedilol (COREG) 6.25 MG tablet TAKE ONE TABLET BY MOUTH TWICE A DAY  180 tablet  2  . Cholecalciferol (VITAMIN D) 1000 UNITS capsule Take 1,000 Units by mouth daily.        . dorzolamide-timolol (COSOPT) 22.3-6.8 MG/ML ophthalmic solution Place 1 drop into both eyes 2 (two) times daily.        . furosemide (LASIX) 20 MG tablet TAKE 1 TABLET BY MOUTH EVERY DAY  90 tablet  3  . latanoprost (XALATAN) 0.005 % ophthalmic solution Place 1 drop into both eyes at bedtime.        . Multiple Vitamins-Minerals (CENTRUM SILVER PO) Take 1 tablet by mouth daily.        . quinapril (ACCUPRIL) 20 MG tablet TAKE 1 TABLET BY MOUTH TWICE A  DAY  180 tablet  2    No Known Allergies   Current Medications, Allergies, Past Medical History, Past Surgical History, Family History, and Social History were reviewed in [employer] record.    Review of Systems        See HPI - all other systems neg except as noted... The patient complains of dyspnea on exertion, peripheral edema, muscle weakness, and difficulty walking.  The patient denies anorexia, fever, weight loss, weight gain, hoarseness, chest pain, syncope, prolonged cough, headaches, hemoptysis, abdominal pain, melena, hematochezia, severe indigestion/heartburn, hematuria, incontinence, suspicious skin lesions, transient blindness, depression, unusual weight change, abnormal bleeding, enlarged lymph nodes, and angioedema.     Objective:   Physical Exam     WD, overweight, 78 y/o BM in NAD...  GENERAL:  Alert, pleasant & cooperative;  VS- reviewed... HEENT:  [place]/AT, EOM- sl strabismus, PERRLA, EACs-clear (HOH), TMs-wnl, NOSE-clear discharge , THROAT-clear & wnl. NECK:  Supple w/ decrROM; no JVD; normal carotid impulses w/o bruits; no thyromegaly or nodules palpated; no lymphadenopathy. CHEST:  Clear to P & A; without wheezes/ rales/ or rhonchi heard;  Pacer palp in right chest... HEART:  Regular Rhythm; without murmurs/ rubs/ or gallops detected... ABDOMEN:  Scar of prev surg, soft & nontender; normal bowel sounds; no organomegaly or masses palpated...  EXT:  Mod-severe arthritic changes; no varicose veins/ +venous insuffic/ tr edema. NEURO:  CN's intact; motor testing normal; sensory testing inconsistant; gait is abnormal & balance only fair... DERM:  No lesions noted; no rash etc...  RADIOLOGY DATA:  Reviewed in the EPIC EMR & discussed w/ the patient...  LABORATORY DATA:  Reviewed in the EPIC EMR & discussed w/ the patient...   Assessment & Plan:   HBP>  BP stable, continue same meds...  CAD, PACER>  He denies angina, palpit, syncope, etc;   Continue cardiology f/u w/ DrTomWall & DrKlein.  Divertics>  He has hx GI hemorrhage in past;  Stable no abd pain, etc...  Gallstones>  He remains asymptomatic...  Prostate Ca>  Followed by DrOttelin & stable...  DJD>  This is his main issue w/ mobility, self care, remaining independent...  Hx Subdural Hematoma>  Stable w/o acute problems...   Patient's Medications  New Prescriptions   No medications on file  Previous Medications   ALLOPURINOL (ZYLOPRIM) 300 MG TABLET    TAKE 1 TABLET BY MOUTH ONCE A DAY   ASPIRIN 81 MG CHEWABLE TABLET    Chew 81 mg by mouth daily.     BRIMONIDINE (ALPHAGAN P) 0.1 % SOLN    Apply 1 drop to eye 3 (three) times daily.     CARVEDILOL (COREG) 6.25 MG TABLET    TAKE ONE TABLET BY MOUTH TWICE A DAY   CHOLECALCIFEROL (VITAMIN D) 1000 UNITS CAPSULE    Take 1,000 Units by mouth daily.     DORZOLAMIDE-TIMOLOL (COSOPT) 22.3-6.8 MG/ML OPHTHALMIC SOLUTION    Place 1 drop into both eyes 2 (two) times daily.     FUROSEMIDE (LASIX) 20 MG TABLET    TAKE 1 TABLET BY MOUTH EVERY DAY   LATANOPROST (XALATAN) 0.005 % OPHTHALMIC SOLUTION    Place 1 drop into both eyes at bedtime.     MULTIPLE VITAMINS-MINERALS (CENTRUM SILVER PO)    Take 1 tablet by mouth daily.     QUINAPRIL (ACCUPRIL) 20 MG TABLET    TAKE 1 TABLET BY MOUTH TWICE A DAY  Modified Medications   No medications on file  Discontinued Medications   No medications on file

## 2011-10-03 NOTE — Patient Instructions (Signed)
Today we updated your med list in our EPIC system...    Continue your current medications the same...  Today we did your follow up CXR & blood work...    We will call you w/ the results in a few days...  Stay as active as possible, but be careful- no falling allowed!!!  Call for any questions...  Let's plan another follow up visit in 4-6 months.Marland KitchenMarland Kitchen

## 2011-10-04 LAB — BASIC METABOLIC PANEL
CO2: 24 mEq/L (ref 19–32)
Calcium: 9.2 mg/dL (ref 8.4–10.5)
Chloride: 106 mEq/L (ref 96–112)
Glucose, Bld: 85 mg/dL (ref 70–99)
Potassium: 4.4 mEq/L (ref 3.5–5.1)
Sodium: 141 mEq/L (ref 135–145)

## 2011-10-13 ENCOUNTER — Encounter: Payer: Self-pay | Admitting: Pulmonary Disease

## 2011-11-26 DIAGNOSIS — R350 Frequency of micturition: Secondary | ICD-10-CM | POA: Diagnosis not present

## 2012-01-06 DIAGNOSIS — B351 Tinea unguium: Secondary | ICD-10-CM | POA: Diagnosis not present

## 2012-01-06 DIAGNOSIS — M79609 Pain in unspecified limb: Secondary | ICD-10-CM | POA: Diagnosis not present

## 2012-01-16 DIAGNOSIS — H4011X0 Primary open-angle glaucoma, stage unspecified: Secondary | ICD-10-CM | POA: Diagnosis not present

## 2012-01-16 DIAGNOSIS — H409 Unspecified glaucoma: Secondary | ICD-10-CM | POA: Diagnosis not present

## 2012-02-05 ENCOUNTER — Encounter: Payer: Self-pay | Admitting: Pulmonary Disease

## 2012-02-05 ENCOUNTER — Ambulatory Visit (INDEPENDENT_AMBULATORY_CARE_PROVIDER_SITE_OTHER): Payer: Medicare Other | Admitting: Pulmonary Disease

## 2012-02-05 VITALS — BP 126/78 | HR 64 | Temp 97.0°F | Ht 73.0 in | Wt 216.2 lb

## 2012-02-05 DIAGNOSIS — K5731 Diverticulosis of large intestine without perforation or abscess with bleeding: Secondary | ICD-10-CM

## 2012-02-05 DIAGNOSIS — M1990 Unspecified osteoarthritis, unspecified site: Secondary | ICD-10-CM

## 2012-02-05 DIAGNOSIS — I442 Atrioventricular block, complete: Secondary | ICD-10-CM

## 2012-02-05 DIAGNOSIS — I2510 Atherosclerotic heart disease of native coronary artery without angina pectoris: Secondary | ICD-10-CM | POA: Diagnosis not present

## 2012-02-05 DIAGNOSIS — I10 Essential (primary) hypertension: Secondary | ICD-10-CM

## 2012-02-05 DIAGNOSIS — Z950 Presence of cardiac pacemaker: Secondary | ICD-10-CM

## 2012-02-05 DIAGNOSIS — M109 Gout, unspecified: Secondary | ICD-10-CM

## 2012-02-05 DIAGNOSIS — J209 Acute bronchitis, unspecified: Secondary | ICD-10-CM | POA: Diagnosis not present

## 2012-02-05 DIAGNOSIS — Z8546 Personal history of malignant neoplasm of prostate: Secondary | ICD-10-CM

## 2012-02-05 MED ORDER — TAMSULOSIN HCL 0.4 MG PO CAPS: 0.4000 mg | ORAL_CAPSULE | Freq: Every day | ORAL | Status: DC

## 2012-02-05 NOTE — Progress Notes (Signed)
Subjective:    Patient ID: Edward Harvey, male    DOB: Apr 15, 1916, 78 y.o.   MRN: [id number]  HPI 78 y/o BM here for a follow up visit... he has multiple medical problems including HBP, CAD, & complete heart block w/ pacer followed by DrWall & DrKlein;  hx of GI bleed from divertics w/ subtotal colectomy in 2003 & followed by DrPatterson;  hx SBO, inguinal hernias, gallstones;  hx prostate cancer followed by DrOttelin;  DJD & Gout;  hx subdural hematoma prev requiring burr hole, & mild senile dementia...  ~  September 20, 2010:  Here w/ his son for an add-on appt to [facility]> pt & his wife still live indepedently but are having increased difficulty according to son;  They are applying for [facility] [employer] [facility] benefits wanting someone to come into the home to essentially clean & cook for them> the forms were completed w/ the help of pt & his son;  Medically Edward Harvey is stable> HOH w/o change; Breathing stable & chest clear; Denies CP/ palpit/ ch in DOE, edema, etc; GI is stable as well, and CC= DJD w/ difficulty ambulating etc...  ~  January 14, 2011:  539mo ROV & doing satis w/o new complaints or concerns; we reviewed his current meds & prev Labs & XRays...     AB> no recent exac, breathing stable w/o cough, sputum, dyspnea...    HBP, CAD, AVBlock, Pacer> he denies CP, palpit, ch in SOB, edema, etc; BP= 110/68 & tolerating meds well...    Divertics, Hx SBO, Ing Hernias, Gallstones> he continues to do well w/o Abd/GI symptoms...    Prostate Ca> followed by DrOttelin & stable "just slow" he says...    DJD, Gout> ambulates w/ cane & doing ok by his review...  ~  June 03, 2011:  539mo ROV & he says "getting older & slower" but notes that he & his wife will celebrate their 72nd Christian Mate this coming yr...  AB> no recent URI or breathing prob reported...  HBP> on ASA81, Coreg6.25Bid, Quinapril20Bid, Lasix20; wt is down 5# on diet to 223#; BP= 116/78...  CAD/ AVblock/ Pacer> on meds  above; he denies CP, palpit, ch in dyspnea or edema...  Divertics w/ hx hemorrhage/ Hx SBO> he denies abd pain, N/V, D/C, ch in bowels or blood seen...  Hx Prostate Cancer> followed by DrOttelin but we don't have recent notes...  DJD/ Gout> on Allopurinol300, OTC analgesics prn & MVI/ Vit D supplement;   Hx subdural hematoma> required burr holes back then; no known sequellae & he hasn't been falling...  ~  Oct 03, 2011:  539mo ROV & Edward Harvey is here w/ his son today- his wife passed away 15-Aug-2022;  He appears to be doing ok- "just older & slower" as he's said before; BP controlled, breathing is ok, denies CP etc;  See prob list below>> CXR 5/13 showed stable heart size & pacer, sl hyperinflation, clear lungs, DJD spine, NAD... LABS 5/13:  Chems- ok w/ BUN=25 Creat=1.4 Uric=5.8 on Allopurinol...  ~  February 05, 2012:  539mo ROV & Edward Harvey denies any problems other than "getting older"; he notes the vision in his left eye is getting worse, on 3 diff eye drops, & he stopped driving 4/09 (Dr Nile Riggs sent him to [facility] for 2nd opinion);  BP is well controlled on Coreg, Quinapril, & Lasix;  He denies CP, palpit, ch in SOB or edema;  GI is ok w/o recent problems, but his  urine in slower & we discussed trial FLOMAX 0.4mg  Qhs;  Overall he is quite remarkable for 78 y/o... We reviewed prob list, meds, xrays and labs> see below for updates >>         Problem List:    GLAUCOMA (ICD-365.9) - prev followed by Deland Pretty w/ bilat cataract surg & glaucoma on eye 3 diff drops as noted...  HEARING LOSS - son says he has 3 sets of hearing aides but won't wear any of them...  Hx of ASTHMATIC BRONCHITIS, ACUTE (ICD-466.0) - he has never smoked... hx recurrent bronchitic infections over the yrs w/ reactive airway component... no recent prob, and no regular meds required.   HYPERTENSION (ICD-401.9) - on ASA 81mg /d, COREG 6.25Bid, QUINAPRIL 20mg Bid, LASIX 20mg /d... He takes his own meds & states no problems. ~  4/12:  BP= 110/74  & feeling well... he denies HA, visual changes, CP, palipit, dizziness, syncope, change in dyspnea, etc... ~  8/12:  BP= 110/68 & continues stable... ~  12/12:  BP= 116/78 & he remains asymptomatic... ~  5/13:  BP= 126/80 & he continues to deny CP, palpit, SOB, edema, etc... ~  9/13:  BP= 126/78 & he denies CP, palpit, ch in SOB or edema...  CORONARY ARTERY DISEASE (ICD-414.00) - followed by DrWall for Cardiology on above meds... pt has not had a prev cath... ~  2DEcho 4/05 showed mild conc LVH and norm LVF- improved from 2002 when EF= 45%... ~  NuclearStressTest 10/06 showed prior inferoseptal & apical infarct w/ apical AK, w/o ischemia, EF= 48%... no change from prev.  Hx of AV BLOCK, COMPLETE (ICD-426.0), & CARDIAC PACEMAKER IN SITU (ICD-V45.01) - he had a pacemaker placed for complete heart block initially in 1994, & this was exchanged for a new dual chamber pacer: Valene Bors DDDR model [id number] by [facility] 6/06... ~  seen by DrKlein 1/10 w/ pacer check OK, no further episodes of AFib on his monitor... not a Coumadin candidate due to age, unsteady, hx of GI bleeds, and prev subdural hematoma. ~  followed regularly by DrKlein's pacer clinic (last note 10/12 reviewed)> stable, doing well, no changes made.  DIVERTICULOSIS OF COLON WITH HEMORRHAGE (ICD-562.12)  GASTROINTESTINAL HEMORRHAGE (ICD-578.9) - he had a subtotal colectomy w/ ileum to distal sigmoid anastomosis in Jul03 by DrNewman... ~  EGD 8/03 by Dorris Singh was normal... ~  colonoscopy 1/08 by DrPatterson showed inflammed mucosa in sm bowel prox to the ileo-colonic anastomosis, few divertics in remaining distal colon, no polyps etc...  ~  f/u colonoscopy 5/09 showed prev colectomy- anastomosis granular & bleeding ?Crohn's... also had divertics & hems... Rx'd w/ Lialda.  Hx of SMALL BOWEL OBSTRUCTION (ICD-560.9) > see [id number] Hospitalization by [facility]...  INGUINAL HERNIA (ICD-550.90) - he has bilat fat containing inguinal hernias seen on CT Abd  in 2008...   GALLSTONES (ICD-574.20) - Sonar 4/11 showed mult gallstones filling the gallbladder, and CT Abd 4/11 showed mild extrahep biliary ductal dilatation... he was evaluated by DrPatterson/ GI, DrNewman/ Surg, DrWall/ Cards> all agreed that risk was hi & to hold off on surg unless absolutely necessary... ~  4/12 & 8/12:  He remains asymptomatic w/o abd pain, N/V, etc...  RENAL INSUFFICIENCY >> Creat in the 1.4 - 1.5 range... CARCINOMA, PROSTATE, HX OF (ICD-V10.46) - diagnosed in 1993 & treated w/ XRT... PSA initially ~29 and dropped to ~1 after XRT & it has remained there ever since... followed by DrOttelin- also has BPH, min obstructive symptoms, bilat hydroceles & some benign renal cysts... ~  labs 3/07 by Urology showed PSA= 1.19 ~  labs here 1/10 showed PSA= 1.37 ~  5/11: he reports f/u DrOttelin w/ incontinence symptoms Rx'd Sanctura XR 60mg /d & improved, so he stopped this on his own & states that he is doing satis now... ~  He continues to f/u w/ DrOttelin regularly- we do not have recent notes from Urology... ~  9/13:  He notes slower urinary stream & we decided to try [facility] 0.4mg Qhs...  DEGENERATIVE JOINT DISEASE (ICD-715.90) >> Hx of GOUT (ICD-274.9) >> he has hx of DJD and Gout treated w/ TRAMADOL Prn & ALLOPURINOL 300mg /d; also takes MVI & VIT D 1000 u daily... ~  Labs 5/13 showed Uric= 5.8  HEMATOMA, SUBDURAL (ICD-432.1) - he had a burr hole placed for subdural hematoma in the past...    Past Surgical History  Procedure Date  . Pacemaker placement   . Cataract extraction, bilateral   . Tonsillectomy age 89  . Subtotal colectomy 12/03    Dr. Ezzard Standing  . Lipoma excision 2001    left leg    Outpatient Encounter Prescriptions as of 02/05/2012  Medication Sig Dispense Refill  . allopurinol (ZYLOPRIM) 300 MG tablet TAKE 1 TABLET BY MOUTH ONCE A DAY  90 tablet  3  . aspirin 81 MG chewable tablet Chew 81 mg by mouth daily.        . brimonidine (ALPHAGAN P) 0.1 % SOLN Apply  1 drop to eye 3 (three) times daily.        . carvedilol (COREG) 6.25 MG tablet TAKE ONE TABLET BY MOUTH TWICE A DAY  180 tablet  2  . Cholecalciferol (VITAMIN D) 1000 UNITS capsule Take 1,000 Units by mouth daily.        . dorzolamide-timolol (COSOPT) 22.3-6.8 MG/ML ophthalmic solution Place 1 drop into both eyes 2 (two) times daily.        . furosemide (LASIX) 20 MG tablet TAKE 1 TABLET BY MOUTH EVERY DAY  90 tablet  3  . latanoprost (XALATAN) 0.005 % ophthalmic solution Place 1 drop into both eyes at bedtime.        . Multiple Vitamins-Minerals (CENTRUM SILVER PO) Take 1 tablet by mouth daily.        . quinapril (ACCUPRIL) 20 MG tablet TAKE 1 TABLET BY MOUTH TWICE A DAY  180 tablet  2    No Known Allergies   Current Medications, Allergies, Past Medical History, Past Surgical History, Family History, and Social History were reviewed in [employer] record.    Review of Systems        See HPI - all other systems neg except as noted... The patient complains of dyspnea on exertion, peripheral edema, muscle weakness, and difficulty walking.  The patient denies anorexia, fever, weight loss, weight gain, hoarseness, chest pain, syncope, prolonged cough, headaches, hemoptysis, abdominal pain, melena, hematochezia, severe indigestion/heartburn, hematuria, incontinence, suspicious skin lesions, transient blindness, depression, unusual weight change, abnormal bleeding, enlarged lymph nodes, and angioedema.     Objective:   Physical Exam     WD, overweight, 78 y/o BM in NAD...  GENERAL:  Alert, pleasant & cooperative;  VS- reviewed... HEENT:  [place]/AT, EOM- sl strabismus, PERRLA, EACs-clear (HOH), TMs-wnl, NOSE-clear discharge , THROAT-clear & wnl. NECK:  Supple w/ decrROM; no JVD; normal carotid impulses w/o bruits; no thyromegaly or nodules palpated; no lymphadenopathy. CHEST:  Clear to P & A; without wheezes/ rales/ or rhonchi heard;  Pacer palp in right chest... HEART:   Regular  Rhythm; without murmurs/ rubs/ or gallops detected... ABDOMEN:  Scar of prev surg, soft & nontender; normal bowel sounds; no organomegaly or masses palpated...  EXT:  Mod-severe arthritic changes; no varicose veins/ +venous insuffic/ tr edema. NEURO:  CN's intact; motor testing normal; sensory testing inconsistant; gait is abnormal & balance only fair... DERM:  No lesions noted; no rash etc...  RADIOLOGY DATA:  Reviewed in the EPIC EMR & discussed w/ the patient...  LABORATORY DATA:  Reviewed in the EPIC EMR & discussed w/ the patient...   Assessment & Plan:   HBP>  BP stable, continue same meds...  CAD, PACER>  He denies angina, palpit, syncope, etc;  Continue cardiology f/u w/ DrTomWall & DrKlein.  Divertics>  He has hx GI hemorrhage in past;  Stable no abd pain, etc...  Gallstones>  He remains asymptomatic...  Prostate Ca>  Followed by DrOttelin & stable...  DJD>  This is his main issue w/ mobility, self care, remaining independent...  Hx Subdural Hematoma>  Stable w/o acute problems...   Patient's Medications  New Prescriptions   TAMSULOSIN HCL (FLOMAX) 0.4 MG CAPS    Take 1 capsule (0.4 mg total) by mouth daily after supper.  Previous Medications   ALLOPURINOL (ZYLOPRIM) 300 MG TABLET    TAKE 1 TABLET BY MOUTH ONCE A DAY   ASPIRIN 81 MG CHEWABLE TABLET    Chew 81 mg by mouth daily.     BRIMONIDINE (ALPHAGAN P) 0.1 % SOLN    Apply 1 drop to eye 3 (three) times daily.     CARVEDILOL (COREG) 6.25 MG TABLET    TAKE ONE TABLET BY MOUTH TWICE A DAY   CHOLECALCIFEROL (VITAMIN D) 1000 UNITS CAPSULE    Take 1,000 Units by mouth daily.     DORZOLAMIDE-TIMOLOL (COSOPT) 22.3-6.8 MG/ML OPHTHALMIC SOLUTION    Place 1 drop into both eyes 2 (two) times daily.     FUROSEMIDE (LASIX) 20 MG TABLET    TAKE 1 TABLET BY MOUTH EVERY DAY   LATANOPROST (XALATAN) 0.005 % OPHTHALMIC SOLUTION    Place 1 drop into both eyes at bedtime.     MULTIPLE VITAMINS-MINERALS (CENTRUM SILVER PO)     Take 1 tablet by mouth daily.     QUINAPRIL (ACCUPRIL) 20 MG TABLET    TAKE 1 TABLET BY MOUTH TWICE A DAY  Modified Medications   No medications on file  Discontinued Medications   No medications on file

## 2012-02-05 NOTE — Patient Instructions (Addendum)
Today we updated your med list in our EPIC system...    Continue your current medications the same...  We decided to add TAMSULOSIN 0.4mg  one tab daily to help your urine flow...  Stay as active as possible, & be careful (no falling allowed!)  Call for any problems...  Let's plan to recheck you in 4 months.Marland KitchenMarland Kitchen

## 2012-02-17 DIAGNOSIS — H409 Unspecified glaucoma: Secondary | ICD-10-CM | POA: Diagnosis not present

## 2012-02-17 DIAGNOSIS — H4011X0 Primary open-angle glaucoma, stage unspecified: Secondary | ICD-10-CM | POA: Diagnosis not present

## 2012-02-19 ENCOUNTER — Other Ambulatory Visit: Payer: Self-pay | Admitting: Pulmonary Disease

## 2012-02-25 ENCOUNTER — Telehealth: Payer: Self-pay | Admitting: Pulmonary Disease

## 2012-02-25 NOTE — Telephone Encounter (Signed)
Spoke with pt's daughter. She states that pt had stomach surgery "years ago" and that SN advised the pt that in the future he may have some "problems from this surgery". The pt is c/o hoarseness and bad taste in mouth. Req appt with SN. SN has no appts open, so scheduled appt with TP for tomorrow at 9:45 am. Daughter states nothing further needed.

## 2012-02-26 ENCOUNTER — Encounter: Payer: Self-pay | Admitting: Adult Health

## 2012-02-26 ENCOUNTER — Ambulatory Visit (INDEPENDENT_AMBULATORY_CARE_PROVIDER_SITE_OTHER): Payer: Medicare Other | Admitting: Adult Health

## 2012-02-26 VITALS — BP 122/70 | HR 61 | Temp 97.0°F | Ht 72.0 in | Wt 216.4 lb

## 2012-02-26 DIAGNOSIS — M1990 Unspecified osteoarthritis, unspecified site: Secondary | ICD-10-CM | POA: Diagnosis not present

## 2012-02-26 DIAGNOSIS — J310 Chronic rhinitis: Secondary | ICD-10-CM | POA: Diagnosis not present

## 2012-02-26 NOTE — Progress Notes (Signed)
Subjective:    Patient ID: Edward Harvey, male    DOB: 25-Jun-1915, 78 y.o.   MRN: [id number]  HPI 78 y/o BM  he has multiple medical problems including HBP, CAD, & complete heart block w/ pacer followed by DrWall & DrKlein;  hx of GI bleed from divertics w/ subtotal colectomy in 2003 & followed by DrPatterson;  hx SBO, inguinal hernias, gallstones;  hx prostate cancer followed by DrOttelin;  DJD & Gout;  hx subdural hematoma prev requiring burr hole, & mild senile dementia...  ~  September 20, 2010:  Here w/ his son for an add-on appt to complete some papers for the [facility]> Edward Harvey & his wife still live indepedently but are having increased difficulty according to son;  They are applying for [employer] Aid [employer]end[employer] benefits wanting someone to come into the home to essentially clean & cook for them> the forms were completed w/ the help of Edward Harvey & his son;  Medically Edward Harvey is stable> HOH w/o change; Breathing stable & chest clear; Denies CP/ palpit/ ch in DOE, edema, etc; GI is stable as well, and CC= DJD w/ difficulty ambulating etc...  ~  January 14, 2011:  72mo ROV & doing satis w/o new complaints or concerns; we reviewed his current meds & prev Labs & XRays...     AB> no recent exac, breathing stable w/o cough, sputum, dyspnea...    HBP, CAD, AVBlock, Pacer> he denies CP, palpit, ch in SOB, edema, etc; BP= 110/68 & tolerating meds well...    Divertics, Hx SBO, Ing Hernias, Gallstones> he continues to do well w/o Abd/GI symptoms...    Prostate Ca> followed by DrOttelin & stable "just slow" he says...    DJD, Gout> ambulates w/ cane & doing ok by his review...  ~  June 03, 2011:  72mo ROV & he says "getting older & slower" but notes that he & his wife will celebrate their 72nd Christian Mate this coming yr...  AB> no recent URI or breathing prob reported...  HBP> on ASA81, Coreg6.25Bid, Quinapril20Bid, Lasix20; wt is down 5# on diet to 223#; BP= 116/78...  CAD/ AVblock/ Pacer> on meds above; he denies CP, palpit,  ch in dyspnea or edema...  Divertics w/ hx hemorrhage/ Hx SBO> he denies abd pain, N/V, D/C, ch in bowels or blood seen...  Hx Prostate Cancer> followed by DrOttelin but we don't have recent notes...  DJD/ Gout> on Allopurinol300, OTC analgesics prn & MVI/ Vit D supplement;   Hx subdural hematoma> required burr holes back then; no known sequellae & he hasn't been falling...  ~  Oct 03, 2011:  72mo ROV & Edward Harvey is here w/ his son today- his wife passed away 2022-08-18;  He appears to be doing ok- "just older & slower" as he's said before; BP controlled, breathing is ok, denies CP etc;  See prob list below>> CXR 5/13 showed stable heart size & pacer, sl hyperinflation, clear lungs, DJD spine, NAD... LABS 5/13:  Chems- ok w/ BUN=25 Creat=1.4 Uric=5.8 on Allopurinol...  ~  February 05, 2012:  72mo ROV & Edward Harvey denies any problems other than "getting older"; he notes the vision in his left eye is getting worse, on 3 diff eye drops, & he stopped driving 4/09 (Dr Nile Riggs sent him to [facility] for 2nd opinion);  BP is well controlled on Coreg, Quinapril, & Lasix;  He denies CP, palpit, ch in SOB or edema;  GI is ok w/o recent problems, but his urine in slower & we  discussed trial FLOMAX 0.4mg  Qhs;  Overall he is quite remarkable for 78 y/o... We reviewed prob list, meds, xrays and labs> see below for updates >>  02/26/2012 Acute OV  Complains of hoarseness and bad taste in mouth x1 week .  Has a lot of phelgm in throat . No cough or fever . No discolored mucus .  Has noticed some pain on inspiration along right lower ribs at times.  No dysuria, urinary urgency/freq, hematuria.  " Feels fine overall-just wanted to check these things out "  No chest pain or edema. No dyspnea.          Problem List:    GLAUCOMA (ICD-365.9) - prev followed by Deland Pretty w/ bilat cataract surg & glaucoma on eye 3 diff drops as noted...  HEARING LOSS - son says he has 3 sets of hearing aides but won't wear any of them...  Hx of  ASTHMATIC BRONCHITIS, ACUTE (ICD-466.0) - he has never smoked... hx recurrent bronchitic infections over the yrs w/ reactive airway component... no recent prob, and no regular meds required.   HYPERTENSION (ICD-401.9) - on ASA 81mg /d, COREG 6.25Bid, QUINAPRIL 20mg Bid, LASIX 20mg /d... He takes his own meds & states no problems. ~  4/12:  BP= 110/74 & feeling well... he denies HA, visual changes, CP, palipit, dizziness, syncope, change in dyspnea, etc... ~  8/12:  BP= 110/68 & continues stable... ~  12/12:  BP= 116/78 & he remains asymptomatic... ~  5/13:  BP= 126/80 & he continues to deny CP, palpit, SOB, edema, etc... ~  9/13:  BP= 126/78 & he denies CP, palpit, ch in SOB or edema...  CORONARY ARTERY DISEASE (ICD-414.00) - followed by DrWall for Cardiology on above meds... Edward Harvey has not had a prev cath... ~  2DEcho 4/05 showed mild conc LVH and norm LVF- improved from 2002 when EF= 45%... ~  NuclearStressTest 10/06 showed prior inferoseptal & apical infarct w/ apical AK, w/o ischemia, EF= 48%... no change from prev.  Hx of AV BLOCK, COMPLETE (ICD-426.0), & CARDIAC PACEMAKER IN SITU (ICD-V45.01) - he had a pacemaker placed for complete heart block initially in 1994, & this was exchanged for a new dual chamber pacer: Valene Bors DDDR model [phone] by [facility] 6/06... ~  seen by DrKlein 1/10 w/ pacer check OK, no further episodes of AFib on his monitor... not a Coumadin candidate due to age, unsteady, hx of GI bleeds, and prev subdural hematoma. ~  followed regularly by DrKlein's pacer clinic (last note 10/12 reviewed)> stable, doing well, no changes made.  DIVERTICULOSIS OF COLON WITH HEMORRHAGE (ICD-562.12)  GASTROINTESTINAL HEMORRHAGE (ICD-578.9) - he had a subtotal colectomy w/ ileum to distal sigmoid anastomosis in Jul03 by DrNewman... ~  EGD 8/03 by Dorris Singh was normal... ~  colonoscopy 1/08 by DrPatterson showed inflammed mucosa in sm bowel prox to the ileo-colonic anastomosis, few divertics in  remaining distal colon, no polyps etc...  ~  f/u colonoscopy 5/09 showed prev colectomy- anastomosis granular & bleeding ?Crohn's... also had divertics & hems... Rx'd w/ Lialda.  Hx of SMALL BOWEL OBSTRUCTION (ICD-560.9) > see [id number] Hospitalization by [facility]...  INGUINAL HERNIA (ICD-550.90) - he has bilat fat containing inguinal hernias seen on CT Abd in 2008...   GALLSTONES (ICD-574.20) - Sonar 4/11 showed mult gallstones filling the gallbladder, and CT Abd 4/11 showed mild extrahep biliary ductal dilatation... he was evaluated by DrPatterson/ GI, DrNewman/ Surg, DrWall/ Cards> all agreed that risk was hi & to hold off on surg unless absolutely necessary... ~  4/12 & 8/12:  He remains asymptomatic w/o abd pain, N/V, etc...  RENAL INSUFFICIENCY >> Creat in the 1.4 - 1.5 range... CARCINOMA, PROSTATE, HX OF (ICD-V10.46) - diagnosed in 1993 & treated w/ XRT... PSA initially ~29 and dropped to ~1 after XRT & it has remained there ever since... followed by DrOttelin- also has BPH, min obstructive symptoms, bilat hydroceles & some benign renal cysts... ~  labs 3/07 by Urology showed PSA= 1.19 ~  labs here 1/10 showed PSA= 1.37 ~  5/11: he reports f/u DrOttelin w/ incontinence symptoms Rx'd [place] XR 60mg /d & improved, so he stopped this on his own & states that he is doing satis now... ~  He continues to f/u w/ DrOttelin regularly- we do not have recent notes from Urology... ~  9/13:  He notes slower urinary stream & we decided to try FLOMAX 0.4mg Qhs...  DEGENERATIVE JOINT DISEASE (ICD-715.90) >> Hx of GOUT (ICD-274.9) >> he has hx of DJD and Gout treated w/ TRAMADOL Prn & ALLOPURINOL 300mg /d; also takes MVI & VIT D 1000 u daily... ~  Labs 5/13 showed Uric= 5.8  HEMATOMA, SUBDURAL (ICD-432.1) - he had a burr hole placed for subdural hematoma in the past...    Past Surgical History  Procedure Date  . Pacemaker placement   . Cataract extraction, bilateral   . Tonsillectomy age 6  . Subtotal  colectomy 12/03    Dr. Ezzard Standing  . Lipoma excision 2001    left leg    Outpatient Encounter Prescriptions as of 02/26/2012  Medication Sig Dispense Refill  . allopurinol (ZYLOPRIM) 300 MG tablet TAKE 1 TABLET BY MOUTH ONCE A DAY  90 tablet  3  . aspirin 81 MG chewable tablet Chew 81 mg by mouth daily.        . brimonidine (ALPHAGAN P) 0.1 % SOLN Apply 1 drop to eye 3 (three) times daily.        . carvedilol (COREG) 6.25 MG tablet TAKE ONE TABLET BY MOUTH TWICE A DAY  180 tablet  2  . Cholecalciferol (VITAMIN D) 1000 UNITS capsule Take 1,000 Units by mouth daily.        . dorzolamide-timolol (COSOPT) 22.3-6.8 MG/ML ophthalmic solution Place 1 drop into both eyes 2 (two) times daily.        . furosemide (LASIX) 20 MG tablet TAKE 1 TABLET BY MOUTH EVERY DAY  90 tablet  3  . latanoprost (XALATAN) 0.005 % ophthalmic solution Place 1 drop into both eyes at bedtime.        . Multiple Vitamins-Minerals (CENTRUM SILVER PO) Take 1 tablet by mouth daily.        . quinapril (ACCUPRIL) 20 MG tablet TAKE 1 TABLET BY MOUTH TWICE A DAY  180 tablet  2  . Tamsulosin HCl (FLOMAX) 0.4 MG CAPS Take 1 capsule (0.4 mg total) by mouth daily after supper.  30 capsule  11    No Known Allergies   Current Medications, Allergies, Past Medical History, Past Surgical History, Family History, and Social History were reviewed in [employer] record.    Review of Systems        Constitutional:   No  weight loss, night sweats,  Fevers, chills, fatigue, or  lassitude.  HEENT:   No headaches,  Difficulty swallowing,  Tooth/dental problems, or  Sore throat,                No sneezing, itching, ear ache, nasal congestion,  +post nasal drip,  CV:  No chest pain,  Orthopnea, PND, swelling in lower extremities, anasarca, dizziness, palpitations, syncope.   GI  No heartburn, indigestion, abdominal pain, nausea, vomiting, diarrhea, change in bowel habits, loss of appetite, bloody stools.   Resp: No  shortness of breath with exertion or at rest.  No excess mucus, no productive cough,  No non-productive cough,  No coughing up of blood.  No change in color of mucus.  No wheezing.  No chest Dain deformity  Skin: no rash or lesions.  GU: no dysuria, change in color of urine, no urgency or frequency.  No flank pain, no hematuria   MS:  No joint pain or swelling.    Psych:  No change in mood or affect. No depression or anxiety.  No memory loss.       Objective:   Physical Exam     WD, overweight, 78 y/o BM in NAD...  GENERAL:  Alert, pleasant & cooperative;  VS- reviewed... HEENT:  [place]/AT,   EACs-clear (HOH), TMs-wnl, NOSE-clear discharge , THROAT-clear & wnl., no redness, clear drainage noted NECK:  Supple w/ decrROM; no JVD; normal carotid impulses w/o bruits; no thyromegaly or nodules palpated; no lymphadenopathy. CHEST:  Clear to P & A; without wheezes/ rales/ or rhonchi heard;  Pacer palp in right chest... Along right lateral/post ribs nontender , no eccymosis or deformity noted.  HEART:  Regular Rhythm; without murmurs/ rubs/ or gallops detected... ABDOMEN:  Scar of prev surg, soft & nontender; normal bowel sounds; no organomegaly or masses palpated...  EXT:  Mod-severe arthritic changes; no varicose veins/ +venous insuffic/ tr edema. NEURO:   gait is abnormal & balance only fair... DERM:  No lesions noted; no rash etc...   Assessment & Plan:

## 2012-02-26 NOTE — Assessment & Plan Note (Signed)
Mild flare   Plan   Claritin 5mg  daily As needed  Drainage.  Saline nasal rinses As needed   Please contact office for sooner follow up if symptoms do not improve or worsen or seek emergency care

## 2012-02-26 NOTE — Assessment & Plan Note (Signed)
Right back pain /rib pain vs mild pleurisy Exam is unrevealing  Suspect has some mild pleurisy  cxr 10/2011 was ok , never smoker.   Plan Advil 200mg  Twice daily  For 3 days, take with food.  Warm heat to ribs.   Please contact office for sooner follow up if symptoms do not improve or worsen or seek emergency care  Advised son if not improving will need further eval.

## 2012-02-26 NOTE — Patient Instructions (Addendum)
Advil 200mg  Twice daily  For 3 days, take with food.  Warm heat to ribs.  Claritin 5mg  daily As needed  Drainage.  Saline nasal rinses As needed   Please contact office for sooner follow up if symptoms do not improve or worsen or seek emergency care

## 2012-03-04 ENCOUNTER — Other Ambulatory Visit: Payer: Self-pay | Admitting: Cardiology

## 2012-03-16 ENCOUNTER — Telehealth: Payer: Self-pay | Admitting: Pulmonary Disease

## 2012-03-16 DIAGNOSIS — H409 Unspecified glaucoma: Secondary | ICD-10-CM | POA: Diagnosis not present

## 2012-03-16 DIAGNOSIS — H4011X0 Primary open-angle glaucoma, stage unspecified: Secondary | ICD-10-CM | POA: Diagnosis not present

## 2012-03-16 NOTE — Telephone Encounter (Signed)
I spoke with the pt and he states several years ago he had a surgery and that he remembers Dr. Kriste Basque telling him that he could have some issues from the surgery such as pain in his sides and other side effects. Pt states he has been having some pain on his right and left side. He states the pain comes and goes. Pt could not tell me what the surgery was for but he did mention he thinks  Dr. Jarold Motto performed the surgery. Is it possible he is talking about subtotal colectomy w/ ileum to distal sigmoid anastomosis in Jul03 by Dr Ezzard Standing? Pt states he wanted to discuss this further with Dr. Kriste Basque. Pt states he will come in for OV if needed, he just needs to arrange transportation. Pt states he is not having any pain today. Please advise.Carron Curie, CMA

## 2012-03-16 NOTE — Telephone Encounter (Signed)
Called and spoke with pt and he is aware of appt with SN on Thursday at 3pm.

## 2012-03-16 NOTE — Telephone Encounter (Signed)
Per SN----we would need to discuss and eval---will need ov with SN.  Ok to add pt on to scheduled on Thursday at 3pm.  thanks

## 2012-03-18 ENCOUNTER — Encounter: Payer: Self-pay | Admitting: *Deleted

## 2012-03-19 ENCOUNTER — Ambulatory Visit (INDEPENDENT_AMBULATORY_CARE_PROVIDER_SITE_OTHER): Payer: Medicare Other | Admitting: Pulmonary Disease

## 2012-03-19 ENCOUNTER — Encounter: Payer: Self-pay | Admitting: Pulmonary Disease

## 2012-03-19 VITALS — BP 102/66 | HR 60 | Temp 96.6°F | Ht 73.0 in | Wt 218.0 lb

## 2012-03-19 DIAGNOSIS — J069 Acute upper respiratory infection, unspecified: Secondary | ICD-10-CM

## 2012-03-19 DIAGNOSIS — I2510 Atherosclerotic heart disease of native coronary artery without angina pectoris: Secondary | ICD-10-CM

## 2012-03-19 DIAGNOSIS — I442 Atrioventricular block, complete: Secondary | ICD-10-CM | POA: Diagnosis not present

## 2012-03-19 DIAGNOSIS — K922 Gastrointestinal hemorrhage, unspecified: Secondary | ICD-10-CM

## 2012-03-19 DIAGNOSIS — M1990 Unspecified osteoarthritis, unspecified site: Secondary | ICD-10-CM

## 2012-03-19 DIAGNOSIS — F0390 Unspecified dementia without behavioral disturbance: Secondary | ICD-10-CM | POA: Insufficient documentation

## 2012-03-19 DIAGNOSIS — K8020 Calculus of gallbladder without cholecystitis without obstruction: Secondary | ICD-10-CM

## 2012-03-19 DIAGNOSIS — Z8546 Personal history of malignant neoplasm of prostate: Secondary | ICD-10-CM

## 2012-03-19 DIAGNOSIS — I10 Essential (primary) hypertension: Secondary | ICD-10-CM

## 2012-03-19 DIAGNOSIS — Z950 Presence of cardiac pacemaker: Secondary | ICD-10-CM

## 2012-03-19 DIAGNOSIS — K56609 Unspecified intestinal obstruction, unspecified as to partial versus complete obstruction: Secondary | ICD-10-CM

## 2012-03-19 NOTE — Progress Notes (Signed)
Subjective:    Patient ID: Edward Harvey, male    DOB: 01/18/1916, 78 y.o.   MRN: [id number]  HPI 78 y/o BM here for a follow up visit... he has multiple medical problems including HBP, CAD, & complete heart block w/ pacer followed by Edward Harvey & Edward Harvey;  hx of GI bleed from divertics w/ subtotal colectomy in 2003 & followed by Edward Harvey;  hx SBO, inguinal hernias, gallstones;  hx prostate cancer followed by Edward Harvey;  DJD & Gout;  hx subdural hematoma prev requiring burr hole, & mild senile dementia...  ~  September 20, 2010:  Here w/ his son for an add-on appt to complete some papers for the [facility]> pt & his wife still live indepedently but are having increased difficulty according to son;  They are applying for [employer] Aid [employer] Attend[employer] benefits wanting someone to come into the home to essentially clean & cook for them> the forms were completed w/ the help of pt & his son;  Medically Edward Harvey is stable> HOH w/o change; Breathing stable & chest clear; Denies CP/ palpit/ ch in DOE, edema, etc; GI is stable as well, and CC= DJD w/ difficulty ambulating etc...  ~  January 14, 2011:  36mo ROV & doing satis w/o new complaints or concerns; we reviewed his current meds & prev Labs & XRays...     AB> no recent exac, breathing stable w/o cough, sputum, dyspnea...    HBP, CAD, AVBlock, Pacer> he denies CP, palpit, ch in SOB, edema, etc; BP= 110/68 & tolerating meds well...    Divertics, Hx SBO, Ing Hernias, Gallstones> he continues to do well w/o Abd/GI symptoms...    Prostate Ca> followed by Edward Harvey & stable "just slow" he says...    DJD, Gout> ambulates w/ cane & doing ok by his review...  ~  June 03, 2011:  36mo ROV & he says "getting older & slower" but notes that he & his wife will celebrate their 74nd Edward Harvey this coming yr...     AB> no recent URI or breathing prob reported...    HBP> on ASA81, Coreg6.25Bid, Quinapril20Bid, Lasix20; wt is down 5# on diet to 223#; BP= 116/78...    CAD/ AVblock/ Pacer> on  meds above; he denies CP, palpit, ch in dyspnea or edema...    Divertics w/ hx hemorrhage/ Hx SBO> he denies abd pain, N/V, D/C, ch in bowels or blood seen...    Hx Prostate Cancer> followed by Edward Harvey but we don't have recent notes...     DJD/ Gout> on Allopurinol300, OTC analgesics prn & MVI/ Vit D supplement;     Hx subdural hematoma> required burr holes back then; no known sequellae & he hasn't been falling...  ~  Oct 03, 2011:  36mo ROV & Edward Harvey is here w/ his son today- his wife passed away 08/30/22;  He appears to be doing ok- "just older & slower" as he's said before; BP controlled, breathing is ok, denies CP etc;  See prob list below>> CXR 5/13 showed stable heart size & pacer, sl hyperinflation, clear lungs, DJD spine, NAD... LABS 5/13:  Chems- ok w/ BUN=25 Creat=1.4 Uric=5.8 on Allopurinol...  ~  February 05, 2012:  36mo ROV & Edward Harvey denies any problems other than "getting older"; he notes the vision in his left eye is getting worse, on 3 diff eye drops, & he stopped driving 3/66 (Edward Harvey sent him to [facility] for 2nd opinion);  BP is well controlled on Coreg, Quinapril, & Lasix;  He denies  CP, palpit, ch in SOB or edema;  GI is ok w/o recent problems, but his urine in slower & we discussed trial FLOMAX 0.4mg  Qhs;  Overall he is quite remarkable for 78 y/o... We reviewed prob list, meds, xrays and labs> see below for updates >>  ~  March 19, 2012:  Add-on at pt request> he saw TP 02/26/12 c/o hoarseness & bad taste, phlegm in throat, right lower rib cage discomfort; exam was neg & he was rec to take Claritin5mg , nasal saline, heating pad to ribs , and Advil prn;  He reports that his symptoms have all abated, feeling back to baseline, "just older & slower" he reiterates...  We reviewed prob list, meds, xrays and labs> see below for updates >>         Problem List:    GLAUCOMA (ICD-365.9) - prev followed by Edward Harvey w/ bilat cataract surg & glaucoma on eye 3 diff drops as noted...  HEARING LOSS  - son says he has 3 sets of hearing aides but won't wear any of them...  Hx of ASTHMATIC BRONCHITIS, ACUTE (ICD-466.0) - he has never smoked... hx recurrent bronchitic infections over the yrs w/ reactive airway component... no recent prob, and no regular meds required.   HYPERTENSION (ICD-401.9) - on ASA 81mg /d, COREG 6.25Bid, QUINAPRIL 20mg Bid, LASIX 20mg /d... He takes his own meds & states no problems. ~  4/12:  BP= 110/74 & feeling well... he denies HA, visual changes, CP, palipit, dizziness, syncope, change in dyspnea, etc... ~  8/12:  BP= 110/68 & continues stable... ~  12/12:  BP= 116/78 & he remains asymptomatic... ~  5/13:  BP= 126/80 & he continues to deny CP, palpit, SOB, edema, etc... ~  9/13:  BP= 126/78 & he denies CP, palpit, ch in SOB or edema... ~  10/13:  BP= 102/66 & he remains largely asymptomatic...  CORONARY ARTERY DISEASE (ICD-414.00) - followed by Edward Harvey for Cardiology on above meds... pt has not had a prev cath... ~  2DEcho 4/05 showed mild conc LVH and norm LVF- improved from 2002 when EF= 45%... ~  NuclearStressTest 10/06 showed prior inferoseptal & apical infarct w/ apical AK, w/o ischemia, EF= 48%... no change from prev.  Hx of AV BLOCK, COMPLETE (ICD-426.0), & CARDIAC PACEMAKER IN SITU (ICD-V45.01) - he had a pacemaker placed for complete heart block initially in 1994, & this was exchanged for a new dual chamber pacer: Valene Bors DDDR model [id number] by [facility] 6/06... ~  seen by Edward Harvey 1/10 w/ pacer check OK, no further episodes of AFib on his monitor... not a Coumadin candidate due to age, unsteady, hx of GI bleeds, and prev subdural hematoma. ~  followed regularly by Edward Harvey's pacer clinic (last note 10/12 reviewed)> stable, doing well, no changes made.  DIVERTICULOSIS OF COLON WITH HEMORRHAGE (ICD-562.12)  GASTROINTESTINAL HEMORRHAGE (ICD-578.9) - he had a subtotal colectomy w/ ileum to distal sigmoid anastomosis in Jul03 by Edward Harvey... ~  EGD 8/03 by Edward Harvey was  normal... ~  colonoscopy 1/08 by Edward Harvey showed inflammed mucosa in sm bowel prox to the ileo-colonic anastomosis, few divertics in remaining distal colon, no polyps etc...  ~  f/u colonoscopy 5/09 showed prev colectomy- anastomosis granular & bleeding ?Crohn's... also had divertics & hems... Rx'd w/ Lialda.  Hx of SMALL BOWEL OBSTRUCTION (ICD-560.9) > see [id number] Hospitalization by [facility]...  INGUINAL HERNIA (ICD-550.90) - he has bilat fat containing inguinal hernias seen on CT Abd in 2008...   GALLSTONES (ICD-574.20) - Sonar 4/11 showed mult gallstones  filling the gallbladder, and CT Abd 4/11 showed mild extrahep biliary ductal dilatation... he was evaluated by Edward Harvey/ GI, Edward Harvey/ Surg, Edward Harvey/ Cards> all agreed that risk was hi & to hold off on surg unless absolutely necessary... ~  4/12 & 8/12:  He remains asymptomatic w/o abd pain, N/V, etc...  RENAL INSUFFICIENCY >> Creat in the 1.4 - 1.5 range... CARCINOMA, PROSTATE, HX OF (ICD-V10.46) - diagnosed in 1993 & treated w/ XRT... PSA initially ~29 and dropped to ~1 after XRT & it has remained there ever since... followed by Edward Harvey- also has BPH, min obstructive symptoms, bilat hydroceles & some benign renal cysts... ~  labs 3/07 by Urology showed PSA= 1.19 ~  labs here 1/10 showed PSA= 1.37 ~  5/11: he reports f/u Edward Harvey w/ incontinence symptoms Rx'd [facility] XR 60mg /d & improved, so he stopped this on his own & states that he is doing satis now... ~  He continues to f/u w/ Edward Harvey regularly- we do not have recent notes from Urology... ~  9/13:  He notes slower urinary stream & we decided to try FLOMAX 0.4mg Qhs...  DEGENERATIVE JOINT DISEASE (ICD-715.90) >> Hx of GOUT (ICD-274.9) >> he has hx of DJD and Gout treated w/ TRAMADOL Prn & ALLOPURINOL 300mg /d; also takes MVI & VIT D 1000 u daily... ~  Labs 5/13 showed Uric= 5.8  HEMATOMA, SUBDURAL (ICD-432.1) - he had a burr hole placed for subdural hematoma in the past...     Past Surgical History  Procedure Date  . Pacemaker placement   . Cataract extraction, bilateral   . Tonsillectomy age 31  . Subtotal colectomy 12/03    Edward. Ezzard Standing  . Lipoma excision 2001    left leg    Outpatient Encounter Prescriptions as of 03/19/2012  Medication Sig Dispense Refill  . allopurinol (ZYLOPRIM) 300 MG tablet TAKE 1 TABLET BY MOUTH ONCE A DAY  90 tablet  3  . aspirin 81 MG chewable tablet Chew 81 mg by mouth daily.        . carvedilol (COREG) 6.25 MG tablet TAKE ONE TABLET BY MOUTH TWICE A DAY  180 tablet  2  . Cholecalciferol (VITAMIN D) 1000 UNITS capsule Take 1,000 Units by mouth daily.        . furosemide (LASIX) 20 MG tablet TAKE 1 TABLET BY MOUTH EVERY DAY  90 tablet  3  . Multiple Vitamins-Minerals (CENTRUM SILVER PO) Take 1 tablet by mouth daily.        . quinapril (ACCUPRIL) 20 MG tablet TAKE 1 TABLET BY MOUTH TWICE A DAY  180 tablet  2  . Tamsulosin HCl (FLOMAX) 0.4 MG CAPS Take 1 capsule (0.4 mg total) by mouth daily after supper.  30 capsule  11  . brimonidine (ALPHAGAN P) 0.1 % SOLN Apply 1 drop to eye 3 (three) times daily.        . dorzolamide-timolol (COSOPT) 22.3-6.8 MG/ML ophthalmic solution Place 1 drop into both eyes 2 (two) times daily.        Marland Kitchen latanoprost (XALATAN) 0.005 % ophthalmic solution Place 1 drop into both eyes at bedtime.          No Known Allergies   Current Medications, Allergies, Past Medical History, Past Surgical History, Family History, and Social History were reviewed in [employer] record.    Review of Systems        See HPI - all other systems neg except as noted... The patient complains of dyspnea on exertion, peripheral edema, muscle  weakness, and difficulty walking.  The patient denies anorexia, fever, weight loss, weight gain, hoarseness, chest pain, syncope, prolonged cough, headaches, hemoptysis, abdominal pain, melena, hematochezia, severe indigestion/heartburn, hematuria, incontinence,  suspicious skin lesions, transient blindness, depression, unusual weight change, abnormal bleeding, enlarged lymph nodes, and angioedema.     Objective:   Physical Exam     WD, overweight, 78 y/o BM in NAD...  GENERAL:  Alert, pleasant & cooperative;  VS- reviewed... HEENT:  [place]/AT, EOM- sl strabismus, PERRLA, EACs-clear (HOH), TMs-wnl, NOSE-clear discharge , THROAT-clear & wnl. NECK:  Supple w/ decrROM; no JVD; normal carotid impulses w/o bruits; no thyromegaly or nodules palpated; no lymphadenopathy. CHEST:  Clear to P & A; without wheezes/ rales/ or rhonchi heard;  Pacer palp in right chest... HEART:  Regular Rhythm; without murmurs/ rubs/ or gallops detected... ABDOMEN:  Scar of prev surg, soft & nontender; normal bowel sounds; no organomegaly or masses palpated...  EXT:  Mod-severe arthritic changes; no varicose veins/ +venous insuffic/ tr edema. NEURO:  CN's intact; motor testing normal; sensory testing inconsistant; gait is abnormal & balance only fair... DERM:  No lesions noted; no rash etc...  RADIOLOGY DATA:  Reviewed in the EPIC EMR & discussed w/ the patient...  LABORATORY DATA:  Reviewed in the EPIC EMR & discussed w/ the patient...   Assessment & Plan:   Hx upper resp symptoms> all Harvey much resolved now & back to baseline...   HBP>  BP stable, continue same meds...  CAD, PACER>  He denies angina, palpit, syncope, etc;  Continue cardiology f/u w/ DrTomWall & Edward Harvey.  Divertics>  He has hx GI hemorrhage in past;  Stable no abd pain, etc...  Gallstones>  He remains asymptomatic...  Prostate Ca>  Followed by Edward Harvey & stable...  DJD>  This is his main issue w/ mobility, self care, remaining independent...  Hx Subdural Hematoma>  Stable w/o acute problems...   Patient's Medications  New Prescriptions   No medications on file  Previous Medications   ALLOPURINOL (ZYLOPRIM) 300 MG TABLET    TAKE 1 TABLET BY MOUTH ONCE A DAY   ASPIRIN 81 MG CHEWABLE TABLET     Chew 81 mg by mouth daily.     BRIMONIDINE (ALPHAGAN P) 0.1 % SOLN    Apply 1 drop to eye 3 (three) times daily.     CARVEDILOL (COREG) 6.25 MG TABLET    TAKE ONE TABLET BY MOUTH TWICE A DAY   CHOLECALCIFEROL (VITAMIN D) 1000 UNITS CAPSULE    Take 1,000 Units by mouth daily.     DORZOLAMIDE-TIMOLOL (COSOPT) 22.3-6.8 MG/ML OPHTHALMIC SOLUTION    Place 1 drop into both eyes 2 (two) times daily.     FUROSEMIDE (LASIX) 20 MG TABLET    TAKE 1 TABLET BY MOUTH EVERY DAY   LATANOPROST (XALATAN) 0.005 % OPHTHALMIC SOLUTION    Place 1 drop into both eyes at bedtime.     MULTIPLE VITAMINS-MINERALS (CENTRUM SILVER PO)    Take 1 tablet by mouth daily.     QUINAPRIL (ACCUPRIL) 20 MG TABLET    TAKE 1 TABLET BY MOUTH TWICE A DAY   TAMSULOSIN HCL (FLOMAX) 0.4 MG CAPS    Take 1 capsule (0.4 mg total) by mouth daily after supper.  Modified Medications   No medications on file  Discontinued Medications   No medications on file

## 2012-03-19 NOTE — Patient Instructions (Addendum)
Today we updated your med list in our EPIC system...    Continue your current medications the same...  Call for any questions...  Keep your scheduled ROV in [id number].Marland KitchenMarland Kitchen

## 2012-03-25 ENCOUNTER — Ambulatory Visit (INDEPENDENT_AMBULATORY_CARE_PROVIDER_SITE_OTHER): Payer: Medicare Other | Admitting: Internal Medicine

## 2012-03-25 ENCOUNTER — Encounter: Payer: Self-pay | Admitting: Internal Medicine

## 2012-03-25 VITALS — BP 90/60 | HR 66 | Ht 73.0 in | Wt 218.4 lb

## 2012-03-25 DIAGNOSIS — I442 Atrioventricular block, complete: Secondary | ICD-10-CM

## 2012-03-25 DIAGNOSIS — I4891 Unspecified atrial fibrillation: Secondary | ICD-10-CM | POA: Diagnosis not present

## 2012-03-25 DIAGNOSIS — Z950 Presence of cardiac pacemaker: Secondary | ICD-10-CM | POA: Diagnosis not present

## 2012-03-25 DIAGNOSIS — I10 Essential (primary) hypertension: Secondary | ICD-10-CM

## 2012-03-25 LAB — PACEMAKER DEVICE OBSERVATION
AL THRESHOLD: 0.5 V
ATRIAL PACING PM: 54
BAMS-0003: 60 {beats}/min
BATTERY VOLTAGE: 2.76 V
DEVICE MODEL PM: 1522496
VENTRICULAR PACING PM: 99

## 2012-03-25 MED ORDER — QUINAPRIL HCL 20 MG PO TABS: 20.0000 mg | ORAL_TABLET | Freq: Every day | ORAL | Status: DC

## 2012-03-25 NOTE — Assessment & Plan Note (Signed)
Blood pressure is low today and was low last week. We will decrease his quinapril to taking 20 mg at night.

## 2012-03-25 NOTE — Progress Notes (Signed)
Patient Care Team: Michele Mcalpine, MD as PCP - General (Pulmonary Disease)   HPI  Edward Harvey is a 78 y.o. male followup for syncope with complete heart block. He  is status post pacemaker implantation. he has no symptoms of chest pain. No shortness of breath.he does have a minimal amount of edema.  Past Medical History  Diagnosis Date  . Glaucoma(365)   . Acute bronchitis   . Hypertension   . CAD (coronary artery disease)     nuclear stress test 2006 inferoseptal and apical infarct ejection fraction 48%  . Atrioventricular block, complete   . Cardiac pacemaker in situ   . Diverticulosis of colon with hemorrhage   . Hemorrhage of gastrointestinal tract, unspecified   . Unspecified intestinal obstruction   . Inguinal hernia   . Gallstone   . Prostate cancer   . DJD (degenerative joint disease)   . Gout   . Hematoma     subdural    Past Surgical History  Procedure Date  . Pacemaker placement   . Cataract extraction, bilateral   . Tonsillectomy age 53  . Subtotal colectomy 12/03    Dr. Ezzard Standing  . Lipoma excision 2001    left leg    Current Outpatient Prescriptions  Medication Sig Dispense Refill  . allopurinol (ZYLOPRIM) 300 MG tablet TAKE 1 TABLET BY MOUTH ONCE A DAY  90 tablet  3  . aspirin 81 MG chewable tablet Chew 81 mg by mouth daily.        . brimonidine (ALPHAGAN P) 0.1 % SOLN Apply 1 drop to eye 3 (three) times daily.        . carvedilol (COREG) 6.25 MG tablet TAKE ONE TABLET BY MOUTH TWICE A DAY  180 tablet  2  . Cholecalciferol (VITAMIN D) 1000 UNITS capsule Take 1,000 Units by mouth daily.        . dorzolamide-timolol (COSOPT) 22.3-6.8 MG/ML ophthalmic solution Place 1 drop into both eyes 2 (two) times daily.        . furosemide (LASIX) 20 MG tablet TAKE 1 TABLET BY MOUTH EVERY DAY  90 tablet  3  . latanoprost (XALATAN) 0.005 % ophthalmic solution Place 1 drop into both eyes at bedtime.        . Multiple Vitamins-Minerals (CENTRUM SILVER PO) Take 1  tablet by mouth daily.        . quinapril (ACCUPRIL) 20 MG tablet TAKE 1 TABLET BY MOUTH TWICE A DAY  180 tablet  2  . Tamsulosin HCl (FLOMAX) 0.4 MG CAPS Take 1 capsule (0.4 mg total) by mouth daily after supper.  30 capsule  11    No Known Allergies  Review of Systems negative except from HPI and PMH  Physical Exam BP 90/60  Pulse 66  Ht 6\' 1"  (1.854 m)  Wt 218 lb 6.4 oz (99.066 kg)  BMI 28.81 kg/m2 I confirmed systolic blood pressure Well developed and well nourished in no acute distress HENT normal E scleral and icterus clear Neck Supple JVP flat; carotids brisk and full Clear to ausculation  Regular rate and rhythm,  Soft with active bowel sounds No clubbing cyanosis trace  Edema Alert and oriented, grossly normal motor and sensory function Skin Warm and Dry Electrocardiogram demonstrates P. synchronous pacing    Assessment and  Plan

## 2012-03-25 NOTE — Patient Instructions (Addendum)
Your physician wants you to follow-up in: 6 months with device clinic and 12 months with Dr Graciela Husbands.  You will receive a reminder letter in the mail two months in advance. If you don't receive a letter, please call our office to schedule the follow-up appointment.  Your physician has recommended you make the following change in your medication: ACCUPRIL 20mg  only once per day at bedtime.

## 2012-03-25 NOTE — Assessment & Plan Note (Signed)
Stable post pacing 

## 2012-03-25 NOTE — Assessment & Plan Note (Signed)
Newly detected the his device. His CHADS-VASc score is 4. He has a history of a prior subdural hematoma. His balance is not bad although he walks with a stick. He has a remote history of GI bleeding. He has tolerated aspirin. It would be reasonable thing to use a NOAC, specifically apixaban. I've asked the family to review the cost of this and we have begun the discussions regarding risk benefit. I will try and get Dr. Tienda and Dr. Hillary Bow to weigh in on this. As they know him better.

## 2012-03-25 NOTE — Assessment & Plan Note (Signed)
The patient's device was interrogated.  The information was reviewed. No changes were made in the programming.    

## 2012-03-26 ENCOUNTER — Telehealth: Payer: Self-pay | Admitting: Pulmonary Disease

## 2012-03-26 NOTE — Telephone Encounter (Signed)
Called and lmomtcb.   

## 2012-03-27 NOTE — Telephone Encounter (Signed)
Called spoke with patient who would like SN to be aware:  1- pt saw Dr Graciela Husbands on 10.23.13.  At the ov, pt's BP was low (90/60) and his quinapril 20mg  was decreased from BID to QD QHS.  Pt stated is doing "good today." 2- last week prior to appt w/ SN, pt stated he woke up and his "feet were cold."  This has never happened before.  He put a blanket on his feet and they warmed up, but would like SN to be aware. 3- onset yesterday when pt had BM, he felt a "sting" and wonders who he should follow up with > SN or Dr Jarold Motto.  Pt stated the "sting" was at hs rectum, not in his stomach.  Pt stated he has had hemorrhoids before.  Denied hard stools/constipation; unsure if this could be from a fissure.  Denied bleeding.  Does not want to use otc hemorrhoidal treatment until he gets SN's recs.  Pt aware SN out of the office today and will return on Monday.  Pt aware to call back or go to UC/[facility] if the stinging worsens or he sees blood with/after BM.  Dr Kriste Basque please advise, thanks.

## 2012-03-30 MED ORDER — HYDROCORTISONE 2.5 % RE CREA: TOPICAL_CREAM | Freq: Two times a day (BID) | RECTAL | Status: DC

## 2012-03-30 NOTE — Telephone Encounter (Signed)
Per SN---call in anusol hc cream  Apply  After each BM

## 2012-03-30 NOTE — Telephone Encounter (Signed)
Spoke with pt and notified of recs per SN Pt verbalized understanding and states nothing further needed Rx was sent to pharm 

## 2012-04-03 DIAGNOSIS — B351 Tinea unguium: Secondary | ICD-10-CM | POA: Diagnosis not present

## 2012-04-03 DIAGNOSIS — M79609 Pain in unspecified limb: Secondary | ICD-10-CM | POA: Diagnosis not present

## 2012-04-06 ENCOUNTER — Telehealth: Payer: Self-pay | Admitting: Pulmonary Disease

## 2012-04-06 NOTE — Telephone Encounter (Signed)
I spoke with pt and he stated the anusol HC cream prescribed for him helps temporarily. Now c/o slight blood in his stool, still stings when he has BM. Pt stated he does not know who he needs to call. I advised pt he needed to call Dr. Jarold Motto his GI doctor. He stated he will give them a call. Nothing further was needed

## 2012-04-09 ENCOUNTER — Telehealth: Payer: Self-pay | Admitting: Gastroenterology

## 2012-04-10 MED ORDER — HYDROCORTISONE 2.5 % RE CREA: TOPICAL_CREAM | Freq: Two times a day (BID) | RECTAL | Status: DC

## 2012-04-10 NOTE — Telephone Encounter (Signed)
yes

## 2012-04-10 NOTE — Telephone Encounter (Signed)
Informed pt we will refill his Anusol cream and be sure to apply after a BM to help with the burning. He may also soak in the tub a couple of times daily to shrink the hemorrhoids. Encouraged pt to call back if this doesn't help; pt stated understanding.

## 2012-04-10 NOTE — Telephone Encounter (Signed)
Pt's last COLON 10/28/07 for + digital exam. COLON revealed hemorrhoids, diverticulosis and path showed chronic active focal ulceration like Crohn's. Today pre repots he's had diarrhea, but stools are normal now. He does c/o burning after a BM. Dr Kriste Basque ordered Anusol cream, but he is almost out. He has been using the cream BID, but Nadel's orders were to apply after a BM up to BID. OK to order more Anusol or other advice? Thanks.

## 2012-04-20 DIAGNOSIS — H4011X0 Primary open-angle glaucoma, stage unspecified: Secondary | ICD-10-CM | POA: Diagnosis not present

## 2012-04-20 DIAGNOSIS — Z961 Presence of intraocular lens: Secondary | ICD-10-CM | POA: Diagnosis not present

## 2012-04-20 DIAGNOSIS — H353190 Nonexudative age-related macular degeneration, unspecified eye, stage unspecified: Secondary | ICD-10-CM | POA: Diagnosis not present

## 2012-04-20 DIAGNOSIS — H409 Unspecified glaucoma: Secondary | ICD-10-CM | POA: Diagnosis not present

## 2012-05-05 ENCOUNTER — Encounter: Payer: Self-pay | Admitting: Gastroenterology

## 2012-05-05 ENCOUNTER — Ambulatory Visit (INDEPENDENT_AMBULATORY_CARE_PROVIDER_SITE_OTHER): Payer: Medicare Other | Admitting: Gastroenterology

## 2012-05-05 VITALS — BP 108/60 | HR 77 | Ht 73.0 in | Wt 219.0 lb

## 2012-05-05 DIAGNOSIS — Z9889 Other specified postprocedural states: Secondary | ICD-10-CM | POA: Diagnosis not present

## 2012-05-05 DIAGNOSIS — R197 Diarrhea, unspecified: Secondary | ICD-10-CM | POA: Diagnosis not present

## 2012-05-05 DIAGNOSIS — R195 Other fecal abnormalities: Secondary | ICD-10-CM

## 2012-05-05 DIAGNOSIS — Z9049 Acquired absence of other specified parts of digestive tract: Secondary | ICD-10-CM

## 2012-05-05 MED ORDER — MESALAMINE 1.2 G PO TBEC: 1.2000 g | DELAYED_RELEASE_TABLET | Freq: Two times a day (BID) | ORAL | Status: DC

## 2012-05-05 NOTE — Progress Notes (Signed)
This is a very complicated 78 year old African American male who has had previous partial colectomy because of recurrent diverticulitis.  In the past he has had evidence of Crohn's colitis seen at, flexible sigmoidoscopy 5 years ago.  He responded to oral aminosalicylate therapy.  He is done well since that time, but now returns with 2 weeks of diarrhea without abdominal pain, melena, hematochezia, or systemic complaints.  He denies recent antibiotic exposure.  His son is with him today and reports that he has had good response to generic Imodium.  There is been no anorexia, weight loss, fever, chills, use of antibiotics or steroids.  His diarrhea is worse with high fiber foods.  The patient's son is with him throughout the interview and exam.  Current Medications, Allergies, Past Medical History, Past Surgical History, Family History and Social History were reviewed in [employer] record.  Pertinent Review of Systems Negative   Physical Exam: Healthy elderly-appearing patient in no distress.  Blood pressure 108/60 pulse 77 and regular, and weight 219 pounds with a BMI of 28.89.  Oxygen saturation is only 79% on room air.  Examination the abdomen shows no organomegaly, masses, or tenderness.  He has a well-healed lower abdominal scar.  Our sounds are nonobstructive.  Specks of rectum shows some perianal swelling of yellowish formed stool.  There is no rectal tone, but I cannot appreciate rectal masses or tenderness.  There is soft stool which is guaiac positive.  Mental status is normal.    Assessment and Plan:  This patient has had a previous partial subtotal colectomy for recurrent diverticulitis.  There is reason to suspect he may have underlying inflammatory bowel disease , and I placed him on Lialda 2.4 g a day pending stool culture and stool C. difficile exam.  He may need a flexible sigmoidoscopy exam without sedation because of his age and fragile medical state.  Hopefully  this will not be needed.  He can continue use generic Imodium, and I have advised low fiber diet pending his workup and clinical course.  He has multiple severe cardiac problems, and is followed by cardiology and Dr. [name]. Encounter Diagnosis  Name Primary?  . Diarrhea Yes

## 2012-05-05 NOTE — Patient Instructions (Addendum)
Your physician has requested that you go to the basement for lab work before leaving today  Please take Lialda one tablet by mouth twice daily. If this works please call back and we can send in a prescription  Continue your diarrhea medicine that you use over the counter

## 2012-05-07 ENCOUNTER — Other Ambulatory Visit: Payer: Medicare Other

## 2012-05-07 DIAGNOSIS — R197 Diarrhea, unspecified: Secondary | ICD-10-CM

## 2012-05-08 LAB — CLOSTRIDIUM DIFFICILE BY PCR: Toxigenic C. Difficile by PCR: NOT DETECTED

## 2012-06-09 ENCOUNTER — Other Ambulatory Visit (INDEPENDENT_AMBULATORY_CARE_PROVIDER_SITE_OTHER): Payer: Medicare Other

## 2012-06-09 ENCOUNTER — Encounter: Payer: Self-pay | Admitting: Pulmonary Disease

## 2012-06-09 ENCOUNTER — Ambulatory Visit (INDEPENDENT_AMBULATORY_CARE_PROVIDER_SITE_OTHER): Payer: Medicare Other | Admitting: Pulmonary Disease

## 2012-06-09 VITALS — BP 118/70 | HR 65 | Temp 97.6°F | Ht 73.0 in | Wt 225.0 lb

## 2012-06-09 DIAGNOSIS — F411 Generalized anxiety disorder: Secondary | ICD-10-CM | POA: Diagnosis not present

## 2012-06-09 DIAGNOSIS — M109 Gout, unspecified: Secondary | ICD-10-CM | POA: Diagnosis not present

## 2012-06-09 DIAGNOSIS — K5731 Diverticulosis of large intestine without perforation or abscess with bleeding: Secondary | ICD-10-CM | POA: Diagnosis not present

## 2012-06-09 DIAGNOSIS — Z950 Presence of cardiac pacemaker: Secondary | ICD-10-CM

## 2012-06-09 DIAGNOSIS — I2510 Atherosclerotic heart disease of native coronary artery without angina pectoris: Secondary | ICD-10-CM

## 2012-06-09 DIAGNOSIS — Z87828 Personal history of other (healed) physical injury and trauma: Secondary | ICD-10-CM

## 2012-06-09 DIAGNOSIS — F0390 Unspecified dementia without behavioral disturbance: Secondary | ICD-10-CM

## 2012-06-09 DIAGNOSIS — J209 Acute bronchitis, unspecified: Secondary | ICD-10-CM

## 2012-06-09 DIAGNOSIS — I10 Essential (primary) hypertension: Secondary | ICD-10-CM

## 2012-06-09 DIAGNOSIS — F419 Anxiety disorder, unspecified: Secondary | ICD-10-CM

## 2012-06-09 DIAGNOSIS — R269 Unspecified abnormalities of gait and mobility: Secondary | ICD-10-CM

## 2012-06-09 DIAGNOSIS — I442 Atrioventricular block, complete: Secondary | ICD-10-CM | POA: Diagnosis not present

## 2012-06-09 DIAGNOSIS — K5010 Crohn's disease of large intestine without complications: Secondary | ICD-10-CM

## 2012-06-09 DIAGNOSIS — M1990 Unspecified osteoarthritis, unspecified site: Secondary | ICD-10-CM

## 2012-06-09 DIAGNOSIS — N289 Disorder of kidney and ureter, unspecified: Secondary | ICD-10-CM

## 2012-06-09 DIAGNOSIS — Z8546 Personal history of malignant neoplasm of prostate: Secondary | ICD-10-CM

## 2012-06-09 DIAGNOSIS — K56609 Unspecified intestinal obstruction, unspecified as to partial versus complete obstruction: Secondary | ICD-10-CM

## 2012-06-09 LAB — HEPATIC FUNCTION PANEL
ALT: 11 U/L (ref 0–53)
AST: 15 U/L (ref 0–37)
Alkaline Phosphatase: 72 U/L (ref 39–117)
Bilirubin, Direct: 0.1 mg/dL (ref 0.0–0.3)
Total Bilirubin: 0.8 mg/dL (ref 0.3–1.2)
Total Protein: 6.9 g/dL (ref 6.0–8.3)

## 2012-06-09 LAB — CBC WITH DIFFERENTIAL/PLATELET
Basophils Absolute: 0 10*3/uL (ref 0.0–0.1)
Basophils Relative: 0.4 % (ref 0.0–3.0)
Eosinophils Absolute: 0.1 10*3/uL (ref 0.0–0.7)
Lymphocytes Relative: 18.2 % (ref 12.0–46.0)
MCHC: 32.8 g/dL (ref 30.0–36.0)
MCV: 102.5 fl — ABNORMAL HIGH (ref 78.0–100.0)
Monocytes Absolute: 0.9 10*3/uL (ref 0.1–1.0)
Neutrophils Relative %: 70 % (ref 43.0–77.0)
Platelets: 177 10*3/uL (ref 150.0–400.0)
RDW: 15.6 % — ABNORMAL HIGH (ref 11.5–14.6)

## 2012-06-09 LAB — SEDIMENTATION RATE: Sed Rate: 38 mm/hr — ABNORMAL HIGH (ref 0–22)

## 2012-06-09 LAB — BASIC METABOLIC PANEL
BUN: 23 mg/dL (ref 6–23)
CO2: 27 mEq/L (ref 19–32)
Chloride: 109 mEq/L (ref 96–112)
Glucose, Bld: 73 mg/dL (ref 70–99)
Potassium: 4.4 mEq/L (ref 3.5–5.1)
Sodium: 140 mEq/L (ref 135–145)

## 2012-06-09 LAB — TSH: TSH: 1.76 u[IU]/mL (ref 0.35–5.50)

## 2012-06-09 MED ORDER — MESALAMINE 1.2 G PO TBEC: 1.2000 g | DELAYED_RELEASE_TABLET | Freq: Two times a day (BID) | ORAL | Status: DC

## 2012-06-09 NOTE — Patient Instructions (Addendum)
Today we updated your med list in our EPIC system...    Continue your current medications the same...    We refilled the meds you requested today...  Today we did your follow up blood work...    We will contact you w/ the results when avail...  Call for any questions...  Let's plan a follow up visit in 4 months time.Marland KitchenMarland Kitchen

## 2012-06-09 NOTE — Progress Notes (Signed)
Subjective:    Patient ID: Edward Harvey, male    DOB: [birth year], 79 y.o.   MRN: [id number]  HPI 79 y/o BM here for a follow up visit... he has multiple medical problems including HBP, CAD, & complete heart block w/ pacer followed by Edward Harvey & Edward Harvey;  hx of GI bleed from divertics w/ subtotal colectomy in 2003 & followed by Edward Harvey;  hx SBO, inguinal hernias, gallstones;  hx prostate cancer followed by Edward Harvey;  DJD & Gout;  hx subdural hematoma prev requiring burr hole, & mild senile dementia...  ~  June 03, 2011:  75mo ROV & he says "getting older & slower" but notes that he & his wife will celebrate their 74nd Edward Harvey this coming yr...     AB> no recent URI or breathing prob reported...    HBP> on ASA81, Coreg6.25Bid, Quinapril20Bid, Lasix20; wt is down 5# on diet to 223#; BP= 116/78...    CAD/ AVblock/ Pacer> on meds above; he denies CP, palpit, ch in dyspnea or edema...    Divertics w/ hx hemorrhage/ Hx SBO> he denies abd pain, N/V, D/C, ch in bowels or blood seen...    Hx Prostate Cancer> followed by Edward Harvey but we don't have recent notes...     DJD/ Gout> on Allopurinol300, OTC analgesics prn & MVI/ Vit D supplement;     Hx subdural hematoma> required burr holes back then; no known sequellae & he hasn't been falling...  ~  Oct 03, 2011:  75mo ROV & [name] is here w/ his son today- his wife passed away September 01, 2022;  He appears to be doing ok- "just older & slower" as he's said before; BP controlled, breathing is ok, denies CP etc;  See prob list below>> CXR 5/13 showed stable heart size & pacer, sl hyperinflation, clear lungs, DJD spine, NAD... LABS 5/13:  Chems- ok w/ BUN=25 Creat=1.4 Uric=5.8 on Allopurinol...  ~  February 05, 2012:  75mo ROV & [name] denies any problems other than "getting older"; he notes the vision in his left eye is getting worse, on 3 diff eye drops, & he stopped driving 9/14 (Edward Harvey sent him to Edward Harvey for 2nd opinion);  BP is well controlled on Coreg,  Quinapril, & Lasix;  He denies CP, palpit, ch in SOB or edema;  GI is ok w/o recent problems, but his urine in slower & we discussed trial FLOMAX 0.4mg  Qhs;  Overall he is quite remarkable for 79 y/o... We reviewed prob list, meds, xrays and labs> see below for updates >>  ~  March 19, 2012:  Add-on at pt request> he saw Edward Harvey 02/26/12 c/o hoarseness & bad taste, phlegm in throat, right lower rib cage discomfort; exam was neg & he was rec to take Claritin5mg , nasal saline, heating pad to ribs , and Advil prn;  He reports that his symptoms have all abated, feeling back to baseline, "just older & slower" he reiterates...  We reviewed prob list, meds, xrays and labs> see below for updates >>   ~  June 09, 2102:  68mo ROV & Edward Harvey indicates he had some diarrhea, saw Edward Harvey & now better w/ addition of Lialda 1.2gmBid for Crohn's (but he thinks it was salads); We reviewed the following medical problems during today's office visit>>     AB> no recent URI or breathing prob reported...    HBP> on ASA81, Coreg6.25Bid, Quinapril20, Lasix20; wt is up 8# on diet to 225#; BP= 118/70...    CAD/ AVblock/ Pacer> on meds above;  he denies CP, palpit, ch in dyspnea or edema...    Divertics w/ hx hemorrhage/ Hx SBO> he denies abd pain, N/V/C; recent diarrhea improved w/ Lialda.    Hx Prostate Cancer> followed by Edward Harvey but we don't have recent notes...     DJD/ Gout> on Allopurinol300, OTC analgesics prn & MVI/ Vit D supplement;     Hx subdural hematoma> required burr holes back then; no known sequellae & he hasn't been falling... We reviewed prob list, meds, xrays and labs> see below for updates >>  LABS 1/14:  Chems- wnl w/ Creat=1.4 Alb=3.0;  CBC- ok w/ Hg=11.9;  TSH=1.76;  Sed=38... rec attn to diet & continue Lialda.         Problem List:    GLAUCOMA (ICD-365.9) - prev followed by Edward Harvey w/ bilat cataract surg & glaucoma on eye 3 diff drops as noted...  HEARING LOSS - son says he has 3 sets of  hearing aides but won't wear any of them...  Hx of ASTHMATIC BRONCHITIS, ACUTE (ICD-466.0) - he has never smoked... hx recurrent bronchitic infections over the yrs w/ reactive airway component... no recent prob, and no regular meds required.  ~  CXR 5/13 showed stable hrt size & pacer, clear lungs, degen changes in Tspine, NAD...  HYPERTENSION (ICD-401.9) - on ASA 81mg /d, COREG 6.25Bid, QUINAPRIL 20mg Bid, LASIX 20mg /d... He takes his own meds & states no problems. ~  4/12:  BP= 110/74 & feeling well... he denies HA, visual changes, CP, palipit, dizziness, syncope, change in dyspnea, etc... ~  8/12:  BP= 110/68 & continues stable... ~  12/12:  BP= 116/78 & he remains asymptomatic... ~  5/13:  BP= 126/80 & he continues to deny CP, palpit, SOB, edema, etc... ~  9/13:  BP= 126/78 & he denies CP, palpit, ch in SOB or edema... ~  10/13:  BP= 102/66 & he remains largely asymptomatic... ~  1/14:  BP= 118/70 & he continues to deny CP, palpit, ch in SOB/ edema...  CORONARY ARTERY DISEASE (ICD-414.00) - followed by Edward Harvey for Cardiology on above meds... pt has not had a prev cath... ~  2DEcho 4/05 showed mild conc LVH and norm LVF- improved from 2002 when EF= 45%... ~  NuclearStressTest 10/06 showed prior inferoseptal & apical infarct w/ apical AK, w/o ischemia, EF= 48%... no change from prev.  Hx of AV BLOCK, COMPLETE (ICD-426.0), & CARDIAC PACEMAKER IN SITU (ICD-V45.01) - he had a pacemaker placed for complete heart block initially in 1994, & this was exchanged for a new dual chamber pacer: Edward Harvey DDDR model [phone] by [facility] 6/06... ~  seen by Edward Harvey 1/10 w/ pacer check OK, no further episodes of AFib on his monitor... not a Coumadin candidate due to age, unsteady, hx of GI bleeds, and prev subdural hematoma. ~  followed regularly by Edward Harvey's pacer clinic (last note 10/12 reviewed)> stable, doing well, no changes made.  DIVERTICULOSIS OF COLON WITH HEMORRHAGE (ICD-562.12)  GASTROINTESTINAL  HEMORRHAGE (ICD-578.9) - he had a subtotal colectomy w/ ileum to distal sigmoid anastomosis in Jul03 by DrNewman... ~  EGD 8/03 by Dorris Singh was normal... ~  colonoscopy 1/08 by Edward Harvey showed inflammed mucosa in sm bowel prox to the ileo-colonic anastomosis, few divertics in remaining distal colon, no polyps etc...  ~  f/u colonoscopy 5/09 showed prev colectomy- anastomosis granular & bleeding ?Crohn's... also had divertics & hems... Rx'd w/ Lialda.  CROHN'S COLITIS >>  Hx of SMALL BOWEL OBSTRUCTION (ICD-560.9) > see [id number] Hospitalization by [facility]... ~  Abd Sonar 4/11 showed mult gallstones in GB, echogenic renal parenchyma c/w medical renal dis... ~  CT Abd 4/11 showed mild atx at bases, sl GB [place] thickening & ductal dilatation (known gallstones), mult cysts in spleen & kidneys, ectatic ao, bilat fat filled inguinal hernias, degen changes in spine... ~  12/13: he had f/u Edward Harvey & note reviewed> restarted LIALDA 1.2gm Bid for diarrhea...  INGUINAL HERNIA (ICD-550.90) - he has bilat fat containing inguinal hernias seen on CT Abd in 2008...   GALLSTONES (ICD-574.20) - Sonar 4/11 showed mult gallstones filling the gallbladder, and CT Abd 4/11 showed mild extrahep biliary ductal dilatation... he was evaluated by Edward Harvey/ GI, DrNewman/ Surg, Edward Harvey/ Cards> all agreed that risk was hi & to hold off on surg unless absolutely necessary... ~  4/12 & 8/12:  He remains asymptomatic w/o abd pain, N/V, etc...  RENAL INSUFFICIENCY >> Creat in the 1.4 - 1.5 range... CARCINOMA, PROSTATE, HX OF (ICD-V10.46) - diagnosed in 1993 & treated w/ XRT... PSA initially ~29 and dropped to ~1 after XRT & it has remained there ever since... followed by Edward Harvey- also has BPH, min obstructive symptoms, bilat hydroceles & some benign renal cysts... ~  labs 3/07 by Urology showed PSA= 1.19 ~  labs here 1/10 showed PSA= 1.37 ~  5/11: he reports f/u Edward Harvey w/ incontinence symptoms Rx'd [place] XR 60mg /d &  improved, so he stopped this on his own & states that he is doing satis now... ~  He continues to f/u w/ Edward Harvey regularly- we do not have recent notes from Urology... ~  9/13:  He notes slower urinary stream & we decided to try [facility] 0.4mg Qhs... ~  Labs 1/14 showed BUN= 23, creat= 1.4  DEGENERATIVE JOINT DISEASE (ICD-715.90) >> Hx of GOUT (ICD-274.9) >> he has hx of DJD and Gout treated w/ TRAMADOL Prn & ALLOPURINOL 300mg /d; also takes MVI & VIT D 1000 u daily... ~  Labs 5/13 showed Uric= 5.8  HEMATOMA, SUBDURAL (ICD-432.1) - he had a burr hole placed for subdural hematoma in the past...    Past Surgical History  Procedure Date  . Pacemaker placement   . Cataract extraction, bilateral   . Tonsillectomy age 89  . Subtotal colectomy 12/03    Edward. Ezzard Standing  . Lipoma excision 2001    left leg    Outpatient Encounter Prescriptions as of 06/09/2012  Medication Sig Dispense Refill  . allopurinol (ZYLOPRIM) 300 MG tablet TAKE 1 TABLET BY MOUTH ONCE A DAY  90 tablet  3  . aspirin 81 MG chewable tablet Chew 81 mg by mouth daily.        . brimonidine (ALPHAGAN P) 0.1 % SOLN Apply 1 drop to eye 3 (three) times daily.        . carvedilol (COREG) 6.25 MG tablet TAKE ONE TABLET BY MOUTH TWICE A DAY  180 tablet  2  . Cholecalciferol (VITAMIN D) 1000 UNITS capsule Take 1,000 Units by mouth daily.        . furosemide (LASIX) 20 MG tablet TAKE 1 TABLET BY MOUTH EVERY DAY  90 tablet  3  . hydrocortisone (ANUSOL-HC) 2.5 % rectal cream Place rectally 2 (two) times daily. Apply after each BM  30 g  2  . latanoprost (XALATAN) 0.005 % ophthalmic solution Place 1 drop into both eyes at bedtime.        . mesalamine (LIALDA) 1.2 G EC tablet Take 1 tablet (1.2 g total) by mouth 2 (two) times daily.  48 tablet  0  . Multiple Vitamins-Minerals (CENTRUM SILVER PO) Take 1 tablet by mouth daily.        . quinapril (ACCUPRIL) 20 MG tablet Take 1 tablet (20 mg total) by mouth at bedtime.  180 tablet  2  . Tamsulosin  HCl (FLOMAX) 0.4 MG CAPS Take 1 capsule (0.4 mg total) by mouth daily after supper.  30 capsule  11    No Known Allergies   Current Medications, Allergies, Past Medical History, Past Surgical History, Family History, and Social History were reviewed in [employer] record.    Review of Systems        See HPI - all other systems neg except as noted... The patient complains of dyspnea on exertion, peripheral edema, muscle weakness, and difficulty walking.  The patient denies anorexia, fever, weight loss, weight gain, hoarseness, chest pain, syncope, prolonged cough, headaches, hemoptysis, abdominal pain, melena, hematochezia, severe indigestion/heartburn, hematuria, incontinence, suspicious skin lesions, transient blindness, depression, unusual weight change, abnormal bleeding, enlarged lymph nodes, and angioedema.     Objective:   Physical Exam     WD, overweight, 79 y/o BM in NAD...  GENERAL:  Alert, pleasant & cooperative;  VS- reviewed... HEENT:  [place]/AT, EOM- sl strabismus, PERRLA, EACs-clear (HOH), TMs-wnl, NOSE-clear discharge , THROAT-clear & wnl. NECK:  Supple w/ decrROM; no JVD; normal carotid impulses w/o bruits; no thyromegaly or nodules palpated; no lymphadenopathy. CHEST:  Clear to P & A; without wheezes/ rales/ or rhonchi heard;  Pacer palp in right chest... HEART:  Regular Rhythm; without murmurs/ rubs/ or gallops detected... ABDOMEN:  Scar of prev surg, soft & nontender; normal bowel sounds; no organomegaly or masses palpated...  EXT:  Mod-severe arthritic changes; no varicose veins/ +venous insuffic/ tr edema. NEURO:  CN's intact; motor testing normal; sensory testing inconsistant; gait is abnormal & balance only fair... DERM:  No lesions noted; no rash etc...  RADIOLOGY DATA:  Reviewed in the EPIC EMR & discussed w/ the patient...  LABORATORY DATA:  Reviewed in the EPIC EMR & discussed w/ the patient...   Assessment & Plan:    Hx upper resp  symptoms> all Harvey much resolved now & back to baseline...  HBP>  BP stable, continue same meds...  CAD, PACER>  He denies angina, palpit, syncope, etc;  Continue cardiology f/u w/ DrTomWall & Edward Harvey.  Divertics, Crohn's>  He has hx GI hemorrhage in past w/ part colectomy; Hx Crohn's colitis w/ diarrhea- improved on Lialda...  Gallstones>  He remains asymptomatic...  Prostate Ca>  Followed by Edward Harvey & stable...  DJD>  This is his main issue w/ mobility, self care, remaining independent...  Hx Subdural Hematoma>  Stable w/o acute problems...   Patient's Medications  New Prescriptions   No medications on file  Previous Medications   ALLOPURINOL (ZYLOPRIM) 300 MG TABLET    TAKE 1 TABLET BY MOUTH ONCE A DAY   ASPIRIN 81 MG CHEWABLE TABLET    Chew 81 mg by mouth daily.     BRIMONIDINE (ALPHAGAN P) 0.1 % SOLN    Apply 1 drop to eye 3 (three) times daily.     CHOLECALCIFEROL (VITAMIN D) 1000 UNITS CAPSULE    Take 1,000 Units by mouth daily.     FUROSEMIDE (LASIX) 20 MG TABLET    TAKE 1 TABLET BY MOUTH EVERY DAY   LATANOPROST (XALATAN) 0.005 % OPHTHALMIC SOLUTION    Place 1 drop into both eyes at bedtime.     MULTIPLE VITAMINS-MINERALS (CENTRUM SILVER PO)  Take 1 tablet by mouth daily.     QUINAPRIL (ACCUPRIL) 20 MG TABLET    Take 1 tablet (20 mg total) by mouth at bedtime.  Modified Medications   Modified Medication Previous Medication   CARVEDILOL (COREG) 6.25 MG TABLET carvedilol (COREG) 6.25 MG tablet      TAKE ONE TABLET BY MOUTH TWICE A DAY    TAKE ONE TABLET BY MOUTH TWICE A DAY   MESALAMINE (LIALDA) 1.2 G EC TABLET mesalamine (LIALDA) 1.2 G EC tablet      Take 1 tablet (1.2 g total) by mouth 2 (two) times daily.    Take 1 tablet (1.2 g total) by mouth 2 (two) times daily.   PROCTOSOL HC 2.5 % RECTAL CREAM hydrocortisone (ANUSOL-HC) 2.5 % rectal cream      USE AS DIRECTED RECTALLY TWICE A DAY *APPLY AFTER EACH BOWEL MOVEMENT    Place rectally 2 (two) times daily. Apply  after each BM   QUINAPRIL (ACCUPRIL) 20 MG TABLET quinapril (ACCUPRIL) 20 MG tablet      TAKE 1 TABLET BY MOUTH TWICE A DAY    TAKE 1 TABLET BY MOUTH TWICE A DAY   TAMSULOSIN HCL (FLOMAX) 0.4 MG CAPS Tamsulosin HCl (FLOMAX) 0.4 MG CAPS      Take 1 capsule (0.4 mg total) by mouth daily after supper.    Take 1 capsule (0.4 mg total) by mouth daily after supper.  Discontinued Medications   No medications on file

## 2012-06-19 ENCOUNTER — Other Ambulatory Visit: Payer: Self-pay | Admitting: Cardiology

## 2012-06-26 ENCOUNTER — Other Ambulatory Visit: Payer: Self-pay | Admitting: Gastroenterology

## 2012-06-26 DIAGNOSIS — M79609 Pain in unspecified limb: Secondary | ICD-10-CM | POA: Diagnosis not present

## 2012-06-26 DIAGNOSIS — B351 Tinea unguium: Secondary | ICD-10-CM | POA: Diagnosis not present

## 2012-06-30 ENCOUNTER — Telehealth: Payer: Self-pay | Admitting: Pulmonary Disease

## 2012-06-30 MED ORDER — TAMSULOSIN HCL 0.4 MG PO CAPS: 0.4000 mg | ORAL_CAPSULE | Freq: Every day | ORAL | Status: DC

## 2012-06-30 NOTE — Telephone Encounter (Signed)
Rx has been sent in, pt daughter is aware.

## 2012-07-17 ENCOUNTER — Ambulatory Visit: Payer: Medicare Other | Admitting: Gastroenterology

## 2012-07-20 ENCOUNTER — Telehealth: Payer: Self-pay | Admitting: Pulmonary Disease

## 2012-07-20 MED ORDER — MESALAMINE 1.2 G PO TBEC: 1.2000 g | DELAYED_RELEASE_TABLET | Freq: Two times a day (BID) | ORAL | Status: DC

## 2012-07-20 NOTE — Telephone Encounter (Signed)
Called, spoke with Autumn with [facility]. Requesting new rx for 90 days d/t pt's insurance.  Gave VO for Lialda for 90 day supply.  Autumn verbalized understanding and voiced no further questions or concerns at this time.

## 2012-07-24 ENCOUNTER — Encounter: Payer: Self-pay | Admitting: Gastroenterology

## 2012-07-24 ENCOUNTER — Ambulatory Visit (INDEPENDENT_AMBULATORY_CARE_PROVIDER_SITE_OTHER): Payer: Medicare Other | Admitting: Gastroenterology

## 2012-07-24 VITALS — BP 128/62 | HR 50 | Ht 73.0 in | Wt 217.0 lb

## 2012-07-24 DIAGNOSIS — K649 Unspecified hemorrhoids: Secondary | ICD-10-CM | POA: Diagnosis not present

## 2012-07-24 DIAGNOSIS — K5730 Diverticulosis of large intestine without perforation or abscess without bleeding: Secondary | ICD-10-CM

## 2012-07-24 DIAGNOSIS — K5010 Crohn's disease of large intestine without complications: Secondary | ICD-10-CM | POA: Diagnosis not present

## 2012-07-24 MED ORDER — HYDROCORTISONE 2.5 % RE CREA: TOPICAL_CREAM | RECTAL | Status: DC | PRN

## 2012-07-24 NOTE — Patient Instructions (Signed)
Please follow up as needed.  Lialda samples given please keep taking as you have been.    New prescription for your anal cream has been sent to your pharmacy

## 2012-07-24 NOTE — Progress Notes (Signed)
This is a outstanding 79 year old African American patient have seen for over 30 years.  He has segmental colitis associated with severe diverticulosis, and has had previous left colon resection.  He recently was placed on Lialda 2.4 g a day and has had marked improvement in his diarrhea and abdominal cramping.  He continues to complain of some burning in his rectum despite Analpram cream, but denies rectal pain, abdominal pain, or significant bleeding.  He follows a high fiber diet, and is maintained a normal weight.  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  He has multiple medical problems and cardiac problems as listed in detail by Dr. in a day including previous surgery for prostate cancer.  Current Medications, Allergies, Past Medical History, Past Surgical History, Family History and Social History were reviewed in [employer] record.  ROS: All systems were reviewed and are negative unless otherwise stated in the HPI.          Physical Exam: Healthy-appearing patient in no acute distress.  Low pressure 120/62, pulse 50 and regular, and weight 217 with a BMI of 28.64.  Oxygen saturation 88%.  His mental status is entirely normal.  His abdomen shows no organomegaly, masses, tenderness, or distention.  Bowel sounds are normal.  Inspection the rectum is unremarkable.  Anoscopy: This patient has 3 internal hemorrhoid columns which are nonbleeding and not thrombosed.  Anoscopic exam is otherwise unremarkable.  Rectal exam shows no masses or tenderness and guaiac-negative stool.    Assessment and Plan: This  Edward Harvey is doing extremely well on by mouth aminosalicylate therapy and local anal care.  Have asked him continue all of his medications and his high fiber diet as previously directed.  I see no need for further evaluation or treatment at this time of his gastrointestinal issues.  I spoke at length with his son Edward Harvey who is with the  patient today throughout the interview and exam.  We discussed dietary therapy, and I think it is prudent to eat whatever he likes at this stage in his life within reason, he does have mild lactose intolerance . No diagnosis found.

## 2012-07-24 NOTE — Addendum Note (Signed)
Addended by: Ok Anis A on: 07/24/2012 12:00 PM   Modules accepted: Orders

## 2012-08-03 DIAGNOSIS — H409 Unspecified glaucoma: Secondary | ICD-10-CM | POA: Diagnosis not present

## 2012-08-03 DIAGNOSIS — H4011X0 Primary open-angle glaucoma, stage unspecified: Secondary | ICD-10-CM | POA: Diagnosis not present

## 2012-08-06 ENCOUNTER — Telehealth: Payer: Self-pay | Admitting: Pulmonary Disease

## 2012-08-06 DIAGNOSIS — R269 Unspecified abnormalities of gait and mobility: Secondary | ICD-10-CM

## 2012-08-06 NOTE — Telephone Encounter (Signed)
Spoke with Olegario Messier, patients daughter. She states that that patient has been getting "stiff" x 3 weeks now. States patient complains about this all the time but will not attempt his bicycle. Olegario Messier is requesting physical therapy for patient. Dr. Kriste Basque please advise. Thank you!

## 2012-08-11 NOTE — Telephone Encounter (Signed)
Per SN---  Please request home PT/OT assessment for mobility, safety and exercise.

## 2012-08-11 NOTE — Telephone Encounter (Signed)
i spoke with [facility] and is aware. Will send order. Nothing further was needed

## 2012-08-24 ENCOUNTER — Telehealth: Payer: Self-pay | Admitting: Pulmonary Disease

## 2012-08-24 DIAGNOSIS — R269 Unspecified abnormalities of gait and mobility: Secondary | ICD-10-CM

## 2012-08-24 NOTE — Telephone Encounter (Signed)
Called and spoke with pts daughter cathy.  She stated that the pt was to be set up for OT/PT at home and she has not heard from anyone to set this up.  i advised cathy that we have sent the order in to [facility] on 3/11 but cathy has not been contacted to set this up.  Lynden Ang requested that another order be placed for this through another company.  Order has been placed and cathy to call me back if she does not hear from anyone about this therapy.

## 2012-08-26 DIAGNOSIS — H409 Unspecified glaucoma: Secondary | ICD-10-CM | POA: Diagnosis not present

## 2012-08-26 DIAGNOSIS — M1990 Unspecified osteoarthritis, unspecified site: Secondary | ICD-10-CM | POA: Diagnosis not present

## 2012-08-26 DIAGNOSIS — I10 Essential (primary) hypertension: Secondary | ICD-10-CM | POA: Diagnosis not present

## 2012-08-26 DIAGNOSIS — K5090 Crohn's disease, unspecified, without complications: Secondary | ICD-10-CM | POA: Diagnosis not present

## 2012-08-26 DIAGNOSIS — Z8719 Personal history of other diseases of the digestive system: Secondary | ICD-10-CM | POA: Diagnosis not present

## 2012-08-26 DIAGNOSIS — F0390 Unspecified dementia without behavioral disturbance: Secondary | ICD-10-CM | POA: Diagnosis not present

## 2012-08-26 DIAGNOSIS — I2510 Atherosclerotic heart disease of native coronary artery without angina pectoris: Secondary | ICD-10-CM | POA: Diagnosis not present

## 2012-08-26 DIAGNOSIS — H543 Unqualified visual loss, both eyes: Secondary | ICD-10-CM | POA: Diagnosis not present

## 2012-08-26 DIAGNOSIS — R262 Difficulty in walking, not elsewhere classified: Secondary | ICD-10-CM | POA: Diagnosis not present

## 2012-08-26 DIAGNOSIS — F411 Generalized anxiety disorder: Secondary | ICD-10-CM | POA: Diagnosis not present

## 2012-08-26 DIAGNOSIS — Z5189 Encounter for other specified aftercare: Secondary | ICD-10-CM | POA: Diagnosis not present

## 2012-08-26 DIAGNOSIS — Z8546 Personal history of malignant neoplasm of prostate: Secondary | ICD-10-CM | POA: Diagnosis not present

## 2012-08-26 DIAGNOSIS — M109 Gout, unspecified: Secondary | ICD-10-CM | POA: Diagnosis not present

## 2012-08-27 DIAGNOSIS — R262 Difficulty in walking, not elsewhere classified: Secondary | ICD-10-CM | POA: Diagnosis not present

## 2012-08-27 DIAGNOSIS — I2510 Atherosclerotic heart disease of native coronary artery without angina pectoris: Secondary | ICD-10-CM | POA: Diagnosis not present

## 2012-08-27 DIAGNOSIS — M1990 Unspecified osteoarthritis, unspecified site: Secondary | ICD-10-CM | POA: Diagnosis not present

## 2012-08-27 DIAGNOSIS — F0390 Unspecified dementia without behavioral disturbance: Secondary | ICD-10-CM | POA: Diagnosis not present

## 2012-08-27 DIAGNOSIS — H543 Unqualified visual loss, both eyes: Secondary | ICD-10-CM | POA: Diagnosis not present

## 2012-08-27 DIAGNOSIS — Z5189 Encounter for other specified aftercare: Secondary | ICD-10-CM | POA: Diagnosis not present

## 2012-09-01 DIAGNOSIS — H543 Unqualified visual loss, both eyes: Secondary | ICD-10-CM | POA: Diagnosis not present

## 2012-09-01 DIAGNOSIS — I2510 Atherosclerotic heart disease of native coronary artery without angina pectoris: Secondary | ICD-10-CM | POA: Diagnosis not present

## 2012-09-01 DIAGNOSIS — F0390 Unspecified dementia without behavioral disturbance: Secondary | ICD-10-CM | POA: Diagnosis not present

## 2012-09-01 DIAGNOSIS — Z5189 Encounter for other specified aftercare: Secondary | ICD-10-CM | POA: Diagnosis not present

## 2012-09-01 DIAGNOSIS — R262 Difficulty in walking, not elsewhere classified: Secondary | ICD-10-CM | POA: Diagnosis not present

## 2012-09-01 DIAGNOSIS — M1990 Unspecified osteoarthritis, unspecified site: Secondary | ICD-10-CM | POA: Diagnosis not present

## 2012-09-04 DIAGNOSIS — M1990 Unspecified osteoarthritis, unspecified site: Secondary | ICD-10-CM | POA: Diagnosis not present

## 2012-09-04 DIAGNOSIS — F0390 Unspecified dementia without behavioral disturbance: Secondary | ICD-10-CM | POA: Diagnosis not present

## 2012-09-04 DIAGNOSIS — I2510 Atherosclerotic heart disease of native coronary artery without angina pectoris: Secondary | ICD-10-CM | POA: Diagnosis not present

## 2012-09-04 DIAGNOSIS — Z5189 Encounter for other specified aftercare: Secondary | ICD-10-CM | POA: Diagnosis not present

## 2012-09-04 DIAGNOSIS — H543 Unqualified visual loss, both eyes: Secondary | ICD-10-CM | POA: Diagnosis not present

## 2012-09-04 DIAGNOSIS — R262 Difficulty in walking, not elsewhere classified: Secondary | ICD-10-CM | POA: Diagnosis not present

## 2012-09-06 ENCOUNTER — Observation Stay (HOSPITAL_COMMUNITY): Payer: Medicare Other

## 2012-09-06 ENCOUNTER — Encounter (HOSPITAL_COMMUNITY): Payer: Self-pay | Admitting: Emergency Medicine

## 2012-09-06 ENCOUNTER — Observation Stay (HOSPITAL_COMMUNITY)
Admission: EM | Admit: 2012-09-06 | Discharge: 2012-09-07 | Disposition: A | Discharge status: Home / Self Care | Payer: Medicare Other | Attending: Family Medicine | Admitting: Family Medicine

## 2012-09-06 DIAGNOSIS — Z79899 Other long term (current) drug therapy: Secondary | ICD-10-CM | POA: Insufficient documentation

## 2012-09-06 DIAGNOSIS — I442 Atrioventricular block, complete: Secondary | ICD-10-CM | POA: Diagnosis present

## 2012-09-06 DIAGNOSIS — R269 Unspecified abnormalities of gait and mobility: Secondary | ICD-10-CM

## 2012-09-06 DIAGNOSIS — M1990 Unspecified osteoarthritis, unspecified site: Secondary | ICD-10-CM

## 2012-09-06 DIAGNOSIS — I4891 Unspecified atrial fibrillation: Secondary | ICD-10-CM

## 2012-09-06 DIAGNOSIS — N289 Disorder of kidney and ureter, unspecified: Secondary | ICD-10-CM | POA: Diagnosis not present

## 2012-09-06 DIAGNOSIS — I10 Essential (primary) hypertension: Secondary | ICD-10-CM | POA: Diagnosis present

## 2012-09-06 DIAGNOSIS — E161 Other hypoglycemia: Secondary | ICD-10-CM | POA: Diagnosis not present

## 2012-09-06 DIAGNOSIS — I2510 Atherosclerotic heart disease of native coronary artery without angina pectoris: Secondary | ICD-10-CM

## 2012-09-06 DIAGNOSIS — R55 Syncope and collapse: Principal | ICD-10-CM | POA: Diagnosis present

## 2012-09-06 DIAGNOSIS — K5731 Diverticulosis of large intestine without perforation or abscess with bleeding: Secondary | ICD-10-CM

## 2012-09-06 DIAGNOSIS — R197 Diarrhea, unspecified: Secondary | ICD-10-CM

## 2012-09-06 DIAGNOSIS — Z950 Presence of cardiac pacemaker: Secondary | ICD-10-CM | POA: Diagnosis not present

## 2012-09-06 DIAGNOSIS — D72829 Elevated white blood cell count, unspecified: Secondary | ICD-10-CM | POA: Diagnosis not present

## 2012-09-06 DIAGNOSIS — K4090 Unilateral inguinal hernia, without obstruction or gangrene, not specified as recurrent: Secondary | ICD-10-CM

## 2012-09-06 DIAGNOSIS — F0390 Unspecified dementia without behavioral disturbance: Secondary | ICD-10-CM

## 2012-09-06 DIAGNOSIS — M109 Gout, unspecified: Secondary | ICD-10-CM

## 2012-09-06 DIAGNOSIS — Z8546 Personal history of malignant neoplasm of prostate: Secondary | ICD-10-CM

## 2012-09-06 DIAGNOSIS — R7301 Impaired fasting glucose: Secondary | ICD-10-CM | POA: Diagnosis not present

## 2012-09-06 DIAGNOSIS — J984 Other disorders of lung: Secondary | ICD-10-CM | POA: Diagnosis not present

## 2012-09-06 DIAGNOSIS — K5010 Crohn's disease of large intestine without complications: Secondary | ICD-10-CM

## 2012-09-06 DIAGNOSIS — J209 Acute bronchitis, unspecified: Secondary | ICD-10-CM

## 2012-09-06 DIAGNOSIS — Z87828 Personal history of other (healed) physical injury and trauma: Secondary | ICD-10-CM

## 2012-09-06 DIAGNOSIS — R7402 Elevation of levels of lactic acid dehydrogenase (LDH): Secondary | ICD-10-CM

## 2012-09-06 DIAGNOSIS — K56609 Unspecified intestinal obstruction, unspecified as to partial versus complete obstruction: Secondary | ICD-10-CM

## 2012-09-06 LAB — BASIC METABOLIC PANEL
BUN: 26 mg/dL — ABNORMAL HIGH (ref 6–23)
CO2: 23 mEq/L (ref 19–32)
Chloride: 109 mEq/L (ref 96–112)
Creatinine, Ser: 1.48 mg/dL — ABNORMAL HIGH (ref 0.50–1.35)
GFR calc Af Amer: 45 mL/min — ABNORMAL LOW (ref >90)
Glucose, Bld: 75 mg/dL (ref 70–99)
Potassium: 4.3 mEq/L (ref 3.5–5.1)

## 2012-09-06 LAB — URINE MICROSCOPIC-ADD ON

## 2012-09-06 LAB — CBC
HCT: 31 % — ABNORMAL LOW (ref 39.0–52.0)
Hemoglobin: 10.7 g/dL — ABNORMAL LOW (ref 13.0–17.0)
MCV: 96 fL (ref 78.0–100.0)
RDW: 14.7 % (ref 11.5–15.5)
WBC: 11.5 10*3/uL — ABNORMAL HIGH (ref 4.0–10.5)

## 2012-09-06 LAB — URINALYSIS, ROUTINE W REFLEX MICROSCOPIC
Bilirubin Urine: NEGATIVE
Glucose, UA: NEGATIVE mg/dL
Hgb urine dipstick: NEGATIVE
Ketones, ur: NEGATIVE mg/dL
Specific Gravity, Urine: 1.018 (ref 1.005–1.030)
pH: 5 (ref 5.0–8.0)

## 2012-09-06 LAB — TROPONIN I: Troponin I: <0.3 ng/mL (ref <0.30)

## 2012-09-06 MED ORDER — ACETAMINOPHEN 650 MG RE SUPP: 650.0000 mg | Freq: Four times a day (QID) | RECTAL | Status: DC | PRN

## 2012-09-06 MED ORDER — BRIMONIDINE TARTRATE 0.1 % OP SOLN: 1.0000 [drp] | Freq: Three times a day (TID) | OPHTHALMIC | Status: DC

## 2012-09-06 MED ORDER — DEXTROSE 5 % IV SOLN
1.0000 g | INTRAVENOUS | Status: DC
  Administered 2012-09-06: 1 g via INTRAVENOUS
  Filled 2012-09-06 (×3): qty 10

## 2012-09-06 MED ORDER — ASPIRIN 81 MG PO CHEW
81.0000 mg | CHEWABLE_TABLET | Freq: Every day | ORAL | Status: DC
  Administered 2012-09-06 – 2012-09-07 (×2): 81 mg via ORAL
  Filled 2012-09-06 (×2): qty 1

## 2012-09-06 MED ORDER — ONDANSETRON HCL 4 MG PO TABS: 4.0000 mg | ORAL_TABLET | Freq: Four times a day (QID) | ORAL | Status: DC | PRN

## 2012-09-06 MED ORDER — HEPARIN SODIUM (PORCINE) 5000 UNIT/ML IJ SOLN
5000.0000 [IU] | Freq: Three times a day (TID) | INTRAMUSCULAR | Status: DC
  Administered 2012-09-06 – 2012-09-07 (×2): 5000 [IU] via SUBCUTANEOUS
  Filled 2012-09-06 (×6): qty 1

## 2012-09-06 MED ORDER — ONDANSETRON HCL 4 MG/2ML IJ SOLN: 4.0000 mg | Freq: Four times a day (QID) | INTRAMUSCULAR | Status: DC | PRN

## 2012-09-06 MED ORDER — HYDROCORTISONE 2.5 % RE CREA
1.0000 | TOPICAL_CREAM | Freq: Every day | RECTAL | Status: DC | PRN
Start: 2012-09-06 — End: 2012-09-07

## 2012-09-06 MED ORDER — QUINAPRIL HCL 10 MG PO TABS: 20.0000 mg | ORAL_TABLET | Freq: Every evening | ORAL | Status: DC

## 2012-09-06 MED ORDER — SODIUM CHLORIDE 0.9 % IJ SOLN: 3.0000 mL | Freq: Two times a day (BID) | INTRAMUSCULAR | Status: DC

## 2012-09-06 MED ORDER — FUROSEMIDE 20 MG PO TABS
20.0000 mg | ORAL_TABLET | Freq: Two times a day (BID) | ORAL | Status: DC
  Administered 2012-09-06 – 2012-09-07 (×2): 20 mg via ORAL
  Filled 2012-09-06 (×5): qty 1

## 2012-09-06 MED ORDER — MESALAMINE 1.2 G PO TBEC
1.2000 g | DELAYED_RELEASE_TABLET | Freq: Two times a day (BID) | ORAL | Status: DC
  Administered 2012-09-06 – 2012-09-07 (×2): 1.2 g via ORAL
  Filled 2012-09-06 (×3): qty 1

## 2012-09-06 MED ORDER — BRIMONIDINE TARTRATE 0.2 % OP SOLN
1.0000 [drp] | Freq: Three times a day (TID) | OPHTHALMIC | Status: DC
  Administered 2012-09-06 – 2012-09-07 (×3): 1 [drp] via OPHTHALMIC
  Filled 2012-09-06 (×2): qty 5

## 2012-09-06 MED ORDER — ALLOPURINOL 300 MG PO TABS
300.0000 mg | ORAL_TABLET | Freq: Every day | ORAL | Status: DC
  Administered 2012-09-06 – 2012-09-07 (×2): 300 mg via ORAL
  Filled 2012-09-06 (×3): qty 1

## 2012-09-06 MED ORDER — SODIUM CHLORIDE 0.9 % IV SOLN
INTRAVENOUS | Status: AC
  Administered 2012-09-06: 21:00:00 via INTRAVENOUS

## 2012-09-06 MED ORDER — CARVEDILOL 6.25 MG PO TABS
6.2500 mg | ORAL_TABLET | Freq: Two times a day (BID) | ORAL | Status: DC
  Administered 2012-09-06 – 2012-09-07 (×2): 6.25 mg via ORAL
  Filled 2012-09-06 (×5): qty 1

## 2012-09-06 MED ORDER — ACETAMINOPHEN 325 MG PO TABS: 650.0000 mg | ORAL_TABLET | Freq: Four times a day (QID) | ORAL | Status: DC | PRN

## 2012-09-06 MED ORDER — LISINOPRIL 20 MG PO TABS
20.0000 mg | ORAL_TABLET | Freq: Every day | ORAL | Status: DC
  Administered 2012-09-06: 20 mg via ORAL
  Filled 2012-09-06 (×3): qty 1

## 2012-09-06 NOTE — ED Notes (Signed)
Gave patient fluids, pt drank without incident.

## 2012-09-06 NOTE — ED Notes (Addendum)
[place]; felt nauseated and felt hot; was unconscious for several minutes; CPR performed on scene due to inability for bystanders to find pulse. EMS arrived A&Ox3; no signs of distress. Pt has pacemaker.

## 2012-09-06 NOTE — ED Provider Notes (Signed)
Care assumed at the change of shift. Pt with witnessed syncope this AM. Back to baseline now. Pacemaker interrogation unremarkable. Admit for observation.   Poppi Scantling B. Bernette Mayers, MD 09/06/12 [phone]

## 2012-09-06 NOTE — ED Provider Notes (Signed)
History     CSN: [id number]  Arrival date & time 09/06/12  1232   First MD Initiated Contact with Patient 09/06/12 1247      Chief Complaint  Patient presents with  . Loss of Consciousness    (Consider location/radiation/quality/duration/timing/severity/associated sxs/prior treatment) Patient is a 79 y.o. male presenting with syncope. The history is provided by the patient.  Loss of Consciousness  Pertinent negatives include abdominal pain, back pain, chest pain, confusion, fever, headaches, palpitations, vomiting and weakness.  pt with hx av block, pacemaker, presents after syncopal event at church. Pt indicates was standing, got a transient cramping sensation in stomach, felt quesy, nauseated, then felt hot all over. Pt then felt lightheaded, those standing next to him eased him into seat. Brief loc.  Pt denies any current or recent cp or discomfort. No palpitations or sense of rapid or irregular heartbeat. Ate breakfast today. No vomiting or diarrhea. No current abd pain or discomfort. No dysuria or gu c/o. No rectal bleeding or melena. Denies recent change in meds. No fever or chills.     Past Medical History  Diagnosis Date  . Glaucoma(365)   . Acute bronchitis   . Hypertension   . CAD (coronary artery disease)     nuclear stress test 2006 inferoseptal and apical infarct ejection fraction 48%  . Atrioventricular block, complete   . Cardiac pacemaker in situ   . Diverticulosis of colon with hemorrhage   . Hemorrhage of gastrointestinal tract, unspecified   . Unspecified intestinal obstruction   . Inguinal hernia   . Gallstone   . Prostate cancer   . DJD (degenerative joint disease)   . Gout   . Hematoma     subdural    Past Surgical History  Procedure Laterality Date  . Pacemaker placement    . Cataract extraction, bilateral    . Tonsillectomy  age 41  . Subtotal colectomy  12/03    Dr. Ezzard Standing  . Lipoma excision  2001    left leg    No family history on  file.  History  Substance Use Topics  . Smoking status: Never Smoker   . Smokeless tobacco: Never Used  . Alcohol Use: Yes     Comment: Occ wine       Review of Systems  Constitutional: Negative for fever and chills.  HENT: Negative for neck pain.   Eyes: Negative for redness.  Respiratory: Negative for cough and shortness of breath.   Cardiovascular: Positive for syncope. Negative for chest pain, palpitations and leg swelling.  Gastrointestinal: Negative for vomiting, abdominal pain, diarrhea and anal bleeding.  Genitourinary: Negative for dysuria and flank pain.  Musculoskeletal: Negative for back pain.  Skin: Negative for rash.  Neurological: Negative for weakness, numbness and headaches.  Hematological: Does not bruise/bleed easily.  Psychiatric/Behavioral: Negative for confusion.    Allergies  Review of patient's allergies indicates no known allergies.  Home Medications   Current Outpatient Rx  Name  Route  Sig  Dispense  Refill  . allopurinol (ZYLOPRIM) 300 MG tablet      TAKE 1 TABLET BY MOUTH ONCE A DAY   90 tablet   3   . aspirin 81 MG chewable tablet   Oral   Chew 81 mg by mouth daily.           . brimonidine (ALPHAGAN P) 0.1 % SOLN   Ophthalmic   Apply 1 drop to eye 3 (three) times daily.           Marland Kitchen  carvedilol (COREG) 6.25 MG tablet      TAKE ONE TABLET BY MOUTH TWICE A DAY   180 tablet   2   . Cholecalciferol (VITAMIN D) 1000 UNITS capsule   Oral   Take 1,000 Units by mouth daily.           . furosemide (LASIX) 20 MG tablet      TAKE 1 TABLET BY MOUTH EVERY DAY   90 tablet   3   . hydrocortisone (PROCTOSOL HC) 2.5 % rectal cream   Rectal   Place rectally as needed for hemorrhoids.   28.35 g   2   . latanoprost (XALATAN) 0.005 % ophthalmic solution   Both Eyes   Place 1 drop into both eyes at bedtime.           . mesalamine (LIALDA) 1.2 G EC tablet   Oral   Take 1 tablet (1.2 g total) by mouth 2 (two) times daily.   180  tablet   3   . Multiple Vitamins-Minerals (CENTRUM SILVER PO)   Oral   Take 1 tablet by mouth daily.           . quinapril (ACCUPRIL) 20 MG tablet   Oral   Take 1 tablet (20 mg total) by mouth at bedtime.   180 tablet   2   . Tamsulosin HCl (FLOMAX) 0.4 MG CAPS   Oral   Take 1 capsule (0.4 mg total) by mouth daily after supper.   90 capsule   1     BP 115/51  Pulse 69  Temp(Src) 97.8 F (36.6 C) (Oral)  Resp 22  Ht 6' 1.5" (1.867 m)  Wt 217 lb (98.431 kg)  BMI 28.24 kg/m2  SpO2 98%  Physical Exam  Nursing note and vitals reviewed. Constitutional: He is oriented to person, place, and time. He appears well-developed and well-nourished. No distress.  HENT:  Head: Atraumatic.  Mouth/Throat: Oropharynx is clear and moist.  Eyes: Conjunctivae are normal. Pupils are equal, round, and reactive to light. No scleral icterus.  Neck: Neck supple. No tracheal deviation present.  No bruit  Cardiovascular: Normal rate, regular rhythm, normal heart sounds and intact distal pulses.   Pulmonary/Chest: Effort normal and breath sounds normal. No accessory muscle usage. No respiratory distress. He exhibits no tenderness.  Abdominal: Soft. Bowel sounds are normal. He exhibits no distension and no mass. There is no tenderness. There is no rebound and no guarding.  No puls mass  Genitourinary: Rectum normal.  Light brown stool.   Musculoskeletal: Normal range of motion. He exhibits no edema and no tenderness.  Neurological: He is alert and oriented to person, place, and time.  Motor intact bil.   Skin: Skin is warm and dry. He is not diaphoretic.  Psychiatric: He has a normal mood and affect.    ED Course  Procedures (including critical care time)     MDM  Iv ns. Monitor.    Date: 09/06/2012  Rate: 68  Rhythm: electronic ventricular pacemaker  QRS Axis: left  Intervals: paced  ST/T Wave abnormalities: paced  Conduction Disutrbances:paced  Narrative Interpretation:    Old EKG Reviewed: unchanged  Reviewed nursing notes and prior charts for additional history.   Po fluids. Ambulate in hall.   No faintness or dizziness.   Paced rhythm on monitor.             Suzi Roots, MD 09/09/12 2202

## 2012-09-06 NOTE — ED Notes (Signed)
Pt ambulated in hallway without incident.

## 2012-09-06 NOTE — H&P (Addendum)
[facility] History and Physical  Edward Harvey ZOX:[id number] DOB: 06-08-1915 DOA: 09/06/2012  Referring physician: er PCP: Michele Mcalpine, MD  Specialists:   Chief Complaint: syncope in church  HPI: Edward Harvey is a 79 y.o. male  Who is brought in by family after "passing out" in church.  Patient was sitting on the pew when he felt hot and lost consciousness.  He had a little bit of abdominal pain shortly before this episode.  No CP, No SOB.  He has had swelling in his LE that has slowly been worsening over the week.  He was able to eat breakfast this morning but his daughter )who he lives with) says his appetite has been decreased.  She has not noticed any foul smelling urine but has noticed a decrease in urine.    In the ER, his pacemaker was interrogated but no arrhythmias were found.  Labs were drawn but Cr was at baseline.  He did have a small bump in his WBCs.  I requested a chest x ray as CPR was reportedly performed at church as a pulse was not found.  He was ambulating in the ER and was back to baseline per family.  Hospitalist were asked to admit.  Review of Systems: all systems reviewed, negative unless stated above    Past Medical History  Diagnosis Date  . Glaucoma(365)   . Acute bronchitis   . Hypertension   . CAD (coronary artery disease)     nuclear stress test 2006 inferoseptal and apical infarct ejection fraction 48%  . Atrioventricular block, complete   . Cardiac pacemaker in situ   . Diverticulosis of colon with hemorrhage   . Hemorrhage of gastrointestinal tract, unspecified   . Unspecified intestinal obstruction   . Inguinal hernia   . Gallstone   . Prostate cancer   . DJD (degenerative joint disease)   . Gout   . Hematoma     subdural   Past Surgical History  Procedure Laterality Date  . Pacemaker placement    . Cataract extraction, bilateral    . Tonsillectomy  age 71  . Subtotal colectomy  12/03    Dr. Ezzard Standing  . Lipoma excision  2001     left leg   Social History:  reports that he has never smoked. He has never used smokeless tobacco. He reports that  drinks alcohol. He reports that he does not use illicit drugs.   No Known Allergies  Family Hx: CAD  Prior to Admission medications   Medication Sig Start Date End Date Taking? Authorizing Provider  allopurinol (ZYLOPRIM) 300 MG tablet Take 300 mg by mouth daily.   Yes Historical Provider, MD  aspirin 81 MG chewable tablet Chew 81 mg by mouth daily.    Yes Historical Provider, MD  brimonidine (ALPHAGAN P) 0.1 % SOLN Place 1 drop into both eyes 3 (three) times daily.    Yes Historical Provider, MD  carvedilol (COREG) 6.25 MG tablet Take 6.25 mg by mouth 2 (two) times daily with a meal.   Yes Historical Provider, MD  Cholecalciferol (VITAMIN D) 1000 UNITS capsule Take 1,000 Units by mouth daily.     Yes Historical Provider, MD  furosemide (LASIX) 20 MG tablet Take 20 mg by mouth 2 (two) times daily.   Yes Historical Provider, MD  hydrocortisone (ANUSOL-HC) 2.5 % rectal cream Place 1 application rectally daily as needed for hemorrhoids. 07/24/12  Yes Mardella Layman, MD  mesalamine (LIALDA) 1.2 G EC tablet  Take 1.2 g by mouth 2 (two) times daily. 07/20/12  Yes Michele Mcalpine, MD  Multiple Vitamins-Minerals (CENTRUM SILVER PO) Take 1 tablet by mouth daily.     Yes Historical Provider, MD  quinapril (ACCUPRIL) 20 MG tablet Take 20 mg by mouth every evening. 03/25/12  Yes Duke Salvia, MD  Tamsulosin HCl (FLOMAX) 0.4 MG CAPS Take 1 capsule (0.4 mg total) by mouth daily after supper. 06/30/12  Yes Michele Mcalpine, MD   Physical Exam: Filed Vitals:   09/06/12 1414 09/06/12 1415 09/06/12 1530 09/06/12 1545  BP: 138/80 117/82 116/69 119/69  Pulse: 78  64 64  Temp:      TempSrc:      Resp:  20 22 21   Height:      Weight:      SpO2:   100% 100%     General:  Pleasant/cooperative- mildly hard of hearing and mild cognitive slowness  Eyes: senile changes  ENT: wnl  Neck:  supple  Cardiovascular: rrr  Respiratory: clear anterior, no wheezing  Abdomen: +Bs, soft, NT  Skin: LE edema starting at mid shin to b/l ankles  Musculoskeletal: moves all 4 ext  Psychiatric: normal mood/affect  Neurologic: CN 2-12 intact  Labs on Admission:  Basic Metabolic Panel:  Recent Labs Lab 09/06/12 1325  NA 140  K 4.3  CL 109  CO2 23  GLUCOSE 75  BUN 26*  CREATININE 1.48*  CALCIUM 8.8   Liver Function Tests: No results found for this basename: AST, ALT, ALKPHOS, BILITOT, PROT, ALBUMIN,  in the last 168 hours No results found for this basename: LIPASE, AMYLASE,  in the last 168 hours No results found for this basename: AMMONIA,  in the last 168 hours CBC:  Recent Labs Lab 09/06/12 1325  WBC 11.5*  HGB 10.7*  HCT 31.0*  MCV 96.0  PLT 183   Cardiac Enzymes:  Recent Labs Lab 09/06/12 1325  TROPONINI <0.30    BNP (last 3 results) No results found for this basename: PROBNP,  in the last 8760 hours CBG: No results found for this basename: GLUCAP,  in the last 168 hours  Radiological Exams on Admission: No results found.  EKG: Independently reviewed. V paced  Assessment/Plan Principal Problem:   Syncope Active Problems:   HYPERTENSION   AV BLOCK, COMPLETE   Renal insufficiency   Leukocytosis   1. Syncope: cycle CE, gentle IVF for 12 hours- hold lasix for today (sunday)- I expect the patient had a vasovagal reaction but will await chest x ray and U/A- start rocephin for now, tele obs- hope for d/c in AM 2. leukocytsis- see above 3. Renal insuff- gentle IVF- seems to be at baseline    Code Status: full -discussed with family Family Communication: children at bedside Disposition Plan: home in AM?  Time spent: 70 min  Jadasia Haws [facility] Pager [phone]  If 7PM-7AM, please contact night-coverage [email or web address] Password [facility] 09/06/2012, 4:15 PM

## 2012-09-06 NOTE — ED Notes (Signed)
[facility] called, will be here in about 

## 2012-09-07 DIAGNOSIS — R55 Syncope and collapse: Secondary | ICD-10-CM | POA: Diagnosis not present

## 2012-09-07 DIAGNOSIS — N289 Disorder of kidney and ureter, unspecified: Secondary | ICD-10-CM | POA: Diagnosis not present

## 2012-09-07 DIAGNOSIS — Z950 Presence of cardiac pacemaker: Secondary | ICD-10-CM

## 2012-09-07 DIAGNOSIS — D72829 Elevated white blood cell count, unspecified: Secondary | ICD-10-CM | POA: Diagnosis not present

## 2012-09-07 LAB — BASIC METABOLIC PANEL
Chloride: 107 mEq/L (ref 96–112)
GFR calc non Af Amer: 44 mL/min — ABNORMAL LOW (ref >90)
Glucose, Bld: 93 mg/dL (ref 70–99)
Potassium: 3.7 mEq/L (ref 3.5–5.1)
Sodium: 137 mEq/L (ref 135–145)

## 2012-09-07 LAB — CBC
HCT: 29.7 % — ABNORMAL LOW (ref 39.0–52.0)
Hemoglobin: 10.3 g/dL — ABNORMAL LOW (ref 13.0–17.0)
MCH: 32.9 pg (ref 26.0–34.0)
RBC: 3.13 MIL/uL — ABNORMAL LOW (ref 4.22–5.81)

## 2012-09-07 MED ORDER — CEPHALEXIN 500 MG PO CAPS: 500.0000 mg | ORAL_CAPSULE | Freq: Two times a day (BID) | ORAL | Status: DC

## 2012-09-07 MED ORDER — CEPHALEXIN 500 MG PO CAPS
500.0000 mg | ORAL_CAPSULE | Freq: Two times a day (BID) | ORAL | Status: DC
  Filled 2012-09-07 (×2): qty 1

## 2012-09-07 NOTE — Discharge Summary (Signed)
Physician Discharge Summary  Edward Harvey:[id number] DOB: 03/11/1916 DOA: 09/06/2012  PCP: Michele Mcalpine, MD  Admit date: 09/06/2012 Discharge date: 09/07/2012  Time spent: > 35 minutes  Recommendations for Outpatient Follow-up:  Reevaluate fluid status and diuretic dosing and frequency given recent presentation to the hospital after syncope supected to be 2ary to decrease intravascular volume.  Discharge Diagnoses:  Principal Problem:   Syncope Active Problems:   HYPERTENSION   AV BLOCK, COMPLETE   Renal insufficiency   Leukocytosis   Discharge Condition: stable  Diet recommendation: heart healthy  Filed Weights   09/06/12 1245 09/06/12 1646  Weight: 98.431 kg (217 lb) 96.616 kg (213 lb)    History of present illness:  79 y/o with h/o HTN, glaucoma, CAD, and cardiac pacemaker in situ presenting to the ED after syncope in church.  Hospital Course:  1. Syncope: Cardiac enzymes x 3 negative, patient has been rehydrated and orthostatic vitals WNL's.  Most likely was due to poor oral intake prior to presentation and decreased intravascular volume.  No red flags on telemetry and pacemaker interrogated reportedly with no abnormalities reported. 2. leukocytsis- Resolved.  Will prescribe Keflex for 6 more days to complete a treatment regimen of 7 days total with keflex for uncomplicated UTI.  3. Renal insuff- Improved after IVF rehydration.  Patient to follow up with PCP for further evaluation and recommendations.  Procedures:  None  Consultations:  none  Discharge Exam: Filed Vitals:   09/07/12 0500 09/07/12 1013 09/07/12 1016 09/07/12 1020  BP: 118/68 112/70 121/74 120/79  Pulse: 83 61 61 62  Temp: 98.3 F (36.8 C) 97 F (36.1 C) 97 F (36.1 C) 97 F (36.1 C)  TempSrc: Oral Oral    Resp: 18 18 18 18   Height:      Weight:      SpO2: 100% 97% 96% 97%    General: Pt in NAD, Alert and Awake Cardiovascular: RRR, no MRG Respiratory: CTA BL, no wheezes, no  increased work of breathing.  Discharge Instructions  Discharge Orders   Future Appointments Provider Department Dept Phone   09/09/2012 3:00 PM [facility] Main [facility] [place]) [phone]   Future Orders Complete By Expires     Call MD for:  severe uncontrolled pain  As directed     Call MD for:  temperature >100.4  As directed     Diet - low sodium heart healthy  As directed     Discharge instructions  As directed     Comments:      Please be sure to follow up with your primary care physician in 1-2 weeks or sooner should any new concerns arise.    Increase activity slowly  As directed         Medication List    TAKE these medications       allopurinol 300 MG tablet  Commonly known as:  ZYLOPRIM  Take 300 mg by mouth daily.     aspirin 81 MG chewable tablet  Chew 81 mg by mouth daily.     brimonidine 0.1 % Soln  Commonly known as:  ALPHAGAN P  Place 1 drop into both eyes 3 (three) times daily.     carvedilol 6.25 MG tablet  Commonly known as:  COREG  Take 6.25 mg by mouth 2 (two) times daily with a meal.     CENTRUM SILVER PO  Take 1 tablet by mouth daily.     cephALEXin 500 MG capsule  Commonly known as:  KEFLEX  Take 1 capsule (500 mg total) by mouth 2 (two) times daily.     furosemide 20 MG tablet  Commonly known as:  LASIX  Take 20 mg by mouth 2 (two) times daily.     hydrocortisone 2.5 % rectal cream  Commonly known as:  ANUSOL-HC  Place 1 application rectally daily as needed for hemorrhoids.     mesalamine 1.2 G EC tablet  Commonly known as:  LIALDA  Take 1.2 g by mouth 2 (two) times daily.     quinapril 20 MG tablet  Commonly known as:  ACCUPRIL  Take 20 mg by mouth every evening.     tamsulosin 0.4 MG Caps  Commonly known as:  FLOMAX  Take 1 capsule (0.4 mg total) by mouth daily after supper.     Vitamin D 1000 UNITS capsule  Take 1,000 Units by mouth daily.          The results of significant diagnostics  from this hospitalization (including imaging, microbiology, ancillary and laboratory) are listed below for reference.    Significant Diagnostic Studies: Dg Chest 2 View  09/06/2012  *RADIOLOGY REPORT*  Clinical Data: Syncope.  Leukocytosis.  CHEST - 2 VIEW  Comparison: 10/03/2011  Findings: AP and lateral view of the chest demonstrates heart size may upper normal range along with tortuosity of the thoracic aorta. Dual lead pacer remains in place.  Stable scarring noted along the left hemidiaphragm.  Thoracic spondylosis is present.  IMPRESSION:  1.  Stable scarring along the left hemidiaphragm. 2.  Tortuous aorta.   Original Report Authenticated By: Gaylyn Rong, M.D.     Microbiology: No results found for this or any previous visit (from the past 240 hour(s)).   Labs: Basic Metabolic Panel:  Recent Labs Lab 09/06/12 1325 09/07/12 0600  NA 140 137  K 4.3 3.7  CL 109 107  CO2 23 24  GLUCOSE 75 93  BUN 26* 23  CREATININE 1.48* 1.33  CALCIUM 8.8 8.6   Liver Function Tests: No results found for this basename: AST, ALT, ALKPHOS, BILITOT, PROT, ALBUMIN,  in the last 168 hours No results found for this basename: LIPASE, AMYLASE,  in the last 168 hours No results found for this basename: AMMONIA,  in the last 168 hours CBC:  Recent Labs Lab 09/06/12 1325 09/07/12 0600  WBC 11.5* 7.4  HGB 10.7* 10.3*  HCT 31.0* 29.7*  MCV 96.0 94.9  PLT 183 164   Cardiac Enzymes:  Recent Labs Lab 09/06/12 1325 09/06/12 1657 09/06/12 2201 09/07/12 0600  TROPONINI <0.30 <0.30 <0.30 <0.30   BNP: BNP (last 3 results) No results found for this basename: PROBNP,  in the last 8760 hours CBG: No results found for this basename: GLUCAP,  in the last 168 hours     Signed:  Penny Pia  [facility] 09/07/2012, 10:33 AM

## 2012-09-07 NOTE — Progress Notes (Signed)
NCM received call from [facility] and pt was active prior to admission with RN and PT. HH resumption of care orders received. Isidoro Donning RN CCM Case Mgmt phone [phone]

## 2012-09-07 NOTE — Progress Notes (Signed)
Pt provided with dc instructions and education. Pt verbalized understanding. No questions at this time. VSS. IV removed with tip intact. Heart monitor cleaned andr eturned to front. Levonne Spiller, RN

## 2012-09-08 ENCOUNTER — Encounter: Payer: Self-pay | Admitting: Pulmonary Disease

## 2012-09-08 ENCOUNTER — Ambulatory Visit (INDEPENDENT_AMBULATORY_CARE_PROVIDER_SITE_OTHER): Payer: Medicare Other | Admitting: Pulmonary Disease

## 2012-09-08 VITALS — BP 140/78 | HR 63 | Temp 97.2°F | Ht 73.0 in | Wt 224.0 lb

## 2012-09-08 DIAGNOSIS — I2510 Atherosclerotic heart disease of native coronary artery without angina pectoris: Secondary | ICD-10-CM | POA: Diagnosis not present

## 2012-09-08 DIAGNOSIS — I4891 Unspecified atrial fibrillation: Secondary | ICD-10-CM | POA: Diagnosis not present

## 2012-09-08 DIAGNOSIS — M1990 Unspecified osteoarthritis, unspecified site: Secondary | ICD-10-CM

## 2012-09-08 DIAGNOSIS — K5731 Diverticulosis of large intestine without perforation or abscess with bleeding: Secondary | ICD-10-CM

## 2012-09-08 DIAGNOSIS — R269 Unspecified abnormalities of gait and mobility: Secondary | ICD-10-CM

## 2012-09-08 DIAGNOSIS — R55 Syncope and collapse: Secondary | ICD-10-CM

## 2012-09-08 DIAGNOSIS — Z8546 Personal history of malignant neoplasm of prostate: Secondary | ICD-10-CM

## 2012-09-08 DIAGNOSIS — I10 Essential (primary) hypertension: Secondary | ICD-10-CM | POA: Diagnosis not present

## 2012-09-08 DIAGNOSIS — N289 Disorder of kidney and ureter, unspecified: Secondary | ICD-10-CM

## 2012-09-08 DIAGNOSIS — J209 Acute bronchitis, unspecified: Secondary | ICD-10-CM

## 2012-09-08 DIAGNOSIS — F0390 Unspecified dementia without behavioral disturbance: Secondary | ICD-10-CM

## 2012-09-08 DIAGNOSIS — Z950 Presence of cardiac pacemaker: Secondary | ICD-10-CM

## 2012-09-08 DIAGNOSIS — K5010 Crohn's disease of large intestine without complications: Secondary | ICD-10-CM

## 2012-09-08 DIAGNOSIS — M109 Gout, unspecified: Secondary | ICD-10-CM

## 2012-09-08 DIAGNOSIS — I442 Atrioventricular block, complete: Secondary | ICD-10-CM

## 2012-09-08 NOTE — Patient Instructions (Addendum)
Today we updated your med list in our EPIC system...    Continue your current medications the same...  We decided to cut the Lasix (Furosemide) back to one 20mg  tab each AM...    Remember to eliminate the SALT/ Sodium from your diet...  We will arrange for a Cariology follow up appt as well...  Call for any questions...  Let's plan a follow up visit in 67mo, sooner if needed for problems.Marland KitchenMarland Kitchen

## 2012-09-08 NOTE — Progress Notes (Signed)
Subjective:    Patient ID: Edward Harvey, male    DOB: January 25, 1916, 79 y.o.   MRN: [id number]  HPI 79 y/o BM here for a follow up visit... he has multiple medical problems including HBP, CAD, & complete heart block w/ pacer followed by DrWall & DrKlein;  hx of GI bleed from divertics w/ subtotal colectomy in 2003 & followed by DrPatterson;  hx SBO, inguinal hernias, gallstones;  hx prostate cancer followed by DrOttelin;  DJD & Gout;  hx subdural hematoma prev requiring burr hole, & mild senile dementia...  ~  June 03, 2011:  8mo ROV & he says "getting older & slower" but notes that he & his wife will celebrate their 72nd Christian Mate this coming yr...     AB> no recent URI or breathing prob reported...    HBP> on ASA81, Coreg6.25Bid, Quinapril20Bid, Lasix20; wt is down 5# on diet to 223#; BP= 116/78...    CAD/ AVblock/ Pacer> on meds above; he denies CP, palpit, ch in dyspnea or edema...    Divertics w/ hx hemorrhage/ Hx SBO> he denies abd pain, N/V, D/C, ch in bowels or blood seen...    Hx Prostate Cancer> followed by DrOttelin but we don't have recent notes...     DJD/ Gout> on Allopurinol300, OTC analgesics prn & MVI/ Vit D supplement;     Hx subdural hematoma> required burr holes back then; no known sequellae & he hasn't been falling...  ~  Oct 03, 2011:  8mo ROV & Edward Harvey is here w/ his son today- his wife passed away 2022-09-01;  He appears to be doing ok- "just older & slower" as he's said before; BP controlled, breathing is ok, denies CP etc;  See prob list below>> CXR 5/13 showed stable heart size & pacer, sl hyperinflation, clear lungs, DJD spine, NAD... LABS 5/13:  Chems- ok w/ BUN=25 Creat=1.4 Uric=5.8 on Allopurinol...  ~  February 05, 2012:  8mo ROV & Edward Harvey denies any problems other than "getting older"; he notes the vision in his left eye is getting worse, on 3 diff eye drops, & he stopped driving 2/95 (Dr Nile Riggs sent him to [facility] for 2nd opinion);  BP is well controlled on Coreg,  Quinapril, & Lasix;  He denies CP, palpit, ch in SOB or edema;  GI is ok w/o recent problems, but his urine in slower & we discussed trial FLOMAX 0.4mg  Qhs;  Overall he is quite remarkable for 79 y/o... We reviewed prob list, meds, xrays and labs> see below for updates >>  ~  March 19, 2012:  Add-on at pt request> he saw TP 02/26/12 c/o hoarseness & bad taste, phlegm in throat, right lower rib cage discomfort; exam was neg & he was rec to take Claritin5mg , nasal saline, heating pad to ribs , and Advil prn;  He reports that his symptoms have all abated, feeling back to baseline, "just older & slower" he reiterates...  We reviewed prob list, meds, xrays and labs> see below for updates >>   ~  June 09, 2102:  82mo ROV & [name] indicates he had some diarrhea, saw DrPatterson & now better w/ addition of Lialda 1.2gmBid for Crohn's (but he thinks it was salads); We reviewed the following medical problems during today's office visit>>     AB> no recent URI or breathing prob reported...    HBP> on ASA81, Coreg6.25Bid, Quinapril20, Lasix20; wt is up 8# on diet to 225#; BP= 118/70...    CAD/ AVblock/ Pacer> on meds above;  he denies CP, palpit, ch in dyspnea or edema...    Divertics w/ hx hemorrhage/ Hx SBO> he denies abd pain, N/V/C; recent diarrhea improved w/ Lialda.    Hx Prostate Cancer> followed by DrOttelin but we don't have recent notes...     DJD/ Gout> on Allopurinol300, OTC analgesics prn & MVI/ Vit D supplement;     Hx subdural hematoma> required burr holes back then; no known sequellae & he hasn't been falling... We reviewed prob list, meds, xrays and labs> see below for updates >>  LABS 1/14:  Chems- wnl w/ Creat=1.4 Alb=3.0;  CBC- ok w/ Hg=11.9;  TSH=1.76;  Sed=38... rec attn to diet & continue Lialda.  ~  September 08, 2012:  22mo ROV & post hosp check> He was [facility] 4/6-7/14 after he got too hot in church he says; DCSummary indicates syncope assoc w/ BUN~25, Cr~1.4, Hg~10, CXR- NAD; given IVF &  improved, no postural BP changes; he knows to avoid sodium & is taking Lasix20/d... Feels he is back to baseline- wants to restart his PT w/ [facility]- ok... We reviewed the following medical problems during today's office visit >>     AB> no recent URI or breathing prob reported...    HBP> on ASA81, Coreg6.25Bid, Quinapril20, Lasix20; wt is down 1# on diet to 224#; BP= 140/78 & he feels he is stable...    CAD/ AVblock/ Pacer> on meds above; he denies CP, palpit, ch in dyspnea or edema...    Divertics w/ hx hemorrhage/ Hx SBO> he denies abd pain, N/V/C; recent diarrhea improved w/ Lialda1.2Bid    Hx Prostate Cancer> on Flomax0.4; followed by DrOttelin but we don't have recent notes...     DJD/ Gout> on Allopurinol300, OTC analgesics prn & MVI/ Vit D supplement;     Hx subdural hematoma> required burr holes back then; no known sequellae & he hasn't been falling... We reviewed prob list, meds, xrays and labs> see below for updates >>         Problem List:    GLAUCOMA (ICD-365.9) - prev followed by Deland Pretty w/ bilat cataract surg & glaucoma on eye 3 diff drops as noted...  HEARING LOSS - son says he has 3 sets of hearing aides but won't wear any of them...  Hx of ASTHMATIC BRONCHITIS, ACUTE (ICD-466.0) - he has never smoked... hx recurrent bronchitic infections over the yrs w/ reactive airway component... no recent prob, and no regular meds required.  ~  CXR 5/13 showed stable hrt size & pacer, clear lungs, degen changes in Tspine, NAD.Marland Kitchen. ~  CXR 4/14 showed heart at upper lim normal, totuous Ao, pacer, clear lungs/ stable scarring at left base/ NAD...  HYPERTENSION (ICD-401.9) - on ASA 81mg /d, COREG 6.25Bid, QUINAPRIL 20mg Bid, LASIX 20mg /d... He takes his own meds & states no problems. ~  4/12:  BP= 110/74 & feeling well... he denies HA, visual changes, CP, palipit, dizziness, syncope, change in dyspnea, etc... ~  8/12:  BP= 110/68 & continues stable... ~  12/12:  BP= 116/78 & he remains  asymptomatic... ~  5/13:  BP= 126/80 & he continues to deny CP, palpit, SOB, edema, etc... ~  9/13:  BP= 126/78 & he denies CP, palpit, ch in SOB or edema... ~  10/13:  BP= 102/66 & he remains largely asymptomatic... ~  1/14:  BP= 118/70 & he continues to deny CP, palpit, ch in SOB/ edema... ~  4/14: on ASA81, Coreg6.25Bid, Quinapril20, Lasix20; wt is down 1# on diet to 224#; BP= 140/78 &  he feels he is stable.  CORONARY ARTERY DISEASE (ICD-414.00) - followed by DrWall for Cardiology on above meds... pt has not had a prev cath... ~  2DEcho 4/05 showed mild conc LVH and norm LVF- improved from 2002 when EF= 45%... ~  NuclearStressTest 10/06 showed prior inferoseptal & apical infarct w/ apical AK, w/o ischemia, EF= 48%... no change from prev.  Hx of AV BLOCK, COMPLETE (ICD-426.0), & CARDIAC PACEMAKER IN SITU (ICD-V45.01) - he had a pacemaker placed for complete heart block initially in 1994, & this was exchanged for a new dual chamber pacer: Valene Bors DDDR model [phone] by [facility] 6/06... ~  seen by DrKlein 1/10 w/ pacer check OK, no further episodes of AFib on his monitor... not a Coumadin candidate due to age, unsteady, hx of GI bleeds, and prev subdural hematoma. ~  followed regularly by DrKlein's pacer clinic (last note 10/12 reviewed)> stable, doing well, no changes made.  DIVERTICULOSIS OF COLON WITH HEMORRHAGE (ICD-562.12)  GASTROINTESTINAL HEMORRHAGE (ICD-578.9) - he had a subtotal colectomy w/ ileum to distal sigmoid anastomosis in Jul03 by DrNewman... ~  EGD 8/03 by Dorris Singh was normal... ~  colonoscopy 1/08 by DrPatterson showed inflammed mucosa in sm bowel prox to the ileo-colonic anastomosis, few divertics in remaining distal colon, no polyps etc...  ~  f/u colonoscopy 5/09 showed prev colectomy- anastomosis granular & bleeding ?Crohn's... also had divertics & hems... Rx'd w/ Lialda.  CROHN'S COLITIS >>  Hx of SMALL BOWEL OBSTRUCTION (ICD-560.9) > see [id number] Hospitalization by  [facility]... ~  Abd Sonar 4/11 showed mult gallstones in GB, echogenic renal parenchyma c/w medical renal dis... ~  CT Abd 4/11 showed mild atx at bases, sl GB [place] thickening & ductal dilatation (known gallstones), mult cysts in spleen & kidneys, ectatic ao, bilat fat filled inguinal hernias, degen changes in spine... ~  12/13: he had f/u DrPatterson & note reviewed> hx segmental colitis assoc w/ severe diverticulosis & prev left colon resection; restarted LIALDA 1.2gm Bid for diarrhea & improved...  INGUINAL HERNIA (ICD-550.90) - he has bilat fat containing inguinal hernias seen on CT Abd in 2008...   GALLSTONES (ICD-574.20) - Sonar 4/11 showed mult gallstones filling the gallbladder, and CT Abd 4/11 showed mild extrahep biliary ductal dilatation... he was evaluated by DrPatterson/ GI, DrNewman/ Surg, DrWall/ Cards> all agreed that risk was hi & to hold off on surg unless absolutely necessary... ~  4/12 & 8/12:  He remains asymptomatic w/o abd pain, N/V, etc...  RENAL INSUFFICIENCY >> Creat in the 1.4 - 1.5 range... CARCINOMA, PROSTATE, HX OF (ICD-V10.46) - diagnosed in 1993 & treated w/ XRT... PSA initially ~29 and dropped to ~1 after XRT & it has remained there ever since... followed by DrOttelin- also has BPH, min obstructive symptoms, bilat hydroceles & some benign renal cysts... ~  labs 3/07 by Urology showed PSA= 1.19 ~  labs here 1/10 showed PSA= 1.37 ~  5/11: he reports f/u DrOttelin w/ incontinence symptoms Rx'd [place] XR 60mg /d & improved, so he stopped this on his own & states that he is doing satis now... ~  He continues to f/u w/ DrOttelin regularly- we do not have recent notes from Urology... ~  9/13:  He notes slower urinary stream & we decided to try [facility] 0.4mg Qhs... ~  Labs 1/14 showed BUN= 23, creat= 1.4  DEGENERATIVE JOINT DISEASE (ICD-715.90) >> Hx of GOUT (ICD-274.9) >> he has hx of DJD and Gout treated w/ TRAMADOL Prn & ALLOPURINOL 300mg /d; also takes MVI & VIT  D 1000 u  daily... ~  Labs 5/13 showed Uric= 5.8  HEMATOMA, SUBDURAL (ICD-432.1) - he had a burr hole placed for subdural hematoma in the past...    Past Surgical History  Procedure Laterality Date  . Pacemaker placement    . Cataract extraction, bilateral    . Tonsillectomy  age 74  . Subtotal colectomy  12/03    Dr. Ezzard Standing  . Lipoma excision  2001    left leg    Outpatient Encounter Prescriptions as of 09/08/2012  Medication Sig Dispense Refill  . allopurinol (ZYLOPRIM) 300 MG tablet Take 300 mg by mouth daily.      Marland Kitchen aspirin 81 MG chewable tablet Chew 81 mg by mouth daily.       . brimonidine (ALPHAGAN P) 0.1 % SOLN Place 1 drop into both eyes 3 (three) times daily.       . carvedilol (COREG) 6.25 MG tablet Take 6.25 mg by mouth 2 (two) times daily with a meal.      . cephALEXin (KEFLEX) 500 MG capsule Take 1 capsule (500 mg total) by mouth 2 (two) times daily.  12 capsule  0  . Cholecalciferol (VITAMIN D) 1000 UNITS capsule Take 1,000 Units by mouth daily.        . furosemide (LASIX) 20 MG tablet Take 20 mg by mouth 2 (two) times daily.      . hydrocortisone (ANUSOL-HC) 2.5 % rectal cream Place 1 application rectally daily as needed for hemorrhoids.      . mesalamine (LIALDA) 1.2 G EC tablet Take 1.2 g by mouth 2 (two) times daily.      . Multiple Vitamins-Minerals (CENTRUM SILVER PO) Take 1 tablet by mouth daily.        . quinapril (ACCUPRIL) 20 MG tablet Take 20 mg by mouth every evening.      . Tamsulosin HCl (FLOMAX) 0.4 MG CAPS Take 1 capsule (0.4 mg total) by mouth daily after supper.  90 capsule  1   No facility-administered encounter medications on file as of 09/08/2012.    No Known Allergies   Current Medications, Allergies, Past Medical History, Past Surgical History, Family History, and Social History were reviewed in [employer] record.    Review of Systems        See HPI - all other systems neg except as noted... The patient complains of dyspnea  on exertion, peripheral edema, muscle weakness, and difficulty walking.  The patient denies anorexia, fever, weight loss, weight gain, hoarseness, chest pain, syncope, prolonged cough, headaches, hemoptysis, abdominal pain, melena, hematochezia, severe indigestion/heartburn, hematuria, incontinence, suspicious skin lesions, transient blindness, depression, unusual weight change, abnormal bleeding, enlarged lymph nodes, and angioedema.     Objective:   Physical Exam     WD, overweight, 79 y/o BM in NAD...  GENERAL:  Alert, pleasant & cooperative;  VS- reviewed... HEENT:  [place]/AT, EOM- sl strabismus, PERRLA, EACs-clear (HOH), TMs-wnl, NOSE-clear discharge , THROAT-clear & wnl. NECK:  Supple w/ decrROM; no JVD; normal carotid impulses w/o bruits; no thyromegaly or nodules palpated; no lymphadenopathy. CHEST:  Clear to P & A; without wheezes/ rales/ or rhonchi heard;  Pacer palp in right chest... HEART:  Regular Rhythm; without murmurs/ rubs/ or gallops detected... ABDOMEN:  Scar of prev surg, soft & nontender; normal bowel sounds; no organomegaly or masses palpated...  EXT:  Mod-severe arthritic changes; no varicose veins/ +venous insuffic/ tr edema. NEURO:  CN's intact; motor testing normal; sensory testing inconsistant; gait is abnormal &  balance only fair... DERM:  No lesions noted; no rash etc...  RADIOLOGY DATA:  Reviewed in the EPIC EMR & discussed w/ the patient...  LABORATORY DATA:  Reviewed in the EPIC EMR & discussed w/ the patient...   Assessment & Plan:    HBP>  BP stable, continue same meds...  CAD, PACER>  He denies angina, palpit, syncope, etc;  Continue cardiology f/u w/ DrTomWall & DrKlein.  Divertics, Crohn's>  He has hx GI hemorrhage in past w/ part colectomy; Hx Crohn's colitis w/ diarrhea- improved on Lialda...  Gallstones>  He remains asymptomatic...  Prostate Ca>  Followed by DrOttelin & stable...  DJD>  This is his main issue w/ mobility, self care, remaining  independent...  Hx Subdural Hematoma>  Stable w/o acute problems...   Patient's Medications  New Prescriptions   No medications on file  Previous Medications   ALLOPURINOL (ZYLOPRIM) 300 MG TABLET    Take 300 mg by mouth daily.   ASPIRIN 81 MG CHEWABLE TABLET    Chew 81 mg by mouth daily.    BRIMONIDINE (ALPHAGAN P) 0.1 % SOLN    Place 1 drop into both eyes 3 (three) times daily.    CARVEDILOL (COREG) 6.25 MG TABLET    Take 6.25 mg by mouth 2 (two) times daily with a meal.   CEPHALEXIN (KEFLEX) 500 MG CAPSULE    Take 1 capsule (500 mg total) by mouth 2 (two) times daily.   CHOLECALCIFEROL (VITAMIN D) 1000 UNITS CAPSULE    Take 1,000 Units by mouth daily.     FUROSEMIDE (LASIX) 20 MG TABLET    Take 20 mg by mouth 2 (two) times daily.   HYDROCORTISONE (ANUSOL-HC) 2.5 % RECTAL CREAM    Place 1 application rectally daily as needed for hemorrhoids.   MESALAMINE (LIALDA) 1.2 G EC TABLET    Take 1.2 g by mouth 2 (two) times daily.   MULTIPLE VITAMINS-MINERALS (CENTRUM SILVER PO)    Take 1 tablet by mouth daily.     QUINAPRIL (ACCUPRIL) 20 MG TABLET    Take 20 mg by mouth every evening.   TAMSULOSIN HCL (FLOMAX) 0.4 MG CAPS    Take 1 capsule (0.4 mg total) by mouth daily after supper.  Modified Medications   No medications on file  Discontinued Medications   No medications on file

## 2012-09-09 DIAGNOSIS — F0390 Unspecified dementia without behavioral disturbance: Secondary | ICD-10-CM | POA: Diagnosis not present

## 2012-09-09 DIAGNOSIS — H543 Unqualified visual loss, both eyes: Secondary | ICD-10-CM | POA: Diagnosis not present

## 2012-09-09 DIAGNOSIS — R262 Difficulty in walking, not elsewhere classified: Secondary | ICD-10-CM | POA: Diagnosis not present

## 2012-09-09 DIAGNOSIS — M1990 Unspecified osteoarthritis, unspecified site: Secondary | ICD-10-CM | POA: Diagnosis not present

## 2012-09-09 DIAGNOSIS — I2510 Atherosclerotic heart disease of native coronary artery without angina pectoris: Secondary | ICD-10-CM | POA: Diagnosis not present

## 2012-09-09 DIAGNOSIS — Z5189 Encounter for other specified aftercare: Secondary | ICD-10-CM | POA: Diagnosis not present

## 2012-09-11 ENCOUNTER — Telehealth: Payer: Self-pay | Admitting: Pulmonary Disease

## 2012-09-11 NOTE — Telephone Encounter (Signed)
Per SN---   He is not a new pt , he has been his doctor for years but if he needs a new doctor then SN recs for Dr. Shirlee Latch.   Attempted to call cathey to make her aware but no answer and unable to leave a message. Will try back Monday.

## 2012-09-11 NOTE — Telephone Encounter (Signed)
Called, spoke with [place].  Pt was seen by SN on 09/08/12; order placed for:  Note Pt. Needs appointment with cardiology for eval of syncope and cad. Pt. Has seen dr. Daleen Squibb  ----  Edward Harvey states Dr. Daleen Squibb isn't taking new pts now.  She would like to know who else SN recs pt see.  Pls advise.  Thank you.

## 2012-09-14 NOTE — Telephone Encounter (Signed)
ATC - NA - Will try back later 

## 2012-09-15 NOTE — Telephone Encounter (Signed)
Called home # NA Called mobile # [id number] x 1

## 2012-09-16 NOTE — Telephone Encounter (Signed)
Cathy returned call. Kathleen W Perdue °

## 2012-09-16 NOTE — Telephone Encounter (Signed)
Spoke with Edward Harvey states she was told that no appointment could be made with Dr Daleen Squibb due to Dr Daleen Squibb leaving the practice and patient would be new to Dr Daleen Squibb. I called cardiology and spoke with Edward Harvey; explained to him that the patient is not new(just seen in Jan 2013) and needed to follow up with Dr Daleen Squibb or whomever they are giving his patients to. Edward Harvey stated that the patient could go in and see Dr Daleen Squibb on July 2 at 11:30 am and then go from there as far what MD the patient would see after that. I spoke with Edward Harvey and she is aware of the above and the appointment. Nothing more needed at this time.

## 2012-09-22 DIAGNOSIS — F0390 Unspecified dementia without behavioral disturbance: Secondary | ICD-10-CM | POA: Diagnosis not present

## 2012-09-22 DIAGNOSIS — H543 Unqualified visual loss, both eyes: Secondary | ICD-10-CM | POA: Diagnosis not present

## 2012-09-22 DIAGNOSIS — I2510 Atherosclerotic heart disease of native coronary artery without angina pectoris: Secondary | ICD-10-CM | POA: Diagnosis not present

## 2012-09-22 DIAGNOSIS — Z5189 Encounter for other specified aftercare: Secondary | ICD-10-CM | POA: Diagnosis not present

## 2012-09-22 DIAGNOSIS — M1990 Unspecified osteoarthritis, unspecified site: Secondary | ICD-10-CM | POA: Diagnosis not present

## 2012-09-22 DIAGNOSIS — R262 Difficulty in walking, not elsewhere classified: Secondary | ICD-10-CM | POA: Diagnosis not present

## 2012-09-24 DIAGNOSIS — M1990 Unspecified osteoarthritis, unspecified site: Secondary | ICD-10-CM | POA: Diagnosis not present

## 2012-09-24 DIAGNOSIS — R262 Difficulty in walking, not elsewhere classified: Secondary | ICD-10-CM | POA: Diagnosis not present

## 2012-09-24 DIAGNOSIS — Z5189 Encounter for other specified aftercare: Secondary | ICD-10-CM | POA: Diagnosis not present

## 2012-09-24 DIAGNOSIS — H543 Unqualified visual loss, both eyes: Secondary | ICD-10-CM | POA: Diagnosis not present

## 2012-09-24 DIAGNOSIS — F0390 Unspecified dementia without behavioral disturbance: Secondary | ICD-10-CM | POA: Diagnosis not present

## 2012-09-24 DIAGNOSIS — I2510 Atherosclerotic heart disease of native coronary artery without angina pectoris: Secondary | ICD-10-CM | POA: Diagnosis not present

## 2012-09-25 DIAGNOSIS — M1990 Unspecified osteoarthritis, unspecified site: Secondary | ICD-10-CM | POA: Diagnosis not present

## 2012-09-25 DIAGNOSIS — Z5189 Encounter for other specified aftercare: Secondary | ICD-10-CM | POA: Diagnosis not present

## 2012-09-25 DIAGNOSIS — I2510 Atherosclerotic heart disease of native coronary artery without angina pectoris: Secondary | ICD-10-CM | POA: Diagnosis not present

## 2012-09-25 DIAGNOSIS — F0390 Unspecified dementia without behavioral disturbance: Secondary | ICD-10-CM | POA: Diagnosis not present

## 2012-09-25 DIAGNOSIS — R262 Difficulty in walking, not elsewhere classified: Secondary | ICD-10-CM | POA: Diagnosis not present

## 2012-09-25 DIAGNOSIS — H543 Unqualified visual loss, both eyes: Secondary | ICD-10-CM | POA: Diagnosis not present

## 2012-09-29 DIAGNOSIS — Z5189 Encounter for other specified aftercare: Secondary | ICD-10-CM | POA: Diagnosis not present

## 2012-09-29 DIAGNOSIS — F0390 Unspecified dementia without behavioral disturbance: Secondary | ICD-10-CM | POA: Diagnosis not present

## 2012-09-29 DIAGNOSIS — H543 Unqualified visual loss, both eyes: Secondary | ICD-10-CM | POA: Diagnosis not present

## 2012-09-29 DIAGNOSIS — M1990 Unspecified osteoarthritis, unspecified site: Secondary | ICD-10-CM | POA: Diagnosis not present

## 2012-09-29 DIAGNOSIS — R262 Difficulty in walking, not elsewhere classified: Secondary | ICD-10-CM | POA: Diagnosis not present

## 2012-09-29 DIAGNOSIS — I2510 Atherosclerotic heart disease of native coronary artery without angina pectoris: Secondary | ICD-10-CM | POA: Diagnosis not present

## 2012-09-30 ENCOUNTER — Telehealth: Payer: Self-pay | Admitting: Pulmonary Disease

## 2012-09-30 NOTE — Telephone Encounter (Signed)
I spoke with tiffany. She stated she went out to pt home and stated pt daughter went off on her. She stated the daughter told her she was not coming into there home just bc her father was in the hospital for diarrhea he does not need home health. She stated she said some other "ugly statements". She stated she made her boss aware of this and will not be going out to the home any longer. Will forward to SN as an [place].

## 2012-10-01 DIAGNOSIS — M1990 Unspecified osteoarthritis, unspecified site: Secondary | ICD-10-CM | POA: Diagnosis not present

## 2012-10-01 DIAGNOSIS — R262 Difficulty in walking, not elsewhere classified: Secondary | ICD-10-CM | POA: Diagnosis not present

## 2012-10-01 DIAGNOSIS — F0390 Unspecified dementia without behavioral disturbance: Secondary | ICD-10-CM | POA: Diagnosis not present

## 2012-10-01 DIAGNOSIS — Z5189 Encounter for other specified aftercare: Secondary | ICD-10-CM | POA: Diagnosis not present

## 2012-10-01 DIAGNOSIS — H543 Unqualified visual loss, both eyes: Secondary | ICD-10-CM | POA: Diagnosis not present

## 2012-10-01 DIAGNOSIS — I2510 Atherosclerotic heart disease of native coronary artery without angina pectoris: Secondary | ICD-10-CM | POA: Diagnosis not present

## 2012-10-02 DIAGNOSIS — M79609 Pain in unspecified limb: Secondary | ICD-10-CM | POA: Diagnosis not present

## 2012-10-02 DIAGNOSIS — B351 Tinea unguium: Secondary | ICD-10-CM | POA: Diagnosis not present

## 2012-10-06 DIAGNOSIS — M1990 Unspecified osteoarthritis, unspecified site: Secondary | ICD-10-CM | POA: Diagnosis not present

## 2012-10-06 DIAGNOSIS — F0390 Unspecified dementia without behavioral disturbance: Secondary | ICD-10-CM | POA: Diagnosis not present

## 2012-10-06 DIAGNOSIS — I2510 Atherosclerotic heart disease of native coronary artery without angina pectoris: Secondary | ICD-10-CM | POA: Diagnosis not present

## 2012-10-06 DIAGNOSIS — Z5189 Encounter for other specified aftercare: Secondary | ICD-10-CM | POA: Diagnosis not present

## 2012-10-06 DIAGNOSIS — R262 Difficulty in walking, not elsewhere classified: Secondary | ICD-10-CM | POA: Diagnosis not present

## 2012-10-06 DIAGNOSIS — H543 Unqualified visual loss, both eyes: Secondary | ICD-10-CM | POA: Diagnosis not present

## 2012-10-09 DIAGNOSIS — R262 Difficulty in walking, not elsewhere classified: Secondary | ICD-10-CM | POA: Diagnosis not present

## 2012-10-09 DIAGNOSIS — F0390 Unspecified dementia without behavioral disturbance: Secondary | ICD-10-CM | POA: Diagnosis not present

## 2012-10-09 DIAGNOSIS — M1990 Unspecified osteoarthritis, unspecified site: Secondary | ICD-10-CM | POA: Diagnosis not present

## 2012-10-09 DIAGNOSIS — H543 Unqualified visual loss, both eyes: Secondary | ICD-10-CM | POA: Diagnosis not present

## 2012-10-09 DIAGNOSIS — I2510 Atherosclerotic heart disease of native coronary artery without angina pectoris: Secondary | ICD-10-CM | POA: Diagnosis not present

## 2012-10-09 DIAGNOSIS — Z5189 Encounter for other specified aftercare: Secondary | ICD-10-CM | POA: Diagnosis not present

## 2012-10-13 DIAGNOSIS — Z5189 Encounter for other specified aftercare: Secondary | ICD-10-CM | POA: Diagnosis not present

## 2012-10-13 DIAGNOSIS — I2510 Atherosclerotic heart disease of native coronary artery without angina pectoris: Secondary | ICD-10-CM | POA: Diagnosis not present

## 2012-10-13 DIAGNOSIS — R262 Difficulty in walking, not elsewhere classified: Secondary | ICD-10-CM | POA: Diagnosis not present

## 2012-10-13 DIAGNOSIS — M1990 Unspecified osteoarthritis, unspecified site: Secondary | ICD-10-CM | POA: Diagnosis not present

## 2012-10-13 DIAGNOSIS — H543 Unqualified visual loss, both eyes: Secondary | ICD-10-CM | POA: Diagnosis not present

## 2012-10-13 DIAGNOSIS — F0390 Unspecified dementia without behavioral disturbance: Secondary | ICD-10-CM | POA: Diagnosis not present

## 2012-10-15 DIAGNOSIS — I2510 Atherosclerotic heart disease of native coronary artery without angina pectoris: Secondary | ICD-10-CM | POA: Diagnosis not present

## 2012-10-15 DIAGNOSIS — Z5189 Encounter for other specified aftercare: Secondary | ICD-10-CM | POA: Diagnosis not present

## 2012-10-15 DIAGNOSIS — F0390 Unspecified dementia without behavioral disturbance: Secondary | ICD-10-CM | POA: Diagnosis not present

## 2012-10-15 DIAGNOSIS — H543 Unqualified visual loss, both eyes: Secondary | ICD-10-CM | POA: Diagnosis not present

## 2012-10-15 DIAGNOSIS — R262 Difficulty in walking, not elsewhere classified: Secondary | ICD-10-CM | POA: Diagnosis not present

## 2012-10-15 DIAGNOSIS — M1990 Unspecified osteoarthritis, unspecified site: Secondary | ICD-10-CM | POA: Diagnosis not present

## 2012-10-19 DIAGNOSIS — H04129 Dry eye syndrome of unspecified lacrimal gland: Secondary | ICD-10-CM | POA: Diagnosis not present

## 2012-10-19 DIAGNOSIS — H409 Unspecified glaucoma: Secondary | ICD-10-CM | POA: Diagnosis not present

## 2012-10-19 DIAGNOSIS — H4011X0 Primary open-angle glaucoma, stage unspecified: Secondary | ICD-10-CM | POA: Diagnosis not present

## 2012-10-20 DIAGNOSIS — M1990 Unspecified osteoarthritis, unspecified site: Secondary | ICD-10-CM | POA: Diagnosis not present

## 2012-10-20 DIAGNOSIS — H543 Unqualified visual loss, both eyes: Secondary | ICD-10-CM | POA: Diagnosis not present

## 2012-10-20 DIAGNOSIS — F0390 Unspecified dementia without behavioral disturbance: Secondary | ICD-10-CM | POA: Diagnosis not present

## 2012-10-20 DIAGNOSIS — Z5189 Encounter for other specified aftercare: Secondary | ICD-10-CM | POA: Diagnosis not present

## 2012-10-20 DIAGNOSIS — R262 Difficulty in walking, not elsewhere classified: Secondary | ICD-10-CM | POA: Diagnosis not present

## 2012-10-20 DIAGNOSIS — N3000 Acute cystitis without hematuria: Secondary | ICD-10-CM | POA: Diagnosis not present

## 2012-10-20 DIAGNOSIS — I2510 Atherosclerotic heart disease of native coronary artery without angina pectoris: Secondary | ICD-10-CM | POA: Diagnosis not present

## 2012-10-20 DIAGNOSIS — R350 Frequency of micturition: Secondary | ICD-10-CM | POA: Diagnosis not present

## 2012-10-22 DIAGNOSIS — H543 Unqualified visual loss, both eyes: Secondary | ICD-10-CM | POA: Diagnosis not present

## 2012-10-22 DIAGNOSIS — F0390 Unspecified dementia without behavioral disturbance: Secondary | ICD-10-CM | POA: Diagnosis not present

## 2012-10-22 DIAGNOSIS — I2510 Atherosclerotic heart disease of native coronary artery without angina pectoris: Secondary | ICD-10-CM | POA: Diagnosis not present

## 2012-10-22 DIAGNOSIS — M1990 Unspecified osteoarthritis, unspecified site: Secondary | ICD-10-CM | POA: Diagnosis not present

## 2012-10-22 DIAGNOSIS — R262 Difficulty in walking, not elsewhere classified: Secondary | ICD-10-CM | POA: Diagnosis not present

## 2012-10-22 DIAGNOSIS — Z5189 Encounter for other specified aftercare: Secondary | ICD-10-CM | POA: Diagnosis not present

## 2012-11-02 DIAGNOSIS — H409 Unspecified glaucoma: Secondary | ICD-10-CM | POA: Diagnosis not present

## 2012-11-02 DIAGNOSIS — H4011X0 Primary open-angle glaucoma, stage unspecified: Secondary | ICD-10-CM | POA: Diagnosis not present

## 2012-11-25 ENCOUNTER — Other Ambulatory Visit: Payer: Self-pay | Admitting: Cardiology

## 2012-12-02 ENCOUNTER — Ambulatory Visit (INDEPENDENT_AMBULATORY_CARE_PROVIDER_SITE_OTHER): Payer: Medicare Other | Admitting: Cardiology

## 2012-12-02 ENCOUNTER — Ambulatory Visit (INDEPENDENT_AMBULATORY_CARE_PROVIDER_SITE_OTHER): Payer: Medicare Other | Admitting: *Deleted

## 2012-12-02 ENCOUNTER — Encounter: Payer: Self-pay | Admitting: Cardiology

## 2012-12-02 VITALS — BP 138/68 | HR 60 | Ht 73.0 in | Wt 216.0 lb

## 2012-12-02 DIAGNOSIS — I10 Essential (primary) hypertension: Secondary | ICD-10-CM

## 2012-12-02 DIAGNOSIS — R269 Unspecified abnormalities of gait and mobility: Secondary | ICD-10-CM

## 2012-12-02 DIAGNOSIS — Z950 Presence of cardiac pacemaker: Secondary | ICD-10-CM | POA: Diagnosis not present

## 2012-12-02 DIAGNOSIS — Z87828 Personal history of other (healed) physical injury and trauma: Secondary | ICD-10-CM

## 2012-12-02 DIAGNOSIS — Z8679 Personal history of other diseases of the circulatory system: Secondary | ICD-10-CM | POA: Diagnosis not present

## 2012-12-02 DIAGNOSIS — I4891 Unspecified atrial fibrillation: Secondary | ICD-10-CM

## 2012-12-02 DIAGNOSIS — I2510 Atherosclerotic heart disease of native coronary artery without angina pectoris: Secondary | ICD-10-CM | POA: Diagnosis not present

## 2012-12-02 DIAGNOSIS — I442 Atrioventricular block, complete: Secondary | ICD-10-CM | POA: Diagnosis not present

## 2012-12-02 LAB — PACEMAKER DEVICE OBSERVATION
AL THRESHOLD: 0.75 V
BATTERY VOLTAGE: 2.75 V
DEVICE MODEL PM: 1522496

## 2012-12-02 NOTE — Patient Instructions (Signed)
Follow-up with Dr. Graciela Husbands this October.  Your physician recommends that you continue on your current medications as directed. Please refer to the Current Medication list given to you today.

## 2012-12-02 NOTE — Progress Notes (Signed)
Device check in clinic, all functions normal, see PaceArt for full details.  ROV w/ Dr. Graciela Husbands in 6 mo.  CZ

## 2012-12-03 NOTE — Progress Notes (Signed)
HPI Edward Harvey  comes in today for the evaluation and management of his history of coronary artery disease, complete AV block, and cardiac pacer. He is doing well but is sad since his wife died. His daughters with him.  He denies any chest pain, palpitations, presyncope or syncope. He is compliant with his medications.  Past Medical History  Diagnosis Date  . Glaucoma   . Acute bronchitis   . Hypertension   . CAD (coronary artery disease)     nuclear stress test 2006 inferoseptal and apical infarct ejection fraction 48%  . Atrioventricular block, complete   . Cardiac pacemaker in situ   . Diverticulosis of colon with hemorrhage   . Hemorrhage of gastrointestinal tract, unspecified   . Unspecified intestinal obstruction   . Inguinal hernia   . Gallstone   . DJD (degenerative joint disease)   . Gout   . Hematoma     subdural  . Prostate cancer     Current Outpatient Prescriptions  Medication Sig Dispense Refill  . allopurinol (ZYLOPRIM) 300 MG tablet Take 300 mg by mouth daily.      Marland Kitchen aspirin 81 MG chewable tablet Chew 81 mg by mouth daily.       . brimonidine (ALPHAGAN P) 0.1 % SOLN Place 1 drop into both eyes 3 (three) times daily.       . carvedilol (COREG) 6.25 MG tablet Take 6.25 mg by mouth 2 (two) times daily with a meal.      . Cholecalciferol (VITAMIN D) 1000 UNITS capsule Take 1,000 Units by mouth daily.        . furosemide (LASIX) 20 MG tablet TAKE 1 TABLET BY MOUTH EVERY DAY  90 tablet  0  . hydrocortisone (ANUSOL-HC) 2.5 % rectal cream Place 1 application rectally daily as needed for hemorrhoids.      . mesalamine (LIALDA) 1.2 G EC tablet Take 1.2 g by mouth 2 (two) times daily.      . Multiple Vitamins-Minerals (CENTRUM SILVER PO) Take 1 tablet by mouth daily.        . quinapril (ACCUPRIL) 20 MG tablet Take 20 mg by mouth every evening.      . Tamsulosin HCl (FLOMAX) 0.4 MG CAPS Take 1 capsule (0.4 mg total) by mouth daily after supper.  90 capsule  1   No current  facility-administered medications for this visit.    No Known Allergies  No family history on file.  History   Social History  . Marital Status: Widowed    Spouse Name: N/A    Number of Children: 4  . Years of Education: N/A   Occupational History  . Retired    Social History Main Topics  . Smoking status: Never Smoker   . Smokeless tobacco: Never Used  . Alcohol Use: No     Comment: Occ wine   . Drug Use: No  . Sexually Active: Not on file   Other Topics Concern  . Not on file   Social History Narrative  . No narrative on file    ROS ALL NEGATIVE EXCEPT THOSE NOTED IN HPI  PE  General Appearance: well developed, well nourished in no acute distress, elderly HEENT: symmetrical face, PERRLA, good dentition  Neck: no JVD, thyromegaly, or adenopathy, trachea midline Chest: symmetric without deformity Cardiac: PMI non-displaced, RRR, normal S1, S2, no gallop or murmur Lung: clear to ausculation and percussion Vascular: all pulses full without bruits  Abdominal: nondistended, nontender, good bowel sounds, no HSM,  no bruits Extremities: no cyanosis, clubbing or edema, no sign of DVT, no varicosities  Skin: normal color, no rashes Neuro: alert and oriented x 3, non-focal Pysch: normal affect  EKG AV pacing BMET    Component Value Date/Time   NA 137 09/07/2012 0600   K 3.7 09/07/2012 0600   CL 107 09/07/2012 0600   CO2 24 09/07/2012 0600   GLUCOSE 93 09/07/2012 0600   BUN 23 09/07/2012 0600   CREATININE 1.33 09/07/2012 0600   CALCIUM 8.6 09/07/2012 0600   GFRNONAA 44* 09/07/2012 0600   GFRAA 51* 09/07/2012 0600    Lipid Panel     Component Value Date/Time   CHOL  Value: 74        ATP III CLASSIFICATION:  <200     mg/dL   Desirable  829-562  mg/dL   Borderline High  >=130    mg/dL   High        8/65/7846 0115   TRIG 123 07/23/2009 0115   HDL 16* 07/23/2009 0115   CHOLHDL 4.6 07/23/2009 0115   VLDL 25 07/23/2009 0115   LDLCALC  Value: 33        Total Cholesterol/HDL:CHD Risk  Coronary Heart Disease Risk Table                     Men   Women  1/2 Average Risk   3.4   3.3  Average Risk       5.0   4.4  2 X Average Risk   9.6   7.1  3 X Average Risk  23.4   11.0        Use the calculated Patient Ratio above and the CHD Risk Table to determine the patient's CHD Risk.        ATP III CLASSIFICATION (LDL):  <100     mg/dL   Optimal  962-952  mg/dL   Near or Above                    Optimal  130-159  mg/dL   Borderline  841-324  mg/dL   High  >401     mg/dL   Very High 0/27/2536 6440    CBC    Component Value Date/Time   WBC 7.4 09/07/2012 0600   RBC 3.13* 09/07/2012 0600   HGB 10.3* 09/07/2012 0600   HCT 29.7* 09/07/2012 0600   PLT 164 09/07/2012 0600   MCV 94.9 09/07/2012 0600   MCH 32.9 09/07/2012 0600   MCHC 34.7 09/07/2012 0600   RDW 14.8 09/07/2012 0600   LYMPHSABS 1.7 06/09/2012 1058   MONOABS 0.9 06/09/2012 1058   EOSABS 0.1 06/09/2012 1058   BASOSABS 0.0 06/09/2012 1058

## 2012-12-03 NOTE — Assessment & Plan Note (Signed)
Stable and paced. He is not a Coumadin candidate because of falls and history of subdural hematoma

## 2012-12-03 NOTE — Assessment & Plan Note (Signed)
Device interrogated today by pacer clinic and is working satisfactorily. We'll have him follow with Dr. Graciela Husbands in 6 months.

## 2012-12-08 ENCOUNTER — Other Ambulatory Visit (INDEPENDENT_AMBULATORY_CARE_PROVIDER_SITE_OTHER): Payer: Medicare Other

## 2012-12-08 ENCOUNTER — Encounter: Payer: Self-pay | Admitting: Pulmonary Disease

## 2012-12-08 ENCOUNTER — Ambulatory Visit (INDEPENDENT_AMBULATORY_CARE_PROVIDER_SITE_OTHER): Payer: Medicare Other | Admitting: Pulmonary Disease

## 2012-12-08 VITALS — BP 116/72 | HR 60 | Temp 97.1°F | Ht 73.0 in | Wt 218.8 lb

## 2012-12-08 DIAGNOSIS — I2510 Atherosclerotic heart disease of native coronary artery without angina pectoris: Secondary | ICD-10-CM

## 2012-12-08 DIAGNOSIS — R269 Unspecified abnormalities of gait and mobility: Secondary | ICD-10-CM

## 2012-12-08 DIAGNOSIS — Z8546 Personal history of malignant neoplasm of prostate: Secondary | ICD-10-CM

## 2012-12-08 DIAGNOSIS — K5731 Diverticulosis of large intestine without perforation or abscess with bleeding: Secondary | ICD-10-CM

## 2012-12-08 DIAGNOSIS — I442 Atrioventricular block, complete: Secondary | ICD-10-CM | POA: Diagnosis not present

## 2012-12-08 DIAGNOSIS — F0390 Unspecified dementia without behavioral disturbance: Secondary | ICD-10-CM

## 2012-12-08 DIAGNOSIS — K5010 Crohn's disease of large intestine without complications: Secondary | ICD-10-CM

## 2012-12-08 DIAGNOSIS — M109 Gout, unspecified: Secondary | ICD-10-CM

## 2012-12-08 DIAGNOSIS — I10 Essential (primary) hypertension: Secondary | ICD-10-CM | POA: Diagnosis not present

## 2012-12-08 DIAGNOSIS — I4891 Unspecified atrial fibrillation: Secondary | ICD-10-CM | POA: Diagnosis not present

## 2012-12-08 DIAGNOSIS — M1990 Unspecified osteoarthritis, unspecified site: Secondary | ICD-10-CM

## 2012-12-08 DIAGNOSIS — N289 Disorder of kidney and ureter, unspecified: Secondary | ICD-10-CM

## 2012-12-08 DIAGNOSIS — Z950 Presence of cardiac pacemaker: Secondary | ICD-10-CM

## 2012-12-08 LAB — BASIC METABOLIC PANEL
CO2: 29 mEq/L (ref 19–32)
Glucose, Bld: 91 mg/dL (ref 70–99)
Potassium: 4.5 mEq/L (ref 3.5–5.1)
Sodium: 140 mEq/L (ref 135–145)

## 2012-12-08 LAB — HEPATIC FUNCTION PANEL
AST: 43 U/L — ABNORMAL HIGH (ref 0–37)
Albumin: 3.3 g/dL — ABNORMAL LOW (ref 3.5–5.2)
Alkaline Phosphatase: 109 U/L (ref 39–117)
Total Protein: 7.4 g/dL (ref 6.0–8.3)

## 2012-12-08 LAB — CBC WITH DIFFERENTIAL/PLATELET
Basophils Absolute: 0 10*3/uL (ref 0.0–0.1)
Lymphocytes Relative: 14 % (ref 12.0–46.0)
Lymphs Abs: 1.3 10*3/uL (ref 0.7–4.0)
Monocytes Relative: 9.1 % (ref 3.0–12.0)
Platelets: 183 10*3/uL (ref 150.0–400.0)
RDW: 16.5 % — ABNORMAL HIGH (ref 11.5–14.6)

## 2012-12-08 NOTE — Patient Instructions (Addendum)
Today we updated your med list in our EPIC system...    Continue your current medications the same...  Remember to eliminate all the salt/ sodium from your diet...    Try the ALIGN probiotic daily to help w/ your bowels...  Today we did your follow up blood work...    We will contact you w/ the results when available...   Call for any questions...  Let's plan a follow up visit in 3-29mo, sooner if needed for problems.Marland KitchenMarland Kitchen

## 2012-12-08 NOTE — Progress Notes (Signed)
Subjective:    Patient ID: Edward Harvey, male    DOB: 1915/12/12, 79 y.o.   MRN: [id number]  HPI 79 y/o BM here for a follow up visit... he has multiple medical problems including HBP, CAD, & complete heart block w/ pacer followed by DrWall & DrKlein;  hx of GI bleed from divertics w/ subtotal colectomy in 2003 & followed by DrPatterson;  hx SBO, inguinal hernias, gallstones;  hx prostate cancer followed by DrOttelin;  DJD & Gout;  hx subdural hematoma prev requiring burr hole, & mild senile dementia...  ~  June 03, 2011:  15mo ROV & he says "getting older & slower" but notes that he & his wife will celebrate their 72nd Christian Mate this coming yr...     AB> no recent URI or breathing prob reported...    HBP> on ASA81, Coreg6.25Bid, Quinapril20Bid, Lasix20; wt is down 5# on diet to 223#; BP= 116/78...    CAD/ AVblock/ Pacer> on meds above; he denies CP, palpit, ch in dyspnea or edema...    Divertics w/ hx hemorrhage/ Hx SBO> he denies abd pain, N/V, D/C, ch in bowels or blood seen...    Hx Prostate Cancer> followed by DrOttelin but we don't have recent notes...     DJD/ Gout> on Allopurinol300, OTC analgesics prn & MVI/ Vit D supplement;     Hx subdural hematoma> required burr holes back then; no known sequellae & he hasn't been falling...  ~  Oct 03, 2011:  15mo ROV & [name] is here w/ his son today- his wife passed away 2022/08/31;  He appears to be doing ok- "just older & slower" as he's said before; BP controlled, breathing is ok, denies CP etc;  See prob list below>> CXR 5/13 showed stable heart size & pacer, sl hyperinflation, clear lungs, DJD spine, NAD... LABS 5/13:  Chems- ok w/ BUN=25 Creat=1.4 Uric=5.8 on Allopurinol...  ~  February 05, 2012:  15mo ROV & [name] denies any problems other than "getting older"; he notes the vision in his left eye is getting worse, on 3 diff eye drops, & he stopped driving 4/09 (Dr Nile Riggs sent him to [facility] for 2nd opinion);  BP is well controlled on Coreg,  Quinapril, & Lasix;  He denies CP, palpit, ch in SOB or edema;  GI is ok w/o recent problems, but his urine in slower & we discussed trial FLOMAX 0.4mg  Qhs;  Overall he is quite remarkable for 79 y/o... We reviewed prob list, meds, xrays and labs> see below for updates >>  ~  March 19, 2012:  Add-on at pt request> he saw TP 02/26/12 c/o hoarseness & bad taste, phlegm in throat, right lower rib cage discomfort; exam was neg & he was rec to take Claritin5mg , nasal saline, heating pad to ribs , and Advil prn;  He reports that his symptoms have all abated, feeling back to baseline, "just older & slower" he reiterates...  We reviewed prob list, meds, xrays and labs> see below for updates >>   ~  June 09, 2102:  12mo ROV & [name] indicates he had some diarrhea, saw DrPatterson & now better w/ addition of Lialda 1.2gmBid for Crohn's (but he thinks it was salads); We reviewed the following medical problems during today's office visit>>     AB> no recent URI or breathing prob reported...    HBP> on ASA81, Coreg6.25Bid, Quinapril20, Lasix20; wt is up 8# on diet to 225#; BP= 118/70...    CAD/ AVblock/ Pacer> on meds above;  he denies CP, palpit, ch in dyspnea or edema...    Divertics w/ hx hemorrhage/ Hx SBO> he denies abd pain, N/V/C; recent diarrhea improved w/ Lialda.    Hx Prostate Cancer> followed by DrOttelin but we don't have recent notes...     DJD/ Gout> on Allopurinol300, OTC analgesics prn & MVI/ Vit D supplement;     Hx subdural hematoma> required burr holes back then; no known sequellae & he hasn't been falling... We reviewed prob list, meds, xrays and labs> see below for updates >>  LABS 1/14:  Chems- wnl w/ Creat=1.4 Alb=3.0;  CBC- ok w/ Hg=11.9;  TSH=1.76;  Sed=38... rec attn to diet & continue Lialda.  ~  September 08, 2012:  7mo ROV & post hosp check> He was [facility] 4/6-7/14 after he got too hot in church he says; DCSummary indicates syncope assoc w/ BUN~25, Cr~1.4, Hg~10, CXR- NAD; given IVF &  improved, no postural BP changes; he knows to avoid sodium & is taking Lasix20/d... Feels he is back to baseline- wants to restart his PT w/ [facility]- ok... We reviewed the following medical problems during today's office visit >>     AB> no recent URI or breathing prob reported...    HBP> on ASA81, Coreg6.25Bid, Quinapril20, Lasix20; wt is down 1# on diet to 224#; BP= 140/78 & he feels he is stable...    CAD/ AVblock/ Pacer> on meds above; he denies CP, palpit, ch in dyspnea or edema...    Divertics w/ hx hemorrhage/ Hx SBO> he denies abd pain, N/V/C; recent diarrhea improved w/ Lialda1.2Bid    Hx Prostate Cancer> on Flomax0.4; followed by DrOttelin but we don't have recent notes...     DJD/ Gout> on Allopurinol300, OTC analgesics prn & MVI/ Vit D supplement;     Hx subdural hematoma> required burr holes back then; no known sequellae & he hasn't been falling... We reviewed prob list, meds, xrays and labs> see below for updates >>   ~  December 08, 2012:  7mo ROV & Loyal has lost 5# down to 219# & he has less edema despite cutting his Lasix to 20mg /d last OV; he notes incr urinary freq during the day (on Flomax0.4) but denies nocturia! We discussed low sodium & incr exercise...  He saw DrWall 7/14 for f/u CAD, heart block, pacer- doing satis & pacer check is ok; he will f/u w/ DrKlein in 78mo; BP= 116/72 on his Coreg6.25Bid, Accupril20, & Lasix20;  His son says that he was c/o some diarrhea but pt denies to me & remains on Lialda 1.2gmBid...     We reviewed prob list, meds, xrays and labs> see below for updates >>  LABS 7/14:  Chems- ok w/ Cr=1.3;  CBC- okw/ Hg=11.7         Problem List:    GLAUCOMA (ICD-365.9) - prev followed by Deland Pretty w/ bilat cataract surg & glaucoma on eye 3 diff drops as noted...  HEARING LOSS - son says he has 3 sets of hearing aides but won't wear any of them...  Hx of ASTHMATIC BRONCHITIS, ACUTE (ICD-466.0) - he has never smoked... hx recurrent bronchitic infections over  the yrs w/ reactive airway component... no recent prob, and no regular meds required.  ~  CXR 5/13 showed stable hrt size & pacer, clear lungs, degen changes in Tspine, NAD.Marland Kitchen. ~  CXR 4/14 showed heart at upper lim normal, totuous Ao, pacer, clear lungs/ stable scarring at left base/ NAD...  HYPERTENSION (ICD-401.9) - on ASA 81mg /d, COREG  6.25Bid, QUINAPRIL 20mg Bid, LASIX 20mg /d... He takes his own meds & states no problems. ~  4/12:  BP= 110/74 & feeling well... he denies HA, visual changes, CP, palipit, dizziness, syncope, change in dyspnea, etc... ~  8/12:  BP= 110/68 & continues stable... ~  12/12:  BP= 116/78 & he remains asymptomatic... ~  5/13:  BP= 126/80 & he continues to deny CP, palpit, SOB, edema, etc... ~  9/13:  BP= 126/78 & he denies CP, palpit, ch in SOB or edema... ~  10/13:  BP= 102/66 & he remains largely asymptomatic... ~  1/14:  BP= 118/70 & he continues to deny CP, palpit, ch in SOB/ edema... ~  4/14: on ASA81, Coreg6.25Bid, Quinapril20, Lasix20; wt is down 1# on diet to 224#; BP= 140/78 & he feels he is stable. ~  7/14: on ASA81, Coreg6.25Bid, Quinapril20, Lasix20; weight is down 5# to 219# & BP= 116/72  CORONARY ARTERY DISEASE (ICD-414.00) - followed by DrWall for Cardiology on above meds... pt has not had a prev cath... ~  2DEcho 4/05 showed mild conc LVH and norm LVF- improved from 2002 when EF= 45%... ~  NuclearStressTest 10/06 showed prior inferoseptal & apical infarct w/ apical AK, w/o ischemia, EF= 48%... no change from prev.  Hx of AV BLOCK, COMPLETE (ICD-426.0), & CARDIAC PACEMAKER IN SITU (ICD-V45.01) - he had a pacemaker placed for complete heart block initially in 1994, & this was exchanged for a new dual chamber pacer: Valene Bors DDDR model [phone] by [facility] 6/06... ~  seen by DrKlein 1/10 w/ pacer check OK, no further episodes of AFib on his monitor... not a Coumadin candidate due to age, unsteady, hx of GI bleeds, and prev subdural hematoma. ~  followed  regularly by DrKlein's pacer clinic (last note 10/12 reviewed)> stable, doing well, no changes made.  DIVERTICULOSIS OF COLON WITH HEMORRHAGE (ICD-562.12)  GASTROINTESTINAL HEMORRHAGE (ICD-578.9) - he had a subtotal colectomy w/ ileum to distal sigmoid anastomosis in Jul03 by DrNewman... ~  EGD 8/03 by Dorris Singh was normal... ~  colonoscopy 1/08 by DrPatterson showed inflammed mucosa in sm bowel prox to the ileo-colonic anastomosis, few divertics in remaining distal colon, no polyps etc...  ~  f/u colonoscopy 5/09 showed prev colectomy- anastomosis granular & bleeding ?Crohn's... also had divertics & hems... Rx'd w/ Lialda.  CROHN'S COLITIS >>  Hx of SMALL BOWEL OBSTRUCTION (ICD-560.9) > see [id number] Hospitalization by [facility]... ~  Abd Sonar 4/11 showed mult gallstones in GB, echogenic renal parenchyma c/w medical renal dis... ~  CT Abd 4/11 showed mild atx at bases, sl GB Klang thickening & ductal dilatation (known gallstones), mult cysts in spleen & kidneys, ectatic ao, bilat fat filled inguinal hernias, degen changes in spine... ~  12/13: he had f/u DrPatterson & note reviewed> hx segmental colitis assoc w/ severe diverticulosis & prev left colon resection; restarted LIALDA 1.2gm Bid for diarrhea & improved...  INGUINAL HERNIA (ICD-550.90) - he has bilat fat containing inguinal hernias seen on CT Abd in 2008...   GALLSTONES (ICD-574.20) - Sonar 4/11 showed mult gallstones filling the gallbladder, and CT Abd 4/11 showed mild extrahep biliary ductal dilatation... he was evaluated by DrPatterson/ GI, DrNewman/ Surg, DrWall/ Cards> all agreed that risk was hi & to hold off on surg unless absolutely necessary... ~  4/12 & 8/12:  He remains asymptomatic w/o abd pain, N/V, etc...  RENAL INSUFFICIENCY >> Creat in the 1.4 - 1.5 range... CARCINOMA, PROSTATE, HX OF (ICD-V10.46) - diagnosed in 1993 & treated w/ XRT... PSA  initially ~29 and dropped to ~1 after XRT & it has remained there ever since... followed  by DrOttelin- also has BPH, min obstructive symptoms, bilat hydroceles & some benign renal cysts... ~  labs 3/07 by Urology showed PSA= 1.19 ~  labs here 1/10 showed PSA= 1.37 ~  5/11: he reports f/u DrOttelin w/ incontinence symptoms Rx'd [place] XR 60mg /d & improved, so he stopped this on his own & states that he is doing satis now... ~  He continues to f/u w/ DrOttelin regularly- we do not have recent notes from Urology... ~  9/13:  He notes slower urinary stream & we decided to try [facility] 0.4mg Qhs... ~  Labs 1/14 showed BUN= 23, creat= 1.4 ~  Labs 7/14 showed BUN= 21, Creat= 1.3  DEGENERATIVE JOINT DISEASE (ICD-715.90) >> Hx of GOUT (ICD-274.9) >> he has hx of DJD and Gout treated w/ TRAMADOL Prn & ALLOPURINOL 300mg /d; also takes MVI & VIT D 1000 u daily... ~  Labs 5/13 showed Uric= 5.8  HEMATOMA, SUBDURAL (ICD-432.1) - he had a burr hole placed for subdural hematoma in the past...    Past Surgical History  Procedure Laterality Date  . Pacemaker placement    . Cataract extraction, bilateral    . Tonsillectomy  age 53  . Subtotal colectomy  12/03    Dr. Ezzard Standing  . Lipoma excision  2001    left leg    Outpatient Encounter Prescriptions as of 12/08/2012  Medication Sig Dispense Refill  . allopurinol (ZYLOPRIM) 300 MG tablet Take 300 mg by mouth daily.      Marland Kitchen aspirin 81 MG chewable tablet Chew 81 mg by mouth daily.       . brimonidine (ALPHAGAN P) 0.1 % SOLN Place 1 drop into both eyes 3 (three) times daily.       . carvedilol (COREG) 6.25 MG tablet Take 6.25 mg by mouth 2 (two) times daily with a meal.      . Cholecalciferol (VITAMIN D) 1000 UNITS capsule Take 1,000 Units by mouth daily.        . furosemide (LASIX) 20 MG tablet TAKE 1 TABLET BY MOUTH EVERY DAY  90 tablet  0  . hydrocortisone (ANUSOL-HC) 2.5 % rectal cream Place 1 application rectally daily as needed for hemorrhoids.      . mesalamine (LIALDA) 1.2 G EC tablet Take 1.2 g by mouth 2 (two) times daily.      . Multiple  Vitamins-Minerals (CENTRUM SILVER PO) Take 1 tablet by mouth daily.        . quinapril (ACCUPRIL) 20 MG tablet Take 20 mg by mouth every evening.      . Tamsulosin HCl (FLOMAX) 0.4 MG CAPS Take 1 capsule (0.4 mg total) by mouth daily after supper.  90 capsule  1   No facility-administered encounter medications on file as of 12/08/2012.    No Known Allergies   Current Medications, Allergies, Past Medical History, Past Surgical History, Family History, and Social History were reviewed in [employer] record.    Review of Systems        See HPI - all other systems neg except as noted... The patient complains of dyspnea on exertion, peripheral edema, muscle weakness, and difficulty walking.  The patient denies anorexia, fever, weight loss, weight gain, hoarseness, chest pain, syncope, prolonged cough, headaches, hemoptysis, abdominal pain, melena, hematochezia, severe indigestion/heartburn, hematuria, incontinence, suspicious skin lesions, transient blindness, depression, unusual weight change, abnormal bleeding, enlarged lymph nodes, and angioedema.  Objective:   Physical Exam     WD, overweight, 79 y/o BM in NAD...  GENERAL:  Alert, pleasant & cooperative;  VS- reviewed... HEENT:  [place]/AT, EOM- sl strabismus, PERRLA, EACs-clear (HOH), TMs-wnl, NOSE-clear discharge , THROAT-clear & wnl. NECK:  Supple w/ decrROM; no JVD; normal carotid impulses w/o bruits; no thyromegaly or nodules palpated; no lymphadenopathy. CHEST:  Clear to P & A; without wheezes/ rales/ or rhonchi heard;  Pacer palp in right chest... HEART:  Regular Rhythm; without murmurs/ rubs/ or gallops detected... ABDOMEN:  Scar of prev surg, soft & nontender; normal bowel sounds; no organomegaly or masses palpated...  EXT:  Mod-severe arthritic changes; no varicose veins/ +venous insuffic/ tr edema. NEURO:  CN's intact; motor testing normal; sensory testing inconsistant; gait is abnormal & balance only  fair... DERM:  No lesions noted; no rash etc...  RADIOLOGY DATA:  Reviewed in the EPIC EMR & discussed w/ the patient...  LABORATORY DATA:  Reviewed in the EPIC EMR & discussed w/ the patient...   Assessment & Plan:    HBP>  BP stable, continue same meds...  CAD, PACER>  He denies angina, palpit, syncope, etc;  Continue cardiology f/u w/ DrTomWall & DrKlein.  Divertics, Crohn's>  He has hx GI hemorrhage in past w/ part colectomy; Hx Crohn's colitis w/ diarrhea- improved on Lialda...  Gallstones>  He remains asymptomatic...  Prostate Ca>  Followed by DrOttelin & stable...  DJD>  This is his main issue w/ mobility, self care, remaining independent...  Hx Subdural Hematoma>  Stable w/o acute problems...   Patient's Medications  New Prescriptions   No medications on file  Previous Medications   ALLOPURINOL (ZYLOPRIM) 300 MG TABLET    Take 300 mg by mouth daily.   ASPIRIN 81 MG CHEWABLE TABLET    Chew 81 mg by mouth daily.    BRIMONIDINE (ALPHAGAN P) 0.1 % SOLN    Place 1 drop into both eyes 3 (three) times daily.    CARVEDILOL (COREG) 6.25 MG TABLET    Take 6.25 mg by mouth 2 (two) times daily with a meal.   CHOLECALCIFEROL (VITAMIN D) 1000 UNITS CAPSULE    Take 1,000 Units by mouth daily.     FUROSEMIDE (LASIX) 20 MG TABLET    TAKE 1 TABLET BY MOUTH EVERY DAY   HYDROCORTISONE (ANUSOL-HC) 2.5 % RECTAL CREAM    Place 1 application rectally daily as needed for hemorrhoids.   MESALAMINE (LIALDA) 1.2 G EC TABLET    Take 1.2 g by mouth 2 (two) times daily.   MULTIPLE VITAMINS-MINERALS (CENTRUM SILVER PO)    Take 1 tablet by mouth daily.     QUINAPRIL (ACCUPRIL) 20 MG TABLET    Take 20 mg by mouth every evening.   TAMSULOSIN HCL (FLOMAX) 0.4 MG CAPS    Take 1 capsule (0.4 mg total) by mouth daily after supper.  Modified Medications   No medications on file  Discontinued Medications   No medications on file

## 2012-12-10 ENCOUNTER — Telehealth: Payer: Self-pay | Admitting: Pulmonary Disease

## 2012-12-10 NOTE — Telephone Encounter (Signed)
Lab results given per Dr Kriste Basque.

## 2012-12-16 ENCOUNTER — Encounter: Payer: Self-pay | Admitting: Internal Medicine

## 2012-12-23 DIAGNOSIS — B351 Tinea unguium: Secondary | ICD-10-CM | POA: Diagnosis not present

## 2012-12-23 DIAGNOSIS — M79609 Pain in unspecified limb: Secondary | ICD-10-CM | POA: Diagnosis not present

## 2012-12-29 ENCOUNTER — Other Ambulatory Visit: Payer: Self-pay | Admitting: Pulmonary Disease

## 2012-12-31 DIAGNOSIS — R197 Diarrhea, unspecified: Secondary | ICD-10-CM | POA: Diagnosis not present

## 2013-01-04 ENCOUNTER — Encounter: Payer: Self-pay | Admitting: *Deleted

## 2013-01-06 ENCOUNTER — Other Ambulatory Visit: Payer: Self-pay

## 2013-01-08 ENCOUNTER — Encounter: Payer: Self-pay | Admitting: Gastroenterology

## 2013-01-08 ENCOUNTER — Ambulatory Visit (INDEPENDENT_AMBULATORY_CARE_PROVIDER_SITE_OTHER): Payer: Medicare Other | Admitting: Gastroenterology

## 2013-01-08 VITALS — BP 94/60 | HR 64 | Ht 73.0 in | Wt 209.4 lb

## 2013-01-08 DIAGNOSIS — K5010 Crohn's disease of large intestine without complications: Secondary | ICD-10-CM

## 2013-01-08 DIAGNOSIS — R159 Full incontinence of feces: Secondary | ICD-10-CM | POA: Diagnosis not present

## 2013-01-08 DIAGNOSIS — R197 Diarrhea, unspecified: Secondary | ICD-10-CM

## 2013-01-08 DIAGNOSIS — Z9889 Other specified postprocedural states: Secondary | ICD-10-CM

## 2013-01-08 DIAGNOSIS — Z9049 Acquired absence of other specified parts of digestive tract: Secondary | ICD-10-CM

## 2013-01-08 DIAGNOSIS — K8020 Calculus of gallbladder without cholecystitis without obstruction: Secondary | ICD-10-CM

## 2013-01-08 NOTE — Progress Notes (Addendum)
This is a 79 year old [place] American male with multiple medical problems and mild dementia.  He comes in with his son for followup of occasional fecal incontinence occasional frequent stooling.  He denies current abdominal pain, rectal bleeding, upper GI or hepatobiliary complaints.  His had previous partial colectomy years ago for recurrent diverticulitis.  He's felt to have underlying possible inflammatory bowel disease with flexible sigmoidoscopy several years ago showing anastomotic inflammation, and biopsies were felt consistent with Crohn's disease.  He is been on Lialda 2.4 g a day for several years.  He is having no anorexia, weight loss, melena or hematochezia.  He is followed closely by primary care, urology, and cardiology.  Current Medications, Allergies, Past Medical History, Past Surgical History, Family History and Social History were reviewed in [employer] record.  ROS: All systems were reviewed and are negative unless otherwise stated in the HPI.          Physical Exam: Awake and alert in no acute distress.  Has mild dementia but is oriented x3.  Blood pressure 94/60, pulse 64 and regular and weight 209.  His abdomen shows no organomegaly, masses, tenderness, or distention.  Bowel sounds are normal.  Inspection of rectum shows some perianal soiling.  There is a posterior skin tag but no evidence of fissures or fistulae.  Rectal exam shows no masses or tenderness with soft pasty stool is normal color, and is guaiac negative.  He has very poor external sphincter tone and squeeze pressure.    Assessment and Plan: Mild fecal incontinence day to poor external sphincter control, multiple medications, advanced age, and probable occasional diarrhea related to previous colectomy and possible stable IBD.  I certainly see no indication for colonoscopy at this time or other bowel radiographic studies.  He does have asymptomatic gallstones, and denies upper GI or  hepatobiliary complaints.  I placed him on Benefiber 1 tablespoon with food twice a day, liberal by mouth fluids, and we will give him a two-week trial of probiotic therapy with Align daily.  Patient has had excellent clinical response to when necessary Lomotil which have asked him to continue as needed.  Please copy her primary care physician, referring physician, and pertinent subspecialists.

## 2013-01-08 NOTE — Patient Instructions (Addendum)
Please purchase Benefiber over the counter and take one tablespoon by mouth twice daily  Please purchase Align over the counter, coupons given today. This puts good bacteria back into your colon. You should take 1 capsule by mouth once daily.   Please follow up with Dr. Jarold Motto in one year

## 2013-01-20 ENCOUNTER — Other Ambulatory Visit: Payer: Self-pay | Admitting: Pulmonary Disease

## 2013-01-26 ENCOUNTER — Telehealth: Payer: Self-pay | Admitting: Pulmonary Disease

## 2013-01-26 DIAGNOSIS — R269 Unspecified abnormalities of gait and mobility: Secondary | ICD-10-CM

## 2013-01-26 NOTE — Telephone Encounter (Signed)
Per SN---  Ok to order PT/OT consult at home to help with gait.

## 2013-01-26 NOTE — Telephone Encounter (Signed)
Spoke with the pt's daughter Edward Harvey She states pt fell approx a wk ago- no injuries She is asking if SN wants to order PT in the home for pt to help prevent future falls  Please advise, thanks!

## 2013-01-26 NOTE — Telephone Encounter (Signed)
Order was sent to [facility] to [facility]

## 2013-01-27 NOTE — Telephone Encounter (Signed)
LMTCB x 1 for [facility]

## 2013-01-27 NOTE — Telephone Encounter (Signed)
Spoke with Lynden Ang and advised that order for PT/OT was sent to [facility].

## 2013-01-29 ENCOUNTER — Ambulatory Visit (INDEPENDENT_AMBULATORY_CARE_PROVIDER_SITE_OTHER): Payer: Medicare Other | Admitting: Gastroenterology

## 2013-01-29 ENCOUNTER — Encounter: Payer: Self-pay | Admitting: Gastroenterology

## 2013-01-29 VITALS — BP 110/60 | HR 64 | Ht 73.0 in | Wt 212.2 lb

## 2013-01-29 DIAGNOSIS — R197 Diarrhea, unspecified: Secondary | ICD-10-CM | POA: Diagnosis not present

## 2013-01-29 DIAGNOSIS — K5010 Crohn's disease of large intestine without complications: Secondary | ICD-10-CM

## 2013-01-29 DIAGNOSIS — K5730 Diverticulosis of large intestine without perforation or abscess without bleeding: Secondary | ICD-10-CM

## 2013-01-29 NOTE — Patient Instructions (Signed)
Please purchase Benefiber over the counter and take one tablespoon by mouth twice daily  Your physician has requested that you go to the basement for lab work before leaving today

## 2013-01-29 NOTE — Progress Notes (Signed)
History of Present Illness: This is a demented 79 year old African American male with segmental colitis and occasional loose stools.  He is on daily Lialda 2.4 g, and local anal care.  He did not add fiber supplements to his diet as previously requested.  All the conversations between the patient's son Gwynneth Munson who is the primary caregiver.  There apparently is no history of rectal bleeding, incontinency, but he does have rather severe urinary urgency and is being treated by Dr. Vernie Ammons and urology.  Patient apparently on reviewing his records and speaking with his son has not been on antibiotics.  Last stool exam for C. difficile was over one year ago and was negative    Current Medications, Allergies, Past Medical History, Past Surgical History, Family History and Social History were reviewed in [employer] record.           Assessment and plan: Segmental colitis and diverticulosis doing fairly well.  I have again advised the patient and his son that he needs Benefiber 1 tablespoon twice a day sprinkled in his food.  He is to continue Lialda current doses and to use either when necessary Imodium or Lomotil.  Stool exam for C. difficile toxin again requested.  Copy Dr. Ihor Gully and Dr. [name]

## 2013-02-02 ENCOUNTER — Other Ambulatory Visit: Payer: Medicare Other

## 2013-02-02 DIAGNOSIS — R197 Diarrhea, unspecified: Secondary | ICD-10-CM

## 2013-02-03 LAB — CLOSTRIDIUM DIFFICILE BY PCR: Toxigenic C. Difficile by PCR: NOT DETECTED

## 2013-02-05 ENCOUNTER — Telehealth: Payer: Self-pay | Admitting: Pulmonary Disease

## 2013-02-05 NOTE — Telephone Encounter (Signed)
Message    Pt is scheduled for a start of care visit on Monday. I am having the scheduler call him today

## 2013-02-05 NOTE — Telephone Encounter (Signed)
I spoke with pt daughter and is aware. Nothing further needed

## 2013-02-05 NOTE — Telephone Encounter (Signed)
Order was giving to [facility] 01/26/13. I have sent her a staff message checking on this.  lmomtcb x1 for cathy to make her aware.

## 2013-02-08 DIAGNOSIS — Z8546 Personal history of malignant neoplasm of prostate: Secondary | ICD-10-CM | POA: Diagnosis not present

## 2013-02-08 DIAGNOSIS — I10 Essential (primary) hypertension: Secondary | ICD-10-CM | POA: Diagnosis not present

## 2013-02-08 DIAGNOSIS — H9190 Unspecified hearing loss, unspecified ear: Secondary | ICD-10-CM | POA: Diagnosis not present

## 2013-02-08 DIAGNOSIS — I2510 Atherosclerotic heart disease of native coronary artery without angina pectoris: Secondary | ICD-10-CM | POA: Diagnosis not present

## 2013-02-08 DIAGNOSIS — R269 Unspecified abnormalities of gait and mobility: Secondary | ICD-10-CM | POA: Diagnosis not present

## 2013-02-08 DIAGNOSIS — IMO0001 Reserved for inherently not codable concepts without codable children: Secondary | ICD-10-CM | POA: Diagnosis not present

## 2013-02-08 DIAGNOSIS — H409 Unspecified glaucoma: Secondary | ICD-10-CM | POA: Diagnosis not present

## 2013-02-08 DIAGNOSIS — M1990 Unspecified osteoarthritis, unspecified site: Secondary | ICD-10-CM | POA: Diagnosis not present

## 2013-02-08 DIAGNOSIS — Z45018 Encounter for adjustment and management of other part of cardiac pacemaker: Secondary | ICD-10-CM | POA: Diagnosis not present

## 2013-02-08 DIAGNOSIS — M109 Gout, unspecified: Secondary | ICD-10-CM | POA: Diagnosis not present

## 2013-02-08 DIAGNOSIS — I4891 Unspecified atrial fibrillation: Secondary | ICD-10-CM | POA: Diagnosis not present

## 2013-02-10 DIAGNOSIS — I2510 Atherosclerotic heart disease of native coronary artery without angina pectoris: Secondary | ICD-10-CM | POA: Diagnosis not present

## 2013-02-10 DIAGNOSIS — R269 Unspecified abnormalities of gait and mobility: Secondary | ICD-10-CM | POA: Diagnosis not present

## 2013-02-10 DIAGNOSIS — IMO0001 Reserved for inherently not codable concepts without codable children: Secondary | ICD-10-CM | POA: Diagnosis not present

## 2013-02-10 DIAGNOSIS — I4891 Unspecified atrial fibrillation: Secondary | ICD-10-CM | POA: Diagnosis not present

## 2013-02-10 DIAGNOSIS — I10 Essential (primary) hypertension: Secondary | ICD-10-CM | POA: Diagnosis not present

## 2013-02-10 DIAGNOSIS — H409 Unspecified glaucoma: Secondary | ICD-10-CM | POA: Diagnosis not present

## 2013-02-16 DIAGNOSIS — I10 Essential (primary) hypertension: Secondary | ICD-10-CM | POA: Diagnosis not present

## 2013-02-16 DIAGNOSIS — I4891 Unspecified atrial fibrillation: Secondary | ICD-10-CM | POA: Diagnosis not present

## 2013-02-16 DIAGNOSIS — R269 Unspecified abnormalities of gait and mobility: Secondary | ICD-10-CM | POA: Diagnosis not present

## 2013-02-16 DIAGNOSIS — H409 Unspecified glaucoma: Secondary | ICD-10-CM | POA: Diagnosis not present

## 2013-02-16 DIAGNOSIS — I2510 Atherosclerotic heart disease of native coronary artery without angina pectoris: Secondary | ICD-10-CM | POA: Diagnosis not present

## 2013-02-16 DIAGNOSIS — IMO0001 Reserved for inherently not codable concepts without codable children: Secondary | ICD-10-CM | POA: Diagnosis not present

## 2013-02-18 DIAGNOSIS — I4891 Unspecified atrial fibrillation: Secondary | ICD-10-CM | POA: Diagnosis not present

## 2013-02-18 DIAGNOSIS — H409 Unspecified glaucoma: Secondary | ICD-10-CM | POA: Diagnosis not present

## 2013-02-18 DIAGNOSIS — I2510 Atherosclerotic heart disease of native coronary artery without angina pectoris: Secondary | ICD-10-CM | POA: Diagnosis not present

## 2013-02-18 DIAGNOSIS — R269 Unspecified abnormalities of gait and mobility: Secondary | ICD-10-CM | POA: Diagnosis not present

## 2013-02-18 DIAGNOSIS — I10 Essential (primary) hypertension: Secondary | ICD-10-CM | POA: Diagnosis not present

## 2013-02-18 DIAGNOSIS — IMO0001 Reserved for inherently not codable concepts without codable children: Secondary | ICD-10-CM | POA: Diagnosis not present

## 2013-02-23 DIAGNOSIS — R269 Unspecified abnormalities of gait and mobility: Secondary | ICD-10-CM | POA: Diagnosis not present

## 2013-02-23 DIAGNOSIS — I4891 Unspecified atrial fibrillation: Secondary | ICD-10-CM | POA: Diagnosis not present

## 2013-02-23 DIAGNOSIS — H409 Unspecified glaucoma: Secondary | ICD-10-CM | POA: Diagnosis not present

## 2013-02-23 DIAGNOSIS — I2510 Atherosclerotic heart disease of native coronary artery without angina pectoris: Secondary | ICD-10-CM | POA: Diagnosis not present

## 2013-02-23 DIAGNOSIS — I10 Essential (primary) hypertension: Secondary | ICD-10-CM | POA: Diagnosis not present

## 2013-02-23 DIAGNOSIS — IMO0001 Reserved for inherently not codable concepts without codable children: Secondary | ICD-10-CM | POA: Diagnosis not present

## 2013-02-25 DIAGNOSIS — I4891 Unspecified atrial fibrillation: Secondary | ICD-10-CM | POA: Diagnosis not present

## 2013-02-25 DIAGNOSIS — I2510 Atherosclerotic heart disease of native coronary artery without angina pectoris: Secondary | ICD-10-CM | POA: Diagnosis not present

## 2013-02-25 DIAGNOSIS — IMO0001 Reserved for inherently not codable concepts without codable children: Secondary | ICD-10-CM | POA: Diagnosis not present

## 2013-02-25 DIAGNOSIS — H409 Unspecified glaucoma: Secondary | ICD-10-CM | POA: Diagnosis not present

## 2013-02-25 DIAGNOSIS — R269 Unspecified abnormalities of gait and mobility: Secondary | ICD-10-CM | POA: Diagnosis not present

## 2013-02-25 DIAGNOSIS — I10 Essential (primary) hypertension: Secondary | ICD-10-CM | POA: Diagnosis not present

## 2013-02-27 ENCOUNTER — Other Ambulatory Visit: Payer: Self-pay | Admitting: Cardiology

## 2013-03-02 DIAGNOSIS — I4891 Unspecified atrial fibrillation: Secondary | ICD-10-CM | POA: Diagnosis not present

## 2013-03-02 DIAGNOSIS — IMO0001 Reserved for inherently not codable concepts without codable children: Secondary | ICD-10-CM | POA: Diagnosis not present

## 2013-03-02 DIAGNOSIS — I10 Essential (primary) hypertension: Secondary | ICD-10-CM | POA: Diagnosis not present

## 2013-03-02 DIAGNOSIS — R269 Unspecified abnormalities of gait and mobility: Secondary | ICD-10-CM | POA: Diagnosis not present

## 2013-03-02 DIAGNOSIS — H409 Unspecified glaucoma: Secondary | ICD-10-CM | POA: Diagnosis not present

## 2013-03-02 DIAGNOSIS — I2510 Atherosclerotic heart disease of native coronary artery without angina pectoris: Secondary | ICD-10-CM | POA: Diagnosis not present

## 2013-03-04 DIAGNOSIS — I4891 Unspecified atrial fibrillation: Secondary | ICD-10-CM | POA: Diagnosis not present

## 2013-03-04 DIAGNOSIS — IMO0001 Reserved for inherently not codable concepts without codable children: Secondary | ICD-10-CM | POA: Diagnosis not present

## 2013-03-04 DIAGNOSIS — R269 Unspecified abnormalities of gait and mobility: Secondary | ICD-10-CM | POA: Diagnosis not present

## 2013-03-04 DIAGNOSIS — I10 Essential (primary) hypertension: Secondary | ICD-10-CM | POA: Diagnosis not present

## 2013-03-04 DIAGNOSIS — H409 Unspecified glaucoma: Secondary | ICD-10-CM | POA: Diagnosis not present

## 2013-03-04 DIAGNOSIS — I2510 Atherosclerotic heart disease of native coronary artery without angina pectoris: Secondary | ICD-10-CM | POA: Diagnosis not present

## 2013-03-04 DIAGNOSIS — H4011X0 Primary open-angle glaucoma, stage unspecified: Secondary | ICD-10-CM | POA: Diagnosis not present

## 2013-03-09 DIAGNOSIS — IMO0001 Reserved for inherently not codable concepts without codable children: Secondary | ICD-10-CM | POA: Diagnosis not present

## 2013-03-09 DIAGNOSIS — I2510 Atherosclerotic heart disease of native coronary artery without angina pectoris: Secondary | ICD-10-CM | POA: Diagnosis not present

## 2013-03-09 DIAGNOSIS — H409 Unspecified glaucoma: Secondary | ICD-10-CM | POA: Diagnosis not present

## 2013-03-09 DIAGNOSIS — I10 Essential (primary) hypertension: Secondary | ICD-10-CM | POA: Diagnosis not present

## 2013-03-09 DIAGNOSIS — I4891 Unspecified atrial fibrillation: Secondary | ICD-10-CM | POA: Diagnosis not present

## 2013-03-09 DIAGNOSIS — R269 Unspecified abnormalities of gait and mobility: Secondary | ICD-10-CM | POA: Diagnosis not present

## 2013-03-11 DIAGNOSIS — H409 Unspecified glaucoma: Secondary | ICD-10-CM | POA: Diagnosis not present

## 2013-03-11 DIAGNOSIS — I2510 Atherosclerotic heart disease of native coronary artery without angina pectoris: Secondary | ICD-10-CM | POA: Diagnosis not present

## 2013-03-11 DIAGNOSIS — IMO0001 Reserved for inherently not codable concepts without codable children: Secondary | ICD-10-CM | POA: Diagnosis not present

## 2013-03-11 DIAGNOSIS — I10 Essential (primary) hypertension: Secondary | ICD-10-CM | POA: Diagnosis not present

## 2013-03-11 DIAGNOSIS — I4891 Unspecified atrial fibrillation: Secondary | ICD-10-CM | POA: Diagnosis not present

## 2013-03-11 DIAGNOSIS — R269 Unspecified abnormalities of gait and mobility: Secondary | ICD-10-CM | POA: Diagnosis not present

## 2013-03-16 DIAGNOSIS — I2510 Atherosclerotic heart disease of native coronary artery without angina pectoris: Secondary | ICD-10-CM | POA: Diagnosis not present

## 2013-03-16 DIAGNOSIS — IMO0001 Reserved for inherently not codable concepts without codable children: Secondary | ICD-10-CM | POA: Diagnosis not present

## 2013-03-16 DIAGNOSIS — H409 Unspecified glaucoma: Secondary | ICD-10-CM | POA: Diagnosis not present

## 2013-03-16 DIAGNOSIS — I4891 Unspecified atrial fibrillation: Secondary | ICD-10-CM | POA: Diagnosis not present

## 2013-03-16 DIAGNOSIS — R269 Unspecified abnormalities of gait and mobility: Secondary | ICD-10-CM | POA: Diagnosis not present

## 2013-03-16 DIAGNOSIS — I10 Essential (primary) hypertension: Secondary | ICD-10-CM | POA: Diagnosis not present

## 2013-03-24 ENCOUNTER — Other Ambulatory Visit: Payer: Self-pay | Admitting: Cardiology

## 2013-03-24 ENCOUNTER — Ambulatory Visit (INDEPENDENT_AMBULATORY_CARE_PROVIDER_SITE_OTHER): Payer: Medicare Other | Admitting: Pulmonary Disease

## 2013-03-24 ENCOUNTER — Encounter: Payer: Self-pay | Admitting: Podiatrist

## 2013-03-24 ENCOUNTER — Encounter: Payer: Self-pay | Admitting: Pulmonary Disease

## 2013-03-24 ENCOUNTER — Ambulatory Visit (INDEPENDENT_AMBULATORY_CARE_PROVIDER_SITE_OTHER): Payer: Medicare Other | Admitting: Podiatrist

## 2013-03-24 VITALS — BP 100/62 | HR 70 | Temp 97.6°F | Ht 73.0 in | Wt 211.0 lb

## 2013-03-24 VITALS — Ht 73.0 in | Wt 215.0 lb

## 2013-03-24 DIAGNOSIS — M79609 Pain in unspecified limb: Secondary | ICD-10-CM

## 2013-03-24 DIAGNOSIS — M1990 Unspecified osteoarthritis, unspecified site: Secondary | ICD-10-CM

## 2013-03-24 DIAGNOSIS — N289 Disorder of kidney and ureter, unspecified: Secondary | ICD-10-CM

## 2013-03-24 DIAGNOSIS — Z8546 Personal history of malignant neoplasm of prostate: Secondary | ICD-10-CM

## 2013-03-24 DIAGNOSIS — I10 Essential (primary) hypertension: Secondary | ICD-10-CM

## 2013-03-24 DIAGNOSIS — I442 Atrioventricular block, complete: Secondary | ICD-10-CM

## 2013-03-24 DIAGNOSIS — B351 Tinea unguium: Secondary | ICD-10-CM | POA: Diagnosis not present

## 2013-03-24 DIAGNOSIS — K5010 Crohn's disease of large intestine without complications: Secondary | ICD-10-CM

## 2013-03-24 DIAGNOSIS — Z950 Presence of cardiac pacemaker: Secondary | ICD-10-CM

## 2013-03-24 DIAGNOSIS — K5731 Diverticulosis of large intestine without perforation or abscess with bleeding: Secondary | ICD-10-CM

## 2013-03-24 DIAGNOSIS — I4891 Unspecified atrial fibrillation: Secondary | ICD-10-CM

## 2013-03-24 DIAGNOSIS — I2510 Atherosclerotic heart disease of native coronary artery without angina pectoris: Secondary | ICD-10-CM | POA: Diagnosis not present

## 2013-03-24 DIAGNOSIS — F0390 Unspecified dementia without behavioral disturbance: Secondary | ICD-10-CM

## 2013-03-24 DIAGNOSIS — R269 Unspecified abnormalities of gait and mobility: Secondary | ICD-10-CM

## 2013-03-24 DIAGNOSIS — M109 Gout, unspecified: Secondary | ICD-10-CM

## 2013-03-24 NOTE — Patient Instructions (Signed)
Call if any ?'s or concerns 

## 2013-03-24 NOTE — Progress Notes (Signed)
Subjective:    Patient ID: Edward Harvey, male    DOB: 01-13-1916, 79 y.o.   MRN: [id number]  HPI 79 y/o BM here for a follow up visit... he has multiple medical problems including HBP, CAD, & complete heart block w/ pacer followed by DrWall & DrKlein;  hx of GI bleed from divertics w/ subtotal colectomy in 2003 & followed by DrPatterson;  hx SBO, inguinal hernias, gallstones;  hx prostate cancer followed by DrOttelin;  DJD & Gout;  hx subdural hematoma prev requiring burr hole, & mild senile dementia...  ~  Oct 03, 2011:  2mo ROV & Edward Harvey is here w/ his son today- his wife passed away 09/13/2022;  He appears to be doing ok- "just older & slower" as he's said before; BP controlled, breathing is ok, denies CP etc;  See prob list below>> CXR 5/13 showed stable heart size & pacer, sl hyperinflation, clear lungs, DJD spine, NAD... LABS 5/13:  Chems- ok w/ BUN=25 Creat=1.4 Uric=5.8 on Allopurinol...  ~  February 05, 2012:  2mo ROV & Edward Harvey denies any problems other than "getting older"; he notes the vision in his left eye is getting worse, on 3 diff eye drops, & he stopped driving 7/82 (Dr Nile Riggs sent him to [facility] for 2nd opinion);  BP is well controlled on Coreg, Quinapril, & Lasix;  He denies CP, palpit, ch in SOB or edema;  GI is ok w/o recent problems, but his urine in slower & we discussed trial FLOMAX 0.4mg  Qhs;  Overall he is quite remarkable for 79 y/o... We reviewed prob list, meds, xrays and labs> see below for updates >>  ~  March 19, 2012:  Add-on at pt request> he saw TP 02/26/12 c/o hoarseness & bad taste, phlegm in throat, right lower rib cage discomfort; exam was neg & he was rec to take Claritin5mg , nasal saline, heating pad to ribs , and Advil prn;  He reports that his symptoms have all abated, feeling back to baseline, "just older & slower" he reiterates...  We reviewed prob list, meds, xrays and labs> see below for updates >>   ~  June 09, 2102:  38mo ROV & Edward Harvey indicates he had some  diarrhea, saw DrPatterson & now better w/ addition of Lialda 1.2gmBid for Crohn's (but he thinks it was salads); We reviewed the following medical problems during today's office visit>>     AB> no recent URI or breathing prob reported...    HBP> on ASA81, Coreg6.25Bid, Quinapril20, Lasix20; wt is up 8# on diet to 225#; BP= 118/70...    CAD/ AVblock/ Pacer> on meds above; he denies CP, palpit, ch in dyspnea or edema...    Divertics w/ hx hemorrhage/ Hx SBO> he denies abd pain, N/V/C; recent diarrhea improved w/ Lialda.    Hx Prostate Cancer> followed by DrOttelin but we don't have recent notes...     DJD/ Gout> on Allopurinol300, OTC analgesics prn & MVI/ Vit D supplement;     Hx subdural hematoma> required burr holes back then; no known sequellae & he hasn't been falling... We reviewed prob list, meds, xrays and labs> see below for updates >>  LABS 1/14:  Chems- wnl w/ Creat=1.4 Alb=3.0;  CBC- ok w/ Hg=11.9;  TSH=1.76;  Sed=38... rec attn to diet & continue Lialda.  ~  September 08, 2012:  38mo ROV & post hosp check> He was [facility] 4/6-7/14 after he got too hot in church he says; DCSummary indicates syncope assoc w/ BUN~25, Cr~1.4, Hg~10, CXR-  NAD; given IVF & improved, no postural BP changes; he knows to avoid sodium & is taking Lasix20/d... Feels he is back to baseline- wants to restart his PT w/ [facility]- ok... We reviewed the following medical problems during today's office visit >>     AB> no recent URI or breathing prob reported...    HBP> on ASA81, Coreg6.25Bid, Quinapril20, Lasix20; wt is down 1# on diet to 224#; BP= 140/78 & he feels he is stable...    CAD/ AVblock/ Pacer> on meds above; he denies CP, palpit, ch in dyspnea or edema...    Divertics w/ hx hemorrhage/ Hx SBO> he denies abd pain, N/V/C; recent diarrhea improved w/ Lialda1.2Bid    Hx Prostate Cancer> on Flomax0.4; followed by DrOttelin but we don't have recent notes...     DJD/ Gout> on Allopurinol300, OTC analgesics prn & MVI/ Vit D  supplement;     Hx subdural hematoma> required burr holes back then; no known sequellae & he hasn't been falling... We reviewed prob list, meds, xrays and labs> see below for updates >>   ~  December 08, 2012:  62mo ROV & Edward Harvey has lost 5# down to 219# & he has less edema despite cutting his Lasix to 20mg /d last OV; he notes incr urinary freq during the day (on Flomax0.4) but denies nocturia! We discussed low sodium & incr exercise...  He saw DrWall 7/14 for f/u CAD, heart block, pacer- doing satis & pacer check is ok; he will f/u w/ DrKlein in 39mo; BP= 116/72 on his Coreg6.25Bid, Accupril20, & Lasix20;  His son says that he was c/o some diarrhea but pt denies to me & remains on Lialda 1.2gmBid...     We reviewed prob list, meds, xrays and labs> see below for updates >>  LABS 7/14:  Chems- ok w/ Cr=1.3;  CBC- okw/ Hg=11.7  ~  March 24, 2013:  3-29mo ROV & Edward Harvey CC is abd discomfort that comes and goes- burning, diarrhea, etc; he is followed by [employer] w/ divertics, hx Crohn's colitis w/ colonic bleed requiring subtotal colectomy from the ileum to the sigmoid in 2003, SBO 2011, Bilat inguinal hernias and gallstones; he takes Lialda1.2gmBid, AnusolHC cream, and Lomotil vs Miralax prn (he has GI f/u planned soon)...  He is still getting PT at home one day per week... Breathing has been OK, he denies CP & cardiac stable... Followed by DrOttelin for Urology    We reviewed prob list, meds, xrays and labs> see below for updates >> he had the 2014 Flu vaccine in Oct...          Problem List:    GLAUCOMA (ICD-365.9) - prev followed by Deland Pretty w/ bilat cataract surg & glaucoma on eye 3 diff drops as noted...  HEARING LOSS - son says he has 3 sets of hearing aides but won't wear any of them...  Hx of ASTHMATIC BRONCHITIS, ACUTE (ICD-466.0) - he has never smoked... hx recurrent bronchitic infections over the yrs w/ reactive airway component... no recent prob, and no regular meds required.  ~  CXR 5/13  showed stable hrt size & pacer, clear lungs, degen changes in Tspine, NAD.Marland Kitchen. ~  CXR 4/14 showed heart at upper lim normal, totuous Ao, pacer, clear lungs/ stable scarring at left base/ NAD...  HYPERTENSION (ICD-401.9) - on ASA 81mg /d, COREG 6.25Bid, QUINAPRIL 20mg Bid, LASIX 20mg /d... He takes his own meds & states no problems. ~  4/12:  BP= 110/74 & feeling well... he denies HA, visual changes, CP, palipit, dizziness,  syncope, change in dyspnea, etc... ~  8/12:  BP= 110/68 & continues stable... ~  12/12:  BP= 116/78 & he remains asymptomatic... ~  5/13:  BP= 126/80 & he continues to deny CP, palpit, SOB, edema, etc... ~  9/13:  BP= 126/78 & he denies CP, palpit, ch in SOB or edema... ~  10/13:  BP= 102/66 & he remains largely asymptomatic... ~  1/14:  BP= 118/70 & he continues to deny CP, palpit, ch in SOB/ edema... ~  4/14: on ASA81, Coreg6.25Bid, Quinapril20, Lasix20; wt is down 1# on diet to 224#; BP= 140/78 & he feels he is stable. ~  7/14: on ASA81, Coreg6.25Bid, Quinapril20, Lasix20; weight is down 5# to 219# & BP= 116/72  CORONARY ARTERY DISEASE (ICD-414.00) - followed by DrWall for Cardiology on above meds... pt has not had a prev cath... ~  2DEcho 4/05 showed mild conc LVH and norm LVF- improved from 2002 when EF= 45%... ~  NuclearStressTest 10/06 showed prior inferoseptal & apical infarct w/ apical AK, w/o ischemia, EF= 48%... no change from prev. ~  EKG 7/14 showed dual paced rhythm...  Hx of AV BLOCK, COMPLETE (ICD-426.0), & CARDIAC PACEMAKER IN SITU (ICD-V45.01) - he had a pacemaker placed for complete heart block initially in 1994, & this was exchanged for a new dual chamber pacer: Valene Bors DDDR model [phone] by [facility] 6/06... ~  seen by DrKlein 1/10 w/ pacer check OK, no further episodes of AFib on his monitor... not a Coumadin candidate due to age, unsteady, hx of GI bleeds, and prev subdural hematoma. ~  followed regularly by DrKlein's pacer clinic (last note 10/12 reviewed)>  stable, doing well, no changes made.  DIVERTICULOSIS OF COLON WITH HEMORRHAGE (ICD-562.12)  GASTROINTESTINAL HEMORRHAGE (ICD-578.9) - he had a subtotal colectomy w/ ileum to distal sigmoid anastomosis in Jul03 by DrNewman... ~  EGD 8/03 by Dorris Singh was normal... ~  colonoscopy 1/08 by DrPatterson showed inflammed mucosa in sm bowel prox to the ileo-colonic anastomosis, few divertics in remaining distal colon, no polyps etc...  ~  f/u colonoscopy 5/09 showed prev colectomy- anastomosis granular & bleeding ?Crohn's... also had divertics & hems... Rx'd w/ Lialda.  CROHN'S COLITIS >>  Hx of SMALL BOWEL OBSTRUCTION (ICD-560.9) > see [id number] Hospitalization by [facility]... ~  Abd Sonar 4/11 showed mult gallstones in GB, echogenic renal parenchyma c/w medical renal dis... ~  CT Abd 4/11 showed mild atx at bases, sl GB [place] thickening & ductal dilatation (known gallstones), mult cysts in spleen & kidneys, ectatic ao, bilat fat filled inguinal hernias, degen changes in spine... ~  12/13: he had f/u DrPatterson & note reviewed> hx segmental colitis assoc w/ severe diverticulosis & prev left colon resection; restarted LIALDA 1.2gm Bid for diarrhea & improved... ~  8/14: he had f/u DrPatterson w/ hx segmental colitis and divertics, occas loose stools, on Lialda2.4gm/d and rec to take benefiber sprinkled on food daily; ok to use immodium or lomotil as needed...   INGUINAL HERNIA (ICD-550.90) - he has bilat fat containing inguinal hernias seen on CT Abd in 2008...   GALLSTONES (ICD-574.20) - Sonar 4/11 showed mult gallstones filling the gallbladder, and CT Abd 4/11 showed mild extrahep biliary ductal dilatation... he was evaluated by DrPatterson/ GI, DrNewman/ Surg, DrWall/ Cards> all agreed that risk was hi & to hold off on surg unless absolutely necessary... ~  4/12 & 8/12:  He remains asymptomatic w/o abd pain, N/V, etc...  RENAL INSUFFICIENCY >> Creat in the 1.4 - 1.5 range... CARCINOMA,  PROSTATE, HX OF  (ICD-V10.46) - diagnosed in 1993 & treated w/ XRT... PSA initially ~29 and dropped to ~1 after XRT & it has remained there ever since... followed by DrOttelin- also has BPH, min obstructive symptoms, bilat hydroceles & some benign renal cysts... ~  labs 3/07 by Urology showed PSA= 1.19 ~  labs here 1/10 showed PSA= 1.37 ~  5/11: he reports f/u DrOttelin w/ incontinence symptoms Rx'd [place] XR 60mg /d & improved, so he stopped this on his own & states that he is doing satis now... ~  He continues to f/u w/ DrOttelin regularly- we do not have recent notes from Urology... ~  9/13:  He notes slower urinary stream & we decided to try [facility] 0.4mg Qhs... ~  Labs 1/14 showed BUN= 23, creat= 1.4 ~  Labs 7/14 showed BUN= 21, Creat= 1.3  DEGENERATIVE JOINT DISEASE (ICD-715.90) >> Hx of GOUT (ICD-274.9) >> he has hx of DJD and Gout treated w/ TRAMADOL Prn & ALLOPURINOL 300mg /d; also takes MVI & VIT D 1000 u daily... ~  Labs 5/13 showed Uric= 5.8  HEMATOMA, SUBDURAL (ICD-432.1) - he had a burr hole placed for subdural hematoma in the past...    Past Surgical History  Procedure Laterality Date  . Pacemaker placement    . Cataract extraction, bilateral    . Tonsillectomy  age 8  . Subtotal colectomy  12/03    Dr. Ezzard Standing  . Lipoma excision  2001    left leg    Outpatient Encounter Prescriptions as of 03/24/2013  Medication Sig Dispense Refill  . allopurinol (ZYLOPRIM) 300 MG tablet Take 300 mg by mouth daily.      Marland Kitchen aspirin 81 MG chewable tablet Chew 81 mg by mouth daily.       . brimonidine (ALPHAGAN P) 0.1 % SOLN Place 1 drop into both eyes 3 (three) times daily.       . carvedilol (COREG) 6.25 MG tablet Take 6.25 mg by mouth 2 (two) times daily with a meal.      . Cholecalciferol (VITAMIN D) 1000 UNITS capsule Take 1,000 Units by mouth daily.        . diphenoxylate-atropine (LOMOTIL) 2.5-0.025 MG per tablet Take 1 tablet by mouth 4 (four) times daily as needed for diarrhea or loose stools.       . furosemide (LASIX) 20 MG tablet TAKE 1 TABLET BY MOUTH EVERY DAY  90 tablet  1  . hydrocortisone (ANUSOL-HC) 2.5 % rectal cream Place 1 application rectally daily as needed for hemorrhoids.      . mesalamine (LIALDA) 1.2 G EC tablet Take 1.2 g by mouth 2 (two) times daily.      . Multiple Vitamins-Minerals (CENTRUM SILVER PO) Take 1 tablet by mouth daily.        . quinapril (ACCUPRIL) 20 MG tablet Take 20 mg by mouth every evening.      . tamsulosin (FLOMAX) 0.4 MG CAPS TAKE ONE CAPSULE BY MOUTH EVERY DAY AFTER SUPPER  90 capsule  1   No facility-administered encounter medications on file as of 03/24/2013.    No Known Allergies   Current Medications, Allergies, Past Medical History, Past Surgical History, Family History, and Social History were reviewed in [employer] record.    Review of Systems        See HPI - all other systems neg except as noted... The patient complains of dyspnea on exertion, peripheral edema, muscle weakness, and difficulty walking.  The patient denies anorexia, fever,  weight loss, weight gain, hoarseness, chest pain, syncope, prolonged cough, headaches, hemoptysis, abdominal pain, melena, hematochezia, severe indigestion/heartburn, hematuria, incontinence, suspicious skin lesions, transient blindness, depression, unusual weight change, abnormal bleeding, enlarged lymph nodes, and angioedema.     Objective:   Physical Exam     WD, overweight, 79 y/o BM in NAD...  GENERAL:  Alert, pleasant & cooperative;  VS- reviewed... HEENT:  [place]/AT, EOM- sl strabismus, PERRLA, EACs-clear (HOH), TMs-wnl, NOSE-clear discharge , THROAT-clear & wnl. NECK:  Supple w/ decrROM; no JVD; normal carotid impulses w/o bruits; no thyromegaly or nodules palpated; no lymphadenopathy. CHEST:  Clear to P & A; without wheezes/ rales/ or rhonchi heard;  Pacer palp in right chest... HEART:  Regular Rhythm; without murmurs/ rubs/ or gallops detected... ABDOMEN:  Scar of  prev surg, soft & nontender; normal bowel sounds; no organomegaly or masses palpated...  EXT:  Mod-severe arthritic changes; no varicose veins/ +venous insuffic/ tr edema. NEURO:  CN's intact; motor testing normal; sensory testing inconsistant; gait is abnormal & balance only fair... DERM:  No lesions noted; no rash etc...  RADIOLOGY DATA:  Reviewed in the EPIC EMR & discussed w/ the patient...  LABORATORY DATA:  Reviewed in the EPIC EMR & discussed w/ the patient...   Assessment & Plan:    HBP>  BP stable, continue same meds...  CAD, PACER>  He denies angina, palpit, syncope, etc;  Continue cardiology f/u w/ DrTomWall & DrKlein.  Divertics, Crohn's>  He has hx GI hemorrhage in past w/ part colectomy; Hx Crohn's colitis w/ diarrhea- improved on Lialda...  Gallstones>  He remains asymptomatic...  Prostate Ca>  Followed by DrOttelin & stable...  DJD>  This is his main issue w/ mobility, self care, remaining independent...  Hx Subdural Hematoma>  Stable w/o acute problems.Marland KitchenMarland Kitchen

## 2013-03-24 NOTE — Patient Instructions (Signed)
Today we updated your med list in our EPIC system...    Continue your current medications the same...  Call for any questions...  Let's plan a follow up visit in 4mo, sooner if needed for problems...   

## 2013-03-24 NOTE — Progress Notes (Signed)
  Subjective:    Patient ID: Edward Harvey, male    DOB: [birth year], 79 y.o.   MRN: [id number]  HPI trim nails    Review of Systems  Constitutional: Negative.   HENT: Positive for hearing loss.   Eyes:       Left eye glaucoma  Respiratory: Negative.   Cardiovascular: Negative.   Gastrointestinal: Positive for diarrhea.  Endocrine: Negative.   Genitourinary: Negative.   Musculoskeletal: Negative.   Skin: Negative.   Allergic/Immunologic: Negative.   Neurological: Negative.   Hematological: Negative.   Psychiatric/Behavioral: Negative.        Objective:   Physical Exam   Objective:  Patients chart is reviewed.  Neurovascular status unchanged.  Patients nails are thickened, discolored, distrophic, friable and brittle with yellow-brown discoloration. Patient subjectively relates they are painful with shoes and with ambulation.both feet          Assessment & Plan:  Assessment:  Symptomatic onychomycosis  Plan:  Discussed treatment options and alternatives.  The symptomatic toenails were debrided through manual an mechanical means without complication.  Return appointment recommended at routine intervals of 3 months

## 2013-03-26 ENCOUNTER — Encounter: Payer: Self-pay | Admitting: Gastroenterology

## 2013-03-26 ENCOUNTER — Ambulatory Visit (INDEPENDENT_AMBULATORY_CARE_PROVIDER_SITE_OTHER): Payer: Medicare Other | Admitting: Gastroenterology

## 2013-03-26 VITALS — BP 100/60 | HR 74 | Ht 68.21 in | Wt 209.5 lb

## 2013-03-26 DIAGNOSIS — K648 Other hemorrhoids: Secondary | ICD-10-CM

## 2013-03-26 DIAGNOSIS — K5010 Crohn's disease of large intestine without complications: Secondary | ICD-10-CM | POA: Diagnosis not present

## 2013-03-26 DIAGNOSIS — K649 Unspecified hemorrhoids: Secondary | ICD-10-CM

## 2013-03-26 DIAGNOSIS — K644 Residual hemorrhoidal skin tags: Secondary | ICD-10-CM

## 2013-03-26 DIAGNOSIS — K625 Hemorrhage of anus and rectum: Secondary | ICD-10-CM | POA: Diagnosis not present

## 2013-03-26 NOTE — Patient Instructions (Signed)
Please follow up as needed or if symptoms reoccur

## 2013-03-26 NOTE — Progress Notes (Signed)
This is a very pleasant 79 year old African American male with segmental colitis and diverticulosis.  His had recurrent problems with rectal bleeding which responded to oral amino salicylates and occasional topical amino salicylates.  He currently is doing well but has had a hard bump-lump in his perirectal area which has resolved, and apparently did have a small amount of bleeding.  He currently is asymptomatic.  Recent stool exam for C. difficile was negative.  He denies abdominal pain or other gastrointestinal symptoms.  His appetite is good and his weight is stable  Current Medications, Allergies, Past Medical History, Past Surgical History, Family History and Social History were reviewed in [employer] record.  ROS: All systems were reviewed and are negative unless otherwise stated in the HPI.          Physical Exam: Blood pressure 100/60, pulse 74 and regular and weight 209.  Abdominal exam is entirely unremarkable.  Inspection the rectum shows a skin tag the posterior buttocks area at least 3-4 inches from the anal verge.  It has appearance of a possible anal wart.  Digital exam of the rectum otherwise is unremarkable as is and endoscopy.  I can see no large bleeding hemorrhoids, fissures, fistula, or evidence of colitis.  Rectal exam otherwise shows no masses or tenderness.  The patient mental status is normal    Assessment and Plan: Probable thrombosed internal hemorrhoid which has resolved spontaneously.  He does have a anal wart several centimeters from anal verge which is probably causing him some tenderness, but I would not advise surgical intervention at this time.  His colitis is doing well on his daily amino salicylate mesalamine 1.2 g twice a day, and he does use when necessary hydrocortisone cream locally.  We'll see him on a when necessary basis as needed.

## 2013-03-30 DIAGNOSIS — I10 Essential (primary) hypertension: Secondary | ICD-10-CM | POA: Diagnosis not present

## 2013-03-30 DIAGNOSIS — H409 Unspecified glaucoma: Secondary | ICD-10-CM | POA: Diagnosis not present

## 2013-03-30 DIAGNOSIS — IMO0001 Reserved for inherently not codable concepts without codable children: Secondary | ICD-10-CM | POA: Diagnosis not present

## 2013-03-30 DIAGNOSIS — R269 Unspecified abnormalities of gait and mobility: Secondary | ICD-10-CM | POA: Diagnosis not present

## 2013-03-30 DIAGNOSIS — I2510 Atherosclerotic heart disease of native coronary artery without angina pectoris: Secondary | ICD-10-CM | POA: Diagnosis not present

## 2013-03-30 DIAGNOSIS — I4891 Unspecified atrial fibrillation: Secondary | ICD-10-CM | POA: Diagnosis not present

## 2013-04-07 ENCOUNTER — Other Ambulatory Visit: Payer: Self-pay | Admitting: Pulmonary Disease

## 2013-05-26 ENCOUNTER — Encounter (HOSPITAL_COMMUNITY): Payer: Self-pay | Admitting: Emergency Medicine

## 2013-05-26 ENCOUNTER — Emergency Department (HOSPITAL_COMMUNITY): Payer: Medicare Other

## 2013-05-26 ENCOUNTER — Inpatient Hospital Stay (HOSPITAL_COMMUNITY)
Admission: EM | Admit: 2013-05-26 | Discharge: 2013-05-29 | DRG: 641 | Disposition: A | Discharge status: Home Health | Payer: Medicare Other | Attending: Internal Medicine | Admitting: Internal Medicine

## 2013-05-26 DIAGNOSIS — Z950 Presence of cardiac pacemaker: Secondary | ICD-10-CM | POA: Diagnosis not present

## 2013-05-26 DIAGNOSIS — R197 Diarrhea, unspecified: Secondary | ICD-10-CM | POA: Diagnosis not present

## 2013-05-26 DIAGNOSIS — M109 Gout, unspecified: Secondary | ICD-10-CM | POA: Diagnosis present

## 2013-05-26 DIAGNOSIS — K5010 Crohn's disease of large intestine without complications: Secondary | ICD-10-CM | POA: Diagnosis present

## 2013-05-26 DIAGNOSIS — I9589 Other hypotension: Secondary | ICD-10-CM | POA: Diagnosis present

## 2013-05-26 DIAGNOSIS — R269 Unspecified abnormalities of gait and mobility: Secondary | ICD-10-CM | POA: Diagnosis not present

## 2013-05-26 DIAGNOSIS — E860 Dehydration: Principal | ICD-10-CM | POA: Diagnosis present

## 2013-05-26 DIAGNOSIS — I959 Hypotension, unspecified: Secondary | ICD-10-CM | POA: Diagnosis not present

## 2013-05-26 DIAGNOSIS — I129 Hypertensive chronic kidney disease with stage 1 through stage 4 chronic kidney disease, or unspecified chronic kidney disease: Secondary | ICD-10-CM | POA: Diagnosis present

## 2013-05-26 DIAGNOSIS — N189 Chronic kidney disease, unspecified: Secondary | ICD-10-CM | POA: Diagnosis present

## 2013-05-26 DIAGNOSIS — H9190 Unspecified hearing loss, unspecified ear: Secondary | ICD-10-CM | POA: Diagnosis present

## 2013-05-26 DIAGNOSIS — R4182 Altered mental status, unspecified: Secondary | ICD-10-CM | POA: Diagnosis not present

## 2013-05-26 DIAGNOSIS — R5381 Other malaise: Secondary | ICD-10-CM | POA: Diagnosis present

## 2013-05-26 DIAGNOSIS — I4891 Unspecified atrial fibrillation: Secondary | ICD-10-CM | POA: Diagnosis not present

## 2013-05-26 DIAGNOSIS — N289 Disorder of kidney and ureter, unspecified: Secondary | ICD-10-CM

## 2013-05-26 DIAGNOSIS — I2510 Atherosclerotic heart disease of native coronary artery without angina pectoris: Secondary | ICD-10-CM | POA: Diagnosis present

## 2013-05-26 DIAGNOSIS — Z8546 Personal history of malignant neoplasm of prostate: Secondary | ICD-10-CM | POA: Diagnosis not present

## 2013-05-26 DIAGNOSIS — H409 Unspecified glaucoma: Secondary | ICD-10-CM | POA: Diagnosis present

## 2013-05-26 DIAGNOSIS — I10 Essential (primary) hypertension: Secondary | ICD-10-CM

## 2013-05-26 DIAGNOSIS — I442 Atrioventricular block, complete: Secondary | ICD-10-CM

## 2013-05-26 DIAGNOSIS — R55 Syncope and collapse: Secondary | ICD-10-CM | POA: Diagnosis present

## 2013-05-26 DIAGNOSIS — Z7982 Long term (current) use of aspirin: Secondary | ICD-10-CM | POA: Diagnosis not present

## 2013-05-26 DIAGNOSIS — M19019 Primary osteoarthritis, unspecified shoulder: Secondary | ICD-10-CM | POA: Diagnosis present

## 2013-05-26 DIAGNOSIS — M1990 Unspecified osteoarthritis, unspecified site: Secondary | ICD-10-CM

## 2013-05-26 DIAGNOSIS — N179 Acute kidney failure, unspecified: Secondary | ICD-10-CM | POA: Diagnosis not present

## 2013-05-26 DIAGNOSIS — I509 Heart failure, unspecified: Secondary | ICD-10-CM | POA: Diagnosis not present

## 2013-05-26 HISTORY — DX: Presence of cardiac pacemaker: Z95.0

## 2013-05-26 LAB — COMPREHENSIVE METABOLIC PANEL
ALT: 16 U/L (ref 0–53)
Albumin: 3.1 g/dL — ABNORMAL LOW (ref 3.5–5.2)
Alkaline Phosphatase: 94 U/L (ref 39–117)
BUN: 25 mg/dL — ABNORMAL HIGH (ref 6–23)
CO2: 22 mEq/L (ref 19–32)
Chloride: 103 mEq/L (ref 96–112)
Creatinine, Ser: 1.73 mg/dL — ABNORMAL HIGH (ref 0.50–1.35)
GFR calc Af Amer: 37 mL/min — ABNORMAL LOW (ref >90)
GFR calc non Af Amer: 32 mL/min — ABNORMAL LOW (ref >90)
Glucose, Bld: 101 mg/dL — ABNORMAL HIGH (ref 70–99)
Potassium: 4.8 mEq/L (ref 3.5–5.1)
Sodium: 136 mEq/L (ref 135–145)
Total Bilirubin: 0.5 mg/dL (ref 0.3–1.2)
Total Protein: 7.6 g/dL (ref 6.0–8.3)

## 2013-05-26 LAB — CBC WITH DIFFERENTIAL/PLATELET
Basophils Absolute: 0 10*3/uL (ref 0.0–0.1)
Basophils Relative: 0 % (ref 0–1)
Eosinophils Absolute: 0 10*3/uL (ref 0.0–0.7)
Hemoglobin: 12.2 g/dL — ABNORMAL LOW (ref 13.0–17.0)
Lymphocytes Relative: 10 % — ABNORMAL LOW (ref 12–46)
MCH: 34.8 pg — ABNORMAL HIGH (ref 26.0–34.0)
MCHC: 34.5 g/dL (ref 30.0–36.0)
Monocytes Absolute: 1.7 10*3/uL — ABNORMAL HIGH (ref 0.1–1.0)
Neutrophils Relative %: 79 % — ABNORMAL HIGH (ref 43–77)
RDW: 14.5 % (ref 11.5–15.5)
WBC: 15.1 10*3/uL — ABNORMAL HIGH (ref 4.0–10.5)

## 2013-05-26 LAB — URINALYSIS, ROUTINE W REFLEX MICROSCOPIC
Glucose, UA: NEGATIVE mg/dL
Hgb urine dipstick: NEGATIVE
Ketones, ur: 15 mg/dL — AB
Protein, ur: NEGATIVE mg/dL

## 2013-05-26 LAB — PRO B NATRIURETIC PEPTIDE: Pro B Natriuretic peptide (BNP): 278.5 pg/mL (ref 0–450)

## 2013-05-26 MED ORDER — SODIUM CHLORIDE 0.9 % IV SOLN
INTRAVENOUS | Status: AC
  Administered 2013-05-26: via INTRAVENOUS

## 2013-05-26 MED ORDER — SODIUM CHLORIDE 0.9 % IV SOLN
INTRAVENOUS | Status: DC
  Administered 2013-05-26 – 2013-05-27 (×2): via INTRAVENOUS

## 2013-05-26 MED ORDER — VANCOMYCIN HCL IN DEXTROSE 1-5 GM/200ML-% IV SOLN
1000.0000 mg | INTRAVENOUS | Status: DC
  Administered 2013-05-27: 1000 mg via INTRAVENOUS
  Filled 2013-05-26: qty 200

## 2013-05-26 MED ORDER — PIPERACILLIN-TAZOBACTAM 3.375 G IVPB 30 MIN
3.3750 g | Freq: Once | INTRAVENOUS | Status: AC
  Administered 2013-05-26: 3.375 g via INTRAVENOUS
  Filled 2013-05-26: qty 50

## 2013-05-26 MED ORDER — PIPERACILLIN-TAZOBACTAM 3.375 G IVPB
3.3750 g | Freq: Three times a day (TID) | INTRAVENOUS | Status: DC
  Administered 2013-05-27 – 2013-05-28 (×4): 3.375 g via INTRAVENOUS
  Filled 2013-05-26 (×5): qty 50

## 2013-05-26 MED ORDER — VANCOMYCIN HCL IN DEXTROSE 1-5 GM/200ML-% IV SOLN
1000.0000 mg | Freq: Once | INTRAVENOUS | Status: AC
  Administered 2013-05-26: 1000 mg via INTRAVENOUS
  Filled 2013-05-26: qty 200

## 2013-05-26 MED ORDER — ONDANSETRON HCL 4 MG/2ML IJ SOLN
4.0000 mg | Freq: Once | INTRAMUSCULAR | Status: AC
  Administered 2013-05-26: 4 mg via INTRAVENOUS
  Filled 2013-05-26: qty 2

## 2013-05-26 MED ORDER — SODIUM CHLORIDE 0.9 % IV BOLUS (SEPSIS)
1000.0000 mL | Freq: Once | INTRAVENOUS | Status: AC
  Administered 2013-05-26: 1000 mL via INTRAVENOUS

## 2013-05-26 MED ORDER — VANCOMYCIN HCL IN DEXTROSE 1-5 GM/200ML-% IV SOLN: 1000.0000 mg | Freq: Once | INTRAVENOUS | Status: AC

## 2013-05-26 NOTE — ED Notes (Signed)
Attempted to give report 

## 2013-05-26 NOTE — ED Provider Notes (Addendum)
CSN: [id number]     Arrival date & time 05/26/13  2038 History   First MD Initiated Contact with Patient 05/26/13 2053     Chief Complaint  Patient presents with  . Loss of Consciousness  . Hypotension  . Diarrhea   (Consider location/radiation/quality/duration/timing/severity/associated sxs/prior Treatment) The history is provided by the patient.   79 year old male brought in by EMS from home. Patient had a syncopal episode while sitting that lasted about 2 minutes. Patient's also had diarrhea stools today. Family reported that he shook a little and was breathing funny when he passed out. When EMS arrived he was alert and oriented patient's blood pressure initially was 134 systolic but did drop by EMS down to 90. Patient without any other complaints earlier today.  Past Medical History  Diagnosis Date  . Glaucoma   . Acute bronchitis   . Hypertension   . CAD (coronary artery disease)     nuclear stress test 2006 inferoseptal and apical infarct ejection fraction 48%  . Atrioventricular block, complete   . Cardiac pacemaker in situ   . Diverticulosis of colon with hemorrhage   . Hemorrhage of gastrointestinal tract, unspecified   . Unspecified intestinal obstruction   . Inguinal hernia   . Gallstone   . DJD (degenerative joint disease)   . Gout   . Hematoma     subdural  . Prostate cancer   . Internal hemorrhoids without mention of complication    Past Surgical History  Procedure Laterality Date  . Pacemaker placement    . Cataract extraction, bilateral    . Tonsillectomy  age 31  . Subtotal colectomy  12/03    Dr. Ezzard Standing  . Lipoma excision  2001    left leg   No family history on file. History  Substance Use Topics  . Smoking status: Never Smoker   . Smokeless tobacco: Never Used  . Alcohol Use: No     Comment: Occ wine     Review of Systems  Constitutional: Positive for fatigue. Negative for fever.  HENT: Negative for congestion.   Eyes: Negative for redness.   Respiratory: Negative for shortness of breath.   Cardiovascular: Negative for chest pain.  Gastrointestinal: Positive for diarrhea. Negative for nausea, vomiting, abdominal pain and blood in stool.  Genitourinary: Negative for dysuria.  Musculoskeletal: Negative for myalgias.  Skin: Negative for rash.  Neurological: Negative for headaches.  Psychiatric/Behavioral: Negative for confusion.    Allergies  Review of patient's allergies indicates no known allergies.  Home Medications   Current Outpatient Rx  Name  Route  Sig  Dispense  Refill  . allopurinol (ZYLOPRIM) 300 MG tablet   Oral   Take 300 mg by mouth daily.         Marland Kitchen aspirin 81 MG chewable tablet   Oral   Chew 81 mg by mouth daily.          . brimonidine (ALPHAGAN P) 0.1 % SOLN   Both Eyes   Place 1 drop into both eyes 3 (three) times daily.          . carvedilol (COREG) 6.25 MG tablet   Oral   Take 6.25 mg by mouth 2 (two) times daily with a meal.         . Cholecalciferol (VITAMIN D) 1000 UNITS capsule   Oral   Take 1,000 Units by mouth daily.           . diphenoxylate-atropine (LOMOTIL) 2.5-0.025 MG per tablet  Oral   Take 1 tablet by mouth 4 (four) times daily as needed for diarrhea or loose stools.         . furosemide (LASIX) 20 MG tablet      TAKE 1 TABLET BY MOUTH EVERY DAY   90 tablet   1   . hydrocortisone (ANUSOL-HC) 2.5 % rectal cream   Rectal   Place 1 application rectally daily as needed for hemorrhoids.         . mesalamine (LIALDA) 1.2 G EC tablet   Oral   Take 1.2 g by mouth 2 (two) times daily.         . Multiple Vitamins-Minerals (CENTRUM SILVER PO)   Oral   Take 1 tablet by mouth daily.           . quinapril (ACCUPRIL) 20 MG tablet   Oral   Take 20 mg by mouth every evening.         . tamsulosin (FLOMAX) 0.4 MG CAPS      TAKE ONE CAPSULE BY MOUTH EVERY DAY AFTER SUPPER   90 capsule   1    BP 112/49  Pulse 73  Temp(Src) 98 F (36.7 C) (Oral)  Resp  26  SpO2 100% Physical Exam  Nursing note and vitals reviewed. Constitutional: He is oriented to person, place, and time. He appears well-developed and well-nourished. He appears distressed.  HENT:  Head: Normocephalic and atraumatic.  Mucous membranes dry  Eyes: Conjunctivae and EOM are normal. Pupils are equal, round, and reactive to light.  Neck: Normal range of motion.  Cardiovascular: Normal rate, regular rhythm and normal heart sounds.   Pulmonary/Chest: Effort normal and breath sounds normal. No respiratory distress.  Abdominal: Bowel sounds are normal. There is no tenderness.  Musculoskeletal: Normal range of motion.  Neurological: He is alert and oriented to person, place, and time. No cranial nerve deficit. He exhibits normal muscle tone. Coordination normal.  Skin: Skin is warm. No rash noted.    ED Course  Procedures (including critical care time) Labs Review Labs Reviewed  COMPREHENSIVE METABOLIC PANEL - Abnormal; Notable for the following:    Glucose, Bld 101 (*)    BUN 25 (*)    Creatinine, Ser 1.73 (*)    Albumin 3.1 (*)    GFR calc non Af Amer 32 (*)    GFR calc Af Amer 37 (*)    All other components within normal limits  LIPASE, BLOOD - Abnormal; Notable for the following:    Lipase 85 (*)    All other components within normal limits  CBC WITH DIFFERENTIAL - Abnormal; Notable for the following:    WBC 15.1 (*)    RBC 3.51 (*)    Hemoglobin 12.2 (*)    HCT 35.4 (*)    MCV 100.9 (*)    MCH 34.8 (*)    Neutrophils Relative % 79 (*)    Lymphocytes Relative 10 (*)    Neutro Abs 11.9 (*)    Monocytes Absolute 1.7 (*)    All other components within normal limits  URINALYSIS, ROUTINE W REFLEX MICROSCOPIC - Abnormal; Notable for the following:    Color, Urine AMBER (*)    APPearance HAZY (*)    Bilirubin Urine SMALL (*)    Ketones, ur 15 (*)    All other components within normal limits  CULTURE, BLOOD (ROUTINE X 2)  CULTURE, BLOOD (ROUTINE X 2)   CLOSTRIDIUM DIFFICILE BY PCR  TROPONIN I  PRO B NATRIURETIC  PEPTIDE  CG4 I-STAT (LACTIC ACID)   Results for orders placed during the hospital encounter of 05/26/13  TROPONIN I      Result Value Range   Troponin I <0.30  <0.30 ng/mL  COMPREHENSIVE METABOLIC PANEL      Result Value Range   Sodium 136  135 - 145 mEq/L   Potassium 4.8  3.5 - 5.1 mEq/L   Chloride 103  96 - 112 mEq/L   CO2 22  19 - 32 mEq/L   Glucose, Bld 101 (*) 70 - 99 mg/dL   BUN 25 (*) 6 - 23 mg/dL   Creatinine, Ser 1.61 (*) 0.50 - 1.35 mg/dL   Calcium 8.6  8.4 - 09.6 mg/dL   Total Protein 7.6  6.0 - 8.3 g/dL   Albumin 3.1 (*) 3.5 - 5.2 g/dL   AST 27  0 - 37 U/L   ALT 16  0 - 53 U/L   Alkaline Phosphatase 94  39 - 117 U/L   Total Bilirubin 0.5  0.3 - 1.2 mg/dL   GFR calc non Af Amer 32 (*) >90 mL/min   GFR calc Af Amer 37 (*) >90 mL/min  LIPASE, BLOOD      Result Value Range   Lipase 85 (*) 11 - 59 U/L  CBC WITH DIFFERENTIAL      Result Value Range   WBC 15.1 (*) 4.0 - 10.5 K/uL   RBC 3.51 (*) 4.22 - 5.81 MIL/uL   Hemoglobin 12.2 (*) 13.0 - 17.0 g/dL   HCT 04.5 (*) 40.9 - 81.1 %   MCV 100.9 (*) 78.0 - 100.0 fL   MCH 34.8 (*) 26.0 - 34.0 pg   MCHC 34.5  30.0 - 36.0 g/dL   RDW 91.4  78.2 - 95.6 %   Platelets 160  150 - 400 K/uL   Neutrophils Relative % 79 (*) 43 - 77 %   Lymphocytes Relative 10 (*) 12 - 46 %   Monocytes Relative 11  3 - 12 %   Eosinophils Relative 0  0 - 5 %   Basophils Relative 0  0 - 1 %   Neutro Abs 11.9 (*) 1.7 - 7.7 K/uL   Lymphs Abs 1.5  0.7 - 4.0 K/uL   Monocytes Absolute 1.7 (*) 0.1 - 1.0 K/uL   Eosinophils Absolute 0.0  0.0 - 0.7 K/uL   Basophils Absolute 0.0  0.0 - 0.1 K/uL   WBC Morphology MILD LEFT SHIFT (1-5% METAS, OCC MYELO, OCC BANDS)    PRO B NATRIURETIC PEPTIDE      Result Value Range   Pro B Natriuretic peptide (BNP) 278.5  0 - 450 pg/mL  URINALYSIS, ROUTINE W REFLEX MICROSCOPIC      Result Value Range   Color, Urine AMBER (*) YELLOW   APPearance HAZY (*)  CLEAR   Specific Gravity, Urine 1.022  1.005 - 1.030   pH 5.0  5.0 - 8.0   Glucose, UA NEGATIVE  NEGATIVE mg/dL   Hgb urine dipstick NEGATIVE  NEGATIVE   Bilirubin Urine SMALL (*) NEGATIVE   Ketones, ur 15 (*) NEGATIVE mg/dL   Protein, ur NEGATIVE  NEGATIVE mg/dL   Urobilinogen, UA 0.2  0.0 - 1.0 mg/dL   Nitrite NEGATIVE  NEGATIVE   Leukocytes, UA NEGATIVE  NEGATIVE  CG4 I-STAT (LACTIC ACID)      Result Value Range   Lactic Acid, Venous 2.14  0.5 - 2.2 mmol/L    Imaging Review Ct Head Wo Contrast  05/26/2013  CLINICAL DATA:  Syncope.  EXAM: CT HEAD WITHOUT CONTRAST  TECHNIQUE: Contiguous axial images were obtained from the base of the skull through the vertex without intravenous contrast.  COMPARISON:  None.  FINDINGS: The brain is atrophic. No evidence of acute abnormality including infarct, hemorrhage, mass lesion, mass effect, midline shift or abnormal extra-axial fluid collection. Marked mucosal thickening right maxillary sinus is identified. The calvarium is intact with bilateral burr holes noted.  IMPRESSION: No acute abnormality.  Atrophy.  Right maxillary sinus disease.   Electronically Signed   By: Drusilla Kanner M.D.   On: 05/26/2013 21:57   Dg Chest Port 1 View  05/26/2013   CLINICAL DATA:  Loss of consciousness.  Weakness.  EXAM: PORTABLE CHEST - 1 VIEW  COMPARISON:  09/06/2012.  FINDINGS: Cardiomegaly is present with pulmonary vascular congestion and interstitial pulmonary edema. Low volumes with basilar atelectasis. Monitoring leads project over the chest. Unchanged dual lead right subclavian cardiac pacemaker. Right-greater-than-left glenohumeral osteoarthritis.  IMPRESSION: Constellation of findings compatible with mild CHF. Low volume chest.   Electronically Signed   By: Andreas Newport M.D.   On: 05/26/2013 22:20    EKG Interpretation   None      Date: 05/26/2013  Rate: 72  Rhythm: normal sinus rhythm  QRS Axis: left  Intervals: Ventricular pace  ST/T Wave  abnormalities: nonspecific ST/T changes  Conduction Disutrbances:Ventricular paced  Narrative Interpretation:   Old EKG Reviewed: unchanged Patient has a ventricular pacemaker no significant change in EKG compared to 09/06/2012. Today's does have some artifact otherwise unchanged  CRITICAL CARE Performed by: Shelda Jakes. Total critical care time: 30 Critical care time was exclusive of separately billable procedures and treating other patients. Critical care was necessary to treat or prevent imminent or life-threatening deterioration. Critical care was time spent personally by me on the following activities: development of treatment plan with patient and/or surrogate as well as nursing, discussions with consultants, evaluation of patient's response to treatment, examination of patient, obtaining history from patient or surrogate, ordering and performing treatments and interventions, ordering and review of laboratory studies, ordering and review of radiographic studies, pulse oximetry and re-evaluation of patient's condition.   MDM   1. Hypotension   2. Diarrhea   3. Syncope    Patient with the episodes of hypotension low as systolic was 85. This happened twice even after the first liter fluid patient had a transient drop in blood pressure now back up to above 100. Like the gas it is negative for sepsis. However the patient is elderly was apparently fine to started some diarrhea-type illness today. Family states it is often a little bit dehydrated. A Foley was placed minimal urine output. Concentrated. Blood cultures done urine culture done of septic of antibiotic started C. differential PRC done.  Patient's abdomen is soft do not expect an acute abdominal process. Chest x-ray negative for pneumonia but after the fluid raises some concern of perhaps developing pulmonary edema will have to be followed carefully. EKG is a ventricular paced rhythm obviously unchanged in the  previous.  Patient will require step down admission. Patient received the antibiotics as per septic protocol.  Patient very hard of hearing the review of systems somewhat difficult. But is alert and oriented when you work through the hard of hearing.   Suspect patient's syncopal episode was related to the hypotension. As stated the patient received 1 L of normal saline for hypotensive blood pressure shortly after he arrived he was around 87 systolic. Blood pressure  improved he then transiently dropped again down into the 82nd liter of fluid provided.  Shelda Jakes, MD 05/26/13 1610  Shelda Jakes, MD 05/26/13 2312

## 2013-05-26 NOTE — Progress Notes (Signed)
ANTIBIOTIC CONSULT NOTE - INITIAL  Pharmacy Consult for Vancomycin and Zosyn Indication: rule out sepsis  No Known Allergies  Vital Signs: Temp: 98 F (36.7 C) (12/24 2045) Temp src: Oral (12/24 2045) BP: 85/48 mmHg (12/24 2045) Pulse Rate: 71 (12/24 2045) Intake/Output from previous day:   Intake/Output from this shift: Total I/O In: 1000 [I.V.:1000] Out: -   Labs:  Recent Labs  05/26/13 2128  WBC 15.1*  HGB 12.2*  PLT 160  CREATININE 1.73*   The CrCl is unknown because both a height and weight (above a minimum accepted value) are required for this calculation. No results found for this basename: VANCOTROUGH, VANCOPEAK, VANCORANDOM, GENTTROUGH, GENTPEAK, GENTRANDOM, TOBRATROUGH, TOBRAPEAK, TOBRARND, AMIKACINPEAK, AMIKACINTROU, AMIKACIN,  in the last 72 hours   Microbiology: No results found for this or any previous visit (from the past 720 hour(s)).  Medical History: Past Medical History  Diagnosis Date  . Glaucoma   . Acute bronchitis   . Hypertension   . CAD (coronary artery disease)     nuclear stress test 2006 inferoseptal and apical infarct ejection fraction 48%  . Atrioventricular block, complete   . Cardiac pacemaker in situ   . Diverticulosis of colon with hemorrhage   . Hemorrhage of gastrointestinal tract, unspecified   . Unspecified intestinal obstruction   . Inguinal hernia   . Gallstone   . DJD (degenerative joint disease)   . Gout   . Hematoma     subdural  . Prostate cancer   . Internal hemorrhoids without mention of complication     Assessment: 79 yo M presenting with possible sepsis. Pharmacy to dose Vanc + Zosyn. Last weight 95 kg, SCr 1.7 (CrCl ~29 ml/min).  Goal of Therapy:  Vancomycin trough level 15-20 mcg/ml  Plan:  - Vanc 1 gm IV x 1 (to complete 2 gm load), then continue 1 gm IV daily - Zosyn 3.375 gm IV q8h (4hr infusion) - Monitor temp, WBC, renal function, C&S, levels prn

## 2013-05-26 NOTE — ED Notes (Signed)
Pt is from home, EMS was called b/c the pt had a syncopal episode lasting about 2 minutes while sitting in his chair, per EMS pt's daughter reports the pt was sitting in the chair when he passed out and shook a little, was breathing funny and was unresponsive. Upon EMS arrival pt was A&O X4 but was incontinent. Pt's BP initially was 134 palpable and dropped to 90/60 en route to department and maintained throughout the duration of the time with EMS. EMS unable to obtain pt's medical hx.

## 2013-05-26 NOTE — ED Notes (Signed)
Lactic Acid reported to Dr.Zackowski

## 2013-05-26 NOTE — ED Notes (Signed)
Duplicate order discontinued.  

## 2013-05-27 ENCOUNTER — Encounter (HOSPITAL_COMMUNITY): Payer: Self-pay | Admitting: Internal Medicine

## 2013-05-27 DIAGNOSIS — I959 Hypotension, unspecified: Secondary | ICD-10-CM

## 2013-05-27 DIAGNOSIS — K5010 Crohn's disease of large intestine without complications: Secondary | ICD-10-CM

## 2013-05-27 DIAGNOSIS — R197 Diarrhea, unspecified: Secondary | ICD-10-CM

## 2013-05-27 DIAGNOSIS — R55 Syncope and collapse: Secondary | ICD-10-CM

## 2013-05-27 LAB — COMPREHENSIVE METABOLIC PANEL
BUN: 21 mg/dL (ref 6–23)
CO2: 20 mEq/L (ref 19–32)
Calcium: 7.9 mg/dL — ABNORMAL LOW (ref 8.4–10.5)
Creatinine, Ser: 1.36 mg/dL — ABNORMAL HIGH (ref 0.50–1.35)
GFR calc Af Amer: 49 mL/min — ABNORMAL LOW (ref >90)
GFR calc non Af Amer: 43 mL/min — ABNORMAL LOW (ref >90)
Glucose, Bld: 97 mg/dL (ref 70–99)
Sodium: 137 mEq/L (ref 135–145)

## 2013-05-27 LAB — CBC WITH DIFFERENTIAL/PLATELET
Basophils Absolute: 0 10*3/uL (ref 0.0–0.1)
Basophils Relative: 0 % (ref 0–1)
Eosinophils Relative: 0 % (ref 0–5)
Hemoglobin: 10.6 g/dL — ABNORMAL LOW (ref 13.0–17.0)
Lymphs Abs: 1.6 10*3/uL (ref 0.7–4.0)
Monocytes Relative: 14 % — ABNORMAL HIGH (ref 3–12)
Neutro Abs: 5.7 10*3/uL (ref 1.7–7.7)
Neutrophils Relative %: 67 % (ref 43–77)
Platelets: 144 10*3/uL — ABNORMAL LOW (ref 150–400)
RBC: 3.16 MIL/uL — ABNORMAL LOW (ref 4.22–5.81)
RDW: 14.4 % (ref 11.5–15.5)

## 2013-05-27 LAB — LACTIC ACID, PLASMA: Lactic Acid, Venous: 0.8 mmol/L (ref 0.5–2.2)

## 2013-05-27 LAB — MRSA PCR SCREENING: MRSA by PCR: NEGATIVE

## 2013-05-27 LAB — TSH: TSH: 0.628 u[IU]/mL (ref 0.350–4.500)

## 2013-05-27 LAB — TROPONIN I: Troponin I: <0.3 ng/mL (ref <0.30)

## 2013-05-27 LAB — CLOSTRIDIUM DIFFICILE BY PCR: Toxigenic C. Difficile by PCR: NEGATIVE

## 2013-05-27 MED ORDER — ALLOPURINOL 300 MG PO TABS
300.0000 mg | ORAL_TABLET | Freq: Every day | ORAL | Status: DC
  Administered 2013-05-27 – 2013-05-28 (×2): 300 mg via ORAL
  Filled 2013-05-27 (×3): qty 1

## 2013-05-27 MED ORDER — ASPIRIN 81 MG PO CHEW
81.0000 mg | CHEWABLE_TABLET | Freq: Every day | ORAL | Status: DC
  Administered 2013-05-27 – 2013-05-28 (×2): 81 mg via ORAL
  Filled 2013-05-27 (×2): qty 1

## 2013-05-27 MED ORDER — BRIMONIDINE TARTRATE 0.15 % OP SOLN: 1.0000 [drp] | Freq: Three times a day (TID) | OPHTHALMIC | Status: DC

## 2013-05-27 MED ORDER — SODIUM CHLORIDE 0.9 % IJ SOLN
3.0000 mL | Freq: Two times a day (BID) | INTRAMUSCULAR | Status: DC
  Administered 2013-05-28 (×2): 3 mL via INTRAVENOUS

## 2013-05-27 MED ORDER — MESALAMINE 1.2 G PO TBEC
1.2000 g | DELAYED_RELEASE_TABLET | Freq: Two times a day (BID) | ORAL | Status: DC
  Administered 2013-05-27 – 2013-05-28 (×4): 1.2 g via ORAL
  Filled 2013-05-27 (×6): qty 1

## 2013-05-27 MED ORDER — ONDANSETRON HCL 4 MG PO TABS: 4.0000 mg | ORAL_TABLET | Freq: Four times a day (QID) | ORAL | Status: DC | PRN

## 2013-05-27 MED ORDER — ACETAMINOPHEN 650 MG RE SUPP: 650.0000 mg | Freq: Four times a day (QID) | RECTAL | Status: DC | PRN

## 2013-05-27 MED ORDER — ENOXAPARIN SODIUM 30 MG/0.3ML ~~LOC~~ SOLN
30.0000 mg | SUBCUTANEOUS | Status: DC
  Administered 2013-05-27: 30 mg via SUBCUTANEOUS
  Filled 2013-05-27: qty 0.3

## 2013-05-27 MED ORDER — ENOXAPARIN SODIUM 40 MG/0.4ML ~~LOC~~ SOLN
40.0000 mg | SUBCUTANEOUS | Status: DC
  Administered 2013-05-28: 40 mg via SUBCUTANEOUS
  Filled 2013-05-27 (×2): qty 0.4

## 2013-05-27 MED ORDER — ACETAMINOPHEN 325 MG PO TABS: 650.0000 mg | ORAL_TABLET | Freq: Four times a day (QID) | ORAL | Status: DC | PRN

## 2013-05-27 MED ORDER — SODIUM CHLORIDE 0.9 % IV SOLN
INTRAVENOUS | Status: AC
  Administered 2013-05-27: 23:00:00 via INTRAVENOUS

## 2013-05-27 MED ORDER — BRIMONIDINE TARTRATE 0.2 % OP SOLN
1.0000 [drp] | Freq: Three times a day (TID) | OPHTHALMIC | Status: DC
  Administered 2013-05-27 – 2013-05-28 (×5): 1 [drp] via OPHTHALMIC
  Filled 2013-05-27: qty 5

## 2013-05-27 MED ORDER — ONDANSETRON HCL 4 MG/2ML IJ SOLN: 4.0000 mg | Freq: Four times a day (QID) | INTRAMUSCULAR | Status: DC | PRN

## 2013-05-27 NOTE — Progress Notes (Signed)
Patient seen and examined. Admitted after midnight secondary to unresponsiveness and hypotension. Patient is w/o fever, chills, abd pain, CP or coughing. Appears to be secondary to dehydration in the setting of chronic diarrhea and continue use of PTA antihypertensive regimen. For further info/details on admission referred to Dr. Toniann Fail H&P.  Plan: -will continue antibiotics empirically  -continue IVF's resuscitation -continue holding BP meds -monitor on telemetry -BMET in am to follow renal function.   Edward Harvey [phone]

## 2013-05-27 NOTE — H&P (Signed)
[facility] History and Physical  Edward Harvey ZHY:[id number] DOB: 06/08/1915 DOA: 05/26/2013  Referring physician: ER physician. PCP: Michele Mcalpine, MD   Chief Complaint: Unresponsiveness and low blood pressure.  HPI: Edward Harvey is a 79 y.o. male with history of complete heart block status post pacemaker placement, hypertension, Crohn's colitis, CAD, gout was found to be unresponsive at his house by his wife last night while patient was sitting on the chair. EMS was called and by then patient became responsive but was found to be hypotensive. As per the family patient was having diarrhea yesterday and had one episode in the ER. Patient denies any abdominal pain nausea vomiting fever chills chest pain shortness of breath. Patient was found to be hypotensive in the ER and has been scheduled as normal saline bolus following which blood pressure has improved. Due to leukocytosis and initial septic-like picture blood cultures urine cultures and stool studies were sent and patient was started on empiric antibiotics. Patient has been admitted for further workup.   Review of Systems: As presented in the history of presenting illness, rest negative.  Past Medical History  Diagnosis Date  . Glaucoma   . Acute bronchitis   . Hypertension   . CAD (coronary artery disease)     nuclear stress test 2006 inferoseptal and apical infarct ejection fraction 48%  . Atrioventricular block, complete   . Cardiac pacemaker in situ   . Diverticulosis of colon with hemorrhage   . Hemorrhage of gastrointestinal tract, unspecified   . Unspecified intestinal obstruction   . Inguinal hernia   . Gallstone   . DJD (degenerative joint disease)   . Gout   . Hematoma     subdural  . Prostate cancer   . Internal hemorrhoids without mention of complication    Past Surgical History  Procedure Laterality Date  . Pacemaker placement    . Cataract extraction, bilateral    . Tonsillectomy  age 75  .  Subtotal colectomy  12/03    Dr. Ezzard Standing  . Lipoma excision  2001    left leg   Social History:  reports that he has never smoked. He has never used smokeless tobacco. He reports that he does not drink alcohol or use illicit drugs. Where does patient live home. Can patient participate in ADLs? Yes.  No Known Allergies  Family History: History reviewed. No pertinent family history.    Prior to Admission medications   Medication Sig Start Date End Date Taking? Authorizing Provider  allopurinol (ZYLOPRIM) 300 MG tablet Take 300 mg by mouth daily.   Yes Historical Provider, MD  aspirin 81 MG chewable tablet Chew 81 mg by mouth daily.    Yes Historical Provider, MD  brimonidine (ALPHAGAN P) 0.1 % SOLN Place 1 drop into both eyes 3 (three) times daily.    Yes Historical Provider, MD  carvedilol (COREG) 6.25 MG tablet Take 6.25 mg by mouth 2 (two) times daily with a meal.   Yes Historical Provider, MD  Cholecalciferol (VITAMIN D) 1000 UNITS capsule Take 1,000 Units by mouth daily.     Yes Historical Provider, MD  diphenoxylate-atropine (LOMOTIL) 2.5-0.025 MG per tablet Take 1 tablet by mouth 4 (four) times daily as needed for diarrhea or loose stools.   Yes Historical Provider, MD  furosemide (LASIX) 20 MG tablet TAKE 1 TABLET BY MOUTH EVERY DAY 02/27/13  Yes Duke Salvia, MD  hydrocortisone (ANUSOL-HC) 2.5 % rectal cream Place 1 application rectally daily as needed for  hemorrhoids. 07/24/12  Yes Mardella Layman, MD  mesalamine (LIALDA) 1.2 G EC tablet Take 1.2 g by mouth 2 (two) times daily. 07/20/12  Yes Michele Mcalpine, MD  Multiple Vitamins-Minerals (CENTRUM SILVER PO) Take 1 tablet by mouth daily.     Yes Historical Provider, MD  quinapril (ACCUPRIL) 20 MG tablet Take 20 mg by mouth every evening. 03/25/12  Yes Duke Salvia, MD  tamsulosin (FLOMAX) 0.4 MG CAPS TAKE ONE CAPSULE BY MOUTH EVERY DAY AFTER SUPPER 12/29/12  Yes Michele Mcalpine, MD    Physical Exam: Filed Vitals:   05/27/13 0000  05/27/13 0027 05/27/13 0055 05/27/13 0100  BP: 103/59 106/55 121/59 109/65  Pulse: 64 66 67   Temp:   98.4 F (36.9 C)   TempSrc:   Oral   Resp: 20 20 24 14   Height:   6' 1.5" (1.867 m)   Weight:   94.6 kg (208 lb 8.9 oz)   SpO2: 99% 100% 97%      General:  Well-developed and nourished.  Eyes: Anicteric no pallor. Left eye is blind.  ENT: No discharge from ears eyes nose mouth.  Neck: No mass felt.  Cardiovascular: S1-S2 heard.  Respiratory: No rhonchi or crepitations.  Abdomen: Soft nontender bowel sounds present.  Skin: No rash.  Musculoskeletal: No edema.  Psychiatric: Patient is oriented to his name and has dementia.  Neurologic: Alert and oriented. Moves all extremities.  Labs on Admission:  Basic Metabolic Panel:  Recent Labs Lab 05/26/13 2128  NA 136  K 4.8  CL 103  CO2 22  GLUCOSE 101*  BUN 25*  CREATININE 1.73*  CALCIUM 8.6   Liver Function Tests:  Recent Labs Lab 05/26/13 2128  AST 27  ALT 16  ALKPHOS 94  BILITOT 0.5  PROT 7.6  ALBUMIN 3.1*    Recent Labs Lab 05/26/13 2128  LIPASE 85*   No results found for this basename: AMMONIA,  in the last 168 hours CBC:  Recent Labs Lab 05/26/13 2128  WBC 15.1*  NEUTROABS 11.9*  HGB 12.2*  HCT 35.4*  MCV 100.9*  PLT 160   Cardiac Enzymes:  Recent Labs Lab 05/26/13 2127  TROPONINI <0.30    BNP (last 3 results)  Recent Labs  05/26/13 2127  PROBNP 278.5   CBG: No results found for this basename: GLUCAP,  in the last 168 hours  Radiological Exams on Admission: Ct Head Wo Contrast  05/26/2013   CLINICAL DATA:  Syncope.  EXAM: CT HEAD WITHOUT CONTRAST  TECHNIQUE: Contiguous axial images were obtained from the base of the skull through the vertex without intravenous contrast.  COMPARISON:  None.  FINDINGS: The brain is atrophic. No evidence of acute abnormality including infarct, hemorrhage, mass lesion, mass effect, midline shift or abnormal extra-axial fluid collection.  Marked mucosal thickening right maxillary sinus is identified. The calvarium is intact with bilateral burr holes noted.  IMPRESSION: No acute abnormality.  Atrophy.  Right maxillary sinus disease.   Electronically Signed   By: Drusilla Kanner M.D.   On: 05/26/2013 21:57   Dg Chest Port 1 View  05/26/2013   CLINICAL DATA:  Loss of consciousness.  Weakness.  EXAM: PORTABLE CHEST - 1 VIEW  COMPARISON:  09/06/2012.  FINDINGS: Cardiomegaly is present with pulmonary vascular congestion and interstitial pulmonary edema. Low volumes with basilar atelectasis. Monitoring leads project over the chest. Unchanged dual lead right subclavian cardiac pacemaker. Right-greater-than-left glenohumeral osteoarthritis.  IMPRESSION: Constellation of findings compatible with mild CHF. Low  volume chest.   Electronically Signed   By: Andreas Newport M.D.   On: 05/26/2013 22:20    Assessment/Plan Principal Problem:   Hypotension Active Problems:   Diarrhea   Crohn's colitis   Syncope   1. Hypotension - suspect most likely cause could be dehydration. At this time we will hold off patient's antihypertensives and continue with hydration. Patient has been started on antibiotics for possible sepsis which will be continued until preliminary blood culture results are available. Since patient has diarrhea which could be causing the dehydration check stool for C. difficile and cultures. 2. Syncope probably secondary to hypotension - closely monitor. Patient is nonfocal at this time. 3. Acute on chronic renal failure - probably from dehydration and hypotension. Hold antihypertensives including Accupril and Lasix. Continue with hydration and closely follow intake output. 4. Diarrhea with history of Crohn's colitis - check stool studies and if does not improve may consult GI. Patient's abdomen appears benign on exam. 5. History of complete heart block status post pacemaker placement - monitor in telemetry for now. 6. History of CAD  - denies any chest pain. Check troponins. 7. Previous history of GI bleed from diverticula - monitor CBC.    Code Status: Full code.  Family Communication: Patient's family at the bedside.  Disposition Plan: Admit to inpatient.    Margueritte Guthridge N. [facility] Pager [phone].  If 7PM-7AM, please contact night-coverage [email or web address] Password [facility] 05/27/2013, 2:48 AM

## 2013-05-27 NOTE — Progress Notes (Signed)
Patient transported from ER via stretcher on tele by RN. Patient oriented to unit and room, instructed on callbell and placed at side. Family at bedside and updated on POC. Will continue to monitor.

## 2013-05-28 ENCOUNTER — Encounter (HOSPITAL_COMMUNITY): Payer: Self-pay | Admitting: General Practice

## 2013-05-28 DIAGNOSIS — I10 Essential (primary) hypertension: Secondary | ICD-10-CM

## 2013-05-28 DIAGNOSIS — I4891 Unspecified atrial fibrillation: Secondary | ICD-10-CM

## 2013-05-28 LAB — BASIC METABOLIC PANEL
CO2: 17 mEq/L — ABNORMAL LOW (ref 19–32)
Calcium: 7.9 mg/dL — ABNORMAL LOW (ref 8.4–10.5)
Chloride: 109 mEq/L (ref 96–112)
Glucose, Bld: 85 mg/dL (ref 70–99)
Potassium: 4.6 mEq/L (ref 3.5–5.1)
Sodium: 137 mEq/L (ref 135–145)

## 2013-05-28 MED ORDER — DIPHENOXYLATE-ATROPINE 2.5-0.025 MG/5ML PO LIQD: 5.0000 mL | Freq: Four times a day (QID) | ORAL | Status: DC | PRN

## 2013-05-28 MED ORDER — DIPHENOXYLATE-ATROPINE 2.5-0.025 MG PO TABS: 1.0000 | ORAL_TABLET | Freq: Four times a day (QID) | ORAL | Status: DC | PRN

## 2013-05-28 NOTE — Progress Notes (Signed)
[facility] PROGRESS NOTE  Edward Harvey RUE:[id number] DOB: February 19, 1916 DOA: 05/26/2013 PCP: Michele Mcalpine, MD  Assessment/Plan: 1-hypotension: due to dehydration with diarrhea and continue use antihypertensive regimen -BP soft but stable now after IVF's given -will switch IVF's to [facility] -continue encouraging PO intake -no signs or symptoms of infections; C. Diff neg and preliminary stool cx is also negative -will continue holding antihypertensive regimen for now  2-Crohn's colitis: do not appear to be secondary to flare at this point. -will continue mesalamine -PRN lomotil -supportive care  3-syncope: no arrythmia appreciated. Pacemaker working properly -will continue to monitor -appears to be secondary to hypotension  4-Gout: will continue allopurinol, no flares at this point  5-acute on chronic renal function: resolved with IVF's -Cr back to baseline -will monitor -continue holding lasix and ACE for today -BMET in am  6-glaucoma: continue alphagan  7-HTN: BP stable but still soft. Will continue holding antihypertensive drugs.  DVT: Lovenox  Code Status: Full Family Communication: no family at bedside Disposition Plan: will transfer to telemetry, involve PT   Consultants:  None   Procedures:  See below for x-ray reports  Antibiotics:  vanc and zosyn (empirically started for 36 hours; stopped due to lack of signs/symptoms for infectious process at this time)  HPI/Subjective: Feeling better, afebrile, no CP or SOB.  Objective: Filed Vitals:   05/28/13 0400  BP: 119/68  Pulse: 60  Temp: 98.4 F (36.9 C)  Resp: 28    Intake/Output Summary (Last 24 hours) at 05/28/13 0800 Last data filed at 05/28/13 0400  Gross per 24 hour  Intake   1115 ml  Output   1427 ml  Net   -312 ml   Filed Weights   05/27/13 0055  Weight: 94.6 kg (208 lb 8.9 oz)    Exam:   General:  Afebrile, NAD, feeling better  Cardiovascular: S1 and S2, no rubs or  gallops  Respiratory: good air movement, no crackles or wheezing  Abdomen: soft, NT, ND, positive BS  Musculoskeletal: trace edema bilaterally   Data Reviewed: Basic Metabolic Panel:  Recent Labs Lab 05/26/13 2128 05/27/13 0509 05/28/13 0415  NA 136 137 137  K 4.8 3.9 4.6  CL 103 108 109  CO2 22 20 17*  GLUCOSE 101* 97 85  BUN 25* 21 17  CREATININE 1.73* 1.36* 1.21  CALCIUM 8.6 7.9* 7.9*   Liver Function Tests:  Recent Labs Lab 05/26/13 2128 05/27/13 0509  AST 27 16  ALT 16 12  ALKPHOS 94 78  BILITOT 0.5 0.4  PROT 7.6 6.1  ALBUMIN 3.1* 2.4*    Recent Labs Lab 05/26/13 2128  LIPASE 85*   CBC:  Recent Labs Lab 05/26/13 2128 05/27/13 0509  WBC 15.1* 8.5  NEUTROABS 11.9* 5.7  HGB 12.2* 10.6*  HCT 35.4* 31.5*  MCV 100.9* 99.7  PLT 160 144*   Cardiac Enzymes:  Recent Labs Lab 05/26/13 2127 05/27/13 0509  TROPONINI <0.30 <0.30   BNP (last 3 results)  Recent Labs  05/26/13 2127  PROBNP 278.5    Recent Results (from the past 240 hour(s))  MRSA PCR SCREENING     Status: None   Collection Time    05/27/13  1:20 AM      Result Value Range Status   MRSA by PCR NEGATIVE  NEGATIVE Final   Comment:            The GeneXpert MRSA Assay (FDA     approved for NASAL specimens  only), is one component of a     comprehensive MRSA colonization     surveillance program. It is not     intended to diagnose MRSA     infection nor to guide or     monitor treatment for     MRSA infections.  CLOSTRIDIUM DIFFICILE BY PCR     Status: None   Collection Time    05/27/13  4:06 AM      Result Value Range Status   C difficile by pcr NEGATIVE  NEGATIVE Final  STOOL CULTURE     Status: None   Collection Time    05/27/13  4:06 AM      Result Value Range Status   Specimen Description STOOL   Final   Special Requests NONE   Final   Culture     Final   Value: Culture reincubated for better growth     Performed at [facility]   Report Status  PENDING   Incomplete     Studies: Ct Head Wo Contrast  05/26/2013   CLINICAL DATA:  Syncope.  EXAM: CT HEAD WITHOUT CONTRAST  TECHNIQUE: Contiguous axial images were obtained from the base of the skull through the vertex without intravenous contrast.  COMPARISON:  None.  FINDINGS: The brain is atrophic. No evidence of acute abnormality including infarct, hemorrhage, mass lesion, mass effect, midline shift or abnormal extra-axial fluid collection. Marked mucosal thickening right maxillary sinus is identified. The calvarium is intact with bilateral burr holes noted.  IMPRESSION: No acute abnormality.  Atrophy.  Right maxillary sinus disease.   Electronically Signed   By: Drusilla Kanner M.D.   On: 05/26/2013 21:57   Dg Chest Port 1 View  05/26/2013   CLINICAL DATA:  Loss of consciousness.  Weakness.  EXAM: PORTABLE CHEST - 1 VIEW  COMPARISON:  09/06/2012.  FINDINGS: Cardiomegaly is present with pulmonary vascular congestion and interstitial pulmonary edema. Low volumes with basilar atelectasis. Monitoring leads project over the chest. Unchanged dual lead right subclavian cardiac pacemaker. Right-greater-than-left glenohumeral osteoarthritis.  IMPRESSION: Constellation of findings compatible with mild CHF. Low volume chest.   Electronically Signed   By: Andreas Newport M.D.   On: 05/26/2013 22:20    Scheduled Meds: . allopurinol  300 mg Oral Daily  . aspirin  81 mg Oral Daily  . brimonidine  1 drop Both Eyes TID  . enoxaparin (LOVENOX) injection  40 mg Subcutaneous Q24H  . mesalamine  1.2 g Oral BID  . sodium chloride  3 mL Intravenous Q12H   Continuous Infusions:    Time spent: >30 minutes    [name]  [facility] Pager [phone]. If 7PM-7AM, please contact night-coverage at [email or web address], password [facility] 05/28/2013, 8:00 AM  LOS: 2 days

## 2013-05-28 NOTE — Progress Notes (Signed)
Utilization review completed.  

## 2013-05-28 NOTE — Care Management Note (Signed)
    Page 1 of 1   05/28/2013     10:46:42 AM   CARE MANAGEMENT NOTE 05/28/2013  Patient:  Edward Harvey, Edward Harvey   Account Number:  [id number]  Date Initiated:  05/28/2013  Documentation initiated by:  Donn Pierini  Subjective/Objective Assessment:   Pt admitted with syncope and hypotension     Action/Plan:   PTA pt lived at home with family- PT eval ordered and pending- NCM to follow for recommendations   Anticipated DC Date:  05/30/2013   Anticipated DC Plan:        DC Planning Services  CM consult      Choice offered to / List presented to:             Status of service:  In process, will continue to follow Medicare Important Message given?   (If response is "NO", the following Medicare IM given date fields will be blank) Date Medicare IM given:   Date Additional Medicare IM given:    Discharge Disposition:    Per UR Regulation:  Reviewed for med. necessity/level of care/duration of stay  If discussed at Long Length of Stay Meetings, dates discussed:    Comments:

## 2013-05-28 NOTE — Evaluation (Signed)
Physical Therapy Evaluation Patient Details Name: Edward Harvey MRN: [id number] DOB: 02-08-1916 Today's Date: 05/28/2013 Time: 6962-9528 PT Time Calculation (min): 43 min  PT Assessment / Plan / Recommendation History of Present Illness  pt presents with diarrhea, dehydration, and hypotension.    Clinical Impression  Pt with very loose BM in bed and then again when trying to get up from bed and while pivoting on to 3-in-1.  Nsg aware of very loose bowels.  Ambulation limited by pt continuously having BM while standing.  Per son family is wishing for info about an Aide to A with bathing/dressing pt at home and specifically requests a male Aide.  Will continue to follow.      PT Assessment  Patient needs continued PT services    Follow Up Recommendations  Home health PT;Supervision/Assistance - 24 hour    Does the patient have the potential to tolerate intense rehabilitation      Barriers to Discharge        Equipment Recommendations  Wheelchair (measurements PT);Wheelchair cushion (measurements PT)    Recommendations for Other Services     Frequency Min 3X/week    Precautions / Restrictions Precautions Precautions: Fall Precaution Comments: pt with very loose uncontrolable diarrhea.   Restrictions Weight Bearing Restrictions: No   Pertinent Vitals/Pain Denied pain.        Mobility  Bed Mobility Bed Mobility: Rolling Left;Left Sidelying to Sit;Sitting - Scoot to Edge of Bed Rolling Left: 4: Min assist;With rail Left Sidelying to Sit: 4: Min assist;With rails Sitting - Scoot to Edge of Bed: 3: Mod assist Details for Bed Mobility Assistance: cues for use of UEs.   Transfers Transfers: Sit to Stand;Stand to Sit;Stand Pivot Transfers Sit to Stand: 3: Mod assist;With upper extremity assist;From bed;From chair/3-in-1 Stand to Sit: 3: Mod assist;With upper extremity assist;To chair/3-in-1 Stand Pivot Transfers: 3: Mod assist Details for Transfer Assistance: cues and A for  use of UEs, controlling descent to sitting, movement of RW during SPT.  pt incontinent of bowel upon standing and needed to pivot to 3-in-1.   Ambulation/Gait Ambulation/Gait Assistance: Not tested (comment) Stairs: No Wheelchair Mobility Wheelchair Mobility: No    Exercises     PT Diagnosis: Difficulty walking;Generalized weakness  PT Problem List: Decreased strength;Decreased activity tolerance;Decreased balance;Decreased mobility;Decreased knowledge of use of DME PT Treatment Interventions: DME instruction;Gait training;Stair training;Functional mobility training;Therapeutic activities;Therapeutic exercise;Balance training;Patient/family education     PT Goals(Current goals can be found in the care plan section) Acute Rehab PT Goals Patient Stated Goal: "Belly to get better." PT Goal Formulation: With patient Time For Goal Achievement: 06/11/13 Potential to Achieve Goals: Fair  Visit Information  Last PT Received On: 05/28/13 Assistance Needed: +2 (2/2 pt uncontrolled diarrhea.  ) History of Present Illness: pt presents with diarrhea, dehydration, and hypotension.         Prior Functioning  Home Living Family/patient expects to be discharged to:: Private residence Living Arrangements: Children Available Help at Discharge: Family;Available 24 hours/day Type of Home: House Home Access: Stairs to enter [employer] of Steps: 2 Entrance Stairs-Rails: Right Home Layout: One level Home Equipment: Walker - 2 wheels;Bedside commode;Shower seat;Toilet riser Prior Function Level of Independence: Needs assistance Gait / Transfers Assistance Needed: Uses RW.   ADL's / Homemaking Assistance Needed: Daughter performs all homemaking and has been having to start assisting pt with some of his ADLs lately.   Communication / Swallowing Assistance Needed: Very HOH.   Communication Communication: HOH    Cognition  Cognition Arousal/Alertness: Awake/alert Behavior During  Therapy: WFL for tasks assessed/performed Overall Cognitive Status: Within Functional Limits for tasks assessed    Extremity/Trunk Assessment Upper Extremity Assessment Upper Extremity Assessment: Overall WFL for tasks assessed Lower Extremity Assessment Lower Extremity Assessment: Generalized weakness   Balance Balance Balance Assessed: No  End of Session PT - End of Session Equipment Utilized During Treatment: Gait belt Activity Tolerance:  (Limited by very loose bowels) Patient left: in chair;with call bell/phone within reach;with family/visitor present Nurse Communication: Mobility status (Very loose BM)  GP     RitenourAlison Murray, PT [phone] 05/28/2013, 11:25 AM

## 2013-05-28 NOTE — Progress Notes (Signed)
Patient being transferred to 2 west per MD order. Report called to [place], Charity fundraiser. Patient will transfer in wheelchair. Family at bedside.

## 2013-05-29 DIAGNOSIS — I442 Atrioventricular block, complete: Secondary | ICD-10-CM

## 2013-05-29 DIAGNOSIS — R269 Unspecified abnormalities of gait and mobility: Secondary | ICD-10-CM

## 2013-05-29 DIAGNOSIS — N289 Disorder of kidney and ureter, unspecified: Secondary | ICD-10-CM

## 2013-05-29 LAB — CBC
HCT: 32.8 % — ABNORMAL LOW (ref 39.0–52.0)
Hemoglobin: 11 g/dL — ABNORMAL LOW (ref 13.0–17.0)
MCH: 33 pg (ref 26.0–34.0)
MCHC: 33.5 g/dL (ref 30.0–36.0)
MCV: 98.5 fL (ref 78.0–100.0)
Platelets: 144 10*3/uL — ABNORMAL LOW (ref 150–400)
RBC: 3.33 MIL/uL — ABNORMAL LOW (ref 4.22–5.81)

## 2013-05-29 LAB — BASIC METABOLIC PANEL
BUN: 14 mg/dL (ref 6–23)
CO2: 22 mEq/L (ref 19–32)
Calcium: 8.1 mg/dL — ABNORMAL LOW (ref 8.4–10.5)
Chloride: 108 mEq/L (ref 96–112)
Creatinine, Ser: 0.96 mg/dL (ref 0.50–1.35)
GFR calc non Af Amer: 68 mL/min — ABNORMAL LOW (ref >90)
Glucose, Bld: 81 mg/dL (ref 70–99)

## 2013-05-29 MED ORDER — QUINAPRIL HCL 20 MG PO TABS: 10.0000 mg | ORAL_TABLET | Freq: Every evening | ORAL | Status: DC

## 2013-05-29 MED ORDER — CARVEDILOL 3.125 MG PO TABS: 6.2500 mg | ORAL_TABLET | Freq: Two times a day (BID) | ORAL | Status: DC

## 2013-05-29 MED ORDER — FUROSEMIDE 20 MG PO TABS: 20.0000 mg | ORAL_TABLET | ORAL | Status: DC

## 2013-05-29 NOTE — Progress Notes (Signed)
   CARE MANAGEMENT NOTE 05/29/2013  Patient:  GIL, INGWERSEN   Account Number:  [id number]  Date Initiated:  05/28/2013  Documentation initiated by:  Donn Pierini  Subjective/Objective Assessment:   Pt admitted with syncope and hypotension     Action/Plan:   PTA pt lived at home with family- PT eval ordered and pending- NCM to follow for recommendations   Anticipated DC Date:  05/30/2013   Anticipated DC Plan:        DC Planning Services  CM consult      [facility]  HOME HEALTH   Choice offered to / List presented to:  C-4 Adult Children        HH arranged  HH-1 RN  HH-2 PT  HH-4 NURSE'S AIDE      HH agency  [facility]   Status of service:  Completed, signed off Medicare Important Message given?   (If response is "NO", the following Medicare IM given date fields will be blank) Date Medicare IM given:   Date Additional Medicare IM given:    Discharge Disposition:  HOME W HOME HEALTH SERVICES  Per UR Regulation:  Reviewed for med. necessity/level of care/duration of stay  If discussed at Long Length of Stay Meetings, dates discussed:    Comments:  05/29/13 12:30 CM met with pt and pt's son, Wadie, Liew., in room to offer choice.  Wilfrid Lund., chose [facility] to deliver HHPT, RN, aide for his father.  Address was verified and contact should be made through the son of pt, Alexandar Weisenberger, Montez Hageman. at [phone] or [phone]. Referral faxed to [facility].  no other CM needs were communicated.  Freddy Jaksch, BSN, CM [phone].

## 2013-05-29 NOTE — Progress Notes (Signed)
Pt discharged per MD order and protocol. Discharge instructions reviewed with patient and all questions answered. Pt aware of all follow up appointments.  

## 2013-05-29 NOTE — Discharge Summary (Signed)
Physician Discharge Summary  Edward Harvey ZOX:[id number] DOB: 03/15/1916 DOA: 05/26/2013  PCP: Michele Mcalpine, MD  Admit date: 05/26/2013 Discharge date: 05/29/2013  Time spent: > 30 minutes  Recommendations for Outpatient Follow-up:  1. BMET to follow electrolytes and renal function 2. Reassess BP and adjust medications as needed 3. Arrange outpatient follow up with GI for further evaluation and tx of his crohn's disease.  Discharge Diagnoses:  Principal Problem:   Hypotension Active Problems:   Diarrhea   Crohn's colitis   Syncope OA Physical deconditioning Acute on chronic renal failure Dehydration  Glaucoma   Discharge Condition: stable and improved. Will discharge home with [facility] services and wheelchair to help/facilitate mobilization.  Diet recommendation: low sodium diet and increase fiber intake  Filed Weights   05/27/13 0055  Weight: 94.6 kg (208 lb 8.9 oz)    History of present illness:  79 y.o. male with history of complete heart block status post pacemaker placement, hypertension, Crohn's colitis, CAD, gout was found to be unresponsive at his house by his wife last night while patient was sitting on the chair. EMS was called and by then patient became responsive but was found to be hypotensive. As per the family patient was having diarrhea yesterday and had one episode in the ER. Patient denies any abdominal pain nausea vomiting fever chills chest pain shortness of breath. Patient was found to be hypotensive in the ER and has been scheduled as normal saline bolus following which blood pressure has improved. Due to leukocytosis and initial septic-like picture blood cultures urine cultures and stool studies were sent and patient was started on empiric antibiotics. Patient has been admitted for further workup.    Hospital Course:  1-hypotension: due to dehydration with diarrhea and continue use of antihypertensive regimen  -BP stable after IVF's resuscitation -patient  encourage to increase PO intake  -no signs or symptoms of infections; C. Diff neg and preliminary stool cx was also negative  -antihypertensive agent adjusted to half of original dose -patient advised to keep himself hydrated  2-Crohn's colitis: without flare at this point.  -will continue mesalamine  -continue PRN lomotil  -patient advise to increase fiber intake in his diet -outpatient follow up with GI  3-syncope: no arrythmia appreciated. Pacemaker working properly  -appears to be secondary to hypotension and orthostatic changes -BP meds adjusted and changed to half of original dose -at discharge BP stable and no further episodes of syncope/near syncope   4-Gout: will continue allopurinol, no flares at this point   5-acute on chronic renal function: resolved with IVF's  -Cr back to baseline  -lasix and ACE resume at discharge  -BMET during follow up with PCP  6-glaucoma: continue alphagan   7-HTN: BP stable at discharge. -as mentioned above will adjust BP meds and patient advise to follow low sodium diet  8-OA and physical deconditioning: per PT rec's will arrange HHPT, HHRN, HH Aid and will provide wheelchair with cushion.   Procedures:  See below for x-ray reports  Consultations:  none  Discharge Exam: Filed Vitals:   05/29/13 0422  BP: 131/72  Pulse: 59  Temp: 98.1 F (36.7 C)  Resp: 18   General: Afebrile, NAD, feeling better  Cardiovascular: S1 and S2, no rubs or gallops  Respiratory: good air movement, no crackles or wheezing  Abdomen: soft, NT, ND, positive BS  Musculoskeletal: trace edema bilaterally    Discharge Instructions  Discharge Orders   Future Appointments Provider Department Dept Phone   06/18/2013 8:45  AM Marlowe Aschoff, DPM [facility] [phone]   07/20/2013 10:30 AM Lorin Picket Elayne Snare, MD [facility] [phone]   Future Orders Complete By Expires   Diet - low sodium heart healthy  As directed     Discharge instructions  As directed    Comments:     Take medications as prescribed Keep yourself well hydrated Follow a low sodium diet Increase the amount of fiber ingestion in your diet Arrange follow up with PCP in 10 days   For home use only DME standard manual wheelchair with seat cushion  As directed    Comments:     Patient suffers from OA and is severely physically deconditioned which impairs their ability to perform daily activities and ambulation at home.  A walker or cane will not resolve issue with performing activities of daily living. A wheelchair will allow patient to safely perform daily activities. Patient can safely propel the wheelchair in the home or has a caregiver who can provide assistance.  Accessories: elevating leg rests (ELRs), wheel locks, extensions and anti-tippers.   For home use only DME wheelchair cushion (seat and back)  As directed        Medication List         allopurinol 300 MG tablet  Commonly known as:  ZYLOPRIM  Take 300 mg by mouth daily.     aspirin 81 MG chewable tablet  Chew 81 mg by mouth daily.     brimonidine 0.1 % Soln  Commonly known as:  ALPHAGAN P  Place 1 drop into both eyes 3 (three) times daily.     carvedilol 3.125 MG tablet  Commonly known as:  COREG  Take 2 tablets (6.25 mg total) by mouth 2 (two) times daily with a meal.     CENTRUM SILVER PO  Take 1 tablet by mouth daily.     diphenoxylate-atropine 2.5-0.025 MG per tablet  Commonly known as:  LOMOTIL  Take 1 tablet by mouth 4 (four) times daily as needed for diarrhea or loose stools.     furosemide 20 MG tablet  Commonly known as:  LASIX  Take 1 tablet (20 mg total) by mouth every other day.     hydrocortisone 2.5 % rectal cream  Commonly known as:  ANUSOL-HC  Place 1 application rectally daily as needed for hemorrhoids.     mesalamine 1.2 G EC tablet  Commonly known as:  LIALDA  Take 1.2 g by mouth 2 (two) times daily.     quinapril 20 MG tablet   Commonly known as:  ACCUPRIL  Take 0.5 tablets (10 mg total) by mouth every evening.     tamsulosin 0.4 MG Caps capsule  Commonly known as:  FLOMAX  TAKE ONE CAPSULE BY MOUTH EVERY DAY AFTER SUPPER     Vitamin D 1000 UNITS capsule  Take 1,000 Units by mouth daily.       No Known Allergies     Follow-up Information   Follow up with NADEL,SCOTT M, MD. Schedule an appointment as soon as possible for a visit in 10 days.   Specialty:  Pulmonary Disease   Contact information:   [address] [place] [address] [phone]       The results of significant diagnostics from this hospitalization (including imaging, microbiology, ancillary and laboratory) are listed below for reference.    Significant Diagnostic Studies: Ct Head Wo Contrast  05/26/2013   CLINICAL DATA:  Syncope.  EXAM: CT HEAD WITHOUT  CONTRAST  TECHNIQUE: Contiguous axial images were obtained from the base of the skull through the vertex without intravenous contrast.  COMPARISON:  None.  FINDINGS: The brain is atrophic. No evidence of acute abnormality including infarct, hemorrhage, mass lesion, mass effect, midline shift or abnormal extra-axial fluid collection. Marked mucosal thickening right maxillary sinus is identified. The calvarium is intact with bilateral burr holes noted.  IMPRESSION: No acute abnormality.  Atrophy.  Right maxillary sinus disease.   Electronically Signed   By: Drusilla Kanner M.D.   On: 05/26/2013 21:57   Dg Chest Port 1 View  05/26/2013   CLINICAL DATA:  Loss of consciousness.  Weakness.  EXAM: PORTABLE CHEST - 1 VIEW  COMPARISON:  09/06/2012.  FINDINGS: Cardiomegaly is present with pulmonary vascular congestion and interstitial pulmonary edema. Low volumes with basilar atelectasis. Monitoring leads project over the chest. Unchanged dual lead right subclavian cardiac pacemaker. Right-greater-than-left glenohumeral osteoarthritis.  IMPRESSION: Constellation of findings compatible with mild CHF.  Low volume chest.   Electronically Signed   By: Andreas Newport M.D.   On: 05/26/2013 22:20    Microbiology: Recent Results (from the past 240 hour(s))  CULTURE, BLOOD (ROUTINE X 2)     Status: None   Collection Time    05/26/13  9:15 PM      Result Value Range Status   Specimen Description BLOOD RIGHT ARM   Final   Special Requests BOTTLES DRAWN AEROBIC ONLY 10CC   Final   Culture  Setup Time     Final   Value: 05/27/2013 03:46     Performed at [facility]   Culture     Final   Value:        BLOOD CULTURE RECEIVED NO GROWTH TO DATE CULTURE WILL BE HELD FOR 5 DAYS BEFORE ISSUING A FINAL NEGATIVE REPORT     Performed at [facility]   Report Status PENDING   Incomplete  CULTURE, BLOOD (ROUTINE X 2)     Status: None   Collection Time    05/26/13  9:35 PM      Result Value Range Status   Specimen Description BLOOD RIGHT HAND   Final   Special Requests BOTTLES DRAWN AEROBIC ONLY 2CC   Final   Culture  Setup Time     Final   Value: 05/27/2013 03:46     Performed at [facility]   Culture     Final   Value:        BLOOD CULTURE RECEIVED NO GROWTH TO DATE CULTURE WILL BE HELD FOR 5 DAYS BEFORE ISSUING A FINAL NEGATIVE REPORT     Performed at [facility]   Report Status PENDING   Incomplete  MRSA PCR SCREENING     Status: None   Collection Time    05/27/13  1:20 AM      Result Value Range Status   MRSA by PCR NEGATIVE  NEGATIVE Final   Comment:            The GeneXpert MRSA Assay (FDA     approved for NASAL specimens     only), is one component of a     comprehensive MRSA colonization     surveillance program. It is not     intended to diagnose MRSA     infection nor to guide or     monitor treatment for     MRSA infections.  CLOSTRIDIUM DIFFICILE BY PCR     Status: None  Collection Time    05/27/13  4:06 AM      Result Value Range Status   C difficile by pcr NEGATIVE  NEGATIVE Final  STOOL CULTURE     Status: None   Collection Time     05/27/13  4:06 AM      Result Value Range Status   Specimen Description STOOL   Final   Special Requests NONE   Final   Culture     Final   Value: Culture reincubated for better growth     Performed at [facility]   Report Status PENDING   Incomplete     Labs: Basic Metabolic Panel:  Recent Labs Lab 05/26/13 2128 05/27/13 0509 05/28/13 0415 05/29/13 0625  NA 136 137 137 135  K 4.8 3.9 4.6 4.2  CL 103 108 109 108  CO2 22 20 17* 22  GLUCOSE 101* 97 85 81  BUN 25* 21 17 14   CREATININE 1.73* 1.36* 1.21 0.96  CALCIUM 8.6 7.9* 7.9* 8.1*   Liver Function Tests:  Recent Labs Lab 05/26/13 2128 05/27/13 0509  AST 27 16  ALT 16 12  ALKPHOS 94 78  BILITOT 0.5 0.4  PROT 7.6 6.1  ALBUMIN 3.1* 2.4*    Recent Labs Lab 05/26/13 2128  LIPASE 85*   CBC:  Recent Labs Lab 05/26/13 2128 05/27/13 0509 05/29/13 0625  WBC 15.1* 8.5 6.5  NEUTROABS 11.9* 5.7  --   HGB 12.2* 10.6* 11.0*  HCT 35.4* 31.5* 32.8*  MCV 100.9* 99.7 98.5  PLT 160 144* 144*   Cardiac Enzymes:  Recent Labs Lab 05/26/13 2127 05/27/13 0509  TROPONINI <0.30 <0.30   BNP: BNP (last 3 results)  Recent Labs  05/26/13 2127  PROBNP 278.5    Signed:  Paschal Blanton  [facility] 05/29/2013, 10:43 AM

## 2013-05-30 LAB — STOOL CULTURE

## 2013-06-01 DIAGNOSIS — Z45018 Encounter for adjustment and management of other part of cardiac pacemaker: Secondary | ICD-10-CM | POA: Diagnosis not present

## 2013-06-01 DIAGNOSIS — I442 Atrioventricular block, complete: Secondary | ICD-10-CM | POA: Diagnosis not present

## 2013-06-01 DIAGNOSIS — K5090 Crohn's disease, unspecified, without complications: Secondary | ICD-10-CM | POA: Diagnosis not present

## 2013-06-01 DIAGNOSIS — H409 Unspecified glaucoma: Secondary | ICD-10-CM | POA: Diagnosis not present

## 2013-06-01 DIAGNOSIS — I10 Essential (primary) hypertension: Secondary | ICD-10-CM | POA: Diagnosis not present

## 2013-06-01 DIAGNOSIS — I2510 Atherosclerotic heart disease of native coronary artery without angina pectoris: Secondary | ICD-10-CM | POA: Diagnosis not present

## 2013-06-02 ENCOUNTER — Ambulatory Visit (INDEPENDENT_AMBULATORY_CARE_PROVIDER_SITE_OTHER): Payer: Medicare Other | Admitting: Adult Health

## 2013-06-02 ENCOUNTER — Encounter: Payer: Self-pay | Admitting: Adult Health

## 2013-06-02 ENCOUNTER — Other Ambulatory Visit (INDEPENDENT_AMBULATORY_CARE_PROVIDER_SITE_OTHER): Payer: Medicare Other

## 2013-06-02 VITALS — BP 120/70 | HR 87 | Temp 98.4°F | Ht 72.0 in | Wt 207.4 lb

## 2013-06-02 DIAGNOSIS — I10 Essential (primary) hypertension: Secondary | ICD-10-CM

## 2013-06-02 DIAGNOSIS — I959 Hypotension, unspecified: Secondary | ICD-10-CM

## 2013-06-02 DIAGNOSIS — R197 Diarrhea, unspecified: Secondary | ICD-10-CM | POA: Diagnosis not present

## 2013-06-02 LAB — BASIC METABOLIC PANEL
Calcium: 9 mg/dL (ref 8.4–10.5)
GFR: 65.25 mL/min (ref >60.00)
Glucose, Bld: 78 mg/dL (ref 70–99)
Potassium: 4.4 mEq/L (ref 3.5–5.1)
Sodium: 140 mEq/L (ref 135–145)

## 2013-06-02 LAB — CULTURE, BLOOD (ROUTINE X 2): Culture: NO GROWTH

## 2013-06-02 NOTE — Progress Notes (Signed)
Subjective:    Patient ID: Edward Harvey, male    DOB: 02-10-1916, 79 y.o.   MRN: [id number]  HPI 79 y/o BM ... he has multiple medical problems including HBP, CAD, & complete heart block w/ pacer followed by DrWall & DrKlein;  hx of GI bleed from divertics w/ subtotal colectomy in 2003 & followed by DrPatterson;  hx SBO, inguinal hernias, gallstones;  hx prostate cancer followed by DrOttelin;  DJD & Gout;  hx subdural hematoma prev requiring burr hole, & mild senile dementia...  06/02/2013 Post Hospital follow up  Admitted 12/24-27 for found to be unresponsive at his house with  hypotensive. Had diarrhea PTA , Felt due to dehydration for GI illness with diarrhea. C. Diff neg  Antihypertensive agent adjusted to half of original dose  Given IVF resusciatation .  Felt syncopal episode , due to dehydration , No arrythmia appreciated. Pacemaker working properly  Discharged with PT , home health .   Since discharge he is feeling much better. Diarrhea has stopped. Eating well . Drinking fluids better. Family is helping to remind him. Family stopped him from drinking sodas.  No chest pain , orthopnea, abd pain , n/v/d. Or fever            Problem List:    GLAUCOMA (ICD-365.9) - prev followed by Deland Pretty w/ bilat cataract surg & glaucoma on eye 3 diff drops as noted...  HEARING LOSS - son says he has 3 sets of hearing aides but won't wear any of them...  Hx of ASTHMATIC BRONCHITIS, ACUTE (ICD-466.0) - he has never smoked... hx recurrent bronchitic infections over the yrs w/ reactive airway component... no recent prob, and no regular meds required.  ~  CXR 5/13 showed stable hrt size & pacer, clear lungs, degen changes in Tspine, NAD.Marland Kitchen. ~  CXR 4/14 showed heart at upper lim normal, totuous Ao, pacer, clear lungs/ stable scarring at left base/ NAD...  HYPERTENSION (ICD-401.9) - on ASA 81mg /d, COREG 6.25Bid, QUINAPRIL 20mg Bid, LASIX 20mg /d... He takes his own meds & states no problems. ~  4/12:   BP= 110/74 & feeling well... he denies HA, visual changes, CP, palipit, dizziness, syncope, change in dyspnea, etc... ~  8/12:  BP= 110/68 & continues stable... ~  12/12:  BP= 116/78 & he remains asymptomatic... ~  5/13:  BP= 126/80 & he continues to deny CP, palpit, SOB, edema, etc... ~  9/13:  BP= 126/78 & he denies CP, palpit, ch in SOB or edema... ~  10/13:  BP= 102/66 & he remains largely asymptomatic... ~  1/14:  BP= 118/70 & he continues to deny CP, palpit, ch in SOB/ edema... ~  4/14: on ASA81, Coreg6.25Bid, Quinapril20, Lasix20; wt is down 1# on diet to 224#; BP= 140/78 & he feels he is stable. ~  7/14: on ASA81, Coreg6.25Bid, Quinapril20, Lasix20; weight is down 5# to 219# & BP= 116/72  CORONARY ARTERY DISEASE (ICD-414.00) - followed by DrWall for Cardiology on above meds... pt has not had a prev cath... ~  2DEcho 4/05 showed mild conc LVH and norm LVF- improved from 2002 when EF= 45%... ~  NuclearStressTest 10/06 showed prior inferoseptal & apical infarct w/ apical AK, w/o ischemia, EF= 48%... no change from prev.  Hx of AV BLOCK, COMPLETE (ICD-426.0), & CARDIAC PACEMAKER IN SITU (ICD-V45.01) - he had a pacemaker placed for complete heart block initially in 1994, & this was exchanged for a new dual chamber pacer: Valene Bors DDDR model [phone] by [facility] 6/06... ~  seen by DrKlein 1/10 w/ pacer check OK, no further episodes of AFib on his monitor... not a Coumadin candidate due to age, unsteady, hx of GI bleeds, and prev subdural hematoma. ~  followed regularly by DrKlein's pacer clinic (last note 10/12 reviewed)> stable, doing well, no changes made.  DIVERTICULOSIS OF COLON WITH HEMORRHAGE (ICD-562.12)  GASTROINTESTINAL HEMORRHAGE (ICD-578.9) - he had a subtotal colectomy w/ ileum to distal sigmoid anastomosis in Jul03 by DrNewman... ~  EGD 8/03 by Dorris Singh was normal... ~  colonoscopy 1/08 by DrPatterson showed inflammed mucosa in sm bowel prox to the ileo-colonic anastomosis, few  divertics in remaining distal colon, no polyps etc...  ~  f/u colonoscopy 5/09 showed prev colectomy- anastomosis granular & bleeding ?Crohn's... also had divertics & hems... Rx'd w/ Lialda.  CROHN'S COLITIS >>  Hx of SMALL BOWEL OBSTRUCTION (ICD-560.9) > see [id number] Hospitalization by [facility]... ~  Abd Sonar 4/11 showed mult gallstones in GB, echogenic renal parenchyma c/w medical renal dis... ~  CT Abd 4/11 showed mild atx at bases, sl GB Benett thickening & ductal dilatation (known gallstones), mult cysts in spleen & kidneys, ectatic ao, bilat fat filled inguinal hernias, degen changes in spine... ~  12/13: he had f/u DrPatterson & note reviewed> hx segmental colitis assoc w/ severe diverticulosis & prev left colon resection; restarted LIALDA 1.2gm Bid for diarrhea & improved...  INGUINAL HERNIA (ICD-550.90) - he has bilat fat containing inguinal hernias seen on CT Abd in 2008...   GALLSTONES (ICD-574.20) - Sonar 4/11 showed mult gallstones filling the gallbladder, and CT Abd 4/11 showed mild extrahep biliary ductal dilatation... he was evaluated by DrPatterson/ GI, DrNewman/ Surg, DrWall/ Cards> all agreed that risk was hi & to hold off on surg unless absolutely necessary... ~  4/12 & 8/12:  He remains asymptomatic w/o abd pain, N/V, etc...  RENAL INSUFFICIENCY >> Creat in the 1.4 - 1.5 range... CARCINOMA, PROSTATE, HX OF (ICD-V10.46) - diagnosed in 1993 & treated w/ XRT... PSA initially ~29 and dropped to ~1 after XRT & it has remained there ever since... followed by DrOttelin- also has BPH, min obstructive symptoms, bilat hydroceles & some benign renal cysts... ~  labs 3/07 by Urology showed PSA= 1.19 ~  labs here 1/10 showed PSA= 1.37 ~  5/11: he reports f/u DrOttelin w/ incontinence symptoms Rx'd [place] XR 60mg /d & improved, so he stopped this on his own & states that he is doing satis now... ~  He continues to f/u w/ DrOttelin regularly- we do not have recent notes from Urology... ~  9/13:   He notes slower urinary stream & we decided to try [facility] 0.4mg Qhs... ~  Labs 1/14 showed BUN= 23, creat= 1.4 ~  Labs 7/14 showed BUN= 21, Creat= 1.3  DEGENERATIVE JOINT DISEASE (ICD-715.90) >> Hx of GOUT (ICD-274.9) >> he has hx of DJD and Gout treated w/ TRAMADOL Prn & ALLOPURINOL 300mg /d; also takes MVI & VIT D 1000 u daily... ~  Labs 5/13 showed Uric= 5.8  HEMATOMA, SUBDURAL (ICD-432.1) - he had a burr hole placed for subdural hematoma in the past...    Past Surgical History  Procedure Laterality Date  . Pacemaker placement    . Cataract extraction, bilateral    . Subtotal colectomy  12/03    Dr. Ezzard Standing  . Lipoma excision  2001    left leg  . Burr hole for subdural hematoma      "he's had 4 ORs"/notes 10/29/2001 (05/28/2013)  . Insert / replace / remove pacemaker  1994; 11/2004    Hattie Perch 10/29/2001; generator change/notes 11/23/2004  (05/28/2013)  . Pericardiocentesis  1994    post pacemaker placement/notes 03/12/2005 (05/28/2013)  . Tonsillectomy and adenoidectomy  age 9    03/12/2005 (05/28/2013)    Outpatient Encounter Prescriptions as of 06/02/2013  Medication Sig  . allopurinol (ZYLOPRIM) 300 MG tablet Take 300 mg by mouth daily.  Marland Kitchen aspirin 81 MG chewable tablet Chew 81 mg by mouth daily.   . brimonidine (ALPHAGAN P) 0.1 % SOLN Place 1 drop into both eyes 3 (three) times daily.   . carvedilol (COREG) 3.125 MG tablet Take 2 tablets (6.25 mg total) by mouth 2 (two) times daily with a meal.  . Cholecalciferol (VITAMIN D) 1000 UNITS capsule Take 1,000 Units by mouth daily.    . diphenoxylate-atropine (LOMOTIL) 2.5-0.025 MG per tablet Take 1 tablet by mouth 4 (four) times daily as needed for diarrhea or loose stools.  . furosemide (LASIX) 20 MG tablet Take 1 tablet (20 mg total) by mouth every other day.  . hydrocortisone (ANUSOL-HC) 2.5 % rectal cream Place 1 application rectally daily as needed for hemorrhoids.  . mesalamine (LIALDA) 1.2 G EC tablet Take 1.2 g by mouth 2  (two) times daily.  . Multiple Vitamins-Minerals (CENTRUM SILVER PO) Take 1 tablet by mouth daily.    . quinapril (ACCUPRIL) 20 MG tablet Take 0.5 tablets (10 mg total) by mouth every evening.  . tamsulosin (FLOMAX) 0.4 MG CAPS TAKE ONE CAPSULE BY MOUTH EVERY DAY AFTER SUPPER    No Known Allergies   Current Medications, Allergies, Past Medical History, Past Surgical History, Family History, and Social History were reviewed in [employer] record.    Review of Systems        See HPI - all other systems neg except as noted... The patient complains of dyspnea on exertion, peripheral edema, muscle weakness, and difficulty walking.  The patient denies anorexia, fever, weight loss, weight gain, hoarseness, chest pain, syncope, prolonged cough, headaches, hemoptysis, abdominal pain, melena, hematochezia, severe indigestion/heartburn, hematuria, incontinence, suspicious skin lesions, transient blindness, depression, unusual weight change, abnormal bleeding, enlarged lymph nodes, and angioedema.     Objective:   Physical Exam     WD, overweight, 79 y/o BM in NAD...  GENERAL:  Alert, pleasant & cooperative;  VS- reviewed... HEENT:  [place]/AT  EACs-clear (HOH), TMs-wnl, NOSE-clear discharge , THROAT-clear & wnl. NECK:  Supple w/ decrROM; no JVD; normal carotid impulses w/o bruits; no thyromegaly or nodules palpated; no lymphadenopathy. CHEST:  Clear to P & A; without wheezes/ rales/ or rhonchi heard;  Pacer palp in right chest... HEART:  Regular Rhythm; without murmurs/ rubs/ or gallops detected... ABDOMEN:  Scar of prev surg, soft & nontender; normal bowel sounds; no organomegaly or masses palpated...no guarding   EXT:  Mod-severe arthritic changes; no varicose veins/ +venous insuffic/ tr edema. NEURO:  CN's intact; motor testing normal; sensory testing inconsistant; gait is abnormal & balance only fair... DERM:  No lesions noted; no rash etc...  RADIOLOGY DATA:  Reviewed in  the EPIC EMR & discussed w/ the patient...  LABORATORY DATA:  Reviewed in the EPIC EMR & discussed w/ the patient...   Assessment & Plan:

## 2013-06-02 NOTE — Assessment & Plan Note (Signed)
Resolved

## 2013-06-02 NOTE — Patient Instructions (Signed)
Advance diet as tolerated.  Push Fluids .  Continue on blood pressure meds at current doses.  I will call with labs  Follow up Dr. Kriste Basque  In 6 weeks and As needed   Please contact office for sooner follow up if symptoms do not improve or worsen or seek emergency care

## 2013-06-02 NOTE — Assessment & Plan Note (Addendum)
Recent hypotension with dehydration/diarrhea.  B/p is improved , will keep on upper limits of normal avoid low b/p  Controlled on rx  Check bmet

## 2013-06-03 DIAGNOSIS — I10 Essential (primary) hypertension: Secondary | ICD-10-CM | POA: Diagnosis not present

## 2013-06-03 DIAGNOSIS — Z45018 Encounter for adjustment and management of other part of cardiac pacemaker: Secondary | ICD-10-CM | POA: Diagnosis not present

## 2013-06-03 DIAGNOSIS — I2510 Atherosclerotic heart disease of native coronary artery without angina pectoris: Secondary | ICD-10-CM | POA: Diagnosis not present

## 2013-06-03 DIAGNOSIS — I442 Atrioventricular block, complete: Secondary | ICD-10-CM | POA: Diagnosis not present

## 2013-06-03 DIAGNOSIS — K5090 Crohn's disease, unspecified, without complications: Secondary | ICD-10-CM | POA: Diagnosis not present

## 2013-06-03 DIAGNOSIS — H409 Unspecified glaucoma: Secondary | ICD-10-CM | POA: Diagnosis not present

## 2013-06-04 ENCOUNTER — Telehealth: Payer: Self-pay | Admitting: Pulmonary Disease

## 2013-06-04 NOTE — Telephone Encounter (Signed)
Will forward to SN as an [place].   Will sign off of message.

## 2013-06-07 DIAGNOSIS — H409 Unspecified glaucoma: Secondary | ICD-10-CM | POA: Diagnosis not present

## 2013-06-07 DIAGNOSIS — H4720 Unspecified optic atrophy: Secondary | ICD-10-CM | POA: Diagnosis not present

## 2013-06-07 DIAGNOSIS — H353190 Nonexudative age-related macular degeneration, unspecified eye, stage unspecified: Secondary | ICD-10-CM | POA: Diagnosis not present

## 2013-06-07 DIAGNOSIS — H4011X0 Primary open-angle glaucoma, stage unspecified: Secondary | ICD-10-CM | POA: Diagnosis not present

## 2013-06-07 DIAGNOSIS — Z961 Presence of intraocular lens: Secondary | ICD-10-CM | POA: Diagnosis not present

## 2013-06-08 DIAGNOSIS — K5090 Crohn's disease, unspecified, without complications: Secondary | ICD-10-CM | POA: Diagnosis not present

## 2013-06-08 DIAGNOSIS — I442 Atrioventricular block, complete: Secondary | ICD-10-CM | POA: Diagnosis not present

## 2013-06-08 DIAGNOSIS — I10 Essential (primary) hypertension: Secondary | ICD-10-CM | POA: Diagnosis not present

## 2013-06-08 DIAGNOSIS — I2510 Atherosclerotic heart disease of native coronary artery without angina pectoris: Secondary | ICD-10-CM | POA: Diagnosis not present

## 2013-06-08 DIAGNOSIS — Z45018 Encounter for adjustment and management of other part of cardiac pacemaker: Secondary | ICD-10-CM | POA: Diagnosis not present

## 2013-06-08 DIAGNOSIS — H409 Unspecified glaucoma: Secondary | ICD-10-CM | POA: Diagnosis not present

## 2013-06-09 DIAGNOSIS — I10 Essential (primary) hypertension: Secondary | ICD-10-CM | POA: Diagnosis not present

## 2013-06-09 DIAGNOSIS — H409 Unspecified glaucoma: Secondary | ICD-10-CM | POA: Diagnosis not present

## 2013-06-09 DIAGNOSIS — I2510 Atherosclerotic heart disease of native coronary artery without angina pectoris: Secondary | ICD-10-CM | POA: Diagnosis not present

## 2013-06-09 DIAGNOSIS — Z45018 Encounter for adjustment and management of other part of cardiac pacemaker: Secondary | ICD-10-CM | POA: Diagnosis not present

## 2013-06-09 DIAGNOSIS — I442 Atrioventricular block, complete: Secondary | ICD-10-CM | POA: Diagnosis not present

## 2013-06-09 DIAGNOSIS — K5090 Crohn's disease, unspecified, without complications: Secondary | ICD-10-CM | POA: Diagnosis not present

## 2013-06-10 DIAGNOSIS — I10 Essential (primary) hypertension: Secondary | ICD-10-CM | POA: Diagnosis not present

## 2013-06-10 DIAGNOSIS — H409 Unspecified glaucoma: Secondary | ICD-10-CM | POA: Diagnosis not present

## 2013-06-10 DIAGNOSIS — K5090 Crohn's disease, unspecified, without complications: Secondary | ICD-10-CM | POA: Diagnosis not present

## 2013-06-10 DIAGNOSIS — I442 Atrioventricular block, complete: Secondary | ICD-10-CM | POA: Diagnosis not present

## 2013-06-10 DIAGNOSIS — I2510 Atherosclerotic heart disease of native coronary artery without angina pectoris: Secondary | ICD-10-CM | POA: Diagnosis not present

## 2013-06-10 DIAGNOSIS — Z45018 Encounter for adjustment and management of other part of cardiac pacemaker: Secondary | ICD-10-CM | POA: Diagnosis not present

## 2013-06-11 DIAGNOSIS — H409 Unspecified glaucoma: Secondary | ICD-10-CM | POA: Diagnosis not present

## 2013-06-11 DIAGNOSIS — I10 Essential (primary) hypertension: Secondary | ICD-10-CM | POA: Diagnosis not present

## 2013-06-11 DIAGNOSIS — Z45018 Encounter for adjustment and management of other part of cardiac pacemaker: Secondary | ICD-10-CM | POA: Diagnosis not present

## 2013-06-11 DIAGNOSIS — I442 Atrioventricular block, complete: Secondary | ICD-10-CM | POA: Diagnosis not present

## 2013-06-11 DIAGNOSIS — I2510 Atherosclerotic heart disease of native coronary artery without angina pectoris: Secondary | ICD-10-CM | POA: Diagnosis not present

## 2013-06-11 DIAGNOSIS — K5090 Crohn's disease, unspecified, without complications: Secondary | ICD-10-CM | POA: Diagnosis not present

## 2013-06-15 DIAGNOSIS — K5090 Crohn's disease, unspecified, without complications: Secondary | ICD-10-CM | POA: Diagnosis not present

## 2013-06-15 DIAGNOSIS — Z45018 Encounter for adjustment and management of other part of cardiac pacemaker: Secondary | ICD-10-CM | POA: Diagnosis not present

## 2013-06-15 DIAGNOSIS — I10 Essential (primary) hypertension: Secondary | ICD-10-CM | POA: Diagnosis not present

## 2013-06-15 DIAGNOSIS — H409 Unspecified glaucoma: Secondary | ICD-10-CM | POA: Diagnosis not present

## 2013-06-15 DIAGNOSIS — I442 Atrioventricular block, complete: Secondary | ICD-10-CM | POA: Diagnosis not present

## 2013-06-15 DIAGNOSIS — I2510 Atherosclerotic heart disease of native coronary artery without angina pectoris: Secondary | ICD-10-CM | POA: Diagnosis not present

## 2013-06-16 ENCOUNTER — Telehealth: Payer: Self-pay | Admitting: Pulmonary Disease

## 2013-06-16 DIAGNOSIS — I442 Atrioventricular block, complete: Secondary | ICD-10-CM | POA: Diagnosis not present

## 2013-06-16 DIAGNOSIS — I2510 Atherosclerotic heart disease of native coronary artery without angina pectoris: Secondary | ICD-10-CM | POA: Diagnosis not present

## 2013-06-16 DIAGNOSIS — Z45018 Encounter for adjustment and management of other part of cardiac pacemaker: Secondary | ICD-10-CM | POA: Diagnosis not present

## 2013-06-16 DIAGNOSIS — K5090 Crohn's disease, unspecified, without complications: Secondary | ICD-10-CM | POA: Diagnosis not present

## 2013-06-16 DIAGNOSIS — I10 Essential (primary) hypertension: Secondary | ICD-10-CM | POA: Diagnosis not present

## 2013-06-16 DIAGNOSIS — H409 Unspecified glaucoma: Secondary | ICD-10-CM | POA: Diagnosis not present

## 2013-06-16 NOTE — Telephone Encounter (Signed)
Called and spoke with Edward Harvey. He is requesting VO to d/c nursing. He also states the family does not feel he needs this any longer either. I gave VO and nothing further needed

## 2013-06-17 DIAGNOSIS — I10 Essential (primary) hypertension: Secondary | ICD-10-CM | POA: Diagnosis not present

## 2013-06-17 DIAGNOSIS — Z45018 Encounter for adjustment and management of other part of cardiac pacemaker: Secondary | ICD-10-CM | POA: Diagnosis not present

## 2013-06-17 DIAGNOSIS — I442 Atrioventricular block, complete: Secondary | ICD-10-CM | POA: Diagnosis not present

## 2013-06-17 DIAGNOSIS — H409 Unspecified glaucoma: Secondary | ICD-10-CM | POA: Diagnosis not present

## 2013-06-17 DIAGNOSIS — I2510 Atherosclerotic heart disease of native coronary artery without angina pectoris: Secondary | ICD-10-CM | POA: Diagnosis not present

## 2013-06-17 DIAGNOSIS — K5090 Crohn's disease, unspecified, without complications: Secondary | ICD-10-CM | POA: Diagnosis not present

## 2013-06-18 ENCOUNTER — Encounter: Payer: Self-pay | Admitting: Podiatrist

## 2013-06-18 ENCOUNTER — Ambulatory Visit (INDEPENDENT_AMBULATORY_CARE_PROVIDER_SITE_OTHER): Payer: Medicare Other | Admitting: Podiatrist

## 2013-06-18 VITALS — BP 127/86 | HR 86 | Resp 12

## 2013-06-18 DIAGNOSIS — M79609 Pain in unspecified limb: Secondary | ICD-10-CM | POA: Diagnosis not present

## 2013-06-18 DIAGNOSIS — B351 Tinea unguium: Secondary | ICD-10-CM | POA: Diagnosis not present

## 2013-06-18 NOTE — Progress Notes (Deleted)
° °  Subjective:    Patient ID: Edward Harvey, male    DOB: 06-Sep-1915, 80 y.o.   MRN: [id number]  HPI I am here to get my toenails cut    Review of Systems     Objective:   Physical Exam        Assessment & Plan:

## 2013-06-21 NOTE — Progress Notes (Signed)
HPI:  Patient presents today for follow up of foot and nail care. Denies any new complaints today.  Objective:  Patients chart is reviewed.  Neurovascular status unchanged.  Patients nails are thickened, discolored, distrophic, friable and brittle with yellow-brown discoloration. Patient subjectively relates they are painful with shoes and with ambulation of bilateral feet.  Assessment:  Symptomatic onychomycosis  Plan:  Discussed treatment options and alternatives.  The symptomatic toenails were debrided through manual an mechanical means without complication.  Return appointment recommended at routine intervals of 3 months    Kathryn Egerton, DPM   

## 2013-06-22 DIAGNOSIS — I442 Atrioventricular block, complete: Secondary | ICD-10-CM | POA: Diagnosis not present

## 2013-06-22 DIAGNOSIS — I2510 Atherosclerotic heart disease of native coronary artery without angina pectoris: Secondary | ICD-10-CM | POA: Diagnosis not present

## 2013-06-22 DIAGNOSIS — H409 Unspecified glaucoma: Secondary | ICD-10-CM | POA: Diagnosis not present

## 2013-06-22 DIAGNOSIS — Z45018 Encounter for adjustment and management of other part of cardiac pacemaker: Secondary | ICD-10-CM | POA: Diagnosis not present

## 2013-06-22 DIAGNOSIS — K5090 Crohn's disease, unspecified, without complications: Secondary | ICD-10-CM | POA: Diagnosis not present

## 2013-06-22 DIAGNOSIS — I10 Essential (primary) hypertension: Secondary | ICD-10-CM | POA: Diagnosis not present

## 2013-06-23 ENCOUNTER — Telehealth: Payer: Self-pay | Admitting: Pulmonary Disease

## 2013-06-23 MED ORDER — CARVEDILOL 3.125 MG PO TABS: 6.2500 mg | ORAL_TABLET | Freq: Two times a day (BID) | ORAL | Status: DC

## 2013-06-23 NOTE — Telephone Encounter (Signed)
Called and spoke with pts daughter and she stated that the pt needs refills of the carvedilol and sanctura.  i have refilled the carvedilol but i cannot find the sanctura in the hospital d/c notes or in the pts med history,  i see where he has been taking the flomax, but the pharmacy has not filled the sanctura. The daughter stated that he was given this from the hospital when he was discharged.  SN please advise. Thanks  No Known Allergies    Current Outpatient Prescriptions on File Prior to Visit  Medication Sig Dispense Refill  . allopurinol (ZYLOPRIM) 300 MG tablet Take 300 mg by mouth daily.      Marland Kitchen aspirin 81 MG chewable tablet Chew 81 mg by mouth daily.       . brimonidine (ALPHAGAN P) 0.1 % SOLN Place 1 drop into both eyes 3 (three) times daily.       . Cholecalciferol (VITAMIN D) 1000 UNITS capsule Take 1,000 Units by mouth daily.        . diphenoxylate-atropine (LOMOTIL) 2.5-0.025 MG per tablet Take 1 tablet by mouth 4 (four) times daily as needed for diarrhea or loose stools.      . furosemide (LASIX) 20 MG tablet Take 1 tablet (20 mg total) by mouth every other day.  90 tablet  1  . hydrocortisone (ANUSOL-HC) 2.5 % rectal cream Place 1 application rectally daily as needed for hemorrhoids.      . mesalamine (LIALDA) 1.2 G EC tablet Take 1.2 g by mouth 2 (two) times daily.      . Multiple Vitamins-Minerals (CENTRUM SILVER PO) Take 1 tablet by mouth daily.        . quinapril (ACCUPRIL) 20 MG tablet Take 0.5 tablets (10 mg total) by mouth every evening.      . tamsulosin (FLOMAX) 0.4 MG CAPS TAKE ONE CAPSULE BY MOUTH EVERY DAY AFTER SUPPER  90 capsule  1   No current facility-administered medications on file prior to visit.

## 2013-06-24 DIAGNOSIS — I10 Essential (primary) hypertension: Secondary | ICD-10-CM | POA: Diagnosis not present

## 2013-06-24 DIAGNOSIS — K5090 Crohn's disease, unspecified, without complications: Secondary | ICD-10-CM | POA: Diagnosis not present

## 2013-06-24 DIAGNOSIS — I2510 Atherosclerotic heart disease of native coronary artery without angina pectoris: Secondary | ICD-10-CM | POA: Diagnosis not present

## 2013-06-24 DIAGNOSIS — I442 Atrioventricular block, complete: Secondary | ICD-10-CM | POA: Diagnosis not present

## 2013-06-24 DIAGNOSIS — Z45018 Encounter for adjustment and management of other part of cardiac pacemaker: Secondary | ICD-10-CM | POA: Diagnosis not present

## 2013-06-24 DIAGNOSIS — H409 Unspecified glaucoma: Secondary | ICD-10-CM | POA: Diagnosis not present

## 2013-06-24 NOTE — Telephone Encounter (Signed)
Per SN---  The hospital may have substituted the flomax with the sanctura but SN would rec to stay on the flomax.  The carvedilol has been sent to the pharmacy already.  thanks

## 2013-06-24 NOTE — Telephone Encounter (Signed)
I called and made [facility] aware. She voiced her understanding and needed nothing further

## 2013-06-29 DIAGNOSIS — Z45018 Encounter for adjustment and management of other part of cardiac pacemaker: Secondary | ICD-10-CM | POA: Diagnosis not present

## 2013-06-29 DIAGNOSIS — K5090 Crohn's disease, unspecified, without complications: Secondary | ICD-10-CM | POA: Diagnosis not present

## 2013-06-29 DIAGNOSIS — I442 Atrioventricular block, complete: Secondary | ICD-10-CM | POA: Diagnosis not present

## 2013-06-29 DIAGNOSIS — I2510 Atherosclerotic heart disease of native coronary artery without angina pectoris: Secondary | ICD-10-CM | POA: Diagnosis not present

## 2013-06-29 DIAGNOSIS — I10 Essential (primary) hypertension: Secondary | ICD-10-CM | POA: Diagnosis not present

## 2013-06-29 DIAGNOSIS — H409 Unspecified glaucoma: Secondary | ICD-10-CM | POA: Diagnosis not present

## 2013-06-30 ENCOUNTER — Encounter: Payer: Self-pay | Admitting: *Deleted

## 2013-07-01 DIAGNOSIS — I10 Essential (primary) hypertension: Secondary | ICD-10-CM | POA: Diagnosis not present

## 2013-07-01 DIAGNOSIS — K5090 Crohn's disease, unspecified, without complications: Secondary | ICD-10-CM | POA: Diagnosis not present

## 2013-07-01 DIAGNOSIS — Z45018 Encounter for adjustment and management of other part of cardiac pacemaker: Secondary | ICD-10-CM | POA: Diagnosis not present

## 2013-07-01 DIAGNOSIS — I2510 Atherosclerotic heart disease of native coronary artery without angina pectoris: Secondary | ICD-10-CM | POA: Diagnosis not present

## 2013-07-01 DIAGNOSIS — H409 Unspecified glaucoma: Secondary | ICD-10-CM | POA: Diagnosis not present

## 2013-07-01 DIAGNOSIS — I442 Atrioventricular block, complete: Secondary | ICD-10-CM | POA: Diagnosis not present

## 2013-07-06 ENCOUNTER — Telehealth: Payer: Self-pay | Admitting: *Deleted

## 2013-07-06 ENCOUNTER — Other Ambulatory Visit: Payer: Self-pay | Admitting: *Deleted

## 2013-07-06 ENCOUNTER — Ambulatory Visit (INDEPENDENT_AMBULATORY_CARE_PROVIDER_SITE_OTHER): Payer: Medicare Other | Admitting: Gastroenterology

## 2013-07-06 ENCOUNTER — Encounter: Payer: Self-pay | Admitting: Gastroenterology

## 2013-07-06 VITALS — BP 98/58 | HR 70 | Ht 73.0 in | Wt 209.0 lb

## 2013-07-06 DIAGNOSIS — R141 Gas pain: Secondary | ICD-10-CM

## 2013-07-06 DIAGNOSIS — R142 Eructation: Secondary | ICD-10-CM

## 2013-07-06 DIAGNOSIS — K649 Unspecified hemorrhoids: Secondary | ICD-10-CM

## 2013-07-06 DIAGNOSIS — R140 Abdominal distension (gaseous): Secondary | ICD-10-CM

## 2013-07-06 DIAGNOSIS — R143 Flatulence: Secondary | ICD-10-CM | POA: Diagnosis not present

## 2013-07-06 DIAGNOSIS — K5730 Diverticulosis of large intestine without perforation or abscess without bleeding: Secondary | ICD-10-CM

## 2013-07-06 DIAGNOSIS — Z9049 Acquired absence of other specified parts of digestive tract: Secondary | ICD-10-CM

## 2013-07-06 DIAGNOSIS — Z9889 Other specified postprocedural states: Secondary | ICD-10-CM

## 2013-07-06 DIAGNOSIS — K5010 Crohn's disease of large intestine without complications: Secondary | ICD-10-CM | POA: Diagnosis not present

## 2013-07-06 MED ORDER — QUINAPRIL HCL 10 MG PO TABS: 10.0000 mg | ORAL_TABLET | Freq: Every evening | ORAL | Status: DC

## 2013-07-06 NOTE — Telephone Encounter (Signed)
Pt son tells me dad's pressure is still running low, in 90s at doctor's office this morning. Explained to him reasoning of decrease in Quinapril and why not to return to old 20 mg dosing at this time. New prescription be sent to [facility], per his request for 10mg  tablets.  Given to [facility] to schedule f/u with Dr. Caryl Comes.   He verbalized understanding and agreeable to plan.

## 2013-07-06 NOTE — Telephone Encounter (Signed)
error 

## 2013-07-06 NOTE — Patient Instructions (Signed)
We have sent the following medications to your pharmacy for you to pick up at your convenience: Lialda  Samples of VSL # 3 was given today(PLEASE KEEP IN THE REFRIGERATOR)

## 2013-07-06 NOTE — Telephone Encounter (Signed)
Patients son called wanting to know if patient should follow medication instructions that are on his hospital discharge. He is particularly speaking of the accupril, the d/c summary states that he is to take 0.5 tablet daily and that is not how he normally takes it. After speaking to him I also realized that he needs an appointment with Dr Caryl Comes, so I can make him aware of that when I call him back.  Please advise. Thanks, MI

## 2013-07-06 NOTE — Progress Notes (Signed)
This is a 80 year old African American male who has had chronic diverticulosis with segmental colitis and is on Lialda 2.4 g a day.  He was recently hospitalized with severe diarrhea and syncope, and apparently he was overdosed of his blood pressure medication.  He has chronic diarrhea which is managed with when necessary Lomotil in addition to his amino salicylates.  He also has had recurrent problems with mixed hemorrhoids with some rectal discomfort.  His son Daiva Nakayama is with him today as usual.  He relates that the patient currently is having regular bowel movements without diarrhea or rectal bleeding since he has been receiving a better diet after his hospital discharge.  The patient herself denies rectal pain abdominal pain, rectal bleeding or other gastrointestinal issues at this time.  He has multiple medical problems listed and reviewed.  He's had some mild problems with dementia but is oriented x3 and is generally fairly bright  Current Medications, Allergies, Past Medical History, Past Surgical History, Family History and Social History were reviewed in [employer] record.  ROS: All systems were reviewed and are negative unless otherwise stated in the HPI.          Physical Exam: Awake and alert no acute distress and oriented x3.  Blood pressure 98/58, pulse 70 and regular and weight 209 with a BMI of 27.58.  Patient has never smoked.  His abdomen shows no distention, organomegaly, masses or tenderness.  Bowel sounds are normal.  Specks of rectum shows some posterior skin tags and a small posterior rectal wart.  Digital exam shows good rectal tone without masses or tenderness.  He appears to have a good squeeze pressure.  Stool is formed and is guaiac negative.  Mental status is normal.  His had a subtotal colectomy because of recurrent diverticulitis, and last sigmoid exam in 2008 showed severe segmental colitis versus Crohn's disease at his anastomotic areas.  Biopsy  review shows severe ileitis and colitis most consistent with inflammatory bowel disease.    Assessment and Plan: Elderly 80 year old patient with chronic diverticulosis coli of the severe nature with associated segmental colitis.  He currently appears to be doing well on Lialda 2.4 g a day and high fiber foods.  He has recurrent rectal problems which are managed with 2.5% local hydrocortisone cream to his rectum.  I see no need for repeat colonoscopy or other testing at this time.  I will give him a two-week trial of VSL #3 probiotic therapy to see if this helps some of his gas and bloating.  This is a very fragile and elderly patient who is taking care of in an excellent manner by is son.  Dr.  Hilarie Fredrickson will assume his gastroenterology care upon my retirement.  If he has recurrent bleeding or current diarrhea, she will probably need limited flexible sigmoidoscopy exam with nurse anesthesiology attendance.  However, he appears healthy today as I have seen him in several years.  I've stressed importance of discontinuing his Lialda and good high fiber diet.

## 2013-07-20 ENCOUNTER — Ambulatory Visit: Payer: Medicare Other | Admitting: Pulmonary Disease

## 2013-07-22 ENCOUNTER — Encounter: Payer: Medicare Other | Admitting: Internal Medicine

## 2013-07-22 ENCOUNTER — Telehealth: Payer: Self-pay | Admitting: Pulmonary Disease

## 2013-07-22 DIAGNOSIS — N3941 Urge incontinence: Secondary | ICD-10-CM | POA: Diagnosis not present

## 2013-07-22 NOTE — Telephone Encounter (Signed)
Appt r/s for 11/20 at 11:30.Edward Harvey

## 2013-07-22 NOTE — Telephone Encounter (Signed)
lmtcb x1 

## 2013-07-22 NOTE — Telephone Encounter (Signed)
Please advise where pt can be worked in at? thanks 

## 2013-07-22 NOTE — Telephone Encounter (Signed)
Ok to use 11:30 on 2/20 or can use 2/26 or 2/27.

## 2013-07-23 ENCOUNTER — Encounter: Payer: Self-pay | Admitting: Pulmonary Disease

## 2013-07-23 ENCOUNTER — Ambulatory Visit (INDEPENDENT_AMBULATORY_CARE_PROVIDER_SITE_OTHER): Payer: Medicare Other | Admitting: Pulmonary Disease

## 2013-07-23 VITALS — BP 124/70 | HR 63 | Temp 97.1°F | Ht 73.0 in | Wt 208.2 lb

## 2013-07-23 DIAGNOSIS — N289 Disorder of kidney and ureter, unspecified: Secondary | ICD-10-CM

## 2013-07-23 DIAGNOSIS — I10 Essential (primary) hypertension: Secondary | ICD-10-CM

## 2013-07-23 DIAGNOSIS — I2510 Atherosclerotic heart disease of native coronary artery without angina pectoris: Secondary | ICD-10-CM | POA: Diagnosis not present

## 2013-07-23 DIAGNOSIS — Z8546 Personal history of malignant neoplasm of prostate: Secondary | ICD-10-CM

## 2013-07-23 DIAGNOSIS — I442 Atrioventricular block, complete: Secondary | ICD-10-CM | POA: Diagnosis not present

## 2013-07-23 DIAGNOSIS — Z950 Presence of cardiac pacemaker: Secondary | ICD-10-CM

## 2013-07-23 DIAGNOSIS — M109 Gout, unspecified: Secondary | ICD-10-CM

## 2013-07-23 DIAGNOSIS — K5010 Crohn's disease of large intestine without complications: Secondary | ICD-10-CM

## 2013-07-23 DIAGNOSIS — M1990 Unspecified osteoarthritis, unspecified site: Secondary | ICD-10-CM

## 2013-07-23 DIAGNOSIS — R269 Unspecified abnormalities of gait and mobility: Secondary | ICD-10-CM

## 2013-07-23 DIAGNOSIS — I4891 Unspecified atrial fibrillation: Secondary | ICD-10-CM | POA: Diagnosis not present

## 2013-07-23 DIAGNOSIS — K5731 Diverticulosis of large intestine without perforation or abscess with bleeding: Secondary | ICD-10-CM

## 2013-07-23 DIAGNOSIS — F0390 Unspecified dementia without behavioral disturbance: Secondary | ICD-10-CM

## 2013-07-23 MED ORDER — CARVEDILOL 3.125 MG PO TABS: 6.2500 mg | ORAL_TABLET | Freq: Two times a day (BID) | ORAL | Status: DC

## 2013-07-23 NOTE — Patient Instructions (Signed)
Today we updated your med list in our EPIC system...    Continue your current medications the same...    We refilled your Coreg per request...  Call for any questions...  Let's plan a follow up visit in 60mo, sooner if needed for problems.Marland KitchenMarland Kitchen

## 2013-07-23 NOTE — Progress Notes (Signed)
Subjective:    Patient ID: Edward Harvey, male    DOB: January 18, 1916, 80 y.o.   MRN: [id number]  HPI 80 y/o BM here for a follow up visit... he has multiple medical problems including HBP, CAD, & complete heart block w/ pacer followed by DrWall & DrKlein;  hx of GI bleed from divertics w/ subtotal colectomy in 2003 & followed by DrPatterson;  hx SBO, inguinal hernias, gallstones;  hx prostate cancer followed by DrOttelin;  DJD & Gout;  hx subdural hematoma prev requiring burr hole, & mild senile dementia...  ~  February 05, 2012:  46mo ROV & Edward Harvey denies any problems other than "getting older"; he notes the vision in his left eye is getting worse, on 3 diff eye drops, & he stopped driving 4/09 (Dr Gershon Crane sent him to [facility] for 2nd opinion);  BP is well controlled on Coreg, Quinapril, & Lasix;  He denies CP, palpit, ch in SOB or edema;  GI is ok w/o recent problems, but his urine in slower & we discussed trial FLOMAX 0.4mg  Qhs;  Overall he is quite remarkable for 80 y/o... We reviewed prob list, meds, xrays and labs> see below for updates >>  ~  March 19, 2012:  Add-on at pt request> he saw TP 02/26/12 c/o hoarseness & bad taste, phlegm in throat, right lower rib cage discomfort; exam was neg & he was rec to take Claritin5mg , nasal saline, heating pad to ribs , and Advil prn;  He reports that his symptoms have all abated, feeling back to baseline, "just older & slower" he reiterates...  We reviewed prob list, meds, xrays and labs> see below for updates >>   ~  June 09, 2102:  66mo ROV & Edward Harvey indicates he had some diarrhea, saw DrPatterson & now better w/ addition of Lialda 1.2gmBid for Crohn's (but he thinks it was salads); We reviewed the following medical problems during today's office visit>>     AB> no recent URI or breathing prob reported...    HBP> on ASA81, Coreg6.25Bid, Quinapril20, Lasix20; wt is up 8# on diet to 225#; BP= 118/70...    CAD/ AVblock/ Pacer> on meds above; he denies CP,  palpit, ch in dyspnea or edema...    Divertics w/ hx hemorrhage/ Hx SBO> he denies abd pain, N/V/C; recent diarrhea improved w/ Lialda.    Hx Prostate Cancer> followed by DrOttelin but we don't have recent notes...     DJD/ Gout> on Allopurinol300, OTC analgesics prn & MVI/ Vit D supplement;     Hx subdural hematoma> required burr holes back then; no known sequellae & he hasn't been falling... We reviewed prob list, meds, xrays and labs> see below for updates >>  LABS 1/14:  Chems- wnl w/ Creat=1.4 Alb=3.0;  CBC- ok w/ Hg=11.9;  TSH=1.76;  Sed=38... rec attn to diet & continue Lialda.  ~  September 08, 2012:  66mo ROV & post hosp check> He was [facility] 4/6-7/14 after he got too hot in church he says; DCSummary indicates syncope assoc w/ BUN~25, Cr~1.4, Hg~10, CXR- NAD; given IVF & improved, no postural BP changes; he knows to avoid sodium & is taking Lasix20/d... Feels he is back to baseline- wants to restart his PT w/ [facility]- ok... We reviewed the following medical problems during today's office visit >>     AB> no recent URI or breathing prob reported...    HBP> on ASA81, Coreg6.25Bid, Quinapril20, Lasix20; wt is down 1# on diet to 224#; BP= 140/78 & he  feels he is stable...    CAD/ AVblock/ Pacer> on meds above; he denies CP, palpit, ch in dyspnea or edema...    Divertics w/ hx hemorrhage/ Hx SBO> he denies abd pain, N/V/C; recent diarrhea improved w/ Lialda1.2Bid    Hx Prostate Cancer> on Flomax0.4; followed by DrOttelin but we don't have recent notes...     DJD/ Gout> on Allopurinol300, OTC analgesics prn & MVI/ Vit D supplement;     Hx subdural hematoma> required burr holes back then; no known sequellae & he hasn't been falling... We reviewed prob list, meds, xrays and labs> see below for updates >>   ~  December 08, 2012:  39mo ROV & Edward Harvey has lost 5# down to 219# & he has less edema despite cutting his Lasix to 20mg /d last OV; he notes incr urinary freq during the day (on Flomax0.4) but denies  nocturia! We discussed low sodium & incr exercise...  He saw DrWall 7/14 for f/u CAD, heart block, pacer- doing satis & pacer check is ok; he will f/u w/ DrKlein in 53mo; BP= 116/72 on his Coreg6.25Bid, Accupril20, & Lasix20;  His son says that he was c/o some diarrhea but pt denies to me & remains on Lialda 1.2gmBid...     We reviewed prob list, meds, xrays and labs> see below for updates >>  LABS 7/14:  Chems- ok w/ Cr=1.3;  CBC- okw/ Hg=11.7  ~  March 24, 2013:  3-63mo ROV & Edward Harvey CC is abd discomfort that comes and goes- burning, diarrhea, etc; he is followed by [facility] w/ divertics, hx Crohn's colitis w/ colonic bleed requiring subtotal colectomy from the ileum to the sigmoid in 2003, SBO 2011, Bilat inguinal hernias and gallstones; he takes Lialda1.2gmBid, AnusolHC cream, and Lomotil vs Miralax prn (he has GI f/u planned soon)...  He is still getting PT at home one day per week... Breathing has been OK, he denies CP & cardiac stable... Followed by DrOttelin for Urology    We reviewed prob list, meds, xrays and labs> see below for updates >> he had the 2014 Flu vaccine in Oct...   ~  July 23, 2013:  21mo ROV & post hosp visit> he was Adm 12/24 - 05/29/13 by [facility] after being found unresponsive in his chair, family called 911 & he came around but was hypotensive & brought to ER, he had been having diarrhea & was dehydrated (Cr=1.7=>1.0), no infection found, BP meds were adjusted down; he has Hx Crohn's dis & controlled on Lialda1.2gmBid and Lomotil added for prn use; he has pacer & this checked out OK... We reviewed the following medical problems during today's office visit >>     AB> no recent URI or breathing prob reported; he has not required breathing meds...    HBP> on ASA81, Coreg3.125- taking ?1or2 Bid, Quinapril10, Lasix20; wt is down 3# on diet to 208#; BP= 124/70 & he feels he is stable...    CAD/ AVblock/ Pacer> on meds above; he denies CP, palpit, ch in dyspnea, but he has some  pedal edema after the hydration...    Ven Insuffi, Edema>  Reminded to elim salt, elevate legs, wear support hose, & take the Lasix20 daily...    Divertics w/ hx hemorrhage/ Hx SBO/ segmental colitis w/ diarrhea> he denies abd pain, N/V/C; recent diarrhea improved w/ Lialda1.2Bid & lomotil prn; he saw DrPatterson 2/15 & stable...    Hx Prostate Cancer> on Flomax0.4 & Sanctura 60 added yest; followed by DrOttelin but we don't have  recent notes...     DJD/ Gout> on Allopurinol300, OTC analgesics prn & MVI/ Vit D supplement;     Hx subdural hematoma, senile dementia> required burr holes back then; no known sequellae & he hasn't been falling...    Derm> Podiatry trims his mycotically infected toenails We reviewed the following medical problems during today's office visit >> THEY DID NOT BRING MED BOTTLES TO THE OV TODAY... CXR 12/14 showed cardiomeg/ vasc congestion/ interstitial edema, basilar atx, right sided pacer, R>L glenohumeral arthritis... EKG 12/14 showed pacer rhythm, rate72, no change... CT Head 12/14 showed NAD, atrophy, bilat burr holes, and right max sinus dis...  LABS 12/14 reviewed> Chems- ok w/ Cr improved from 1.7 => 1.0 w/ hydration;  CBC- ok but Hg~11;  TSH=0.63...         Problem List:    GLAUCOMA (ICD-365.9) - prev followed by [facility] w/ bilat cataract surg & glaucoma on eye 3 diff drops as noted...  HEARING LOSS - son says he has 3 sets of hearing aides but won't wear any of them...  Hx of ASTHMATIC BRONCHITIS, ACUTE (ICD-466.0) - he has never smoked... hx recurrent bronchitic infections over the yrs w/ reactive airway component... no recent prob, and no regular meds required.  ~  CXR 5/13 showed stable hrt size & pacer, clear lungs, degen changes in Tspine, NAD.Marland Kitchen. ~  CXR 4/14 showed heart at upper lim normal, totuous Ao, pacer, clear lungs/ stable scarring at left base/ NAD.Marland Kitchen. ~  CXR 12/14 showed cardiomeg/ vasc congestion/ interstitial edema, basilar atx, right sided  pacer, R>L glenohumeral arthritis  HYPERTENSION (ICD-401.9) >>  ~  on ASA 81mg /d, COREG 6.25Bid, QUINAPRIL 20mg Bid, LASIX 20mg /d... He takes his own meds & states no problems. ~  4/12:  BP= 110/74 & feeling well... he denies HA, visual changes, CP, palipit, dizziness, syncope, change in dyspnea, etc... ~  8/12:  BP= 110/68 & continues stable... ~  12/12:  BP= 116/78 & he remains asymptomatic... ~  5/13:  BP= 126/80 & he continues to deny CP, palpit, SOB, edema, etc... ~  9/13:  BP= 126/78 & he denies CP, palpit, ch in SOB or edema... ~  10/13:  BP= 102/66 & he remains largely asymptomatic... ~  1/14:  BP= 118/70 & he continues to deny CP, palpit, ch in SOB/ edema... ~  4/14: on ASA81, Coreg6.25Bid, Quinapril20, Lasix20; wt is down 1# on diet to 224#; BP= 140/78 & he feels he is stable. ~  7/14: on ASA81, Coreg6.25Bid, Quinapril20, Lasix20; weight is down 5# to 219# & BP= 116/72 ~  2/15: on ASA81, Coreg3.125- 1or2Bid, Quinapril10, Lasix20; weight is down further to 208# today; BP= 124/70...  CORONARY ARTERY DISEASE (ICD-414.00) - followed by DrWall for Cardiology on above meds... pt has not had a prev cath... ~  2DEcho 4/05 showed mild conc LVH and norm LVF- improved from 2002 when EF= 45%... ~  NuclearStressTest 10/06 showed prior inferoseptal & apical infarct w/ apical AK, w/o ischemia, EF= 48%... no change from prev. ~  EKG 7/14 showed dual paced rhythm... ~  EKG 12/14: pacer rhythm, rate72, no change...  Hx of AV BLOCK, COMPLETE (ICD-426.0), & CARDIAC PACEMAKER IN SITU (ICD-V45.01) - he had a pacemaker placed for complete heart block initially in 1994, & this was exchanged for a new dual chamber pacer: Joylene Draft DDDR model [phone] by [facility] 6/06... ~  seen by DrKlein 1/10 w/ pacer check OK, no further episodes of AFib on his monitor... not a Coumadin candidate due  to age, unsteady, hx of GI bleeds, and prev subdural hematoma. ~  followed regularly by DrKlein's pacer clinic (last note 10/12  reviewed)> stable, doing well, no changes made.  DIVERTICULOSIS OF COLON WITH HEMORRHAGE (ICD-562.12)  GASTROINTESTINAL HEMORRHAGE (ICD-578.9) - he had a subtotal colectomy w/ ileum to distal sigmoid anastomosis in Jul03 by DrNewman... Hx SEGMENTAL COLITIS >> see below  ~  EGD 8/03 by Demetra Shiner was normal... ~  colonoscopy 1/08 by DrPatterson showed inflammed mucosa in sm bowel prox to the ileo-colonic anastomosis, few divertics in remaining distal colon, no polyps etc...  ~  f/u colonoscopy 5/09 showed prev colectomy- anastomosis granular & bleeding ?Crohn's... also had divertics & hems... Rx'd w/ Lialda.  CROHN'S COLITIS >>  Hx of SMALL BOWEL OBSTRUCTION (ICD-560.9) > see [id number] Hospitalization by [facility]... ~  Abd Sonar 4/11 showed mult gallstones in GB, echogenic renal parenchyma c/w medical renal dis... ~  CT Abd 4/11 showed mild atx at bases, sl GB Arntson thickening & ductal dilatation (known gallstones), mult cysts in spleen & kidneys, ectatic ao, bilat fat filled inguinal hernias, degen changes in spine... ~  12/13: he had f/u DrPatterson & note reviewed> hx segmental colitis assoc w/ severe diverticulosis & prev left colon resection; restarted LIALDA 1.2gm Bid for diarrhea & improved... ~  8/14: he had f/u DrPatterson w/ hx segmental colitis and divertics, occas loose stools, on Lialda2.4gm/d and rec to take benefiber sprinkled on food daily; ok to use immodium or lomotil as needed...  ~  2/15: he had f/u DrPatterson> hx diverticulosis & a segmental colitis treated w/ Lialda1.2gmBid & prn Lomotil; diarrhea has resolved, stool was neg for blood, hx subtotal colectomy because of recurrent diverticulitis- last sigmoid exam in 2008 showed severe segmental colitis versus Crohn's disease at his anastomotic areas ( path showed severe ileitis and colitis most consistent with inflammatory bowel disease)...   INGUINAL HERNIA (ICD-550.90) - he has bilat fat containing inguinal hernias seen on CT Abd in  2008...   GALLSTONES (ICD-574.20) - Sonar 4/11 showed mult gallstones filling the gallbladder, and CT Abd 4/11 showed mild extrahep biliary ductal dilatation... he was evaluated by DrPatterson/ GI, DrNewman/ Surg, DrWall/ Cards> all agreed that risk was hi & to hold off on surg unless absolutely necessary... ~  4/12 & 8/12:  He remains asymptomatic w/o abd pain, N/V, etc... ~  2013 & 2014:  He remained stable & no surg planned...  RENAL INSUFFICIENCY >> Creatinines as below CARCINOMA, PROSTATE, HX OF (ICD-V10.46) - diagnosed in 1993 & treated w/ XRT... PSA initially ~29 and dropped to ~1 after XRT & it has remained there ever since... followed by DrOttelin- also has BPH, min obstructive symptoms, bilat hydroceles & some benign renal cysts... ~  labs 3/07 by Urology showed PSA= 1.19 ~  labs here 1/10 showed PSA= 1.37 ~  5/11: he reports f/u DrOttelin w/ incontinence symptoms Rx'd Sanctura XR 60mg /d & improved, so he stopped this on his own & states that he is doing satis now... ~  He continues to f/u w/ DrOttelin regularly- we do not have recent notes from Urology... ~  9/13:  He notes slower urinary stream & we decided to try [facility] 0.4mg Qhs... ~  Labs 1/14 showed BUN= 23, creat= 1.4 ~  Labs 7/14 showed BUN= 21, Creat= 1.3 ~  Labs 12/14 Hosp showed Cr=1.7 => 1.0 by discharge after hydration... ~  2/15: he saw DrOttelin for Urology f/u> on Tamsulosin0.4 & they restarted Sanctura60 to help w/ urgency & incont  DEGENERATIVE JOINT DISEASE (ICD-715.90) >> Hx of GOUT (ICD-274.9) >> he has hx of DJD and Gout treated w/ TRAMADOL Prn & ALLOPURINOL 300mg /d; also takes MVI & VIT D 1000 u daily... ~  Labs 5/13 showed Uric= 5.8 ~  XRays show arthritis in shoulders L>R...  SENILE DEMENTIA >> he is 80 y/o HEMATOMA, SUBDURAL (ICD-432.1) - he had a burr hole placed for subdural hematoma in the past...  ~  CT Head 12/14 showed NAD, atrophy, bilat burr holes, and right max sinus dis...   Past Surgical  History  Procedure Laterality Date  . Pacemaker placement    . Cataract extraction, bilateral    . Subtotal colectomy  12/03    Dr. Lucia Gaskins  . Lipoma excision  2001    left leg  . Burr hole for subdural hematoma      "he's had 4 ORs"/notes 10/29/2001 (05/28/2013)  . Insert / replace / remove pacemaker  1994; 11/2004    Archie Endo 10/29/2001; generator change/notes 11/23/2004  (05/28/2013)  . Pericardiocentesis  1994    post pacemaker placement/notes 03/12/2005 (05/28/2013)  . Tonsillectomy and adenoidectomy  age 61    03/12/2005 (05/28/2013)  . Colonoscopy  2009    Diverticulosis and Hemorrhoids     Outpatient Encounter Prescriptions as of 07/23/2013  Medication Sig  . allopurinol (ZYLOPRIM) 300 MG tablet Take 300 mg by mouth daily.  Marland Kitchen aspirin 81 MG chewable tablet Chew 81 mg by mouth daily.   . brimonidine (ALPHAGAN P) 0.1 % SOLN Place 1 drop into both eyes 3 (three) times daily.   . carvedilol (COREG) 3.125 MG tablet Take 2 tablets (6.25 mg total) by mouth 2 (two) times daily with a meal.  . Cholecalciferol (VITAMIN D) 1000 UNITS capsule Take 1,000 Units by mouth daily.    . diphenoxylate-atropine (LOMOTIL) 2.5-0.025 MG per tablet Take 1 tablet by mouth 4 (four) times daily as needed for diarrhea or loose stools.  . furosemide (LASIX) 20 MG tablet Take 1 tablet (20 mg total) by mouth every other day.  . hydrocortisone (ANUSOL-HC) 2.5 % rectal cream Place 1 application rectally daily as needed for hemorrhoids.  . mesalamine (LIALDA) 1.2 G EC tablet Take 1.2 g by mouth 2 (two) times daily.  . Multiple Vitamins-Minerals (CENTRUM SILVER PO) Take 1 tablet by mouth daily.    . quinapril (ACCUPRIL) 10 MG tablet Take 1 tablet (10 mg total) by mouth every evening.  . tamsulosin (FLOMAX) 0.4 MG CAPS TAKE ONE CAPSULE BY MOUTH EVERY DAY AFTER SUPPER  . Trospium Chloride (SANCTURA XR) 60 MG CP24 Take 1 capsule by mouth daily.  . [DISCONTINUED] carvedilol (COREG) 3.125 MG tablet Take 2 tablets (6.25 mg  total) by mouth 2 (two) times daily with a meal.    No Known Allergies   Current Medications, Allergies, Past Medical History, Past Surgical History, Family History, and Social History were reviewed in [employer] record.    Review of Systems        See HPI - all other systems neg except as noted... The patient complains of dyspnea on exertion, peripheral edema, muscle weakness, and difficulty walking.  The patient denies anorexia, fever, weight loss, weight gain, hoarseness, chest pain, syncope, prolonged cough, headaches, hemoptysis, abdominal pain, melena, hematochezia, severe indigestion/heartburn, hematuria, incontinence, suspicious skin lesions, transient blindness, depression, unusual weight change, abnormal bleeding, enlarged lymph nodes, and angioedema.     Objective:   Physical Exam     WD, overweight, 79 y/o BM in NAD.Marland KitchenMarland Kitchen  GENERAL:  Alert, pleasant & cooperative;  VS- reviewed... HEENT:  [place]/AT, EOM- sl strabismus, PERRLA, EACs-clear (HOH), TMs-wnl, NOSE-clear discharge , THROAT-clear & wnl. NECK:  Supple w/ decrROM; no JVD; normal carotid impulses w/o bruits; no thyromegaly or nodules palpated; no lymphadenopathy. CHEST:  Clear to P & A; without wheezes/ rales/ or rhonchi heard;  Pacer palp in right chest... HEART:  Regular Rhythm; without murmurs/ rubs/ or gallops detected... ABDOMEN:  Scar of prev surg, soft & nontender; normal bowel sounds; no organomegaly or masses palpated...  EXT:  Mod-severe arthritic changes; no varicose veins/ +venous insuffic/ 2+ edema. NEURO:  CN's intact; motor testing normal; sensory testing inconsistant; gait is abnormal & balance only fair (uses walker)... DERM:  No lesions noted; no rash etc...  RADIOLOGY DATA:  Reviewed in the EPIC EMR & discussed w/ the patient...  LABORATORY DATA:  Reviewed in the EPIC EMR & discussed w/ the patient...   Assessment & Plan:    HBP>  BP stable on reduced doses of meds that son  controls, continue same meds & reminded to bring all med BOTTLES to his f/u OVs...  CAD, PACER>  He denies angina, palpit, syncope, etc;  Continue cardiology f/u w/ DrTomWall & DrKlein.  Divertics, Crohn's>  He has hx GI hemorrhage in past w/ part colectomy; Hx Crohn's colitis w/ diarrhea- improved on Lialda...  Gallstones>  He remains asymptomatic...  Prostate Ca>  Followed by DrOttelin & stable on Flomax & Sanctura...  DJD>  This is his main issue w/ mobility, self care, remaining independent...  Hx Subdural Hematoma & Senile dementia>  Stable w/o acute problems, he declines neuro eval etc...Marland KitchenMarland KitchenMarland Kitchen   Patient's Medications  New Prescriptions   No medications on file  Previous Medications   ALLOPURINOL (ZYLOPRIM) 300 MG TABLET    Take 300 mg by mouth daily.   ASPIRIN 81 MG CHEWABLE TABLET    Chew 81 mg by mouth daily.    BRIMONIDINE (ALPHAGAN P) 0.1 % SOLN    Place 1 drop into both eyes 3 (three) times daily.    CHOLECALCIFEROL (VITAMIN D) 1000 UNITS CAPSULE    Take 1,000 Units by mouth daily.     DIPHENOXYLATE-ATROPINE (LOMOTIL) 2.5-0.025 MG PER TABLET    Take 1 tablet by mouth 4 (four) times daily as needed for diarrhea or loose stools.   FUROSEMIDE (LASIX) 20 MG TABLET    Take 1 tablet (20 mg total) by mouth every other day.   HYDROCORTISONE (ANUSOL-HC) 2.5 % RECTAL CREAM    Place 1 application rectally daily as needed for hemorrhoids.   MESALAMINE (LIALDA) 1.2 G EC TABLET    Take 1.2 g by mouth 2 (two) times daily.   MULTIPLE VITAMINS-MINERALS (CENTRUM SILVER PO)    Take 1 tablet by mouth daily.     QUINAPRIL (ACCUPRIL) 10 MG TABLET    Take 1 tablet (10 mg total) by mouth every evening.   TAMSULOSIN (FLOMAX) 0.4 MG CAPS    TAKE ONE CAPSULE BY MOUTH EVERY DAY AFTER SUPPER   TROSPIUM CHLORIDE (SANCTURA XR) 60 MG CP24    Take 1 capsule by mouth daily.  Modified Medications   Modified Medication Previous Medication   CARVEDILOL (COREG) 3.125 MG TABLET carvedilol (COREG) 3.125 MG tablet       Take 2 tablets (6.25 mg total) by mouth 2 (two) times daily with a meal.    Take 2 tablets (6.25 mg total) by mouth 2 (two) times daily with a meal.  Discontinued Medications  No medications on file

## 2013-07-28 ENCOUNTER — Encounter: Payer: Medicare Other | Admitting: Internal Medicine

## 2013-08-04 ENCOUNTER — Other Ambulatory Visit: Payer: Self-pay | Admitting: *Deleted

## 2013-08-04 ENCOUNTER — Other Ambulatory Visit (INDEPENDENT_AMBULATORY_CARE_PROVIDER_SITE_OTHER): Payer: Medicare Other

## 2013-08-04 ENCOUNTER — Encounter: Payer: Self-pay | Admitting: Physician Assistant

## 2013-08-04 ENCOUNTER — Telehealth: Payer: Self-pay | Admitting: Gastroenterology

## 2013-08-04 ENCOUNTER — Ambulatory Visit (INDEPENDENT_AMBULATORY_CARE_PROVIDER_SITE_OTHER): Payer: Medicare Other | Admitting: Physician Assistant

## 2013-08-04 VITALS — BP 110/64 | HR 72 | Ht 73.0 in | Wt 208.0 lb

## 2013-08-04 DIAGNOSIS — K625 Hemorrhage of anus and rectum: Secondary | ICD-10-CM

## 2013-08-04 DIAGNOSIS — I2510 Atherosclerotic heart disease of native coronary artery without angina pectoris: Secondary | ICD-10-CM | POA: Diagnosis not present

## 2013-08-04 DIAGNOSIS — R197 Diarrhea, unspecified: Secondary | ICD-10-CM | POA: Diagnosis not present

## 2013-08-04 DIAGNOSIS — K5010 Crohn's disease of large intestine without complications: Secondary | ICD-10-CM

## 2013-08-04 DIAGNOSIS — K529 Noninfective gastroenteritis and colitis, unspecified: Secondary | ICD-10-CM

## 2013-08-04 LAB — CBC WITH DIFFERENTIAL/PLATELET
BASOS ABS: 0 10*3/uL (ref 0.0–0.1)
Basophils Relative: 0.4 % (ref 0.0–3.0)
EOS ABS: 0.1 10*3/uL (ref 0.0–0.7)
Eosinophils Relative: 1.2 % (ref 0.0–5.0)
HCT: 36 % — ABNORMAL LOW (ref 39.0–52.0)
Hemoglobin: 11.8 g/dL — ABNORMAL LOW (ref 13.0–17.0)
LYMPHS PCT: 28.1 % (ref 12.0–46.0)
Lymphs Abs: 1.9 10*3/uL (ref 0.7–4.0)
MCHC: 32.6 g/dL (ref 30.0–36.0)
MCV: 103.6 fl — AB (ref 78.0–100.0)
MONO ABS: 0.8 10*3/uL (ref 0.1–1.0)
Monocytes Relative: 11.2 % (ref 3.0–12.0)
NEUTROS PCT: 59.1 % (ref 43.0–77.0)
Neutro Abs: 4 10*3/uL (ref 1.4–7.7)
PLATELETS: 186 10*3/uL (ref 150.0–400.0)
RBC: 3.48 Mil/uL — ABNORMAL LOW (ref 4.22–5.81)
RDW: 15.3 % — AB (ref 11.5–14.6)
WBC: 6.7 10*3/uL (ref 4.5–10.5)

## 2013-08-04 MED ORDER — DIPHENOXYLATE-ATROPINE 2.5-0.025 MG PO TABS: ORAL_TABLET | ORAL | Status: DC

## 2013-08-04 MED ORDER — MESALAMINE 1.2 G PO TBEC: 1.2000 g | DELAYED_RELEASE_TABLET | Freq: Two times a day (BID) | ORAL | Status: DC

## 2013-08-04 NOTE — Patient Instructions (Signed)
We have given you a prescription for lomotil to take to the pharmacy. We sent a prescription for Lialda. [facility] [address]

## 2013-08-04 NOTE — Telephone Encounter (Signed)
Spoke with patient's daughter and she states patient is having some rectal bleeding.Former Careers information officer patient that per last OV note will be a [facility] patient. Offered OV today with extender but she states patient is not a home now as he has gone out to get his hair cut. No appointments available for extender on Thursday or Friday. Scheduled patient with Alonza Bogus, PA on 08/09/13 at 10:00 AM. Do you want patient to come for labs this week? Please, advise.

## 2013-08-04 NOTE — Telephone Encounter (Signed)
Patient's daughter called back and wants to bring patient today. OV scheduled with Amy Esterwood, PA at 3:00 PM. CBC prior to OV.

## 2013-08-04 NOTE — Telephone Encounter (Signed)
Cbc some time this week please

## 2013-08-04 NOTE — Progress Notes (Addendum)
Subjective:    Patient ID: Edward Harvey, male    DOB: 01/13/1916, 80 y.o.   MRN: [id number]  HPI [name] is a 80 year old African American male known to Dr. Sharlett Iles with history of chronic diverticulosis status post subtotal colectomy for diverticular bleeding who also has history of segmental colitis. He is being maintained on Lee all the 2.4 g daily. He was hospitalized in January 2015 with an episode of more severe diarrhea and syncope and apparently was felt that he was on too high a dose of blood pressure medicine during that admission. Patient has had ongoing chronic problems with diarrhea and has been on both Imodium and Lomotil in the past. They are currently using Imodium one or 2 daily as needed but the patient's son says that the patient is very reluctant to take the Imodium because he thinks cc just "on too many medicines". Apparently he has been taking his Truman [place]ward all day as instructed.  Family called today concerned that he may be bleeding as his son had seen some dark blood on the commode. The patient himself denies any abdominal discomfort. He says he has not noticed any blood. Patient's son says that some days he may have 45 bowel movements a day and other days just 1 or 2. He generally is not in continent but he has been encouraged to wear depends. Again patient has had significant bleeding problems in the past from diverticular hemorrhage is and he also had an upper GI bleed in 2011 due to an antral ulcer with visible vessel. Other medical problems include hypertension coronary artery disease history of atrial fibrillation he is status post pacemaker placement for complete heart block has history of asthmatic bronchitis prostate cancer mild dementia chronic renal insufficiency and a remote history of a post traumatic subdural hematoma.   Review of Systems  Constitutional: Negative.   HENT: Negative.   Eyes: Negative.   Respiratory: Negative.   Cardiovascular: Negative.     Gastrointestinal: Positive for diarrhea and blood in stool.  Endocrine: Negative.   Genitourinary: Negative.   Musculoskeletal: Positive for gait problem.  Skin: Negative.   Allergic/Immunologic: Negative.   Neurological: Negative.   Hematological: Negative.   Psychiatric/Behavioral: Negative.    Outpatient Prescriptions Prior to Visit  Medication Sig Dispense Refill  . allopurinol (ZYLOPRIM) 300 MG tablet Take 300 mg by mouth daily.      Marland Kitchen aspirin 81 MG chewable tablet Chew 81 mg by mouth daily.       . brimonidine (ALPHAGAN P) 0.1 % SOLN Place 1 drop into both eyes 3 (three) times daily.       . carvedilol (COREG) 3.125 MG tablet Take 2 tablets (6.25 mg total) by mouth 2 (two) times daily with a meal.  60 tablet  1  . Cholecalciferol (VITAMIN D) 1000 UNITS capsule Take 1,000 Units by mouth daily.        . diphenoxylate-atropine (LOMOTIL) 2.5-0.025 MG per tablet Take 1 tablet by mouth 4 (four) times daily as needed for diarrhea or loose stools.      . furosemide (LASIX) 20 MG tablet Take 1 tablet (20 mg total) by mouth every other day.  90 tablet  1  . hydrocortisone (ANUSOL-HC) 2.5 % rectal cream Place 1 application rectally daily as needed for hemorrhoids.      . mesalamine (LIALDA) 1.2 G EC tablet Take 1.2 g by mouth 2 (two) times daily.      . Multiple Vitamins-Minerals (CENTRUM SILVER PO) Take 1  tablet by mouth daily.        . quinapril (ACCUPRIL) 10 MG tablet Take 1 tablet (10 mg total) by mouth every evening.  30 tablet  3  . tamsulosin (FLOMAX) 0.4 MG CAPS TAKE ONE CAPSULE BY MOUTH EVERY DAY AFTER SUPPER  90 capsule  1  . Trospium Chloride (SANCTURA XR) 60 MG CP24 Take 1 capsule by mouth daily.       No facility-administered medications prior to visit.   No Known Allergies Patient Active Problem List   Diagnosis Date Noted  . Hypotension 05/26/2013  . H/O prostate cancer 12/08/2012  . Syncope 09/06/2012  . Crohn's colitis 06/09/2012  . Renal insufficiency 06/09/2012  .  History of subdural hematoma (post traumatic) 06/09/2012  . Gait abnormality 06/09/2012  . Atrial fibrillation 03/25/2012  . Senile dementia 03/19/2012  . PPM-St.Jude 01/14/2011  . SMALL BOWEL OBSTRUCTION 09/11/2009  . Diarrhea 09/05/2009  . GOUT 06/16/2008  . CORONARY ARTERY DISEASE 06/16/2008  . AV BLOCK, COMPLETE 06/16/2008  . ASTHMATIC BRONCHITIS, ACUTE 06/16/2008  . INGUINAL HERNIA 06/16/2008  . Diverticulosis of colon with hemorrhage 06/16/2008  . DEGENERATIVE JOINT DISEASE 06/16/2008  . HYPERTENSION 11/26/2007  . CARCINOMA, PROSTATE, HX OF 11/26/2007       History  Substance Use Topics  . Smoking status: Never Smoker   . Smokeless tobacco: Never Used  . Alcohol Use: No     Comment: Occ wine    family history is not on file.  Objective:   Physical Exam  well-developed elderly African American male in no acute distress accompanied by his son. Blood pressure 110/64 pulse 72 height 6 foot 1 weight 208. HEENT; nontraumatic normocephalic EOMI PERRLA sclera anicteric, Supple; no JVD, Cardiovascular; regular rate and rhythm with S1-S2 there's soft systolic murmur, Pulmonary; clear bilaterally, Abdomen ;soft nontender nondistended bowel sounds are active there is no palpable mass or hepatosplenomegaly, Rectal; exam patient in continent of diarrheal stool which is light  brown without any evidence of blood. Stool is hemocculted and is negative for occult blood. Extremities; no clubbing cyanosis or edema skin warm and dry, Psych; mood and affect appropriate.        Assessment & Plan:  #41  80 year old male with a subtotal colectomy with segmental colitis. #2 no evidence of GI bleeding today stool Hemoccult negative #3 history of previous diverticular bleeds for which he is status post subtotal colectomy #4 history of upper GI bleed 2011 secondary to an antral ulcer #5 coronary artery disease #6 dementia mild #7 status post pacemaker for complete heart block #8 history of  prostate cancer #9 renal insufficiency #10 prior history of small bowel obstruction  Plan; Will restart him on Lomotil 1 every morning then may take up to 4 per day as needed for diarrhea Increase Lialda  to 2 tablets twice daily for total of 4.8 g daily Followup with Dr. Hilarie Fredrickson as needed.  Addendum: Reviewed and agree with current management. Jerene Bears, MD

## 2013-08-06 ENCOUNTER — Encounter: Payer: Self-pay | Admitting: Internal Medicine

## 2013-08-06 ENCOUNTER — Ambulatory Visit (INDEPENDENT_AMBULATORY_CARE_PROVIDER_SITE_OTHER): Payer: Medicare Other | Admitting: Internal Medicine

## 2013-08-06 VITALS — BP 100/52 | HR 71 | Ht 72.0 in | Wt 200.9 lb

## 2013-08-06 DIAGNOSIS — I2510 Atherosclerotic heart disease of native coronary artery without angina pectoris: Secondary | ICD-10-CM | POA: Diagnosis not present

## 2013-08-06 DIAGNOSIS — I4891 Unspecified atrial fibrillation: Secondary | ICD-10-CM | POA: Diagnosis not present

## 2013-08-06 DIAGNOSIS — I442 Atrioventricular block, complete: Secondary | ICD-10-CM

## 2013-08-06 LAB — MDC_IDC_ENUM_SESS_TYPE_INCLINIC
Battery Impedance: 5300 Ohm
Battery Voltage: 2.73 V
Implantable Pulse Generator Model: 5816
Implantable Pulse Generator Serial Number: 1522496
Lead Channel Pacing Threshold Pulse Width: 0.5 ms
Lead Channel Setting Pacing Amplitude: 2 V
Lead Channel Setting Pacing Pulse Width: 0.5 ms
Lead Channel Setting Sensing Sensitivity: 4 mV
MDC IDC MSMT LEADCHNL RA IMPEDANCE VALUE: 321 Ohm
MDC IDC MSMT LEADCHNL RA PACING THRESHOLD AMPLITUDE: 1 V
MDC IDC MSMT LEADCHNL RA PACING THRESHOLD PULSEWIDTH: 0.8 ms
MDC IDC MSMT LEADCHNL RA SENSING INTR AMPL: 2.5 mV
MDC IDC MSMT LEADCHNL RV IMPEDANCE VALUE: 373 Ohm
MDC IDC MSMT LEADCHNL RV PACING THRESHOLD AMPLITUDE: 0.625 V
MDC IDC SESS DTM: 20150306160330

## 2013-08-06 NOTE — Patient Instructions (Addendum)
Your physician recommends that you schedule a follow-up appointment in: 6 months with the device clinic and with Dr.Klein in 12 months.  Your physician has recommended you make the following change in your medication: DECREASE quinapril to 5mg  once daily. (decreased from 10mg  daily)

## 2013-08-06 NOTE — Progress Notes (Signed)
Patient Care Team: Edward Space, MD as PCP - General (Pulmonary Disease)   HPI  Edward Harvey is a 80 y.o. male ollowup for syncope with complete heart block. He  is status post pacemaker implantation. he has no symptoms of chest pain.   He gets around some with the help of his son. Balance is an issue;  shortness of breath and chest pain has not been. He is minimal edemaa    Past Medical History  Diagnosis Date  . Glaucoma   . Acute bronchitis   . Hypertension   . CAD (coronary artery disease)     nuclear stress test 2006 inferoseptal and apical infarct ejection fraction 48%  . Atrioventricular block, complete   . Diverticulosis of colon with hemorrhage   . Hemorrhage of gastrointestinal tract, unspecified   . Unspecified intestinal obstruction   . Inguinal hernia   . Gallstone   . DJD (degenerative joint disease)   . Gout   . Hematoma     subdural  . Internal hemorrhoids without mention of complication   . Pacemaker   . Prostate cancer     radiation therapy in 1996/notes 10/29/2001 (05/28/2013)  . Colitis     Past Surgical History  Procedure Laterality Date  . Pacemaker placement    . Cataract extraction, bilateral    . Subtotal colectomy  12/03    Dr. Lucia Harvey  . Lipoma excision  2001    left leg  . Burr hole for subdural hematoma      "he's had 4 ORs"/notes 10/29/2001 (05/28/2013)  . Insert / replace / remove pacemaker  1994; 11/2004    Edward Harvey 10/29/2001; generator change/notes 11/23/2004  (05/28/2013)  . Pericardiocentesis  1994    post pacemaker placement/notes 03/12/2005 (05/28/2013)  . Tonsillectomy and adenoidectomy  age 58    03/12/2005 (05/28/2013)  . Colonoscopy  2009    Diverticulosis and Hemorrhoids     Current Outpatient Prescriptions  Medication Sig Dispense Refill  . allopurinol (ZYLOPRIM) 300 MG tablet Take 300 mg by mouth daily.      Marland Kitchen aspirin 81 MG chewable tablet Chew 81 mg by mouth daily.       . brimonidine (ALPHAGAN P) 0.1 %  SOLN Place 1 drop into both eyes 3 (three) times daily.       . carvedilol (COREG) 3.125 MG tablet Take 2 tablets (6.25 mg total) by mouth 2 (two) times daily with a meal.  60 tablet  1  . Cholecalciferol (VITAMIN D) 1000 UNITS capsule Take 1,000 Units by mouth daily.        . diphenoxylate-atropine (LOMOTIL) 2.5-0.025 MG per tablet Take 1 tab every morning. And take a 2nd dose later in the day as needed for dirrhea.  60 tablet  1  . furosemide (LASIX) 20 MG tablet Take 1 tablet (20 mg total) by mouth every other day.  90 tablet  1  . hydrocortisone (ANUSOL-HC) 2.5 % rectal cream Place 1 application rectally daily as needed for hemorrhoids.      . mesalamine (LIALDA) 1.2 G EC tablet Take 2.4 g by mouth 2 (two) times daily.      . Multiple Vitamins-Minerals (CENTRUM SILVER PO) Take 1 tablet by mouth daily.        . quinapril (ACCUPRIL) 10 MG tablet Take 1 tablet (10 mg total) by mouth every evening.  30 tablet  3  . tamsulosin (FLOMAX) 0.4 MG CAPS TAKE ONE CAPSULE BY MOUTH EVERY DAY  AFTER SUPPER  90 capsule  1  . Trospium Chloride (SANCTURA XR) 60 MG CP24 Take 1 capsule by mouth daily.       No current facility-administered medications for this visit.    No Known Allergies  Review of Systems negative except from HPI and PMH  Physical Exam Pulse 71  Ht 6' (1.829 m)  Wt 200 lb 14.4 oz (91.128 kg)  BMI 27.24 kg/m2 Well developed and well nourished in no acute distress HENT normal E scleral and icterus clear Neck Supple JVP flat; carotids brisk and full Clear to ausculation  *Regular rate and rhythm, no murmurs gallops or rub Soft with active bowel sounds No clubbing cyanosis none Edema Alert and oriented, grossly normal motor and sensory function Skin Warm and Dry    Assessment and  Plan  Complete heart block  Pacemaker-St. Jude  Coronary artery disease  Edward Harvey is stable. Pacemaker function is normal. Anticipated battery is 1.5 years. he'll be seen in routine followup.

## 2013-08-09 ENCOUNTER — Ambulatory Visit: Payer: Medicare Other | Admitting: Gastroenterology

## 2013-08-11 ENCOUNTER — Telehealth: Payer: Self-pay | Admitting: Pulmonary Disease

## 2013-08-11 ENCOUNTER — Emergency Department (HOSPITAL_COMMUNITY): Payer: Medicare Other

## 2013-08-11 ENCOUNTER — Inpatient Hospital Stay (HOSPITAL_COMMUNITY)
Admission: EM | Admit: 2013-08-11 | Discharge: 2013-08-14 | DRG: 074 | Disposition: A | Discharge status: Skilled Nursing Facility | Payer: Medicare Other | Attending: Internal Medicine | Admitting: Internal Medicine

## 2013-08-11 ENCOUNTER — Encounter (HOSPITAL_COMMUNITY): Payer: Self-pay | Admitting: Emergency Medicine

## 2013-08-11 DIAGNOSIS — E860 Dehydration: Secondary | ICD-10-CM | POA: Diagnosis present

## 2013-08-11 DIAGNOSIS — I959 Hypotension, unspecified: Secondary | ICD-10-CM | POA: Diagnosis present

## 2013-08-11 DIAGNOSIS — E875 Hyperkalemia: Secondary | ICD-10-CM | POA: Diagnosis present

## 2013-08-11 DIAGNOSIS — I10 Essential (primary) hypertension: Secondary | ICD-10-CM | POA: Diagnosis present

## 2013-08-11 DIAGNOSIS — I059 Rheumatic mitral valve disease, unspecified: Secondary | ICD-10-CM | POA: Diagnosis not present

## 2013-08-11 DIAGNOSIS — K5010 Crohn's disease of large intestine without complications: Secondary | ICD-10-CM

## 2013-08-11 DIAGNOSIS — I442 Atrioventricular block, complete: Secondary | ICD-10-CM

## 2013-08-11 DIAGNOSIS — R7989 Other specified abnormal findings of blood chemistry: Secondary | ICD-10-CM | POA: Diagnosis not present

## 2013-08-11 DIAGNOSIS — I2510 Atherosclerotic heart disease of native coronary artery without angina pectoris: Secondary | ICD-10-CM

## 2013-08-11 DIAGNOSIS — R5381 Other malaise: Secondary | ICD-10-CM | POA: Diagnosis present

## 2013-08-11 DIAGNOSIS — N179 Acute kidney failure, unspecified: Secondary | ICD-10-CM

## 2013-08-11 DIAGNOSIS — I4891 Unspecified atrial fibrillation: Secondary | ICD-10-CM | POA: Diagnosis not present

## 2013-08-11 DIAGNOSIS — R197 Diarrhea, unspecified: Secondary | ICD-10-CM

## 2013-08-11 DIAGNOSIS — Z8546 Personal history of malignant neoplasm of prostate: Secondary | ICD-10-CM | POA: Diagnosis not present

## 2013-08-11 DIAGNOSIS — R609 Edema, unspecified: Secondary | ICD-10-CM | POA: Diagnosis present

## 2013-08-11 DIAGNOSIS — K5090 Crohn's disease, unspecified, without complications: Secondary | ICD-10-CM | POA: Diagnosis not present

## 2013-08-11 DIAGNOSIS — F0390 Unspecified dementia without behavioral disturbance: Secondary | ICD-10-CM

## 2013-08-11 DIAGNOSIS — M1990 Unspecified osteoarthritis, unspecified site: Secondary | ICD-10-CM

## 2013-08-11 DIAGNOSIS — R5383 Other fatigue: Secondary | ICD-10-CM | POA: Diagnosis not present

## 2013-08-11 DIAGNOSIS — I6789 Other cerebrovascular disease: Secondary | ICD-10-CM | POA: Diagnosis not present

## 2013-08-11 DIAGNOSIS — Z8679 Personal history of other diseases of the circulatory system: Secondary | ICD-10-CM

## 2013-08-11 DIAGNOSIS — Z923 Personal history of irradiation: Secondary | ICD-10-CM

## 2013-08-11 DIAGNOSIS — Z8673 Personal history of transient ischemic attack (TIA), and cerebral infarction without residual deficits: Secondary | ICD-10-CM | POA: Diagnosis not present

## 2013-08-11 DIAGNOSIS — K4090 Unilateral inguinal hernia, without obstruction or gangrene, not specified as recurrent: Secondary | ICD-10-CM

## 2013-08-11 DIAGNOSIS — K5190 Ulcerative colitis, unspecified, without complications: Secondary | ICD-10-CM | POA: Diagnosis not present

## 2013-08-11 DIAGNOSIS — K56609 Unspecified intestinal obstruction, unspecified as to partial versus complete obstruction: Secondary | ICD-10-CM

## 2013-08-11 DIAGNOSIS — Z950 Presence of cardiac pacemaker: Secondary | ICD-10-CM | POA: Diagnosis not present

## 2013-08-11 DIAGNOSIS — N19 Unspecified kidney failure: Secondary | ICD-10-CM | POA: Diagnosis not present

## 2013-08-11 DIAGNOSIS — G5630 Lesion of radial nerve, unspecified upper limb: Secondary | ICD-10-CM | POA: Diagnosis present

## 2013-08-11 DIAGNOSIS — H409 Unspecified glaucoma: Secondary | ICD-10-CM | POA: Diagnosis present

## 2013-08-11 DIAGNOSIS — R55 Syncope and collapse: Secondary | ICD-10-CM

## 2013-08-11 DIAGNOSIS — R29818 Other symptoms and signs involving the nervous system: Secondary | ICD-10-CM | POA: Diagnosis not present

## 2013-08-11 DIAGNOSIS — R269 Unspecified abnormalities of gait and mobility: Secondary | ICD-10-CM | POA: Diagnosis not present

## 2013-08-11 DIAGNOSIS — K5731 Diverticulosis of large intestine without perforation or abscess with bleeding: Secondary | ICD-10-CM

## 2013-08-11 DIAGNOSIS — M109 Gout, unspecified: Secondary | ICD-10-CM | POA: Diagnosis present

## 2013-08-11 DIAGNOSIS — M6281 Muscle weakness (generalized): Secondary | ICD-10-CM | POA: Diagnosis not present

## 2013-08-11 DIAGNOSIS — R29898 Other symptoms and signs involving the musculoskeletal system: Secondary | ICD-10-CM

## 2013-08-11 DIAGNOSIS — J209 Acute bronchitis, unspecified: Secondary | ICD-10-CM

## 2013-08-11 DIAGNOSIS — N289 Disorder of kidney and ureter, unspecified: Secondary | ICD-10-CM

## 2013-08-11 DIAGNOSIS — G459 Transient cerebral ischemic attack, unspecified: Secondary | ICD-10-CM | POA: Diagnosis not present

## 2013-08-11 DIAGNOSIS — R4182 Altered mental status, unspecified: Secondary | ICD-10-CM | POA: Diagnosis not present

## 2013-08-11 DIAGNOSIS — Z87828 Personal history of other (healed) physical injury and trauma: Secondary | ICD-10-CM

## 2013-08-11 LAB — URINALYSIS, ROUTINE W REFLEX MICROSCOPIC
Bilirubin Urine: NEGATIVE
GLUCOSE, UA: NEGATIVE mg/dL
HGB URINE DIPSTICK: NEGATIVE
Ketones, ur: NEGATIVE mg/dL
Leukocytes, UA: NEGATIVE
Nitrite: NEGATIVE
Protein, ur: NEGATIVE mg/dL
SPECIFIC GRAVITY, URINE: 1.021 (ref 1.005–1.030)
Urobilinogen, UA: 0.2 mg/dL (ref 0.0–1.0)
pH: 5 (ref 5.0–8.0)

## 2013-08-11 LAB — DIFFERENTIAL
Basophils Absolute: 0 10*3/uL (ref 0.0–0.1)
Basophils Relative: 0 % (ref 0–1)
EOS PCT: 1 % (ref 0–5)
Eosinophils Absolute: 0.1 10*3/uL (ref 0.0–0.7)
LYMPHS ABS: 1.7 10*3/uL (ref 0.7–4.0)
LYMPHS PCT: 12 % (ref 12–46)
Monocytes Absolute: 1.1 10*3/uL — ABNORMAL HIGH (ref 0.1–1.0)
Monocytes Relative: 8 % (ref 3–12)
NEUTROS PCT: 80 % — AB (ref 43–77)
Neutro Abs: 11.4 10*3/uL — ABNORMAL HIGH (ref 1.7–7.7)

## 2013-08-11 LAB — ETHANOL

## 2013-08-11 LAB — COMPREHENSIVE METABOLIC PANEL
ALBUMIN: 3 g/dL — AB (ref 3.5–5.2)
ALT: 13 U/L (ref 0–53)
AST: 15 U/L (ref 0–37)
Alkaline Phosphatase: 64 U/L (ref 39–117)
BILIRUBIN TOTAL: 0.8 mg/dL (ref 0.3–1.2)
BUN: 82 mg/dL — AB (ref 6–23)
CALCIUM: 9.5 mg/dL (ref 8.4–10.5)
CO2: 17 meq/L — AB (ref 19–32)
Chloride: 101 mEq/L (ref 96–112)
Creatinine, Ser: 2.09 mg/dL — ABNORMAL HIGH (ref 0.50–1.35)
GFR calc Af Amer: 29 mL/min — ABNORMAL LOW (ref >90)
GFR calc non Af Amer: 25 mL/min — ABNORMAL LOW (ref >90)
GLUCOSE: 99 mg/dL (ref 70–99)
POTASSIUM: 5 meq/L (ref 3.7–5.3)
Sodium: 133 mEq/L — ABNORMAL LOW (ref 137–147)
Total Protein: 7.3 g/dL (ref 6.0–8.3)

## 2013-08-11 LAB — CBC
HCT: 32 % — ABNORMAL LOW (ref 39.0–52.0)
Hemoglobin: 11.3 g/dL — ABNORMAL LOW (ref 13.0–17.0)
MCH: 33.8 pg (ref 26.0–34.0)
MCHC: 35.3 g/dL (ref 30.0–36.0)
MCV: 95.8 fL (ref 78.0–100.0)
Platelets: 185 10*3/uL (ref 150–400)
RBC: 3.34 MIL/uL — AB (ref 4.22–5.81)
RDW: 14.4 % (ref 11.5–15.5)
WBC: 14.3 10*3/uL — ABNORMAL HIGH (ref 4.0–10.5)

## 2013-08-11 LAB — PROTIME-INR
INR: 1.13 (ref 0.00–1.49)
Prothrombin Time: 14.3 seconds (ref 11.6–15.2)

## 2013-08-11 LAB — I-STAT TROPONIN, ED: Troponin i, poc: 0.04 ng/mL (ref 0.00–0.08)

## 2013-08-11 LAB — I-STAT CHEM 8, ED
BUN: 88 mg/dL — AB (ref 6–23)
CALCIUM ION: 1.25 mmol/L (ref 1.13–1.30)
CHLORIDE: 107 meq/L (ref 96–112)
Creatinine, Ser: 2.5 mg/dL — ABNORMAL HIGH (ref 0.50–1.35)
Glucose, Bld: 97 mg/dL (ref 70–99)
HCT: 39 % (ref 39.0–52.0)
Hemoglobin: 13.3 g/dL (ref 13.0–17.0)
Potassium: 6 mEq/L — ABNORMAL HIGH (ref 3.7–5.3)
SODIUM: 133 meq/L — AB (ref 137–147)
TCO2: 16 mmol/L (ref 0–100)

## 2013-08-11 LAB — PRO B NATRIURETIC PEPTIDE: PRO B NATRI PEPTIDE: 188.8 pg/mL (ref 0–450)

## 2013-08-11 LAB — POTASSIUM: POTASSIUM: 4.8 meq/L (ref 3.7–5.3)

## 2013-08-11 MED ORDER — DEXTROSE 50 % IV SOLN: 50.0000 mL | Freq: Once | INTRAVENOUS | Status: DC

## 2013-08-11 MED ORDER — INSULIN ASPART 100 UNIT/ML IV SOLN
8.0000 [IU] | Freq: Once | INTRAVENOUS | Status: DC
  Filled 2013-08-11: qty 0.08

## 2013-08-11 MED ORDER — ASPIRIN 81 MG PO CHEW
324.0000 mg | CHEWABLE_TABLET | Freq: Once | ORAL | Status: AC
Start: 2013-08-11 — End: 2013-08-11
  Administered 2013-08-11: 324 mg via ORAL
  Filled 2013-08-11: qty 4

## 2013-08-11 MED ORDER — ALBUTEROL SULFATE (2.5 MG/3ML) 0.083% IN NEBU: 2.5000 mg | INHALATION_SOLUTION | Freq: Once | RESPIRATORY_TRACT | Status: DC

## 2013-08-11 MED ORDER — SODIUM CHLORIDE 0.9 % IV BOLUS (SEPSIS)
1000.0000 mL | Freq: Once | INTRAVENOUS | Status: AC
  Administered 2013-08-11: 1000 mL via INTRAVENOUS

## 2013-08-11 MED ORDER — SODIUM CHLORIDE 0.9 % IV SOLN
1.0000 g | Freq: Once | INTRAVENOUS | Status: DC
  Filled 2013-08-11: qty 10

## 2013-08-11 NOTE — Telephone Encounter (Signed)
If wrist swollen / tender ? Gout flare or OA  Can place ice /heat on wrist .  If more confusion and ext weakness may need ER anyway due to high risk of decompensation with hx subdural  Do not think it is from coreg.  B/p was good.  If not improving will need ov  Or ER  Please contact office for sooner follow up if symptoms do not improve or worsen or seek emergency care

## 2013-08-11 NOTE — H&P (Signed)
[facility]                                                                                    Patient Demographics  Edward Harvey, is a 80 y.o. male  CSN: [id number]  MRN: [id number]  DOB - [birth year]  Admit Date - 08/11/2013  Outpatient Primary MD for the patient is Noralee Space, MD   With History of -  Past Medical History  Diagnosis Date  . Glaucoma   . Acute bronchitis   . Hypertension   . CAD (coronary artery disease)     nuclear stress test 2006 inferoseptal and apical infarct ejection fraction 48%  . Atrioventricular block, complete   . Diverticulosis of colon with hemorrhage   . Hemorrhage of gastrointestinal tract, unspecified   . Unspecified intestinal obstruction   . Inguinal hernia   . Gallstone   . DJD (degenerative joint disease)   . Gout   . Hematoma     subdural  . Internal hemorrhoids without mention of complication   . Pacemaker   . Prostate cancer     radiation therapy in 1996/notes 10/29/2001 (05/28/2013)  . Colitis       Past Surgical History  Procedure Laterality Date  . Pacemaker placement    . Cataract extraction, bilateral    . Subtotal colectomy  12/03    Dr. Lucia Gaskins  . Lipoma excision  2001    left leg  . Burr hole for subdural hematoma      "he's had 4 ORs"/notes 10/29/2001 (05/28/2013)  . Insert / replace / remove pacemaker  1994; 11/2004    Archie Endo 10/29/2001; generator change/notes 11/23/2004  (05/28/2013)  . Pericardiocentesis  1994    post pacemaker placement/notes 03/12/2005 (05/28/2013)  . Tonsillectomy and adenoidectomy  age 58    03/12/2005 (05/28/2013)  . Colonoscopy  2009    Diverticulosis and Hemorrhoids     in for   Chief Complaint  Patient presents with  . Stroke Symptoms     HPI  Edward Harvey  is a 80 y.o. male,  With past medical history significant for diverticulosis and history of intestinal obstruction, hemorrhage and history of coronary artery disease brought today because he couldn't  dorsiflex his left hand. His son noticed that and he reports that he has been having diarrhea for the last 2 weeks. He was started on Lomotil by his primary medical doctor 3 days ago and his diarrhea stopped yesterday. Last night he slept in the chair and woke up today with lower extremity edema and inability to dorsiflex his left arm and hand. CT of the head was negative for acute events. The patient was also noted to be in acute renal failure and was on  quinapril and Lasix. According to the son this might have been stopped a few days ago and his Coreg has been increased.    Review of Systems      Social History History  Substance Use Topics  . Smoking status: Never Smoker   . Smokeless tobacco: Never Used  . Alcohol Use: No     Comment: Occ wine      Family History No family history  on file.   Prior to Admission medications   Medication Sig Start Date End Date Taking? Authorizing Provider  allopurinol (ZYLOPRIM) 300 MG tablet Take 300 mg by mouth daily.    Historical Provider, MD  aspirin 81 MG chewable tablet Chew 81 mg by mouth daily.     Historical Provider, MD  brimonidine (ALPHAGAN P) 0.1 % SOLN Place 1 drop into both eyes 3 (three) times daily.     Historical Provider, MD  carvedilol (COREG) 3.125 MG tablet Take 2 tablets (6.25 mg total) by mouth 2 (two) times daily with a meal. 07/23/13   Noralee Space, MD  Cholecalciferol (VITAMIN D) 1000 UNITS capsule Take 1,000 Units by mouth daily.      Historical Provider, MD  diphenoxylate-atropine (LOMOTIL) 2.5-0.025 MG per tablet Take 1 tab every morning. And take a 2nd dose later in the day as needed for dirrhea. 08/04/13   Amy S Esterwood, PA-C  furosemide (LASIX) 20 MG tablet Take 1 tablet (20 mg total) by mouth every other day. 05/29/13   Zerita Boers, MD  hydrocortisone (ANUSOL-HC) 2.5 % rectal cream Place 1 application rectally daily as needed for hemorrhoids. 07/24/12   Sable Feil, MD  mesalamine (LIALDA) 1.2 G  EC tablet Take 2.4 g by mouth 2 (two) times daily. 08/04/13   Amy S Esterwood, PA-C  Multiple Vitamins-Minerals (CENTRUM SILVER PO) Take 1 tablet by mouth daily.      Historical Provider, MD  quinapril (ACCUPRIL) 10 MG tablet Take 5 mg by mouth every evening. 07/06/13   Deboraha Sprang, MD  tamsulosin (FLOMAX) 0.4 MG CAPS TAKE ONE CAPSULE BY MOUTH EVERY DAY AFTER SUPPER 12/29/12   Noralee Space, MD  Trospium Chloride (SANCTURA XR) 60 MG CP24 Take 1 capsule by mouth daily.    Historical Provider, MD    No Known Allergies  Physical Exam  Vitals  Blood pressure 124/65, pulse 86, temperature 98.2 F (36.8 C), temperature source Oral, resp. rate 18, SpO2 96.00%.   1. General elderly African American male lying in bed, pleasantly confused  2. Normal affect and insight, Not Suicidal or Homicidal,.  3. No F.N deficits, ALL C.Nerves Intact, Strength 5/5 all 4 extremities, Sensation intact all 4 extremities, Plantars down going.  4. Ears and Eyes appear Normal, Conjunctivae clear, dry Oral Mucosa.  5. Supple Neck, No JVD, No cervical lymphadenopathy appriciated, No Carotid Bruits.  6. Symmetrical Chest Bauch movement, Good air movement bilaterally, CTAB.  7. RRR, No Gallops, Rubs or Murmurs, No Parasternal Heave.  8. Positive Bowel Sounds, Abdomen Soft, Non tender, No organomegaly appriciated,No rebound -guarding or rigidity.  9.  No Cyanosis, Normal Skin Turgor, bilateral +2 lower extremity edema  10. Left hand and arm weaker dorsiflexion and pronation weakness noted with left hand drop  11. No Palpable Lymph Nodes in Neck or Axillae    Data Review  CBC  Recent Labs Lab 08/11/13 1821 08/11/13 1915  WBC  --  14.3*  HGB 13.3 11.3*  HCT 39.0 32.0*  PLT  --  185  MCV  --  95.8  MCH  --  33.8  MCHC  --  35.3  RDW  --  14.4  LYMPHSABS  --  1.7  MONOABS  --  1.1*  EOSABS  --  0.1  BASOSABS  --  0.0    ------------------------------------------------------------------------------------------------------------------  Chemistries   Recent Labs Lab 08/11/13 1821 08/11/13 1915  NA 133* 133*  K 6.0* 5.0  CL 107  101  CO2  --  17*  GLUCOSE 97 99  BUN 88* 82*  CREATININE 2.50* 2.09*  CALCIUM  --  9.5  AST  --  15  ALT  --  13  ALKPHOS  --  64  BILITOT  --  0.8   ------------------------------------------------------------------------------------------------------------------ CrCl is unknown because both a height and weight (above a minimum accepted value) are required for this calculation. ------------------------------------------------------------------------------------------------------------------ No results found for this basename: TSH, T4TOTAL, FREET3, T3FREE, THYROIDAB,  in the last 72 hours   Coagulation profile  Recent Labs Lab 08/11/13 1915  INR 1.13   ------------------------------------------------------------------------------------------------------------------- No results found for this basename: DDIMER,  in the last 72 hours -------------------------------------------------------------------------------------------------------------------  Cardiac Enzymes No results found for this basename: CK, CKMB, TROPONINI, MYOGLOBIN,  in the last 168 hours ------------------------------------------------------------------------------------------------------------------ No components found with this basename: POCBNP,      ----------------------------------------------------------------------------------------------------------------  ABG     Imaging results:   Dg Chest 2 View  08/11/2013   CLINICAL DATA Left arm weakness.  EXAM CHEST  2 VIEW  COMPARISON May 26, 2013.  FINDINGS Stable cardiomediastinal silhouette. Right-sided pacemaker is unchanged in position. Stable central pulmonary vascular congestion is noted. No acute pulmonary disease is noted. No  pneumothorax or pleural effusion is noted. Degenerative changes are noted in the thoracic spine.  IMPRESSION No acute cardiopulmonary abnormality seen.  SIGNATURE  Electronically Signed   By: Sabino Dick M.D.   On: 08/11/2013 21:42   Ct Head Wo Contrast  08/11/2013   CLINICAL DATA Weakness with left hand grip on physical examination. Acute mental status changes earlier today, patient now back to baseline.  EXAM CT HEAD WITHOUT CONTRAST  TECHNIQUE Contiguous axial images were obtained from the base of the skull through the vertex without intravenous contrast.  COMPARISON CT HEAD W/O CM dated 05/26/2013  FINDINGS Severe cortical, deep, and cerebellar atrophy, unchanged. Mild changes of small vessel disease of the white matter, unchanged. No mass lesion. No midline shift. No acute hemorrhage or hematoma. No extra-axial fluid collections. No evidence of acute infarction. No significant interval change.  Prior bilateral frontal burr holes. No focal osseous abnormalities elsewhere involving the skull. Opacification of the right maxillary sinus with possible erosion of the medial Yochim and protrusion of sinus contents into the nasal cavity, unchanged. Remaining visualized paranasal sinuses, bilateral mastoid air cells, and bilateral middle ear cavities well-aerated.  IMPRESSION 1. No acute intracranial abnormality. 2. Stable severe generalized atrophy and mild chronic microvascular ischemic changes of the white matter. 3. Stable chronic right maxillary sinusitis. There may be erosion of the medial sinus [place] with protrusion of sinus contents into the nasal cavity, query mucocele.  SIGNATURE  Electronically Signed   By: Evangeline Dakin M.D.   On: 08/11/2013 18:48    My personal review of EKG: Paced rhythm    Assessment & Plan  1. Acute renal failure    Probably due a combination of diarrhea, ACE inhibitor and Lasix    slow hydration with IV fluids normal saline 2. Hyperkalemia    Start Kayexalate , check  potassium in a.m. 3. lower extremity edema     BNP 221/Check lower extremity Dopplers/Check echocardiogram 4. Left arm weakness     Check EMG in a.m./neurology consult?  radial nerve palsy   DVT Prophylaxis Heparin   AM Labs Ordered, also please review Full Orders  Family Communication: Admission, patients condition and plan of care including tests being ordered have been discussed with the patient and  son  who indicate understanding and agree with the plan and Code Status.  Code Status  full   Disposition Plan:  home with family   Time spent in minutes :  37 minutes   Condition GUARDED   @SIGNATURE @

## 2013-08-11 NOTE — ED Notes (Signed)
Lab called to add on BNP 

## 2013-08-11 NOTE — Telephone Encounter (Signed)
Spoke with pt's son - He went to see pt this am and pt c/o diff moving left wrist up and down and also numbness in left hand.  Has noticed increased confusion for past week.  They called EMS and they checked him and didn't think he was stroke symptoms (B/P was 110/68)  Son wants advice on what to do and wanders if this could be coming from dose of Coreg being increased to 6.25mg  bid.  I could not verify in epic when this had been changed.  Please advise.

## 2013-08-11 NOTE — Consult Note (Signed)
NEURO HOSPITALIST CONSULT NOTE    Reason for Consult: left hand weakness.  HPI:                                                                                                                                          Edward Harvey is an 80 y.o. male with a past medical history significant for HTN, CAD, AV block s/p pacemaker placement, s/p bilateral SDH removal, prostate cancer, GI bleeding, DJD, brought to [facility] ED by family due to new onset left hand weakness. Family is not available at this time but he said that this morning he woke up and noted that he couldn't use his left hand as he normally does. Denies pain, numbness left upper extremity. No HA, vertigo, double vision, difficulty swallowing, slurred speech, language or vision impairment. CT brain showed no acute abnormality.   Past Medical History  Diagnosis Date  . Glaucoma   . Acute bronchitis   . Hypertension   . CAD (coronary artery disease)     nuclear stress test 2006 inferoseptal and apical infarct ejection fraction 48%  . Atrioventricular block, complete   . Diverticulosis of colon with hemorrhage   . Hemorrhage of gastrointestinal tract, unspecified   . Unspecified intestinal obstruction   . Inguinal hernia   . Gallstone   . DJD (degenerative joint disease)   . Gout   . Hematoma     subdural  . Internal hemorrhoids without mention of complication   . Pacemaker   . Prostate cancer     radiation therapy in 1996/notes 10/29/2001 (05/28/2013)  . Colitis     Past Surgical History  Procedure Laterality Date  . Pacemaker placement    . Cataract extraction, bilateral    . Subtotal colectomy  12/03    Dr. Lucia Gaskins  . Lipoma excision  2001    left leg  . Burr hole for subdural hematoma      "he's had 4 ORs"/notes 10/29/2001 (05/28/2013)  . Insert / replace / remove pacemaker  1994; 11/2004    Archie Endo 10/29/2001; generator change/notes 11/23/2004  (05/28/2013)  . Pericardiocentesis  1994    post  pacemaker placement/notes 03/12/2005 (05/28/2013)  . Tonsillectomy and adenoidectomy  age 37    03/12/2005 (05/28/2013)  . Colonoscopy  2009    Diverticulosis and Hemorrhoids     No family history on file.   Social History:  reports that he has never smoked. He has never used smokeless tobacco. He reports that he does not drink alcohol or use illicit drugs.  No Known Allergies  MEDICATIONS:  I have reviewed the patient's current medications.   ROS:                                                                                                                                       History obtained from the patient and chart review.  General ROS: negative for - chills, fatigue, fever, night sweats, weight gain or weight loss Psychological ROS: negative for - behavioral disorder, hallucinations, mood swings or suicidal ideation Ophthalmic ROS: negative for - blurry vision, double vision, eye pain or loss of vision ENT ROS: negative for - epistaxis, nasal discharge, oral lesions, sore throat, tinnitus or vertigo Allergy and Immunology ROS: negative for - hives or itchy/watery eyes Hematological and Lymphatic ROS: negative for - bleeding problems, bruising or swollen lymph nodes Endocrine ROS: negative for - galactorrhea, hair pattern changes, polydipsia/polyuria or temperature intolerance Respiratory ROS: negative for - cough, hemoptysis, shortness of breath or wheezing Cardiovascular ROS: negative for - chest pain, dyspnea on exertion, edema or irregular heartbeat Gastrointestinal ROS: negative for - abdominal pain, diarrhea, hematemesis, nausea/vomiting or stool incontinence Genito-Urinary ROS: negative for - dysuria, hematuria Musculoskeletal ROS: negative for - joint swelling Neurological ROS: as noted in HPI Dermatological ROS: negative for rash and skin lesion  changes  Physical exam: pleasant male in no apparent distress. Blood pressure 102/64, pulse 85, temperature 98.2 F (36.8 C), temperature source Oral, resp. rate 18, SpO2 95.00%. Head: normocephalic. Neck: supple, no bruits, no JVD. Cardiac: no murmurs. Lungs: clear. Abdomen: soft, no tender, no mass. Extremities: mild pitting LE edema. CV: pulses palpable throughout  Neurologic Examination:                                                                                                      Mental Status: Alert, oriented, thought content appropriate.  Speech fluent without evidence of aphasia.  Able to follow 3 step commands without difficulty. Cranial Nerves: II: Discs flat bilaterally; Visual fields grossly normal, pupils equal, round, reactive to light and accommodation III,IV, VI: mild left eyelid ptosis, extra-ocular motions intact bilaterally V,VII: smile symmetric, facial light touch sensation normal bilaterally VIII: hearing normal bilaterally IX,X: gag reflex present XI: bilateral shoulder shrug XII: midline tongue extension without atrophy or fasciculations  Motor: Significant for weakness left wrist extension. Sensory: Pinprick and light touch intact throughout, bilaterally Deep Tendon Reflexes:  1+ all over Plantars: Right: downgoing   Left: downgoing Cerebellar: No tested. Gait:  No tested.    Lab Results  Component Value Date/Time   CHOL  Value: 74        ATP III CLASSIFICATION:  <200     mg/dL   Desirable  200-239  mg/dL   Borderline High  >=240    mg/dL   High        07/23/2009  1:15 AM    Results for orders placed during the hospital encounter of 08/11/13 (from the past 48 hour(s))  I-STAT TROPOININ, ED     Status: None   Collection Time    08/11/13  6:19 PM      Result Value Ref Range   Troponin i, poc 0.04  0.00 - 0.08 ng/mL   Comment 3            Comment: Due to the release kinetics of cTnI,     a negative result within the first hours     of the  onset of symptoms does not rule out     myocardial infarction with certainty.     If myocardial infarction is still suspected,     repeat the test at appropriate intervals.  I-STAT CHEM 8, ED     Status: Abnormal   Collection Time    08/11/13  6:21 PM      Result Value Ref Range   Sodium 133 (*) 137 - 147 mEq/L   Potassium 6.0 (*) 3.7 - 5.3 mEq/L   Chloride 107  96 - 112 mEq/L   BUN 88 (*) 6 - 23 mg/dL   Creatinine, Ser 2.50 (*) 0.50 - 1.35 mg/dL   Glucose, Bld 97  70 - 99 mg/dL   Calcium, Ion 1.25  1.13 - 1.30 mmol/L   TCO2 16  0 - 100 mmol/L   Hemoglobin 13.3  13.0 - 17.0 g/dL   HCT 39.0  39.0 - 52.0 %  ETHANOL     Status: None   Collection Time    08/11/13  7:15 PM      Result Value Ref Range   Alcohol, Ethyl (B) <11  0 - 11 mg/dL   Comment:            LOWEST DETECTABLE LIMIT FOR     SERUM ALCOHOL IS 11 mg/dL     FOR MEDICAL PURPOSES ONLY  PROTIME-INR     Status: None   Collection Time    08/11/13  7:15 PM      Result Value Ref Range   Prothrombin Time 14.3  11.6 - 15.2 seconds   INR 1.13  0.00 - 1.49  CBC     Status: Abnormal   Collection Time    08/11/13  7:15 PM      Result Value Ref Range   WBC 14.3 (*) 4.0 - 10.5 K/uL   RBC 3.34 (*) 4.22 - 5.81 MIL/uL   Hemoglobin 11.3 (*) 13.0 - 17.0 g/dL   HCT 32.0 (*) 39.0 - 52.0 %   MCV 95.8  78.0 - 100.0 fL   MCH 33.8  26.0 - 34.0 pg   MCHC 35.3  30.0 - 36.0 g/dL   RDW 14.4  11.5 - 15.5 %   Platelets 185  150 - 400 K/uL  DIFFERENTIAL     Status: Abnormal   Collection Time    08/11/13  7:15 PM      Result Value Ref Range   Neutrophils Relative % 80 (*) 43 - 77 %   Neutro Abs 11.4 (*) 1.7 - 7.7 K/uL   Lymphocytes Relative 12  12 - 46 %   Lymphs Abs 1.7  0.7 - 4.0 K/uL   Monocytes Relative 8  3 - 12 %   Monocytes Absolute 1.1 (*) 0.1 - 1.0 K/uL   Eosinophils Relative 1  0 - 5 %   Eosinophils Absolute 0.1  0.0 - 0.7 K/uL   Basophils Relative 0  0 - 1 %   Basophils Absolute 0.0  0.0 - 0.1 K/uL  COMPREHENSIVE  METABOLIC PANEL     Status: Abnormal   Collection Time    08/11/13  7:15 PM      Result Value Ref Range   Sodium 133 (*) 137 - 147 mEq/L   Potassium 5.0  3.7 - 5.3 mEq/L   Chloride 101  96 - 112 mEq/L   CO2 17 (*) 19 - 32 mEq/L   Glucose, Bld 99  70 - 99 mg/dL   BUN 82 (*) 6 - 23 mg/dL   Creatinine, Ser 2.09 (*) 0.50 - 1.35 mg/dL   Calcium 9.5  8.4 - 10.5 mg/dL   Total Protein 7.3  6.0 - 8.3 g/dL   Albumin 3.0 (*) 3.5 - 5.2 g/dL   AST 15  0 - 37 U/L   ALT 13  0 - 53 U/L   Alkaline Phosphatase 64  39 - 117 U/L   Total Bilirubin 0.8  0.3 - 1.2 mg/dL   GFR calc non Af Amer 25 (*) >90 mL/min   GFR calc Af Amer 29 (*) >90 mL/min   Comment: (NOTE)     The eGFR has been calculated using the CKD EPI equation.     This calculation has not been validated in all clinical situations.     eGFR's persistently <90 mL/min signify possible Chronic Kidney     Disease.  PRO B NATRIURETIC PEPTIDE     Status: None   Collection Time    08/11/13  7:15 PM      Result Value Ref Range   Pro B Natriuretic peptide (BNP) 188.8  0 - 450 pg/mL  POTASSIUM     Status: None   Collection Time    08/11/13 10:04 PM      Result Value Ref Range   Potassium 4.8  3.7 - 5.3 mEq/L  URINALYSIS, ROUTINE W REFLEX MICROSCOPIC     Status: Abnormal   Collection Time    08/11/13 10:15 PM      Result Value Ref Range   Color, Urine YELLOW  YELLOW   APPearance HAZY (*) CLEAR   Specific Gravity, Urine 1.021  1.005 - 1.030   pH 5.0  5.0 - 8.0   Glucose, UA NEGATIVE  NEGATIVE mg/dL   Hgb urine dipstick NEGATIVE  NEGATIVE   Bilirubin Urine NEGATIVE  NEGATIVE   Ketones, ur NEGATIVE  NEGATIVE mg/dL   Protein, ur NEGATIVE  NEGATIVE mg/dL   Urobilinogen, UA 0.2  0.0 - 1.0 mg/dL   Nitrite NEGATIVE  NEGATIVE   Leukocytes, UA NEGATIVE  NEGATIVE   Comment: MICROSCOPIC NOT DONE ON URINES WITH NEGATIVE PROTEIN, BLOOD, LEUKOCYTES, NITRITE, OR GLUCOSE <1000 mg/dL.    Dg Chest 2 View  08/11/2013   CLINICAL DATA Left arm weakness.   EXAM CHEST  2 VIEW  COMPARISON May 26, 2013.  FINDINGS Stable cardiomediastinal silhouette. Right-sided pacemaker is unchanged in position. Stable central pulmonary vascular congestion is noted. No acute pulmonary disease is noted. No pneumothorax or pleural effusion is noted. Degenerative changes are noted in the thoracic spine.  IMPRESSION No acute cardiopulmonary  abnormality seen.  SIGNATURE  Electronically Signed   By: Sabino Dick M.D.   On: 08/11/2013 21:42   Ct Head Wo Contrast  08/11/2013   CLINICAL DATA Weakness with left hand grip on physical examination. Acute mental status changes earlier today, patient now back to baseline.  EXAM CT HEAD WITHOUT CONTRAST  TECHNIQUE Contiguous axial images were obtained from the base of the skull through the vertex without intravenous contrast.  COMPARISON CT HEAD W/O CM dated 05/26/2013  FINDINGS Severe cortical, deep, and cerebellar atrophy, unchanged. Mild changes of small vessel disease of the white matter, unchanged. No mass lesion. No midline shift. No acute hemorrhage or hematoma. No extra-axial fluid collections. No evidence of acute infarction. No significant interval change.  Prior bilateral frontal burr holes. No focal osseous abnormalities elsewhere involving the skull. Opacification of the right maxillary sinus with possible erosion of the medial Evitts and protrusion of sinus contents into the nasal cavity, unchanged. Remaining visualized paranasal sinuses, bilateral mastoid air cells, and bilateral middle ear cavities well-aerated.  IMPRESSION 1. No acute intracranial abnormality. 2. Stable severe generalized atrophy and mild chronic microvascular ischemic changes of the white matter. 3. Stable chronic right maxillary sinusitis. There may be erosion of the medial sinus Mollenhauer with protrusion of sinus contents into the nasal cavity, query mucocele.  SIGNATURE  Electronically Signed   By: Evangeline Dakin M.D.   On: 08/11/2013 18:48    Assessment/Plan: 80 y/o with probable left radial nerve palsy. Although a small right cortical infarct can sometimes present with isolated wrist weakness, this is considered to be less likely. He can not have MRI due to pacemaker and electrophysiological testing is not available in the hospital. Can consider follow up CT brain in 24 hours or NCS/EMG as outpatient. Continue aspirin. Will follow up  Dorian Pod, MD 08/11/2013, 11:29 PM

## 2013-08-11 NOTE — ED Provider Notes (Addendum)
CSN: [id number]     Arrival date & time 08/11/13  1732 History   First MD Initiated Contact with Patient 08/11/13 1739     Chief Complaint  Patient presents with  . Stroke Symptoms     (Consider location/radiation/quality/duration/timing/severity/associated sxs/prior Treatment) HPI Comments: 80 yo male with HTN, CAD, SBO, Subdural hx, AV block, a fib, pacemaker presents with left hand weakness since 1030 am this am.  Pt had no other sxs so family decided to watch however sxs did not improved and noticed mild worsening bilateral foot swelling.  Nothing improves weakness, pt cannot extend left wrist, pt does not recall leaning on anything, no hx of nerve palsy.  No other stroke sxs.   The history is provided by the patient and a relative.    Past Medical History  Diagnosis Date  . Glaucoma   . Acute bronchitis   . Hypertension   . CAD (coronary artery disease)     nuclear stress test 2006 inferoseptal and apical infarct ejection fraction 48%  . Atrioventricular block, complete   . Diverticulosis of colon with hemorrhage   . Hemorrhage of gastrointestinal tract, unspecified   . Unspecified intestinal obstruction   . Inguinal hernia   . Gallstone   . DJD (degenerative joint disease)   . Gout   . Hematoma     subdural  . Internal hemorrhoids without mention of complication   . Pacemaker   . Prostate cancer     radiation therapy in 1996/notes 10/29/2001 (05/28/2013)  . Colitis    Past Surgical History  Procedure Laterality Date  . Pacemaker placement    . Cataract extraction, bilateral    . Subtotal colectomy  12/03    Dr. Lucia Gaskins  . Lipoma excision  2001    left leg  . Burr hole for subdural hematoma      "he's had 4 ORs"/notes 10/29/2001 (05/28/2013)  . Insert / replace / remove pacemaker  1994; 11/2004    Archie Endo 10/29/2001; generator change/notes 11/23/2004  (05/28/2013)  . Pericardiocentesis  1994    post pacemaker placement/notes 03/12/2005 (05/28/2013)  . Tonsillectomy  and adenoidectomy  age 52    03/12/2005 (05/28/2013)  . Colonoscopy  2009    Diverticulosis and Hemorrhoids    No family history on file. History  Substance Use Topics  . Smoking status: Never Smoker   . Smokeless tobacco: Never Used  . Alcohol Use: No     Comment: Occ wine     Review of Systems  Constitutional: Negative for fever and chills.  HENT: Negative for congestion.   Eyes: Positive for visual disturbance (chronic left vision loss glaucoma).  Respiratory: Negative for shortness of breath.   Cardiovascular: Negative for chest pain.  Gastrointestinal: Negative for vomiting and abdominal pain.  Genitourinary: Negative for dysuria and flank pain.  Musculoskeletal: Negative for back pain, neck pain and neck stiffness.  Skin: Negative for rash.  Neurological: Positive for weakness. Negative for light-headedness and headaches.      Allergies  Review of patient's allergies indicates no known allergies.  Home Medications   Current Outpatient Rx  Name  Route  Sig  Dispense  Refill  . allopurinol (ZYLOPRIM) 300 MG tablet   Oral   Take 300 mg by mouth daily.         Marland Kitchen aspirin 81 MG chewable tablet   Oral   Chew 81 mg by mouth daily.          . brimonidine (ALPHAGAN P) 0.1 %  SOLN   Both Eyes   Place 1 drop into both eyes 3 (three) times daily.          . carvedilol (COREG) 3.125 MG tablet   Oral   Take 2 tablets (6.25 mg total) by mouth 2 (two) times daily with a meal.   60 tablet   1   . Cholecalciferol (VITAMIN D) 1000 UNITS capsule   Oral   Take 1,000 Units by mouth daily.           . diphenoxylate-atropine (LOMOTIL) 2.5-0.025 MG per tablet      Take 1 tab every morning. And take a 2nd dose later in the day as needed for dirrhea.   60 tablet   1   . furosemide (LASIX) 20 MG tablet   Oral   Take 1 tablet (20 mg total) by mouth every other day.   90 tablet   1   . hydrocortisone (ANUSOL-HC) 2.5 % rectal cream   Rectal   Place 1 application  rectally daily as needed for hemorrhoids.         . mesalamine (LIALDA) 1.2 G EC tablet   Oral   Take 2.4 g by mouth 2 (two) times daily.         . Multiple Vitamins-Minerals (CENTRUM SILVER PO)   Oral   Take 1 tablet by mouth daily.           . quinapril (ACCUPRIL) 10 MG tablet   Oral   Take 5 mg by mouth every evening.         . tamsulosin (FLOMAX) 0.4 MG CAPS      TAKE ONE CAPSULE BY MOUTH EVERY DAY AFTER SUPPER   90 capsule   1   . Trospium Chloride (SANCTURA XR) 60 MG CP24   Oral   Take 1 capsule by mouth daily.          BP 99/63  Pulse 86  Temp(Src) 98.8 F (37.1 C) (Oral)  Resp 20  SpO2 94% Physical Exam  Nursing note and vitals reviewed. Constitutional: He is oriented to person, place, and time. He appears well-developed and well-nourished.  HENT:  Head: Normocephalic and atraumatic.  Eyes: Conjunctivae are normal. Right eye exhibits no discharge. Left eye exhibits no discharge.  Neck: Normal range of motion. Neck supple. No tracheal deviation present.  Cardiovascular: Normal rate and regular rhythm.   Pulmonary/Chest: Effort normal and breath sounds normal.  Abdominal: Soft. He exhibits no distension. There is no tenderness. There is no guarding.  Musculoskeletal: He exhibits edema (2+ swelling bilateral ankles and feet).  Neurological: He is alert and oriented to person, place, and time. GCS eye subscore is 4. GCS verbal subscore is 5. GCS motor subscore is 6.  decr vision left eye chronic   5+ strength in LE with f/e at major joints. Pt unable to dorsiflex left wrist Sensation to palpation intact in UE and LE. CNs 2-12 grossly intact.  EOMFI.  PERRL.   Finger nose and coordination intact bilateral.   Visual fields intact to finger testing. No drift or droop   Skin: Skin is warm. No rash noted.  Psychiatric: He has a normal mood and affect.    ED Course  Procedures (including critical care time) Emergency Ultrasound Study:   Angiocath  insertion Performed by: Mariea Clonts  Consent: Verbal consent obtained. Risks and benefits: risks, benefits and alternatives were discussed Immediately prior to procedure the correct patient, procedure, equipment, support staff and site/side marked  as needed.  Indication: difficult IV access Preparation: Patient was prepped and draped in the usual sterile fashion. Vein Location: right arm vein was visualized during assessment for potential access sites and was found to be patent/ easily compressed with linear ultrasound.  The needle was visualized with real-time ultrasound and guided into the vein. Gauge: 20 g  Image saved and stored.  Normal blood return.  Patient tolerance: Patient tolerated the procedure well with no immediate complications.     Labs Review Labs Reviewed  CBC - Abnormal; Notable for the following:    WBC 14.3 (*)    RBC 3.34 (*)    Hemoglobin 11.3 (*)    HCT 32.0 (*)    All other components within normal limits  DIFFERENTIAL - Abnormal; Notable for the following:    Neutrophils Relative % 80 (*)    Neutro Abs 11.4 (*)    Monocytes Absolute 1.1 (*)    All other components within normal limits  I-STAT CHEM 8, ED - Abnormal; Notable for the following:    Sodium 133 (*)    Potassium 6.0 (*)    BUN 88 (*)    Creatinine, Ser 2.50 (*)    All other components within normal limits  PROTIME-INR  ETHANOL  COMPREHENSIVE METABOLIC PANEL  URINALYSIS, ROUTINE W REFLEX MICROSCOPIC  I-STAT TROPOININ, ED  I-STAT TROPOININ, ED   Imaging Review Ct Head Wo Contrast  08/13/2013   CLINICAL DATA:  Improving left arm weakness with wrist drop, likely radial nerve palsy. Stroke risk factors include atrial fibrillation, hypertension, and coronary artery disease.  EXAM: CT HEAD WITHOUT CONTRAST  TECHNIQUE: Contiguous axial images were obtained from the base of the skull through the vertex without contrast.  COMPARISON:  CT head 08/11/2013 and 05/26/2013.  FINDINGS: Advanced  atrophy with chronic microvascular ischemic change. Bilateral burr holes from chronic subdural evacuation. No acute stroke or hemorrhage. No mass lesion or hydrocephalus. Slight prominence of the extracerebral CSF spaces felt to represent atrophy rather than hygromas. Hyperostosis. Bilateral cataract extraction. No sinus or mastoid disease.  IMPRESSION: No evolving right hemisphere infarct to explain left arm weakness. No intracranial hemorrhage. Chronic changes as described.   Electronically Signed   By: Davonna Belling M.D.   On: 08/13/2013 08:36     EKG Interpretation   Date/Time:  Wednesday August 11 2013 17:45:26 EDT Ventricular Rate:  88 PR Interval:  213 QRS Duration: 204 QT Interval:  455 QTC Calculation: 551 R Axis:   -85 Text Interpretation:  Unknown rhythm, irregular rate Borderline prolonged  PR interval IVCD, consider atypical RBBB Left ventricular hypertrophy  Confirmed by Mavric Cortright  MD, [name] Lamay ([id number]) on 08/11/2013 7:36:10 PM      MDM   Final diagnoses:  ARF (acute renal failure)  Dehydration  Left hand weakness  Radial nerve palsy  Concern for stroke vs bleed vs radiculopathy/ palsy. Pt out of stroke tpa window, well appearing. Labs and neuro consult, repaged neuro. Repeat labs showed normal K.  Repaged neuro and [facility] for admission. Spoke with both, agree with plan.  ARF.  Fluid bolus given.   HyperK, paced rhythm difficult to assess changes on ekg.  Calcium, fluids, albuterol, insulin/ glu. UA pending.  The patients results and plan were reviewed and discussed.   Any x-rays performed were personally reviewed by myself.   Differential diagnosis were considered with the presenting HPI.  EKG: reviewed  Admission/ observation were discussed with the admitting physician, patient and/or family and they are comfortable with the  plan.     Mariea Clonts, MD 08/14/13 [id number]  Mariea Clonts, MD 08/26/13 [phone]

## 2013-08-11 NOTE — ED Notes (Signed)
Patient transported to CT 

## 2013-08-11 NOTE — ED Notes (Signed)
80 yo old male presents with pronoucced left sided hand weakness. Son noticed pt was speaking off the [place] but is now returning to baseline around 1030am this morning. No facial drop, nor LOC by [facility]. Per [facility] pt was ambulating through the house w/o difficulty. Left hand grip is weak, no confusion. HX. PPM, HTN. EKG ppm present.    VITALS 102/62 Hr 86 CBG 126

## 2013-08-11 NOTE — Telephone Encounter (Signed)
Spoke with pt's son and given TP's recommendations.  He requests pt be seen in our office for eval.  Appt with TP 08/12/13 at 10:45

## 2013-08-12 ENCOUNTER — Encounter (HOSPITAL_COMMUNITY): Payer: Self-pay | Admitting: *Deleted

## 2013-08-12 ENCOUNTER — Ambulatory Visit: Payer: Medicare Other | Admitting: Adult Health

## 2013-08-12 DIAGNOSIS — N179 Acute kidney failure, unspecified: Secondary | ICD-10-CM | POA: Diagnosis not present

## 2013-08-12 DIAGNOSIS — R29898 Other symptoms and signs involving the musculoskeletal system: Secondary | ICD-10-CM

## 2013-08-12 DIAGNOSIS — N19 Unspecified kidney failure: Secondary | ICD-10-CM | POA: Diagnosis not present

## 2013-08-12 DIAGNOSIS — K5010 Crohn's disease of large intestine without complications: Secondary | ICD-10-CM | POA: Diagnosis not present

## 2013-08-12 DIAGNOSIS — I059 Rheumatic mitral valve disease, unspecified: Secondary | ICD-10-CM | POA: Diagnosis not present

## 2013-08-12 DIAGNOSIS — Z950 Presence of cardiac pacemaker: Secondary | ICD-10-CM | POA: Diagnosis not present

## 2013-08-12 DIAGNOSIS — M6281 Muscle weakness (generalized): Secondary | ICD-10-CM

## 2013-08-12 DIAGNOSIS — I4891 Unspecified atrial fibrillation: Secondary | ICD-10-CM | POA: Diagnosis not present

## 2013-08-12 LAB — BASIC METABOLIC PANEL
BUN: 67 mg/dL — ABNORMAL HIGH (ref 6–23)
CO2: 14 mEq/L — ABNORMAL LOW (ref 19–32)
CREATININE: 1.71 mg/dL — AB (ref 0.50–1.35)
Calcium: 8.8 mg/dL (ref 8.4–10.5)
Chloride: 106 mEq/L (ref 96–112)
GFR, EST AFRICAN AMERICAN: 37 mL/min — AB (ref >90)
GFR, EST NON AFRICAN AMERICAN: 32 mL/min — AB (ref >90)
Glucose, Bld: 94 mg/dL (ref 70–99)
Potassium: 4.6 mEq/L (ref 3.7–5.3)
Sodium: 135 mEq/L — ABNORMAL LOW (ref 137–147)

## 2013-08-12 LAB — CBC
HEMATOCRIT: 30.3 % — AB (ref 39.0–52.0)
Hemoglobin: 10.7 g/dL — ABNORMAL LOW (ref 13.0–17.0)
MCH: 34.2 pg — ABNORMAL HIGH (ref 26.0–34.0)
MCHC: 35.3 g/dL (ref 30.0–36.0)
MCV: 96.8 fL (ref 78.0–100.0)
Platelets: 156 10*3/uL (ref 150–400)
RBC: 3.13 MIL/uL — ABNORMAL LOW (ref 4.22–5.81)
RDW: 14.6 % (ref 11.5–15.5)
WBC: 9.7 10*3/uL (ref 4.0–10.5)

## 2013-08-12 LAB — D-DIMER, QUANTITATIVE (NOT AT ARMC): D DIMER QUANT: 4.81 ug{FEU}/mL — AB (ref 0.00–0.48)

## 2013-08-12 LAB — CLOSTRIDIUM DIFFICILE BY PCR: Toxigenic C. Difficile by PCR: NEGATIVE

## 2013-08-12 MED ORDER — DIPHENOXYLATE-ATROPINE 2.5-0.025 MG PO TABS
1.0000 | ORAL_TABLET | Freq: Four times a day (QID) | ORAL | Status: DC
Start: 2013-08-12 — End: 2013-08-12
  Administered 2013-08-12: 1 via ORAL
  Filled 2013-08-12: qty 1

## 2013-08-12 MED ORDER — DARIFENACIN HYDROBROMIDE ER 15 MG PO TB24
15.0000 mg | ORAL_TABLET | Freq: Every day | ORAL | Status: DC
  Administered 2013-08-12 – 2013-08-14 (×3): 15 mg via ORAL
  Filled 2013-08-12 (×3): qty 1

## 2013-08-12 MED ORDER — DIPHENOXYLATE-ATROPINE 2.5-0.025 MG PO TABS: 1.0000 | ORAL_TABLET | Freq: Four times a day (QID) | ORAL | Status: DC | PRN

## 2013-08-12 MED ORDER — HYDROCORTISONE 2.5 % RE CREA
1.0000 "application " | TOPICAL_CREAM | Freq: Every day | RECTAL | Status: DC | PRN
  Filled 2013-08-12: qty 28.35

## 2013-08-12 MED ORDER — ACETAMINOPHEN 650 MG RE SUPP: 650.0000 mg | Freq: Four times a day (QID) | RECTAL | Status: DC | PRN

## 2013-08-12 MED ORDER — ONDANSETRON HCL 4 MG PO TABS: 4.0000 mg | ORAL_TABLET | Freq: Four times a day (QID) | ORAL | Status: DC | PRN

## 2013-08-12 MED ORDER — ALLOPURINOL 300 MG PO TABS
300.0000 mg | ORAL_TABLET | Freq: Every day | ORAL | Status: DC
  Administered 2013-08-12 – 2013-08-14 (×3): 300 mg via ORAL
  Filled 2013-08-12 (×3): qty 1

## 2013-08-12 MED ORDER — ACETAMINOPHEN 325 MG PO TABS: 650.0000 mg | ORAL_TABLET | Freq: Four times a day (QID) | ORAL | Status: DC | PRN

## 2013-08-12 MED ORDER — CARVEDILOL 6.25 MG PO TABS
6.2500 mg | ORAL_TABLET | Freq: Two times a day (BID) | ORAL | Status: DC
  Filled 2013-08-12 (×3): qty 1

## 2013-08-12 MED ORDER — ONDANSETRON HCL 4 MG/2ML IJ SOLN: 4.0000 mg | Freq: Four times a day (QID) | INTRAMUSCULAR | Status: DC | PRN

## 2013-08-12 MED ORDER — DIPHENOXYLATE-ATROPINE 2.5-0.025 MG PO TABS
2.0000 | ORAL_TABLET | Freq: Four times a day (QID) | ORAL | Status: DC
  Administered 2013-08-12 – 2013-08-14 (×8): 2 via ORAL
  Filled 2013-08-12 (×8): qty 2
  Filled 2013-08-12: qty 1

## 2013-08-12 MED ORDER — BRIMONIDINE TARTRATE 0.2 % OP SOLN
1.0000 [drp] | Freq: Three times a day (TID) | OPHTHALMIC | Status: DC
  Administered 2013-08-12 – 2013-08-13 (×4): 1 [drp] via OPHTHALMIC
  Filled 2013-08-12: qty 5

## 2013-08-12 MED ORDER — ALBUTEROL SULFATE (2.5 MG/3ML) 0.083% IN NEBU: 2.5000 mg | INHALATION_SOLUTION | Freq: Four times a day (QID) | RESPIRATORY_TRACT | Status: DC | PRN

## 2013-08-12 MED ORDER — ALBUTEROL SULFATE (2.5 MG/3ML) 0.083% IN NEBU
2.5000 mg | INHALATION_SOLUTION | Freq: Four times a day (QID) | RESPIRATORY_TRACT | Status: DC
  Administered 2013-08-12: 2.5 mg via RESPIRATORY_TRACT
  Filled 2013-08-12: qty 3

## 2013-08-12 MED ORDER — TROSPIUM CHLORIDE ER 60 MG PO CP24: 1.0000 | ORAL_CAPSULE | Freq: Every day | ORAL | Status: DC

## 2013-08-12 MED ORDER — HEPARIN SODIUM (PORCINE) 5000 UNIT/ML IJ SOLN
5000.0000 [IU] | Freq: Three times a day (TID) | INTRAMUSCULAR | Status: DC
  Administered 2013-08-12 – 2013-08-14 (×9): 5000 [IU] via SUBCUTANEOUS
  Filled 2013-08-12 (×9): qty 1

## 2013-08-12 MED ORDER — SODIUM POLYSTYRENE SULFONATE 15 GM/60ML PO SUSP
15.0000 g | Freq: Once | ORAL | Status: AC
  Administered 2013-08-12: 15 g via ORAL
  Filled 2013-08-12: qty 60

## 2013-08-12 MED ORDER — ASPIRIN EC 325 MG PO TBEC
325.0000 mg | DELAYED_RELEASE_TABLET | Freq: Every day | ORAL | Status: DC
  Administered 2013-08-12 – 2013-08-14 (×3): 325 mg via ORAL
  Filled 2013-08-12 (×3): qty 1

## 2013-08-12 MED ORDER — HYDROCODONE-ACETAMINOPHEN 5-325 MG PO TABS: 1.0000 | ORAL_TABLET | ORAL | Status: DC | PRN

## 2013-08-12 MED ORDER — TAMSULOSIN HCL 0.4 MG PO CAPS
0.4000 mg | ORAL_CAPSULE | Freq: Every day | ORAL | Status: DC
  Administered 2013-08-12 – 2013-08-14 (×3): 0.4 mg via ORAL
  Filled 2013-08-12 (×3): qty 1

## 2013-08-12 MED ORDER — MESALAMINE 1.2 G PO TBEC
2.4000 g | DELAYED_RELEASE_TABLET | Freq: Two times a day (BID) | ORAL | Status: DC
  Administered 2013-08-12 – 2013-08-14 (×6): 2.4 g via ORAL
  Filled 2013-08-12 (×7): qty 2

## 2013-08-12 MED ORDER — SODIUM CHLORIDE 0.9 % IV SOLN
INTRAVENOUS | Status: DC
  Administered 2013-08-12 – 2013-08-14 (×6): via INTRAVENOUS

## 2013-08-12 MED ORDER — ALBUTEROL SULFATE (2.5 MG/3ML) 0.083% IN NEBU: 2.5000 mg | INHALATION_SOLUTION | RESPIRATORY_TRACT | Status: DC | PRN

## 2013-08-12 MED ORDER — BRIMONIDINE TARTRATE 0.15 % OP SOLN
1.0000 [drp] | Freq: Three times a day (TID) | OPHTHALMIC | Status: DC
  Filled 2013-08-12: qty 5

## 2013-08-12 NOTE — Progress Notes (Signed)
Echo Lab  2D Echocardiogram completed.  [place], RDCS 08/12/2013 10:06 AM

## 2013-08-12 NOTE — Progress Notes (Signed)
Subjective: Patient reports that he feels that his left arm weakness is better today.  Continues to have wrist drop though.  Reports no sensory loss.  No new focal complaints.    Objective: Current vital signs: BP 96/56  Pulse 84  Temp(Src) 98.1 F (36.7 C) (Oral)  Resp 20  Ht 6\' 1"  (1.854 m)  Wt 91.1 kg (200 lb 13.4 oz)  BMI 26.50 kg/m2  SpO2 96% Vital signs in last 24 hours: Temp:  [97.8 F (36.6 C)-98.8 F (37.1 C)] 98.1 F (36.7 C) (03/12 0541) Pulse Rate:  [84-92] 84 (03/12 0541) Resp:  [18-20] 20 (03/12 0541) BP: (96-124)/(52-86) 96/56 mmHg (03/12 0541) SpO2:  [94 %-98 %] 96 % (03/12 0747) Weight:  [91.1 kg (200 lb 13.4 oz)] 91.1 kg (200 lb 13.4 oz) (03/12 0130)  Intake/Output from previous day:   Intake/Output this shift: Total I/O In: -  Out: 400 [Urine:400] Nutritional status: Clear Liquid  Neurologic Exam: Mental Status:  Alert, oriented, thought content appropriate. Speech fluent without evidence of aphasia. Able to follow 3 step commands without difficulty.  Cranial Nerves:  II: Discs flat bilaterally; Visual fields grossly normal, pupils equal, round, reactive to light and accommodation  III,IV, VI: mild left eyelid ptosis, extra-ocular motions intact bilaterally  V,VII: smile symmetric, facial light touch sensation normal bilaterally  VIII: hearing normal bilaterally  IX,X: gag reflex present  XI: bilateral shoulder shrug  XII: midline tongue extension without atrophy or fasciculations  Motor:  Patient able to perform some left wrist extension but unable to get the wrist fully extended.  Has difficulty following commands due to hearing to determine full strength on inversion and eversion at the wrist.  5/5 biceps and deltoid strength.  5-/5 triceps strength.   Sensory: Pinprick and light touch intact throughout, bilaterally  Deep Tendon Reflexes:  1+ throughout   Lab Results: Basic Metabolic Panel:  Recent Labs Lab 08/11/13 1821 08/11/13 1915  08/11/13 2204 08/12/13 0705  NA 133* 133*  --  135*  K 6.0* 5.0 4.8 4.6  CL 107 101  --  106  CO2  --  17*  --  14*  GLUCOSE 97 99  --  94  BUN 88* 82*  --  67*  CREATININE 2.50* 2.09*  --  1.71*  CALCIUM  --  9.5  --  8.8    Liver Function Tests:  Recent Labs Lab 08/11/13 1915  AST 15  ALT 13  ALKPHOS 64  BILITOT 0.8  PROT 7.3  ALBUMIN 3.0*   No results found for this basename: LIPASE, AMYLASE,  in the last 168 hours No results found for this basename: AMMONIA,  in the last 168 hours  CBC:  Recent Labs Lab 08/11/13 1821 08/11/13 1915 08/12/13 0705  WBC  --  14.3* 9.7  NEUTROABS  --  11.4*  --   HGB 13.3 11.3* 10.7*  HCT 39.0 32.0* 30.3*  MCV  --  95.8 96.8  PLT  --  185 156    Cardiac Enzymes: No results found for this basename: CKTOTAL, CKMB, CKMBINDEX, TROPONINI,  in the last 168 hours  Lipid Panel: No results found for this basename: CHOL, TRIG, HDL, CHOLHDL, VLDL, LDLCALC,  in the last 168 hours  CBG: No results found for this basename: GLUCAP,  in the last 168 hours  Microbiology: Results for orders placed during the hospital encounter of 05/26/13  CULTURE, BLOOD (ROUTINE X 2)     Status: None   Collection Time  05/26/13  9:15 PM      Result Value Ref Range Status   Specimen Description BLOOD RIGHT ARM   Final   Special Requests BOTTLES DRAWN AEROBIC ONLY 10CC   Final   Culture  Setup Time     Final   Value: 05/27/2013 03:46     Performed at [facility]   Culture     Final   Value: NO GROWTH 5 DAYS     Performed at [facility]   Report Status 06/02/2013 FINAL   Final  CULTURE, BLOOD (ROUTINE X 2)     Status: None   Collection Time    05/26/13  9:35 PM      Result Value Ref Range Status   Specimen Description BLOOD RIGHT HAND   Final   Special Requests BOTTLES DRAWN AEROBIC ONLY 2CC   Final   Culture  Setup Time     Final   Value: 05/27/2013 03:46     Performed at [facility]   Culture     Final   Value: NO  GROWTH 5 DAYS     Performed at [facility]   Report Status 06/02/2013 FINAL   Final  MRSA PCR SCREENING     Status: None   Collection Time    05/27/13  1:20 AM      Result Value Ref Range Status   MRSA by PCR NEGATIVE  NEGATIVE Final   Comment:            The GeneXpert MRSA Assay (FDA     approved for NASAL specimens     only), is one component of a     comprehensive MRSA colonization     surveillance program. It is not     intended to diagnose MRSA     infection nor to guide or     monitor treatment for     MRSA infections.  CLOSTRIDIUM DIFFICILE BY PCR     Status: None   Collection Time    05/27/13  4:06 AM      Result Value Ref Range Status   C difficile by pcr NEGATIVE  NEGATIVE Final  STOOL CULTURE     Status: None   Collection Time    05/27/13  4:06 AM      Result Value Ref Range Status   Specimen Description STOOL   Final   Special Requests NONE   Final   Culture     Final   Value: NO SALMONELLA, SHIGELLA, CAMPYLOBACTER, YERSINIA, OR E.COLI 0157:H7 ISOLATED     Performed at [facility]   Report Status 05/30/2013 FINAL   Final    Coagulation Studies:  Recent Labs  08/11/13 1915  LABPROT 14.3  INR 1.13    Imaging: Dg Chest 2 View  08/11/2013   CLINICAL DATA Left arm weakness.  EXAM CHEST  2 VIEW  COMPARISON May 26, 2013.  FINDINGS Stable cardiomediastinal silhouette. Right-sided pacemaker is unchanged in position. Stable central pulmonary vascular congestion is noted. No acute pulmonary disease is noted. No pneumothorax or pleural effusion is noted. Degenerative changes are noted in the thoracic spine.  IMPRESSION No acute cardiopulmonary abnormality seen.  SIGNATURE  Electronically Signed   By: Sabino Dick M.D.   On: 08/11/2013 21:42   Ct Head Wo Contrast  08/11/2013   CLINICAL DATA Weakness with left hand grip on physical examination. Acute mental status changes earlier today, patient now back to baseline.  EXAM CT HEAD  WITHOUT CONTRAST   TECHNIQUE Contiguous axial images were obtained from the base of the skull through the vertex without intravenous contrast.  COMPARISON CT HEAD W/O CM dated 05/26/2013  FINDINGS Severe cortical, deep, and cerebellar atrophy, unchanged. Mild changes of small vessel disease of the white matter, unchanged. No mass lesion. No midline shift. No acute hemorrhage or hematoma. No extra-axial fluid collections. No evidence of acute infarction. No significant interval change.  Prior bilateral frontal burr holes. No focal osseous abnormalities elsewhere involving the skull. Opacification of the right maxillary sinus with possible erosion of the medial Lass[place] and protrusion of sinus contents into the nasal cavity, unchanged. Remaining visualized paranasal sinuses, bilateral mastoid air cells, and bilateral middle ear cavities well-aerated.  IMPRESSION 1. No acute intracranial abnormality. 2. Stable severe generalized atrophy and mild chronic microvascular ischemic changes of the white matter. 3. Stable chronic right maxillary sinusitis. There may be erosion of the medial sinus Mateus with protrusion of sinus contents into the nasal cavity, query mucocele.  SIGNATURE  Electronically Signed   By: Evangeline Dakin M.D.   On: 08/11/2013 18:48    Medications:  I have reviewed the patient's current medications. Scheduled: . allopurinol  300 mg Oral Daily  . aspirin EC  325 mg Oral Daily  . brimonidine  1 drop Both Eyes 3 times per day  . darifenacin  15 mg Oral Daily  . heparin  5,000 Units Subcutaneous 3 times per day  . mesalamine  2.4 g Oral BID  . tamsulosin  0.4 mg Oral Daily    Assessment/Plan: Likely radial nerve palsy.  Head CT shows no acute changes.  MRI of the brain unable to be performed.  Patient reports some improvement in symptoms which again makes radial nerve palsy more likely.    Recommendations: 1.  Repeat head CT in AM 2.  Continue aspirin 3.  OT evaluation.     LOS: 1 day   Alexis Goodell,  MD [facility] [phone] 08/12/2013  9:08 AM

## 2013-08-12 NOTE — Progress Notes (Signed)
[facility] PROGRESS NOTE  Edward Harvey QQI:[id number] DOB: 03/30/1916 DOA: 08/11/2013 PCP: Noralee Space, MD  Assessment/Plan:  Principal Problem:   Acute renal failure improving on IVF. BP borderline. D/c coreg. Active Problems:   AV BLOCK, COMPLETE   Diarrhea, CHRONIC. On outpatient lomotil. c diff neg. Schedule lomotil   PPM-[place]   Atrial fibrillation   Crohn's colitis on lialda   Left arm weakness: likely radial nerve palsy, r/o CVA. Repeat CT in am. ot eval Hyperkalemia resolved PT eval  Code Status:  full Family Communication:  son Disposition Plan:  home  HPI/Subjective: Hand a little stronger. No N/V, abd pain. Frequent loose stool  Objective: Filed Vitals:   08/12/13 0541  BP: 96/56  Pulse: 84  Temp: 98.1 F (36.7 C)  Resp: 20    Intake/Output Summary (Last 24 hours) at 08/12/13 1346 Last data filed at 08/12/13 1343  Gross per 24 hour  Intake 1536.25 ml  Output    800 ml  Net 736.25 ml   Filed Weights   08/12/13 0130  Weight: 91.1 kg (200 lb 13.4 oz)    Exam:   General:  Alert, oriented, eating clear liquid tray  Cardiovascular: RRR without MGR  Respiratory: CTA without WRR  Abdomen: S, NT, ND  Ext: no CCE  Neuro. Left wrist drop  Basic Metabolic Panel:  Recent Labs Lab 08/11/13 1821 08/11/13 1915 08/11/13 2204 08/12/13 0705  NA 133* 133*  --  135*  K 6.0* 5.0 4.8 4.6  CL 107 101  --  106  CO2  --  17*  --  14*  GLUCOSE 97 99  --  94  BUN 88* 82*  --  67*  CREATININE 2.50* 2.09*  --  1.71*  CALCIUM  --  9.5  --  8.8   Liver Function Tests:  Recent Labs Lab 08/11/13 1915  AST 15  ALT 13  ALKPHOS 64  BILITOT 0.8  PROT 7.3  ALBUMIN 3.0*   No results found for this basename: LIPASE, AMYLASE,  in the last 168 hours No results found for this basename: AMMONIA,  in the last 168 hours CBC:  Recent Labs Lab 08/11/13 1821 08/11/13 1915 08/12/13 0705  WBC  --  14.3* 9.7  NEUTROABS  --  11.4*  --   HGB  13.3 11.3* 10.7*  HCT 39.0 32.0* 30.3*  MCV  --  95.8 96.8  PLT  --  185 156   Cardiac Enzymes: No results found for this basename: CKTOTAL, CKMB, CKMBINDEX, TROPONINI,  in the last 168 hours BNP (last 3 results)  Recent Labs  05/26/13 2127 08/11/13 1915  PROBNP 278.5 188.8   CBG: No results found for this basename: GLUCAP,  in the last 168 hours  Recent Results (from the past 240 hour(s))  CLOSTRIDIUM DIFFICILE BY PCR     Status: None   Collection Time    08/12/13 11:47 AM      Result Value Ref Range Status   C difficile by pcr NEGATIVE  NEGATIVE Final     Studies: Dg Chest 2 View  08/11/2013   CLINICAL DATA Left arm weakness.  EXAM CHEST  2 VIEW  COMPARISON May 26, 2013.  FINDINGS Stable cardiomediastinal silhouette. Right-sided pacemaker is unchanged in position. Stable central pulmonary vascular congestion is noted. No acute pulmonary disease is noted. No pneumothorax or pleural effusion is noted. Degenerative changes are noted in the thoracic spine.  IMPRESSION No acute cardiopulmonary abnormality seen.  SIGNATURE  Electronically Signed  By: Sabino Dick M.D.   On: 08/11/2013 21:42   Ct Head Wo Contrast  08/11/2013   CLINICAL DATA Weakness with left hand grip on physical examination. Acute mental status changes earlier today, patient now back to baseline.  EXAM CT HEAD WITHOUT CONTRAST  TECHNIQUE Contiguous axial images were obtained from the base of the skull through the vertex without intravenous contrast.  COMPARISON CT HEAD W/O CM dated 05/26/2013  FINDINGS Severe cortical, deep, and cerebellar atrophy, unchanged. Mild changes of small vessel disease of the white matter, unchanged. No mass lesion. No midline shift. No acute hemorrhage or hematoma. No extra-axial fluid collections. No evidence of acute infarction. No significant interval change.  Prior bilateral frontal burr holes. No focal osseous abnormalities elsewhere involving the skull. Opacification of the right  maxillary sinus with possible erosion of the medial Davie and protrusion of sinus contents into the nasal cavity, unchanged. Remaining visualized paranasal sinuses, bilateral mastoid air cells, and bilateral middle ear cavities well-aerated.  IMPRESSION 1. No acute intracranial abnormality. 2. Stable severe generalized atrophy and mild chronic microvascular ischemic changes of the white matter. 3. Stable chronic right maxillary sinusitis. There may be erosion of the medial sinus Regino with protrusion of sinus contents into the nasal cavity, query mucocele.  SIGNATURE  Electronically Signed   By: Evangeline Dakin M.D.   On: 08/11/2013 18:48    Scheduled Meds: . allopurinol  300 mg Oral Daily  . aspirin EC  325 mg Oral Daily  . brimonidine  1 drop Both Eyes 3 times per day  . darifenacin  15 mg Oral Daily  . heparin  5,000 Units Subcutaneous 3 times per day  . mesalamine  2.4 g Oral BID  . tamsulosin  0.4 mg Oral Daily   Continuous Infusions: . sodium chloride 75 mL/hr at 08/12/13 0130    Time spent: 35 minutes  [facility] [place] [facility] Pager [phone]. If 7PM-7AM, please contact night-coverage at [email or web address], password [facility] 08/12/2013, 1:46 PM  LOS: 1 day

## 2013-08-13 ENCOUNTER — Encounter (HOSPITAL_COMMUNITY): Payer: Self-pay | Admitting: Radiology

## 2013-08-13 ENCOUNTER — Inpatient Hospital Stay (HOSPITAL_COMMUNITY): Payer: Medicare Other

## 2013-08-13 DIAGNOSIS — F0390 Unspecified dementia without behavioral disturbance: Secondary | ICD-10-CM

## 2013-08-13 DIAGNOSIS — K5010 Crohn's disease of large intestine without complications: Secondary | ICD-10-CM | POA: Diagnosis not present

## 2013-08-13 DIAGNOSIS — N179 Acute kidney failure, unspecified: Secondary | ICD-10-CM | POA: Diagnosis not present

## 2013-08-13 DIAGNOSIS — E860 Dehydration: Secondary | ICD-10-CM | POA: Diagnosis not present

## 2013-08-13 DIAGNOSIS — R197 Diarrhea, unspecified: Secondary | ICD-10-CM | POA: Diagnosis not present

## 2013-08-13 DIAGNOSIS — R29818 Other symptoms and signs involving the nervous system: Secondary | ICD-10-CM | POA: Diagnosis not present

## 2013-08-13 DIAGNOSIS — M6281 Muscle weakness (generalized): Secondary | ICD-10-CM | POA: Diagnosis not present

## 2013-08-13 LAB — URINALYSIS, ROUTINE W REFLEX MICROSCOPIC
Bilirubin Urine: NEGATIVE
Glucose, UA: NEGATIVE mg/dL
HGB URINE DIPSTICK: NEGATIVE
Ketones, ur: NEGATIVE mg/dL
Leukocytes, UA: NEGATIVE
Nitrite: NEGATIVE
PROTEIN: NEGATIVE mg/dL
Specific Gravity, Urine: 1.022 (ref 1.005–1.030)
UROBILINOGEN UA: 0.2 mg/dL (ref 0.0–1.0)
pH: 5 (ref 5.0–8.0)

## 2013-08-13 LAB — BASIC METABOLIC PANEL
BUN: 37 mg/dL — AB (ref 6–23)
CO2: 14 mEq/L — ABNORMAL LOW (ref 19–32)
Calcium: 8.4 mg/dL (ref 8.4–10.5)
Chloride: 110 mEq/L (ref 96–112)
Creatinine, Ser: 1.17 mg/dL (ref 0.50–1.35)
GFR calc Af Amer: 59 mL/min — ABNORMAL LOW (ref >90)
GFR calc non Af Amer: 51 mL/min — ABNORMAL LOW (ref >90)
Glucose, Bld: 99 mg/dL (ref 70–99)
POTASSIUM: 4.2 meq/L (ref 3.7–5.3)
SODIUM: 138 meq/L (ref 137–147)

## 2013-08-13 LAB — CBC
HCT: 30.4 % — ABNORMAL LOW (ref 39.0–52.0)
HEMOGLOBIN: 10.5 g/dL — AB (ref 13.0–17.0)
MCH: 33.5 pg (ref 26.0–34.0)
MCHC: 34.5 g/dL (ref 30.0–36.0)
MCV: 97.1 fL (ref 78.0–100.0)
Platelets: 157 10*3/uL (ref 150–400)
RBC: 3.13 MIL/uL — ABNORMAL LOW (ref 4.22–5.81)
RDW: 14.8 % (ref 11.5–15.5)
WBC: 7.1 10*3/uL (ref 4.0–10.5)

## 2013-08-13 LAB — OVA AND PARASITE EXAMINATION: Ova and parasites: NONE SEEN

## 2013-08-13 MED ORDER — BRIMONIDINE TARTRATE 0.2 % OP SOLN
1.0000 [drp] | Freq: Two times a day (BID) | OPHTHALMIC | Status: DC
Start: 2013-08-13 — End: 2013-08-14
  Administered 2013-08-13 – 2013-08-14 (×2): 1 [drp] via OPHTHALMIC

## 2013-08-13 NOTE — Clinical Social Work Psychosocial (Signed)
Clinical Social Work Department BRIEF PSYCHOSOCIAL ASSESSMENT 08/13/2013  Patient:  Edward Harvey, Edward Harvey     Account Number:  [id number]     Admit date:  08/11/2013  Clinical Social Worker:  Donna Christen  Date/Time:  08/13/2013 04:13 PM  Referred by:  Physician  Date Referred:  08/13/2013 Referred for  SNF Placement   Other Referral:   none.   Interview type:  Family Other interview type:   CSW spoke to pt's son, Sabino Snipes, JR    PSYCHOSOCIAL DATA Living Status:  FAMILY Admitted from facility:   Level of care:   Primary support name:  Sabino Snipes, JR Primary support relationship to patient:  CHILD, ADULT Degree of support available:   Strong.    CURRENT CONCERNS Current Concerns  Post-Acute Placement   Other Concerns:   none.    SOCIAL WORK ASSESSMENT / PLAN CSW received call from MD stating that pt was supposed to discharge home with home health services and family. Family informed MD that they are now unable to care for pt at home and will require SNF placement. CSW spoke to pt's son regarding discharge disposition. CSW informed son that pt was ready for discharge and family would have to pick from SNF beds that are available to admissions on Saturday. Pt's son stated that he understood. CSW has faxed clinical information to [facility]. CSW left weekend CSW handoff regarding information above. MD notified regarding information above.   Assessment/plan status:  Psychosocial Support/Ongoing Assessment of Needs Other assessment/ plan:   none.   Information/referral to community resources:   [facility] SNF bed offers able to admit on Saturday.    PATIENTS/FAMILYS RESPONSE TO PLAN OF CARE: Pt's son stated that he understood and was agreeable to CSW plan of care.       Pati Gallo, [place] Social Worker [phone]

## 2013-08-13 NOTE — Clinical Social Work Placement (Addendum)
Clinical Social Work Department CLINICAL SOCIAL WORK PLACEMENT NOTE 08/13/2013  Patient:  Edward Harvey, Edward Harvey  Account Number:  [id number] Admit date:  08/11/2013  Clinical Social Worker:  Raquel Sarna [place], LCSWA  Date/time:  08/13/2013 04:17 PM  Clinical Social Work is seeking post-discharge placement for this patient at the following level of care:   [place]   (*CSW will update this form in Epic as items are completed)   08/13/2013  Patient/family provided with [place] Department of Clinical Social Work's list of facilities offering this level of care within the geographic area requested by the patient (or if unable, by the patient's family).  08/13/2013  Patient/family informed of their freedom to choose among providers that offer the needed level of care, that participate in Medicare, Medicaid or managed care program needed by the patient, have an available bed and are willing to accept the patient.  08/13/2013  Patient/family informed of [facility]' ownership interest in [facility], as well as of the fact that they are under no obligation to receive care at this facility.  PASARR submitted to [employer] on  PASARR number received from [place] on   FL2 transmitted to all facilities in geographic area requested by pt/family on  08/13/2013 FL2 transmitted to all facilities within larger geographic area on   Patient informed that his/her managed care company has contracts with or will negotiate with  certain facilities, including the following:     Patient/family informed of bed offers received:   Patient chooses bed at  Physician recommends and patient chooses bed at    Patient to be transferred to [facility] on 08/14/13- [place], [place]   Patient to be transferred to facility by [place]   The following physician request were entered in Epic:   Additional Comments: PASARR already existing.  Pati Gallo, [place] Social  Worker [phone]

## 2013-08-13 NOTE — Progress Notes (Signed)
Subjective: Patient with further improvement today.  Reports no new focal complaints.    Objective: Current vital signs: BP 112/70  Pulse 65  Temp(Src) 97.5 F (36.4 C) (Oral)  Resp 18  Ht 6\' 1"  (1.854 m)  Wt 91.1 kg (200 lb 13.4 oz)  BMI 26.50 kg/m2  SpO2 99% Vital signs in last 24 hours: Temp:  [97.5 F (36.4 C)-97.8 F (36.6 C)] 97.5 F (36.4 C) (03/13 0519) Pulse Rate:  [62-79] 65 (03/13 0519) Resp:  [18-20] 18 (03/13 0519) BP: (90-112)/(58-70) 112/70 mmHg (03/13 0519) SpO2:  [98 %-100 %] 99 % (03/13 0519)  Intake/Output from previous day: 03/12 0701 - 03/13 0700 In: 3716.3 [P.O.:1200; I.V.:2516.3] Out: 1600 [Urine:1600] Intake/Output this shift:   Nutritional status: [place]  Neurologic Exam: Mental Status:  Alert, oriented, thought content appropriate. Speech fluent without evidence of aphasia. Able to follow 3 step commands without difficulty.  Cranial Nerves:  II: Discs flat bilaterally; Visual fields grossly normal, pupils equal, round, reactive to light and accommodation  III,IV, VI: mild left eyelid ptosis, extra-ocular motions intact bilaterally  V,VII: decreased left NLF, facial light touch sensation normal bilaterally  VIII: hearing normal bilaterally  IX,X: gag reflex present  XI: bilateral shoulder shrug  XII: midline tongue extension without atrophy or fasciculations  Motor:  Left hand grip improved at 5/5 with patient now able to perform full extension at the wrist Sensory: Pinprick and light touch intact throughout, bilaterally  Deep Tendon Reflexes:  1+ throughout   Lab Results: Basic Metabolic Panel:  Recent Labs Lab 08/11/13 1821 08/11/13 1915 08/11/13 2204 08/12/13 0705  NA 133* 133*  --  135*  K 6.0* 5.0 4.8 4.6  CL 107 101  --  106  CO2  --  17*  --  14*  GLUCOSE 97 99  --  94  BUN 88* 82*  --  67*  CREATININE 2.50* 2.09*  --  1.71*  CALCIUM  --  9.5  --  8.8    Liver Function Tests:  Recent Labs Lab 08/11/13 1915  AST  15  ALT 13  ALKPHOS 64  BILITOT 0.8  PROT 7.3  ALBUMIN 3.0*   No results found for this basename: LIPASE, AMYLASE,  in the last 168 hours No results found for this basename: AMMONIA,  in the last 168 hours  CBC:  Recent Labs Lab 08/11/13 1821 08/11/13 1915 08/12/13 0705 08/13/13 0910  WBC  --  14.3* 9.7 7.1  NEUTROABS  --  11.4*  --   --   HGB 13.3 11.3* 10.7* 10.5*  HCT 39.0 32.0* 30.3* 30.4*  MCV  --  95.8 96.8 97.1  PLT  --  185 156 157    Cardiac Enzymes: No results found for this basename: CKTOTAL, CKMB, CKMBINDEX, TROPONINI,  in the last 168 hours  Lipid Panel: No results found for this basename: CHOL, TRIG, HDL, CHOLHDL, VLDL, LDLCALC,  in the last 168 hours  CBG: No results found for this basename: GLUCAP,  in the last 168 hours  Microbiology: Results for orders placed during the hospital encounter of 08/11/13  CLOSTRIDIUM DIFFICILE BY PCR     Status: None   Collection Time    08/12/13 11:47 AM      Result Value Ref Range Status   C difficile by pcr NEGATIVE  NEGATIVE Final  STOOL CULTURE     Status: None   Collection Time    08/12/13 11:47 AM      Result Value Ref Range  Status   Specimen Description STOOL   Final   Special Requests NONE   Final   Culture     Final   Value: Culture reincubated for better growth     Performed at [facility]   Report Status PENDING   Incomplete    Coagulation Studies:  Recent Labs  08/11/13 1915  LABPROT 14.3  INR 1.13    Imaging: Dg Chest 2 View  08/11/2013   CLINICAL DATA Left arm weakness.  EXAM CHEST  2 VIEW  COMPARISON May 26, 2013.  FINDINGS Stable cardiomediastinal silhouette. Right-sided pacemaker is unchanged in position. Stable central pulmonary vascular congestion is noted. No acute pulmonary disease is noted. No pneumothorax or pleural effusion is noted. Degenerative changes are noted in the thoracic spine.  IMPRESSION No acute cardiopulmonary abnormality seen.  SIGNATURE  Electronically  Signed   By: Sabino Dick M.D.   On: 08/11/2013 21:42   Ct Head Wo Contrast  08/13/2013   CLINICAL DATA:  Improving left arm weakness with wrist drop, likely radial nerve palsy. Stroke risk factors include atrial fibrillation, hypertension, and coronary artery disease.  EXAM: CT HEAD WITHOUT CONTRAST  TECHNIQUE: Contiguous axial images were obtained from the base of the skull through the vertex without contrast.  COMPARISON:  CT head 08/11/2013 and 05/26/2013.  FINDINGS: Advanced atrophy with chronic microvascular ischemic change. Bilateral burr holes from chronic subdural evacuation. No acute stroke or hemorrhage. No mass lesion or hydrocephalus. Slight prominence of the extracerebral CSF spaces felt to represent atrophy rather than hygromas. Hyperostosis. Bilateral cataract extraction. No sinus or mastoid disease.  IMPRESSION: No evolving right hemisphere infarct to explain left arm weakness. No intracranial hemorrhage. Chronic changes as described.   Electronically Signed   By: Rolla Flatten M.D.   On: 08/13/2013 08:36   Ct Head Wo Contrast  08/11/2013   CLINICAL DATA Weakness with left hand grip on physical examination. Acute mental status changes earlier today, patient now back to baseline.  EXAM CT HEAD WITHOUT CONTRAST  TECHNIQUE Contiguous axial images were obtained from the base of the skull through the vertex without intravenous contrast.  COMPARISON CT HEAD W/O CM dated 05/26/2013  FINDINGS Severe cortical, deep, and cerebellar atrophy, unchanged. Mild changes of small vessel disease of the white matter, unchanged. No mass lesion. No midline shift. No acute hemorrhage or hematoma. No extra-axial fluid collections. No evidence of acute infarction. No significant interval change.  Prior bilateral frontal burr holes. No focal osseous abnormalities elsewhere involving the skull. Opacification of the right maxillary sinus with possible erosion of the medial Yahnke and protrusion of sinus contents into the  nasal cavity, unchanged. Remaining visualized paranasal sinuses, bilateral mastoid air cells, and bilateral middle ear cavities well-aerated.  IMPRESSION 1. No acute intracranial abnormality. 2. Stable severe generalized atrophy and mild chronic microvascular ischemic changes of the white matter. 3. Stable chronic right maxillary sinusitis. There may be erosion of the medial sinus Zaro with protrusion of sinus contents into the nasal cavity, query mucocele.  SIGNATURE  Electronically Signed   By: Evangeline Dakin M.D.   On: 08/11/2013 18:48    Medications:  I have reviewed the patient's current medications. Scheduled: . allopurinol  300 mg Oral Daily  . aspirin EC  325 mg Oral Daily  . brimonidine  1 drop Both Eyes BID  . darifenacin  15 mg Oral Daily  . diphenoxylate-atropine  2 tablet Oral QID  . heparin  5,000 Units Subcutaneous 3 times per day  .  mesalamine  2.4 g Oral BID  . tamsulosin  0.4 mg Oral Daily    Assessment/Plan: Patient improved.  Repeat head CT reviewed and shows no acute changes or changes from last imaging.  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  Patient remains on ASA.    Recommendations: 1.  With improvement in strength no further work up recommended at this time.  Patient will not require a NCV/EMG as an outpatient.  2.  Continue ASA daily    LOS: 2 days   Alexis Goodell, MD [facility] [phone] 08/13/2013  10:00 AM

## 2013-08-13 NOTE — Care Management Note (Signed)
    Page 1 of 1   08/13/2013     1:52:55 PM   CARE MANAGEMENT NOTE 08/13/2013  Patient:  Edward Harvey, Edward Harvey   Account Number:  [id number]  Date Initiated:  08/13/2013  Documentation initiated by:  Magdalen Spatz  Subjective/Objective Assessment:     Action/Plan:   Anticipated DC Date:  08/13/2013   Anticipated DC Plan:  [place]         Choice offered to / List presented to:  C-1 Patient        Ideal arranged  [place].   Status of service:  Completed, signed off Medicare Important Message given?   (If response is "NO", the following Medicare IM given date fields will be blank) Date Medicare IM given:   Date Additional Medicare IM given:    Discharge Disposition:    Per UR Regulation:    If discussed at Long Length of Stay Meetings, dates discussed:    Comments:  08-13-13 Spoke to patient's son Jeremaih Klima JR on phone . Magdalen Spatz RN BSN [phone]

## 2013-08-13 NOTE — Progress Notes (Signed)
PT Cancellation Note  Patient Details Name: Edward Harvey MRN: [id number] DOB: 12-Nov-1915   Cancelled Treatment:    Reason Eval/Treat Not Completed: Patient at procedure or test/unavailable (pt being transported out of room to [place])   [place], [place] 08/13/2013, 8:02 AM Elwyn Reach, [place]

## 2013-08-13 NOTE — Progress Notes (Signed)
Occupational Therapy Evaluation Patient Details Name: Edward Harvey MRN: [id number] DOB: [birth year] Today's Date: 08/13/2013 Time: 7048-8891 OT Time Calculation (min): 12 min  OT Assessment / Plan / Recommendation History of present illness Edward Harvey  is a 80 y.o. male,  With past medical history significant for diverticulosis and history of intestinal obstruction, hemorrhage and history of coronary artery disease brought today because he couldn't extend his left wrist. His son noticed that and he reports that he has been having diarrhea for the last 2 weeks   Clinical Impression   Pt presents with symptoms consistent with radial nerve palsy. Per pt, his hand is getting better. Rec HHOT to follow pt after D/C and assess any need for radial n palsy splint. Family can assist with self care needs.     OT Assessment  All further OT needs can be met in the next venue of care    Follow Up Recommendations  Home health OT;Supervision/Assistance - 24 hour    Barriers to Discharge      Equipment Recommendations  None recommended by OT    Recommendations for Other Services    Frequency       Precautions / Restrictions Precautions Precautions: Fall Precaution Comments: limited vision left eye   Pertinent Vitals/Pain no apparent distress     ADL  ADL Comments: Family assists with ADL as needed. Appears at baseline. Pt able to feed self    OT Diagnosis: Generalized weakness  OT Problem List: Decreased strength;Decreased range of motion;Impaired UE functional use OT Treatment Interventions:     OT Goals(Current goals can be found in the care plan section) Acute Rehab OT Goals Patient Stated Goal: return home OT Goal Formulation: Patient unable to participate in goal setting Time For Goal Achievement:  (eval only)  Visit Information  Last OT Received On: 08/13/13 Assistance Needed: +1 History of Present Illness: Edward Harvey  is a 80 y.o. male,  With past medical history  significant for diverticulosis and history of intestinal obstruction, hemorrhage and history of coronary artery disease brought today because he couldn't extend his left wrist. His son noticed that and he reports that he has been having diarrhea for the last 2 weeks       Prior [place] expects to be discharged to:: Private residence Living Arrangements: Children Available Help at Discharge: Family;Available 24 hours/day Type of Home: House Home Access: Stairs to enter [employer] of Steps: 2 Entrance Stairs-Rails: Right Home Layout: One level Home Equipment: Walker - 2 wheels;Bedside commode;Shower seat;Toilet riser Prior Function Level of Independence: Needs assistance Gait / Transfers Assistance Needed: Uses RW.   ADL's / Homemaking Assistance Needed: Daughter performs all homemaking and son helps with washing pt back. Pt reports he gets dressed on his own Communication / Swallowing Assistance Needed:  very HOH, limited vision Left eye Communication Communication: HOH         Vision/Perception Vision - History Baseline Vision: Other (comment)   Cognition  Cognition Arousal/Alertness: Awake/alert Behavior During Therapy: WFL for tasks assessed/performed Overall Cognitive Status: No family/caregiver present to determine baseline cognitive functioning Difficult to assess due to: Hard of hearing/deaf    Extremity/Trunk Assessment Upper Extremity Assessment Upper Extremity Assessment: LUE deficits/detail LUE Deficits / Details: apparent radial nerve palsy. Per patient has improved. unable to extend digits with wrist extended. weak wrist ext _0 +/5 LUE Sensation:  (appears intact - difficult to assess due to [facility]) LUE Coordination: decreased fine motor Lower Extremity  Assessment Lower Extremity Assessment: Generalized weakness Cervical / Trunk Assessment Cervical / Trunk Assessment: Kyphotic     Mobility Bed Mobility Overal  bed mobility: Needs Assistance Bed Mobility: Supine to Sit Supine to sit: Min assist General bed mobility comments: cueing for hand placement and assist to elevate trunk from surface Transfers Overall transfer level: Needs assistance Equipment used: Rolling walker (2 wheeled) Transfers: Sit to/from [facility] Sit to Stand: Min assist General transfer comment: cueing for hand placement with assist for anterior translation and cues to back fully to surface before letting go of RW and reaching for chair     Exercise     Balance  minguard for ambulation   End of Session OT - End of Session Activity Tolerance: Patient tolerated treatment well Patient left: Other (comment) (with PT) Nurse Communication: Other (comment) (need for [place])  GO     Cass Edinger,[place] 08/13/2013, 2:42 PM [facility], OTR/L  [phone] 08/13/2013

## 2013-08-13 NOTE — Progress Notes (Signed)
[facility] PROGRESS NOTE  Edward Harvey [id number] DOB: 1915/08/12 DOA: 08/11/2013 PCP: Noralee Space, MD  Assessment/Plan:  Principal Problem:   Acute renal failure resolved. Active Problems:   AV BLOCK, COMPLETE   Diarrhea, CHRONIC. On outpatient lomotil. c diff neg. Schedule lomotil. No stools since last night   PPM-[facility]   Atrial fibrillation   Crohn's colitis on lialda   Left arm weakness: secondary to radial nerve palsy, r/o CVA. Repeat CT negative. Much improved Hyperkalemia resolved Debility: yesterday, son said 24 hour assistance would be arranged and he did not want placement. Now he says 24 supervision cannot occur until Monday, then only periodically thereafter.  Pt is not safe to be alone. Have consulted SW for placement.  Patient is medically ready for discharge when disposition arranged.  Code Status:  full Family Communication:  son Disposition Plan:  home  HPI/Subjective: Left hand strength much improved. Per RN, no stool since yesterday afternoon  Objective: Filed Vitals:   08/13/13 1300  BP: 115/59  Pulse: 84  Temp: 97.7 F (36.5 C)  Resp: 18    Intake/Output Summary (Last 24 hours) at 08/13/13 1535 Last data filed at 08/13/13 0600  Gross per 24 hour  Intake   1720 ml  Output    800 ml  Net    920 ml   Filed Weights   08/12/13 0130  Weight: 91.1 kg (200 lb 13.4 oz)    Exam:   General:  Alert, oriented, eating clear liquid tray  Cardiovascular: RRR without MGR  Respiratory: CTA without WRR  Abdomen: S, NT, ND  Ext: no CCE  Neuro. Left wrist drop nearly resolved  Basic Metabolic Panel:  Recent Labs Lab 08/11/13 1821 08/11/13 1915 08/11/13 2204 08/12/13 0705 08/13/13 0910  NA 133* 133*  --  135* 138  K 6.0* 5.0 4.8 4.6 4.2  CL 107 101  --  106 110  CO2  --  17*  --  14* 14*  GLUCOSE 97 99  --  94 99  BUN 88* 82*  --  67* 37*  CREATININE 2.50* 2.09*  --  1.71* 1.17  CALCIUM  --  9.5  --  8.8 8.4   Liver  Function Tests:  Recent Labs Lab 08/11/13 1915  AST 15  ALT 13  ALKPHOS 64  BILITOT 0.8  PROT 7.3  ALBUMIN 3.0*   No results found for this basename: LIPASE, AMYLASE,  in the last 168 hours No results found for this basename: AMMONIA,  in the last 168 hours CBC:  Recent Labs Lab 08/11/13 1821 08/11/13 1915 08/12/13 0705 08/13/13 0910  WBC  --  14.3* 9.7 7.1  NEUTROABS  --  11.4*  --   --   HGB 13.3 11.3* 10.7* 10.5*  HCT 39.0 32.0* 30.3* 30.4*  MCV  --  95.8 96.8 97.1  PLT  --  185 156 157   Cardiac Enzymes: No results found for this basename: CKTOTAL, CKMB, CKMBINDEX, TROPONINI,  in the last 168 hours BNP (last 3 results)  Recent Labs  05/26/13 2127 08/11/13 1915  PROBNP 278.5 188.8   CBG: No results found for this basename: GLUCAP,  in the last 168 hours  Recent Results (from the past 240 hour(s))  CLOSTRIDIUM DIFFICILE BY PCR     Status: None   Collection Time    08/12/13 11:47 AM      Result Value Ref Range Status   C difficile by pcr NEGATIVE  NEGATIVE Final  STOOL CULTURE  Status: None   Collection Time    08/12/13 11:47 AM      Result Value Ref Range Status   Specimen Description STOOL   Final   Special Requests NONE   Final   Culture     Final   Value: Culture reincubated for better growth     Performed at [facility]   Report Status PENDING   Incomplete  OVA AND PARASITE EXAMINATION     Status: None   Collection Time    08/12/13 11:47 AM      Result Value Ref Range Status   Specimen Description STOOL   Final   Special Requests NONE   Final   Ova and parasites     Final   Value: NO OVA OR PARASITES SEEN     Performed at [facility]   Report Status 08/13/2013 FINAL   Final     Studies: Dg Chest 2 View  08/11/2013   CLINICAL DATA Left arm weakness.  EXAM CHEST  2 VIEW  COMPARISON May 26, 2013.  FINDINGS Stable cardiomediastinal silhouette. Right-sided pacemaker is unchanged in position. Stable central pulmonary  vascular congestion is noted. No acute pulmonary disease is noted. No pneumothorax or pleural effusion is noted. Degenerative changes are noted in the thoracic spine.  IMPRESSION No acute cardiopulmonary abnormality seen.  SIGNATURE  Electronically Signed   By: Sabino Dick M.D.   On: 08/11/2013 21:42   Ct Head Wo Contrast  08/13/2013   CLINICAL DATA:  Improving left arm weakness with wrist drop, likely radial nerve palsy. Stroke risk factors include atrial fibrillation, hypertension, and coronary artery disease.  EXAM: CT HEAD WITHOUT CONTRAST  TECHNIQUE: Contiguous axial images were obtained from the base of the skull through the vertex without contrast.  COMPARISON:  CT head 08/11/2013 and 05/26/2013.  FINDINGS: Advanced atrophy with chronic microvascular ischemic change. Bilateral burr holes from chronic subdural evacuation. No acute stroke or hemorrhage. No mass lesion or hydrocephalus. Slight prominence of the extracerebral CSF spaces felt to represent atrophy rather than hygromas. Hyperostosis. Bilateral cataract extraction. No sinus or mastoid disease.  IMPRESSION: No evolving right hemisphere infarct to explain left arm weakness. No intracranial hemorrhage. Chronic changes as described.   Electronically Signed   By: Rolla Flatten M.D.   On: 08/13/2013 08:36   Ct Head Wo Contrast  08/11/2013   CLINICAL DATA Weakness with left hand grip on physical examination. Acute mental status changes earlier today, patient now back to baseline.  EXAM CT HEAD WITHOUT CONTRAST  TECHNIQUE Contiguous axial images were obtained from the base of the skull through the vertex without intravenous contrast.  COMPARISON CT HEAD W/O CM dated 05/26/2013  FINDINGS Severe cortical, deep, and cerebellar atrophy, unchanged. Mild changes of small vessel disease of the white matter, unchanged. No mass lesion. No midline shift. No acute hemorrhage or hematoma. No extra-axial fluid collections. No evidence of acute infarction. No  significant interval change.  Prior bilateral frontal burr holes. No focal osseous abnormalities elsewhere involving the skull. Opacification of the right maxillary sinus with possible erosion of the medial [place] and protrusion of sinus contents into the nasal cavity, unchanged. Remaining visualized paranasal sinuses, bilateral mastoid air cells, and bilateral middle ear cavities well-aerated.  IMPRESSION 1. No acute intracranial abnormality. 2. Stable severe generalized atrophy and mild chronic microvascular ischemic changes of the white matter. 3. Stable chronic right maxillary sinusitis. There may be erosion of the medial sinus [place] with protrusion of sinus contents  into the nasal cavity, query mucocele.  SIGNATURE  Electronically Signed   By: Evangeline Dakin M.D.   On: 08/11/2013 18:48    Scheduled Meds: . allopurinol  300 mg Oral Daily  . aspirin EC  325 mg Oral Daily  . brimonidine  1 drop Both Eyes BID  . darifenacin  15 mg Oral Daily  . diphenoxylate-atropine  2 tablet Oral QID  . heparin  5,000 Units Subcutaneous 3 times per day  . mesalamine  2.4 g Oral BID  . tamsulosin  0.4 mg Oral Daily   Continuous Infusions: . sodium chloride 100 mL/hr at 08/13/13 0536    Time spent: 35 minutes  [place] [facility] Pager [phone]. If 7PM-7AM, please contact night-coverage at [email or web address], password [facility] 08/13/2013, 3:35 PM  LOS: 2 days

## 2013-08-13 NOTE — Evaluation (Addendum)
Physical Therapy Evaluation Patient Details Name: Edward Harvey MRN: [id number] DOB: 07/09/1915 Today's Date: 08/13/2013 Time: 0109-3235 PT Time Calculation (min): 22 min  PT Assessment / Plan / Recommendation History of Present Illness  Edward Harvey  is a 80 y.o. male,  With past medical history significant for diverticulosis and history of intestinal obstruction, hemorrhage and history of coronary artery disease brought today because he couldn't extend his left wrist. His son noticed that and he reports that he has been having diarrhea for the last 2 weeks CT negative and presumed radial nerve palsy  Clinical Impression  Pt very pleasant but difficult to obtain PLOF and home environment due to pt very HOH. Pt with noted decreased left wrist extension, good grip and grossly equal shoulder ROM and strength, will defer formal assessment to OT. Pt reports fall 3 days ago from the bed and states he uses his RW all the time and has needed increased assist at times for ADLs. Pt states he typically can walk with RW on his own but currently demonstrates decreased mobility, transfers and gait who will benefit from acute therapy to maximize strength, function and gait to decrease burden of care and fall risk. Recommend OOB daily with staff.     PT Assessment  Patient needs continued PT services    Follow Up Recommendations  Home health PT;Supervision/Assistance - 24 hour    Does the patient have the potential to tolerate intense rehabilitation      Barriers to Discharge        Equipment Recommendations  None recommended by PT    Recommendations for Other Services     Frequency Min 3X/week    Precautions / Restrictions Precautions Precautions: Fall Precaution Comments: limited vision left eye   Pertinent Vitals/Pain No pain      Mobility  Bed Mobility Overal bed mobility: Needs Assistance Bed Mobility: Supine to Sit Supine to sit: Min assist General bed mobility comments: cueing  for hand placement and assist to elevate trunk from surface Transfers Overall transfer level: Needs assistance Equipment used: Rolling walker (2 wheeled) Transfers: Sit to/from Stand Sit to Stand: Min assist General transfer comment: cueing for hand placement with assist for anterior translation and cues to back fully to surface before letting go of RW and reaching for chair Ambulation/Gait Ambulation/Gait assistance: Min assist Ambulation Distance (Feet): 70 Feet Assistive device: Rolling walker (2 wheeled) Gait Pattern/deviations: Step-through pattern;Decreased stride length;Trunk flexed Gait velocity interpretation: <1.8 ft/sec, indicative of risk for recurrent falls General Gait Details: cues for posture and position in Rw as well as assist to direct RW    Exercises     PT Diagnosis: Difficulty walking;Generalized weakness  PT Problem List: Decreased strength;Decreased range of motion;Decreased activity tolerance;Decreased mobility;Decreased safety awareness;Decreased knowledge of use of DME PT Treatment Interventions: Gait training;DME instruction;Stair training;Functional mobility training;Therapeutic activities;Therapeutic exercise;Patient/family education;Neuromuscular re-education     PT Goals(Current goals can be found in the care plan section) Acute Rehab PT Goals Patient Stated Goal: return home PT Goal Formulation: With patient Time For Goal Achievement: 08/27/13 Potential to Achieve Goals: Fair  Visit Information  Last PT Received On: 08/13/13 Assistance Needed: +1 History of Present Illness: Edward Harvey  is a 80 y.o. male,  With past medical history significant for diverticulosis and history of intestinal obstruction, hemorrhage and history of coronary artery disease brought today because he couldn't extend his left wrist. His son noticed that and he reports that he has been having diarrhea for the last 2  weeks       Prior [place]  expects to be discharged to:: Private residence Living Arrangements: Children Available Help at Discharge: Family;Available 24 hours/day Type of Home: House Home Access: Stairs to enter [employer] of Steps: 2 Entrance Stairs-Rails: Right Home Layout: One level Home Equipment: Walker - 2 wheels;Bedside commode;Shower seat;Toilet riser Prior Function Level of Independence: Needs assistance Gait / Transfers Assistance Needed: Uses RW.   ADL's / Homemaking Assistance Needed: Daughter performs all homemaking and son helps with washing pt back. Pt reports he gets dressed on his own Communication / Swallowing Assistance Needed:  very HOH, limited vision Left eye Communication Communication: HOH    Cognition  Cognition Arousal/Alertness: Awake/alert Behavior During Therapy: WFL for tasks assessed/performed Overall Cognitive Status: Difficult to assess Difficult to assess due to: Hard of hearing/deaf    Extremity/Trunk Assessment Upper Extremity Assessment Upper Extremity Assessment: Defer to OT evaluation Lower Extremity Assessment Lower Extremity Assessment: Generalized weakness Cervical / Trunk Assessment Cervical / Trunk Assessment: Normal   Balance    End of Session PT - End of Session Equipment Utilized During Treatment: Gait belt Activity Tolerance: Patient tolerated treatment well Patient left: in chair;with call bell/phone within reach Nurse Communication: Mobility status  GP     Melford Aase 08/13/2013, 1:42 PM  Elwyn Reach, [place]

## 2013-08-14 DIAGNOSIS — Z923 Personal history of irradiation: Secondary | ICD-10-CM | POA: Diagnosis not present

## 2013-08-14 DIAGNOSIS — I6789 Other cerebrovascular disease: Secondary | ICD-10-CM | POA: Diagnosis not present

## 2013-08-14 DIAGNOSIS — N401 Enlarged prostate with lower urinary tract symptoms: Secondary | ICD-10-CM | POA: Diagnosis not present

## 2013-08-14 DIAGNOSIS — I10 Essential (primary) hypertension: Secondary | ICD-10-CM | POA: Diagnosis not present

## 2013-08-14 DIAGNOSIS — Z950 Presence of cardiac pacemaker: Secondary | ICD-10-CM | POA: Diagnosis not present

## 2013-08-14 DIAGNOSIS — R269 Unspecified abnormalities of gait and mobility: Secondary | ICD-10-CM | POA: Diagnosis not present

## 2013-08-14 DIAGNOSIS — R197 Diarrhea, unspecified: Secondary | ICD-10-CM | POA: Diagnosis not present

## 2013-08-14 DIAGNOSIS — N400 Enlarged prostate without lower urinary tract symptoms: Secondary | ICD-10-CM | POA: Diagnosis not present

## 2013-08-14 DIAGNOSIS — M6281 Muscle weakness (generalized): Secondary | ICD-10-CM | POA: Diagnosis not present

## 2013-08-14 DIAGNOSIS — E860 Dehydration: Secondary | ICD-10-CM | POA: Diagnosis not present

## 2013-08-14 DIAGNOSIS — H9190 Unspecified hearing loss, unspecified ear: Secondary | ICD-10-CM | POA: Diagnosis not present

## 2013-08-14 DIAGNOSIS — J984 Other disorders of lung: Secondary | ICD-10-CM | POA: Diagnosis not present

## 2013-08-14 DIAGNOSIS — I2510 Atherosclerotic heart disease of native coronary artery without angina pectoris: Secondary | ICD-10-CM | POA: Diagnosis not present

## 2013-08-14 DIAGNOSIS — R7989 Other specified abnormal findings of blood chemistry: Secondary | ICD-10-CM | POA: Diagnosis not present

## 2013-08-14 DIAGNOSIS — K922 Gastrointestinal hemorrhage, unspecified: Secondary | ICD-10-CM | POA: Diagnosis not present

## 2013-08-14 DIAGNOSIS — IMO0002 Reserved for concepts with insufficient information to code with codable children: Secondary | ICD-10-CM | POA: Diagnosis not present

## 2013-08-14 DIAGNOSIS — D72829 Elevated white blood cell count, unspecified: Secondary | ICD-10-CM | POA: Diagnosis not present

## 2013-08-14 DIAGNOSIS — I4891 Unspecified atrial fibrillation: Secondary | ICD-10-CM | POA: Diagnosis not present

## 2013-08-14 DIAGNOSIS — N179 Acute kidney failure, unspecified: Secondary | ICD-10-CM | POA: Diagnosis not present

## 2013-08-14 DIAGNOSIS — G459 Transient cerebral ischemic attack, unspecified: Secondary | ICD-10-CM | POA: Diagnosis not present

## 2013-08-14 DIAGNOSIS — N183 Chronic kidney disease, stage 3 unspecified: Secondary | ICD-10-CM | POA: Diagnosis not present

## 2013-08-14 DIAGNOSIS — Z9181 History of falling: Secondary | ICD-10-CM | POA: Diagnosis not present

## 2013-08-14 DIAGNOSIS — I959 Hypotension, unspecified: Secondary | ICD-10-CM | POA: Diagnosis not present

## 2013-08-14 DIAGNOSIS — K5010 Crohn's disease of large intestine without complications: Secondary | ICD-10-CM | POA: Diagnosis not present

## 2013-08-14 DIAGNOSIS — H409 Unspecified glaucoma: Secondary | ICD-10-CM | POA: Diagnosis not present

## 2013-08-14 DIAGNOSIS — N19 Unspecified kidney failure: Secondary | ICD-10-CM | POA: Diagnosis not present

## 2013-08-14 DIAGNOSIS — M109 Gout, unspecified: Secondary | ICD-10-CM | POA: Diagnosis not present

## 2013-08-14 DIAGNOSIS — K5289 Other specified noninfective gastroenteritis and colitis: Secondary | ICD-10-CM | POA: Diagnosis not present

## 2013-08-14 DIAGNOSIS — R630 Anorexia: Secondary | ICD-10-CM | POA: Diagnosis not present

## 2013-08-14 DIAGNOSIS — E43 Unspecified severe protein-calorie malnutrition: Secondary | ICD-10-CM | POA: Diagnosis not present

## 2013-08-14 DIAGNOSIS — D649 Anemia, unspecified: Secondary | ICD-10-CM | POA: Diagnosis not present

## 2013-08-14 DIAGNOSIS — K5090 Crohn's disease, unspecified, without complications: Secondary | ICD-10-CM | POA: Diagnosis not present

## 2013-08-14 DIAGNOSIS — R1110 Vomiting, unspecified: Secondary | ICD-10-CM | POA: Diagnosis not present

## 2013-08-14 DIAGNOSIS — R6889 Other general symptoms and signs: Secondary | ICD-10-CM | POA: Diagnosis not present

## 2013-08-14 DIAGNOSIS — R339 Retention of urine, unspecified: Secondary | ICD-10-CM | POA: Diagnosis not present

## 2013-08-14 DIAGNOSIS — Z8546 Personal history of malignant neoplasm of prostate: Secondary | ICD-10-CM | POA: Diagnosis not present

## 2013-08-14 DIAGNOSIS — K920 Hematemesis: Secondary | ICD-10-CM | POA: Diagnosis not present

## 2013-08-14 DIAGNOSIS — I442 Atrioventricular block, complete: Secondary | ICD-10-CM | POA: Diagnosis not present

## 2013-08-14 DIAGNOSIS — E861 Hypovolemia: Secondary | ICD-10-CM | POA: Diagnosis not present

## 2013-08-14 DIAGNOSIS — I129 Hypertensive chronic kidney disease with stage 1 through stage 4 chronic kidney disease, or unspecified chronic kidney disease: Secondary | ICD-10-CM | POA: Diagnosis not present

## 2013-08-14 MED ORDER — ACETAMINOPHEN 325 MG PO TABS: 650.0000 mg | ORAL_TABLET | Freq: Four times a day (QID) | ORAL | Status: DC | PRN

## 2013-08-14 MED ORDER — FUROSEMIDE 20 MG PO TABS
20.0000 mg | ORAL_TABLET | Freq: Once | ORAL | Status: AC
  Administered 2013-08-14: 20 mg via ORAL
  Filled 2013-08-14: qty 1

## 2013-08-14 MED ORDER — VITAMIN C 500 MG PO TABS: 500.0000 mg | ORAL_TABLET | Freq: Every day | ORAL | Status: AC

## 2013-08-14 MED ORDER — DIPHENOXYLATE-ATROPINE 2.5-0.025 MG PO TABS: 2.0000 | ORAL_TABLET | Freq: Four times a day (QID) | ORAL | Status: DC

## 2013-08-14 NOTE — Progress Notes (Signed)
Transport here picking up [name] to transport to [place]. Packet accompany patient with personal belongings ans upper partial plate.

## 2013-08-14 NOTE — Discharge Summary (Signed)
Physician Discharge Summary  Edward Harvey [id number] DOB: 1915-09-08 DOA: 08/11/2013  PCP: Noralee Space, MD  Admit date: 08/11/2013 Discharge date: 08/14/2013  Time spent: greater than 30 minutes  Recommendations for Outpatient Follow-up:  1. Monitor stools, adjust lomotil dosing if needed  Discharge Diagnoses:  Principal Problem:   Acute renal failure secondary to dehydration/prerenal azotemia Active Problems:   AV BLOCK, COMPLETE   Diarrhea, chronic   PPM-St.Jude   Atrial fibrillation, not a coumadin candidate due to fall risk/SDH   Crohn's colitis   Left radial neuropathy, resolving Debility Hypertension: Antihypertensives held secondary to relative hypotension History of gout Coronary artery disease History of prostate cancer status post radiation therapy in 1996 Chronic peripheral edema History of previous subdural hematoma History of subtotal colectomy Hyperkalemia  Discharge Condition: stable  Filed Weights   08/12/13 0130  Weight: 91.1 kg (200 lb 13.4 oz)    History of present illness:  80 y.o. male, With past medical history significant for diverticulosis and history of intestinal obstruction, hemorrhage and history of coronary artery disease brought today because he couldn't dorsiflex his left hand. His son noticed that and he reports that he has been having diarrhea for the last 2 weeks. Has h/o chronic diarrhea (crohns, subtotal colectomy)  He was started on Lomotil by his primary medical doctor 3 days ago and his diarrhea stopped yesterday. Last night he slept in the chair and woke up today with lower extremity edema and inability to dorsiflex his left arm and hand. CT of the head was negative for acute events. The patient was also noted to be in acute renal failure and was on quinapril and Lasix. According to the son this might have been stopped a few days ago and his Coreg has been increased.   Hospital Course:  Admitted to hospitalist service. Neurology  consulted. CAT scan showed nothing acute. He is unable to get an MRI scan due to pacemaker. Repeat CAT scan in 48 hours still showed no acute changes. His left wrist drop is resolving. Felt to be a radial nerve palsy. No further workup needed. Patient does work with physical therapy and occupational therapy  Acute renal failure resolved with IV fluids and holding Lasix and ACE inhibitor.   Crohn's colitis on lialda: Has chronic diarrhea worked up extensively in the past. C. difficile is negative again. Ova parasite exam negative. Stool culture to date is negative. He has responded well to scheduled Lomotil. Currently, he is having about one stool a day. This may be continued at skilled nursing facility.   AV BLOCK, COMPLETE/PPM-St.Jude   Atrial fibrillation, not a Coumadin candidate due to fall risk and history of subdural hematomas. Continue aspirin a day.  Hyperkalemia on admission: resolved with Kayexalate, IV fluids, holding ACE inhibitor  History of hypertension: Patient has had borderline hypotension here. His antihypertensives have been held. He is normotensive off medications. Ranging [id number] to [id number] systolic. May resume at skilled nursing facility if blood pressure increases.  Leg edema: Was on Lasix prior to admission. Doppler legs negative for DVT. Echocardiogram showed ejection fraction 50-55%. TSH 3 months ago was normal. May resume Lasix at discharge.   Debility: Initially, the family reported that they were able to provide 24-hour supervision. Upon discharge, they recanted,   and said this is not possible. He is unsafe to live alone. Apparently there is a daughter who usually cares for the father and she has been hospitalized. He would benefit from skilled nursing facility short-term for strengthening and  for safety reasons. He has been stable for several days for discharge. Will go today.   Procedures:  none  Consultations:  none  Discharge Exam: Filed Vitals:   08/14/13 0625   BP: 102/69  Pulse: 90  Temp: 98.3 F (36.8 C)  Resp: 16   General: alert, oriented. comfortable Cardiovascular: RRR without MGR Respiratory: CTA without WRR Ext: edema  Discharge Instructions  Discharge Orders   Future Appointments Provider Department Dept Phone   09/16/2013 3:45 PM Trudie Buckler, [place] [facility] [place]   Future Orders Complete By Expires   Diet - low sodium heart healthy  As directed    Walk with assistance  As directed        Medication List    STOP taking these medications       carvedilol 3.125 MG tablet  Commonly known as:  COREG     quinapril 10 MG tablet  Commonly known as:  ACCUPRIL      TAKE these medications       acetaminophen 325 MG tablet  Commonly known as:  TYLENOL  Take 2 tablets (650 mg total) by mouth every 6 (six) hours as needed for mild pain (or Fever >/= 101).     allopurinol 300 MG tablet  Commonly known as:  ZYLOPRIM  Take 300 mg by mouth every morning.     aspirin 81 MG chewable tablet  Chew 81 mg by mouth every evening.     brimonidine 0.1 % Soln  Commonly known as:  ALPHAGAN P  Place 1 drop into both eyes 2 (two) times daily.     CENTRUM SILVER PO  Take 1 tablet by mouth every morning.     diphenoxylate-atropine 2.5-0.025 MG per tablet  Commonly known as:  LOMOTIL  Take 2 tablets by mouth 4 (four) times daily. For 7 days, then 2 tab po qid prn diarrhea. Hold for constipation     furosemide 20 MG tablet  Commonly known as:  LASIX  Take 1 tablet (20 mg total) by mouth every other day.     mesalamine 1.2 G EC tablet  Commonly known as:  LIALDA  Take 2.4 g by mouth 2 (two) times daily.     SANCTURA XR 60 MG Cp24  Generic drug:  Trospium Chloride  Take 60 mg by mouth every evening.     tamsulosin 0.4 MG Caps capsule  Commonly known as:  FLOMAX  Take 0.4 mg by mouth daily after supper.     vitamin C 500 MG tablet  Commonly known as:  ASCORBIC ACID  Take 1 tablet (500 mg total)  by mouth daily.     Vitamin D 1000 UNITS capsule  Take 1,000 Units by mouth every morning.       No Known Allergies     Follow-up Information   Follow up with SNF provider next week.       The results of significant diagnostics from this hospitalization (including imaging, microbiology, ancillary and laboratory) are listed below for reference.    Significant Diagnostic Studies: Dg Chest 2 View  08/11/2013   CLINICAL DATA Left arm weakness.  EXAM CHEST  2 VIEW  COMPARISON May 26, 2013.  FINDINGS Stable cardiomediastinal silhouette. Right-sided pacemaker is unchanged in position. Stable central pulmonary vascular congestion is noted. No acute pulmonary disease is noted. No pneumothorax or pleural effusion is noted. Degenerative changes are noted in the thoracic spine.  IMPRESSION No acute cardiopulmonary abnormality seen.  SIGNATURE  Electronically Signed   By: Sabino Dick M.D.   On: 08/11/2013 21:42   Ct Head Wo Contrast  08/13/2013   CLINICAL DATA:  Improving left arm weakness with wrist drop, likely radial nerve palsy. Stroke risk factors include atrial fibrillation, hypertension, and coronary artery disease.  EXAM: CT HEAD WITHOUT CONTRAST  TECHNIQUE: Contiguous axial images were obtained from the base of the skull through the vertex without contrast.  COMPARISON:  CT head 08/11/2013 and 05/26/2013.  FINDINGS: Advanced atrophy with chronic microvascular ischemic change. Bilateral burr holes from chronic subdural evacuation. No acute stroke or hemorrhage. No mass lesion or hydrocephalus. Slight prominence of the extracerebral CSF spaces felt to represent atrophy rather than hygromas. Hyperostosis. Bilateral cataract extraction. No sinus or mastoid disease.  IMPRESSION: No evolving right hemisphere infarct to explain left arm weakness. No intracranial hemorrhage. Chronic changes as described.   Electronically Signed   By: Rolla Flatten M.D.   On: 08/13/2013 08:36   Ct Head Wo  Contrast  08/11/2013   CLINICAL DATA Weakness with left hand grip on physical examination. Acute mental status changes earlier today, patient now back to baseline.  EXAM CT HEAD WITHOUT CONTRAST  TECHNIQUE Contiguous axial images were obtained from the base of the skull through the vertex without intravenous contrast.  COMPARISON CT HEAD W/O CM dated 05/26/2013  FINDINGS Severe cortical, deep, and cerebellar atrophy, unchanged. Mild changes of small vessel disease of the white matter, unchanged. No mass lesion. No midline shift. No acute hemorrhage or hematoma. No extra-axial fluid collections. No evidence of acute infarction. No significant interval change.  Prior bilateral frontal burr holes. No focal osseous abnormalities elsewhere involving the skull. Opacification of the right maxillary sinus with possible erosion of the medial Housh and protrusion of sinus contents into the nasal cavity, unchanged. Remaining visualized paranasal sinuses, bilateral mastoid air cells, and bilateral middle ear cavities well-aerated.  IMPRESSION 1. No acute intracranial abnormality. 2. Stable severe generalized atrophy and mild chronic microvascular ischemic changes of the white matter. 3. Stable chronic right maxillary sinusitis. There may be erosion of the medial sinus Westerhoff with protrusion of sinus contents into the nasal cavity, query mucocele.  SIGNATURE  Electronically Signed   By: Evangeline Dakin M.D.   On: 08/11/2013 18:48   EKG Atrial fibrillation Ventricular pacing  Echo Left ventricle: The cavity size was normal. Icenhour thickness was increased in a pattern of mild LVH. There was mild focal basal hypertrophy of the septum. Systolic function was normal. The estimated ejection fraction was in the range of 50% to 55%. Carbone motion was normal; there were no regional Servello motion abnormalities. - Mitral valve: Mild regurgitation. - Right atrium: The atrium was mildly dilated.  Doppler legs No evidence of deep  vein or superficial thrombosis involving the right lower extremity and left lower extremity.  Microbiology: Recent Results (from the past 240 hour(s))  CLOSTRIDIUM DIFFICILE BY PCR     Status: None   Collection Time    08/12/13 11:47 AM      Result Value Ref Range Status   C difficile by pcr NEGATIVE  NEGATIVE Final  STOOL CULTURE     Status: None   Collection Time    08/12/13 11:47 AM      Result Value Ref Range Status   Specimen Description STOOL   Final   Special Requests NONE   Final   Culture     Final   Value: Culture reincubated for better growth  Performed at [facility]   Report Status PENDING   Incomplete  OVA AND PARASITE EXAMINATION     Status: None   Collection Time    08/12/13 11:47 AM      Result Value Ref Range Status   Specimen Description STOOL   Final   Special Requests NONE   Final   Ova and parasites     Final   Value: NO OVA OR PARASITES SEEN     Performed at [facility]   Report Status 08/13/2013 FINAL   Final     Labs: Basic Metabolic Panel:  Recent Labs Lab 08/11/13 1821 08/11/13 1915 08/11/13 2204 08/12/13 0705 08/13/13 0910  NA 133* 133*  --  135* 138  K 6.0* 5.0 4.8 4.6 4.2  CL 107 101  --  106 110  CO2  --  17*  --  14* 14*  GLUCOSE 97 99  --  94 99  BUN 88* 82*  --  67* 37*  CREATININE 2.50* 2.09*  --  1.71* 1.17  CALCIUM  --  9.5  --  8.8 8.4   Liver Function Tests:  Recent Labs Lab 08/11/13 1915  AST 15  ALT 13  ALKPHOS 64  BILITOT 0.8  PROT 7.3  ALBUMIN 3.0*   No results found for this basename: LIPASE, AMYLASE,  in the last 168 hours No results found for this basename: AMMONIA,  in the last 168 hours CBC:  Recent Labs Lab 08/11/13 1821 08/11/13 1915 08/12/13 0705 08/13/13 0910  WBC  --  14.3* 9.7 7.1  NEUTROABS  --  11.4*  --   --   HGB 13.3 11.3* 10.7* 10.5*  HCT 39.0 32.0* 30.3* 30.4*  MCV  --  95.8 96.8 97.1  PLT  --  185 156 157   Cardiac Enzymes: No results found for this  basename: CKTOTAL, CKMB, CKMBINDEX, TROPONINI,  in the last 168 hours BNP: BNP (last 3 results)  Recent Labs  05/26/13 2127 08/11/13 1915  PROBNP 278.5 188.8   CBG: No results found for this basename: GLUCAP,  in the last 168 hours  Signed:  Upshur L  [facility] 08/14/2013, 10:31 AM

## 2013-08-14 NOTE — Progress Notes (Signed)
Report called to Otila Kluver, RN at [facility].

## 2013-08-14 NOTE — Progress Notes (Signed)
Patient is medically stable to D/C to [facility] today. Clinical Social Worker (CSW) contacted patient's son and gave him bed offers. Son chose [facility]. Son met with [facility] admissions coordinator at [facility] today and completed paper work. Per Janie patient can go to room 605. RN called report and CSW arranged non-emergency EMS ([facility]) for transport. CSW prepared D/C packet and sent Janie D/C summary in carefinder. Please reconsult if further social work needs arise. CSW signing off.   Blima Rich, [place] Weekend CSW [phone]

## 2013-08-15 NOTE — Progress Notes (Addendum)
   CARE MANAGEMENT NOTE 08/15/2013  Patient:  Edward Harvey, Edward Harvey   Account Number:  [id number]  Date Initiated:  08/13/2013  Documentation initiated by:  Magdalen Spatz  Subjective/Objective Assessment:     Action/Plan:   Anticipated DC Date:  08/13/2013   Anticipated DC Plan:  [place]         Choice offered to / List presented to:  C-1 Patient        [place] arranged  [place].   Status of service:  Completed, signed off Medicare Important Message given?   (If response is "NO", the following Medicare IM given date fields will be blank) Date Medicare IM given:   Date Additional Medicare IM given:    Discharge Disposition:  SNF  Per UR Regulation:    If discussed at Long Length of Stay Meetings, dates discussed:    Comments:  08/15/13 CM received notification pt dc'd to [facility] 08/14/13.  No other CM needs were communicated.  Mariane Masters, BSN, [place] [phone]. 08-13-13 Spoke to patient's son Sabino Snipes JR on phone . Magdalen Spatz RN BSN [phone]

## 2013-08-16 LAB — STOOL CULTURE

## 2013-09-10 ENCOUNTER — Emergency Department (HOSPITAL_COMMUNITY): Payer: Medicare Other

## 2013-09-10 ENCOUNTER — Observation Stay (HOSPITAL_COMMUNITY)
Admission: EM | Admit: 2013-09-10 | Discharge: 2013-09-13 | Disposition: A | Discharge status: Skilled Nursing Facility | Payer: Medicare Other | Attending: Internal Medicine | Admitting: Internal Medicine

## 2013-09-10 ENCOUNTER — Encounter (HOSPITAL_COMMUNITY): Payer: Self-pay | Admitting: Emergency Medicine

## 2013-09-10 DIAGNOSIS — I2510 Atherosclerotic heart disease of native coronary artery without angina pectoris: Secondary | ICD-10-CM | POA: Insufficient documentation

## 2013-09-10 DIAGNOSIS — Z7982 Long term (current) use of aspirin: Secondary | ICD-10-CM | POA: Insufficient documentation

## 2013-09-10 DIAGNOSIS — K920 Hematemesis: Secondary | ICD-10-CM | POA: Insufficient documentation

## 2013-09-10 DIAGNOSIS — I10 Essential (primary) hypertension: Secondary | ICD-10-CM | POA: Diagnosis not present

## 2013-09-10 DIAGNOSIS — K5010 Crohn's disease of large intestine without complications: Secondary | ICD-10-CM | POA: Diagnosis not present

## 2013-09-10 DIAGNOSIS — N400 Enlarged prostate without lower urinary tract symptoms: Secondary | ICD-10-CM | POA: Diagnosis not present

## 2013-09-10 DIAGNOSIS — IMO0002 Reserved for concepts with insufficient information to code with codable children: Secondary | ICD-10-CM | POA: Insufficient documentation

## 2013-09-10 DIAGNOSIS — R112 Nausea with vomiting, unspecified: Secondary | ICD-10-CM | POA: Diagnosis present

## 2013-09-10 DIAGNOSIS — I442 Atrioventricular block, complete: Secondary | ICD-10-CM | POA: Diagnosis present

## 2013-09-10 DIAGNOSIS — Z9181 History of falling: Secondary | ICD-10-CM | POA: Insufficient documentation

## 2013-09-10 DIAGNOSIS — N179 Acute kidney failure, unspecified: Secondary | ICD-10-CM | POA: Diagnosis present

## 2013-09-10 DIAGNOSIS — N401 Enlarged prostate with lower urinary tract symptoms: Secondary | ICD-10-CM | POA: Insufficient documentation

## 2013-09-10 DIAGNOSIS — R630 Anorexia: Secondary | ICD-10-CM | POA: Insufficient documentation

## 2013-09-10 DIAGNOSIS — H9190 Unspecified hearing loss, unspecified ear: Secondary | ICD-10-CM | POA: Insufficient documentation

## 2013-09-10 DIAGNOSIS — R339 Retention of urine, unspecified: Secondary | ICD-10-CM | POA: Insufficient documentation

## 2013-09-10 DIAGNOSIS — N183 Chronic kidney disease, stage 3 unspecified: Secondary | ICD-10-CM | POA: Insufficient documentation

## 2013-09-10 DIAGNOSIS — Z923 Personal history of irradiation: Secondary | ICD-10-CM | POA: Insufficient documentation

## 2013-09-10 DIAGNOSIS — K529 Noninfective gastroenteritis and colitis, unspecified: Secondary | ICD-10-CM | POA: Diagnosis present

## 2013-09-10 DIAGNOSIS — M6281 Muscle weakness (generalized): Secondary | ICD-10-CM | POA: Insufficient documentation

## 2013-09-10 DIAGNOSIS — M109 Gout, unspecified: Secondary | ICD-10-CM | POA: Diagnosis not present

## 2013-09-10 DIAGNOSIS — Z8546 Personal history of malignant neoplasm of prostate: Secondary | ICD-10-CM | POA: Insufficient documentation

## 2013-09-10 DIAGNOSIS — E861 Hypovolemia: Principal | ICD-10-CM | POA: Diagnosis present

## 2013-09-10 DIAGNOSIS — N138 Other obstructive and reflux uropathy: Secondary | ICD-10-CM | POA: Insufficient documentation

## 2013-09-10 DIAGNOSIS — E860 Dehydration: Secondary | ICD-10-CM | POA: Diagnosis present

## 2013-09-10 DIAGNOSIS — I129 Hypertensive chronic kidney disease with stage 1 through stage 4 chronic kidney disease, or unspecified chronic kidney disease: Secondary | ICD-10-CM | POA: Insufficient documentation

## 2013-09-10 DIAGNOSIS — E43 Unspecified severe protein-calorie malnutrition: Secondary | ICD-10-CM | POA: Diagnosis present

## 2013-09-10 DIAGNOSIS — N189 Chronic kidney disease, unspecified: Secondary | ICD-10-CM

## 2013-09-10 DIAGNOSIS — I4891 Unspecified atrial fibrillation: Secondary | ICD-10-CM | POA: Insufficient documentation

## 2013-09-10 DIAGNOSIS — J984 Other disorders of lung: Secondary | ICD-10-CM | POA: Diagnosis not present

## 2013-09-10 DIAGNOSIS — D72829 Elevated white blood cell count, unspecified: Secondary | ICD-10-CM | POA: Insufficient documentation

## 2013-09-10 DIAGNOSIS — D649 Anemia, unspecified: Secondary | ICD-10-CM | POA: Insufficient documentation

## 2013-09-10 DIAGNOSIS — R1110 Vomiting, unspecified: Secondary | ICD-10-CM | POA: Diagnosis not present

## 2013-09-10 DIAGNOSIS — H409 Unspecified glaucoma: Secondary | ICD-10-CM | POA: Insufficient documentation

## 2013-09-10 DIAGNOSIS — Z950 Presence of cardiac pacemaker: Secondary | ICD-10-CM | POA: Insufficient documentation

## 2013-09-10 DIAGNOSIS — K5289 Other specified noninfective gastroenteritis and colitis: Secondary | ICD-10-CM | POA: Insufficient documentation

## 2013-09-10 LAB — CBC WITH DIFFERENTIAL/PLATELET
Basophils Absolute: 0 10*3/uL (ref 0.0–0.1)
Basophils Relative: 0 % (ref 0–1)
Eosinophils Absolute: 0 10*3/uL (ref 0.0–0.7)
Eosinophils Relative: 0 % (ref 0–5)
HCT: 33.4 % — ABNORMAL LOW (ref 39.0–52.0)
Hemoglobin: 11.6 g/dL — ABNORMAL LOW (ref 13.0–17.0)
LYMPHS ABS: 1.3 10*3/uL (ref 0.7–4.0)
Lymphocytes Relative: 11 % — ABNORMAL LOW (ref 12–46)
MCH: 35 pg — ABNORMAL HIGH (ref 26.0–34.0)
MCHC: 34.7 g/dL (ref 30.0–36.0)
MCV: 100.9 fL — AB (ref 78.0–100.0)
Monocytes Absolute: 0.8 10*3/uL (ref 0.1–1.0)
Monocytes Relative: 6 % (ref 3–12)
NEUTROS ABS: 9.8 10*3/uL — AB (ref 1.7–7.7)
NEUTROS PCT: 83 % — AB (ref 43–77)
Platelets: 169 10*3/uL (ref 150–400)
RBC: 3.31 MIL/uL — ABNORMAL LOW (ref 4.22–5.81)
RDW: 16.6 % — ABNORMAL HIGH (ref 11.5–15.5)
WBC: 12 10*3/uL — AB (ref 4.0–10.5)

## 2013-09-10 LAB — COMPREHENSIVE METABOLIC PANEL
ALK PHOS: 69 U/L (ref 39–117)
ALT: 20 U/L (ref 0–53)
AST: 27 U/L (ref 0–37)
Albumin: 2.7 g/dL — ABNORMAL LOW (ref 3.5–5.2)
BILIRUBIN TOTAL: 0.7 mg/dL (ref 0.3–1.2)
BUN: 36 mg/dL — AB (ref 6–23)
CHLORIDE: 97 meq/L (ref 96–112)
CO2: 27 mEq/L (ref 19–32)
Calcium: 9.5 mg/dL (ref 8.4–10.5)
Creatinine, Ser: 1.64 mg/dL — ABNORMAL HIGH (ref 0.50–1.35)
GFR calc non Af Amer: 34 mL/min — ABNORMAL LOW (ref >90)
GFR, EST AFRICAN AMERICAN: 39 mL/min — AB (ref >90)
Glucose, Bld: 110 mg/dL — ABNORMAL HIGH (ref 70–99)
POTASSIUM: 4.2 meq/L (ref 3.7–5.3)
SODIUM: 141 meq/L (ref 137–147)
TOTAL PROTEIN: 7.7 g/dL (ref 6.0–8.3)

## 2013-09-10 LAB — URINALYSIS, ROUTINE W REFLEX MICROSCOPIC
Glucose, UA: NEGATIVE mg/dL
HGB URINE DIPSTICK: NEGATIVE
KETONES UR: 15 mg/dL — AB
Leukocytes, UA: NEGATIVE
Nitrite: NEGATIVE
Protein, ur: NEGATIVE mg/dL
SPECIFIC GRAVITY, URINE: 1.026 (ref 1.005–1.030)
Urobilinogen, UA: 1 mg/dL (ref 0.0–1.0)
pH: 5 (ref 5.0–8.0)

## 2013-09-10 LAB — MRSA PCR SCREENING: MRSA BY PCR: NEGATIVE

## 2013-09-10 LAB — I-STAT CG4 LACTIC ACID, ED: Lactic Acid, Venous: 2.08 mmol/L (ref 0.5–2.2)

## 2013-09-10 LAB — I-STAT TROPONIN, ED: Troponin i, poc: 0.06 ng/mL (ref 0.00–0.08)

## 2013-09-10 LAB — LIPASE, BLOOD: Lipase: 27 U/L (ref 11–59)

## 2013-09-10 MED ORDER — MESALAMINE 1.2 G PO TBEC
2.4000 g | DELAYED_RELEASE_TABLET | Freq: Two times a day (BID) | ORAL | Status: DC
  Administered 2013-09-10 – 2013-09-13 (×6): 2.4 g via ORAL
  Filled 2013-09-10 (×8): qty 2

## 2013-09-10 MED ORDER — ASPIRIN 81 MG PO CHEW
81.0000 mg | CHEWABLE_TABLET | Freq: Every evening | ORAL | Status: DC
  Administered 2013-09-10 – 2013-09-12 (×3): 81 mg via ORAL
  Filled 2013-09-10 (×4): qty 1

## 2013-09-10 MED ORDER — ACETAMINOPHEN 325 MG PO TABS: 650.0000 mg | ORAL_TABLET | Freq: Four times a day (QID) | ORAL | Status: DC | PRN

## 2013-09-10 MED ORDER — SODIUM CHLORIDE 0.9 % IV SOLN
INTRAVENOUS | Status: AC
  Administered 2013-09-10 – 2013-09-11 (×2): via INTRAVENOUS

## 2013-09-10 MED ORDER — CENTRUM SILVER PO TABS: 1.0000 | ORAL_TABLET | Freq: Every day | ORAL | Status: DC

## 2013-09-10 MED ORDER — TAMSULOSIN HCL 0.4 MG PO CAPS
0.4000 mg | ORAL_CAPSULE | Freq: Every day | ORAL | Status: DC
  Administered 2013-09-10 – 2013-09-12 (×3): 0.4 mg via ORAL
  Filled 2013-09-10 (×4): qty 1

## 2013-09-10 MED ORDER — DARIFENACIN HYDROBROMIDE ER 15 MG PO TB24
15.0000 mg | ORAL_TABLET | Freq: Every evening | ORAL | Status: DC
  Administered 2013-09-10 – 2013-09-12 (×3): 15 mg via ORAL
  Filled 2013-09-10 (×4): qty 1

## 2013-09-10 MED ORDER — ENSURE COMPLETE PO LIQD
237.0000 mL | Freq: Two times a day (BID) | ORAL | Status: DC
  Administered 2013-09-11: 237 mL via ORAL

## 2013-09-10 MED ORDER — ONDANSETRON HCL 4 MG/2ML IJ SOLN
4.0000 mg | Freq: Once | INTRAMUSCULAR | Status: AC
  Administered 2013-09-10: 4 mg via INTRAVENOUS
  Filled 2013-09-10: qty 2

## 2013-09-10 MED ORDER — ONDANSETRON HCL 4 MG PO TABS: 4.0000 mg | ORAL_TABLET | Freq: Four times a day (QID) | ORAL | Status: DC | PRN

## 2013-09-10 MED ORDER — SODIUM CHLORIDE 0.9 % IV SOLN: INTRAVENOUS | Status: DC

## 2013-09-10 MED ORDER — CHLORHEXIDINE GLUCONATE 0.12 % MT SOLN
15.0000 mL | Freq: Two times a day (BID) | OROMUCOSAL | Status: DC
  Administered 2013-09-10 – 2013-09-12 (×5): 15 mL via OROMUCOSAL
  Filled 2013-09-10 (×8): qty 15

## 2013-09-10 MED ORDER — ONDANSETRON HCL 4 MG/2ML IJ SOLN: 4.0000 mg | Freq: Four times a day (QID) | INTRAMUSCULAR | Status: DC | PRN

## 2013-09-10 MED ORDER — SODIUM CHLORIDE 0.9 % IV BOLUS (SEPSIS)
500.0000 mL | Freq: Once | INTRAVENOUS | Status: AC
  Administered 2013-09-10: 500 mL via INTRAVENOUS

## 2013-09-10 MED ORDER — DIPHENOXYLATE-ATROPINE 2.5-0.025 MG PO TABS: 2.0000 | ORAL_TABLET | Freq: Four times a day (QID) | ORAL | Status: DC

## 2013-09-10 MED ORDER — DIPHENOXYLATE-ATROPINE 2.5-0.025 MG PO TABS
2.0000 | ORAL_TABLET | Freq: Four times a day (QID) | ORAL | Status: DC | PRN
  Administered 2013-09-11: 2 via ORAL
  Filled 2013-09-10: qty 2

## 2013-09-10 MED ORDER — BRIMONIDINE TARTRATE 0.2 % OP SOLN
1.0000 [drp] | Freq: Three times a day (TID) | OPHTHALMIC | Status: DC
  Administered 2013-09-10 – 2013-09-13 (×9): 1 [drp] via OPHTHALMIC
  Filled 2013-09-10 (×2): qty 5

## 2013-09-10 MED ORDER — PANTOPRAZOLE SODIUM 40 MG PO TBEC
40.0000 mg | DELAYED_RELEASE_TABLET | Freq: Every day | ORAL | Status: DC
  Administered 2013-09-11 – 2013-09-13 (×3): 40 mg via ORAL
  Filled 2013-09-10 (×4): qty 1

## 2013-09-10 MED ORDER — ADULT MULTIVITAMIN W/MINERALS CH
1.0000 | ORAL_TABLET | Freq: Every day | ORAL | Status: DC
  Administered 2013-09-11 – 2013-09-13 (×3): 1 via ORAL
  Filled 2013-09-10 (×3): qty 1

## 2013-09-10 MED ORDER — ALLOPURINOL 300 MG PO TABS
300.0000 mg | ORAL_TABLET | Freq: Every morning | ORAL | Status: DC
  Administered 2013-09-11 – 2013-09-13 (×3): 300 mg via ORAL
  Filled 2013-09-10 (×3): qty 1

## 2013-09-10 MED ORDER — TROSPIUM CHLORIDE ER 60 MG PO CP24: 60.0000 mg | ORAL_CAPSULE | Freq: Every evening | ORAL | Status: DC

## 2013-09-10 MED ORDER — BIOTENE DRY MOUTH MT LIQD
15.0000 mL | Freq: Two times a day (BID) | OROMUCOSAL | Status: DC
  Administered 2013-09-11 – 2013-09-13 (×4): 15 mL via OROMUCOSAL

## 2013-09-10 NOTE — ED Notes (Signed)
Multiple IV sticks without success.

## 2013-09-10 NOTE — Progress Notes (Signed)
INITIAL NUTRITION ASSESSMENT  DOCUMENTATION CODES Per approved criteria  -Not Applicable   INTERVENTION: Increase Ensure Complete to TID.   NUTRITION DIAGNOSIS: Unintentional weight loss related to decreased appetite as evidenced by 12% weight loss PTA.   Goal: Pt to meet >/= 90% of their estimated nutrition needs   Monitor:  PO intake, supplement acceptance, weight trend, labs   Reason for Assessment: MD Consult  80 y.o. male  Admitting Dx: Dehydration  ASSESSMENT: Pt with hx of crohn's admitted for nausea, vomiting, and dehydration for the last 3-4 days. Per MD N/V likely from gastritis/gastroenteritis.  Pt HOH. Per pt he was 215 lb 2 weeks ago. Unable to confirm current weight as pt is in the chair. Pt states that he lives at home with his daughter and has been eating 1/2 as much. No family present.   Nutrition Focused Physical Exam:  Subcutaneous Fat:  Orbital Region: WNL Upper Arm Region: WNL Thoracic and Lumbar Region: WNL  Muscle:  Temple Region: mild wasting Clavicle Bone Region: mild wasting Clavicle and Acromion Bone Region: mild wasting Scapular Bone Region: NA Dorsal Hand: WNL Patellar Region: WNL Anterior Thigh Region: WNL Posterior Calf Region: WNL  Edema: not present   Height: Ht Readings from Last 1 Encounters:  09/10/13 6' (1.829 m)    Weight: Wt Readings from Last 1 Encounters:  09/10/13 176 lb 4.8 oz (79.969 kg)    Ideal Body Weight: 80.9 kg   % Ideal Body Weight: 99%  Wt Readings from Last 10 Encounters:  09/10/13 176 lb 4.8 oz (79.969 kg)  08/12/13 200 lb 13.4 oz (91.1 kg)  08/06/13 200 lb 14.4 oz (91.128 kg)  08/04/13 208 lb (94.348 kg)  07/23/13 208 lb 3.2 oz (94.439 kg)  07/06/13 209 lb (94.802 kg)  06/02/13 207 lb 6.4 oz (94.076 kg)  05/27/13 208 lb 8.9 oz (94.6 kg)  03/26/13 209 lb 8 oz (95.029 kg)  03/24/13 215 lb (97.523 kg)    Usual Body Weight: 215 lb per pt  % Usual Body Weight: 82%  BMI:  Body mass index is  23.91 kg/(m^2).  Estimated Nutritional Needs: Kcal: 1900-2100 Protein: 90-110 grams Fluid: > 1.9 L/day  Skin: no issues noted  Diet Order: Full Liquid  EDUCATION NEEDS: -No education needs identified at this time  No intake or output data in the 24 hours ending 09/10/13 1942  Last BM: PTA   Labs:   Recent Labs Lab 09/10/13 1055  NA 141  K 4.2  CL 97  CO2 27  BUN 36*  CREATININE 1.64*  CALCIUM 9.5  GLUCOSE 110*    CBG (last 3)  No results found for this basename: GLUCAP,  in the last 72 hours  Scheduled Meds: . sodium chloride   Intravenous STAT  . [START ON 09/11/2013] allopurinol  300 mg Oral q morning - 10a  . [START ON 09/11/2013] antiseptic oral rinse  15 mL Mouth Rinse q12n4p  . aspirin  81 mg Oral QPM  . brimonidine  1 drop Both Eyes TID  . chlorhexidine  15 mL Mouth Rinse BID  . darifenacin  15 mg Oral QPM  . [START ON 09/11/2013] feeding supplement (ENSURE COMPLETE)  237 mL Oral BID BM  . mesalamine  2.4 g Oral BID  . [START ON 09/11/2013] multivitamin with minerals  1 tablet Oral Daily  . [START ON 09/11/2013] pantoprazole  40 mg Oral Q1200  . tamsulosin  0.4 mg Oral QPC supper    Continuous Infusions:  Past Medical History  Diagnosis Date  . Glaucoma   . Acute bronchitis   . Hypertension   . CAD (coronary artery disease)     nuclear stress test 2006 inferoseptal and apical infarct ejection fraction 48%  . Atrioventricular block, complete   . Diverticulosis of colon with hemorrhage   . Hemorrhage of gastrointestinal tract, unspecified   . Unspecified intestinal obstruction   . Inguinal hernia   . Gallstone   . DJD (degenerative joint disease)   . Gout   . Hematoma     subdural  . Internal hemorrhoids without mention of complication   . Pacemaker   . Prostate cancer     radiation therapy in 1996/notes 10/29/2001 (05/28/2013)  . Colitis     Past Surgical History  Procedure Laterality Date  . Pacemaker placement    . Cataract  extraction, bilateral    . Subtotal colectomy  12/03    Dr. Lucia Gaskins  . Lipoma excision  2001    left leg  . Burr hole for subdural hematoma      "he's had 4 ORs"/notes 10/29/2001 (05/28/2013)  . Insert / replace / remove pacemaker  1994; 11/2004    Archie Endo 10/29/2001; generator change/notes 11/23/2004  (05/28/2013)  . Pericardiocentesis  1994    post pacemaker placement/notes 03/12/2005 (05/28/2013)  . Tonsillectomy and adenoidectomy  age 10    03/12/2005 (05/28/2013)  . Colonoscopy  2009    Diverticulosis and Hemorrhoids     [place], [place], [place] Pager [phone] After Hours Pager

## 2013-09-10 NOTE — H&P (Signed)
[facility] History and Physical  MAYA SCHOLER MCN:[id number] DOB: 09-Sep-1915 DOA: 09/10/2013  Referring physician: Dr. Theora Gianotti PCP: Noralee Space, MD   Chief Complaint: nausea, vomiting, dehydration  HPI: Edward Harvey is a 80 y.o. male with a past medical history significant for glaucoma, hypertension, complete AV block (status post pacemaker implantation), atrial fibrillation, Crohn's disease, prostate cancer with BPH, history of internal hemorrhoids with GI bleed, history of atrial fibrillation (not on anticoagulation), history of subdural hematoma, chronic kidney disease (creatinine in 1.1-1.4 range and a stage III by GFR) and gout; came to emergency department for a skilled nursing facility secondary to nausea/vomiting and concerns for presumably GI bleed. Patient reports that for the last 4 days or so his appetite has been poor and that the fluid and water tastes bad leaving him to experience vomiting episodes. Per nursing staff and family after vomiting intermittently for the last 3-4 days patient had a mild brownish/coffee ground emesis and was really weak, reason that prompted patient to the hospital for further evaluation and treatment. Patient denies any chest pain, shortness of breath, cough, chills, fever, melena/hematochezia, abdominal pain, diarrhea or any other acute complaints. In the ED patient was found to be dehydrated, with worsening renal function, mild leukocytosis and experiencing multiple intermittent episodes of vomiting. Chest x-ray rule out any acute cardiopulmonary process; urinalysis without findings that could suggest UTI; negative lipase, normal troponins, no elevation of his lactic acid. Patient was started on IV fluids; received IV antiemetics and [facility] has been called to admit the patient for further evaluation and treatment.   Review of Systems:  Negative except otherwise mentioned on history of present illness.  Past Medical History    Diagnosis Date  . Glaucoma   . Acute bronchitis   . Hypertension   . CAD (coronary artery disease)     nuclear stress test 2006 inferoseptal and apical infarct ejection fraction 48%  . Atrioventricular block, complete   . Diverticulosis of colon with hemorrhage   . Hemorrhage of gastrointestinal tract, unspecified   . Unspecified intestinal obstruction   . Inguinal hernia   . Gallstone   . DJD (degenerative joint disease)   . Gout   . Hematoma     subdural  . Internal hemorrhoids without mention of complication   . Pacemaker   . Prostate cancer     radiation therapy in 1996/notes 10/29/2001 (05/28/2013)  . Colitis    Past Surgical History  Procedure Laterality Date  . Pacemaker placement    . Cataract extraction, bilateral    . Subtotal colectomy  12/03    Dr. Lucia Gaskins  . Lipoma excision  2001    left leg  . Burr hole for subdural hematoma      "he's had 4 ORs"/notes 10/29/2001 (05/28/2013)  . Insert / replace / remove pacemaker  1994; 11/2004    Archie Endo 10/29/2001; generator change/notes 11/23/2004  (05/28/2013)  . Pericardiocentesis  1994    post pacemaker placement/notes 03/12/2005 (05/28/2013)  . Tonsillectomy and adenoidectomy  age 83    03/12/2005 (05/28/2013)  . Colonoscopy  2009    Diverticulosis and Hemorrhoids    Social History:  reports that he has never smoked. He has never used smokeless tobacco. He reports that he does not drink alcohol or use illicit drugs.  No Known Allergies  Family history: History review. No pertinent family history reported per patient.  Prior to Admission medications   Medication Sig Start Date End Date Taking? Authorizing Provider  acetaminophen (  TYLENOL) 325 MG tablet Take 2 tablets (650 mg total) by mouth every 6 (six) hours as needed for mild pain (or Fever >/= 101). 08/14/13  Yes Delfina Redwood, MD  allopurinol (ZYLOPRIM) 300 MG tablet Take 300 mg by mouth every morning.    Yes Historical Provider, MD  aspirin 81 MG chewable  tablet Chew 81 mg by mouth every evening.    Yes Historical Provider, MD  brimonidine (ALPHAGAN P) 0.1 % SOLN Place 1 drop into both eyes 2 (two) times daily.    Yes Historical Provider, MD  Cholecalciferol (VITAMIN D) 1000 UNITS capsule Take 1,000 Units by mouth every morning.    Yes Historical Provider, MD  diphenoxylate-atropine (LOMOTIL) 2.5-0.025 MG per tablet Take 2 tablets by mouth 4 (four) times daily. For 7 days, then 2 tab po qid prn diarrhea. Hold for constipation 08/14/13  Yes Delfina Redwood, MD  furosemide (LASIX) 20 MG tablet Take 1 tablet (20 mg total) by mouth every other day. 05/29/13  Yes Barton Dubois, MD  mesalamine (LIALDA) 1.2 G EC tablet Take 2.4 g by mouth 2 (two) times daily. 08/04/13  Yes Amy S Esterwood, PA-C  Multiple Vitamins-Minerals (CENTRUM SILVER PO) Take 1 tablet by mouth every morning.    Yes Historical Provider, MD  tamsulosin (FLOMAX) 0.4 MG CAPS capsule Take 0.4 mg by mouth daily after supper.   Yes Historical Provider, MD  Trospium Chloride (SANCTURA XR) 60 MG CP24 Take 60 mg by mouth every evening.    Yes Historical Provider, MD  vitamin C (ASCORBIC ACID) 500 MG tablet Take 1 tablet (500 mg total) by mouth daily. 08/14/13  Yes Delfina Redwood, MD   Physical Exam: Filed Vitals:   09/10/13 1716  BP: 83/53  Pulse: 93  Temp: 98.5 F (36.9 C)  Resp: 16    BP 83/53  Pulse 93  Temp(Src) 98.5 F (36.9 C) (Oral)  Resp 16  Ht 6' (1.829 m)  Wt 79.969 kg (176 lb 4.8 oz)  BMI 23.91 kg/m2  SpO2 92%  General:  Appears calm and comfortable Eyes: PERRL, normal lids, irises & conjunctiva; no icterus ENT: Decrease heating (left ear more than right); dry mucous membranes, no erythema or exudates inside his mouth, poor dentition; no drainage out of his ears or nostrils  Neck: no LAD, masses or thyromegaly, no JVD Cardiovascular: RRR, no m/r/g. Trace edema bilaterally Respiratory: CTA bilaterally, no w/r/r. Normal respiratory effort. Abdomen: soft, nt, nd, no  guarding, no rebound Skin: no rash or induration seen on exam Musculoskeletal: grossly normal tone BUE/BLE; trace edema bilaterally Psychiatric: grossly normal mood and affect, speech fluent and appropriate Neurologic: grossly non-focal.          Labs on Admission:  Basic Metabolic Panel:  Recent Labs Lab 09/10/13 1055  NA 141  K 4.2  CL 97  CO2 27  GLUCOSE 110*  BUN 36*  CREATININE 1.64*  CALCIUM 9.5   Liver Function Tests:  Recent Labs Lab 09/10/13 1055  AST 27  ALT 20  ALKPHOS 69  BILITOT 0.7  PROT 7.7  ALBUMIN 2.7*    Recent Labs Lab 09/10/13 1055  LIPASE 27   CBC:  Recent Labs Lab 09/10/13 1055  WBC 12.0*  NEUTROABS 9.8*  HGB 11.6*  HCT 33.4*  MCV 100.9*  PLT 169   BNP (last 3 results)  Recent Labs  05/26/13 2127 08/11/13 1915  PROBNP 278.5 188.8   Radiological Exams on Admission: Dg Chest Port 1 View  09/10/2013  CLINICAL DATA:  Shortness of breath. Generalized weakness. Nausea and vomiting.  EXAM: PORTABLE CHEST - 1 VIEW  COMPARISON:  DG CHEST 2 VIEW dated 08/11/2013; DG CHEST 1V PORT dated 05/26/2013; DG CHEST 2 VIEW dated 09/06/2012; DG CHEST 2 VIEW dated 10/03/2011  FINDINGS: Cardiac silhouette moderately enlarged but stable. Right subclavian dual lead transvenous pacemaker unchanged. Thoracic aorta tortuous, unchanged. Hilar and mediastinal contours otherwise unremarkable. Stable scarring at the left lung base. Lungs otherwise clear. No localized airspace consolidation. No pleural effusions. No pneumothorax. Normal pulmonary vascularity. Interposition of the hepatic flexure of the colon between the liver and the right hemidiaphragm is again noted.  IMPRESSION: Stable moderate cardiomegaly. Stable left basilar scarring. No acute cardiopulmonary disease.   Electronically Signed   By: Evangeline Dakin M.D.   On: 09/10/2013 12:02    EKG:  Ventricular Rate: 90  PR Interval: 196  QRS Duration: 199  QT Interval: 479  QTC Calculation: 586  R  Axis: -86  Text Interpretation: Sinus rhythm Multiple premature complexes, vent  Nonspecific IVCD with LAD Left ventricular hypertrophy No significant  change since last tracing   Assessment/Plan 1-Dehydration: Secondary to decrease by mouth intake and ongoing nausea/vomiting. -Will provide IV fluid resuscitation -willll hold Lasix overnight and reassess bone status in the morning before continue diuretics -Will check orthostatic vital signs -Patient will be encouraged to eat and drink -Will provide as needed antiemetics.  2-HYPERTENSION: With ongoing mild hypertension. -Will check orthostatics. -Will hold Lasix -Will provide IV fluid resuscitation and will follow vital signs closely.  3-history of atrial fibrillation and AV BLOCK, COMPLETE: Patient is status ST Jude pacemaker implantation. -Currently rate control -Not a candidate for anticoagulation given history of subdural hematoma and increased risk for falling.  4-Crohn's colitis: Patient denies significant diarrhea or abdominal pain. -I doubt that his nausea/vomiting are secondary to Crohn's flare -Will check ESR -Bowel rest with full liquids and slowly advancing diet -Continue Lialda -follow clinical response  5-Acute on chronic renal failure: Creatinine 1.1-1.4 at baseline with a stage III by GFR.  Secondary to prerenal azotemia. -Will check UA. -Will hold diuretics; will provide IV fluid resuscitation -BMET in the morning to follow electrolytes and renal function  6-Nausea with vomiting: Most likely secondary to gastritis/gastroenteritis. -Will provide as needed antiemetics -Start patient on Protonix -Rule out any component from his Crohn's disease in his ongoing symptoms.  7-Severe protein-calorie malnutrition: Will start patient on ensure 3 times a day and will ask nutrition service to evaluate as well for further recommendations.  8-history of prostate cancer with BPH and urinary retention: Continue Flomax and  Sanctura    9-presumably GI bleed: Receive reports from nursing staff and family members that after patient experiencing 3-4 days of ongoing nausea and vomiting had mild brownish/coffee-ground emesis. Process is intermittent and most likely secondary to mild he weighs after been vomiting for a while. -At this moment will start patient on PPI -Stop aspirin -Avoid heparin products -Follow hemoglobin trend. -Depending clinical response and further hemoglobin levels will contact GI for further evaluation if needed  10-glaucoma: Continue Alphagan eyedrops  11-leukocytosis: Most likely secondary to stress reaction. Will hold on antibiotics at this moment and follow wbc's trend after fluid resuscitation.  DVT: SCD's  Code Status:Full Family Communication: no family at bedside Disposition Plan: admit to med-surg, observation; LOS < 2 midnights  Time spent: 37 minutes  [facility] Pager [phone]

## 2013-09-10 NOTE — ED Notes (Signed)
Patient transported by [facility] from [place] for complaint of vomiting blood x 2 days.  EMS reports dark brown colored vomitus.

## 2013-09-10 NOTE — ED Provider Notes (Signed)
CSN: [id number]     Arrival date & time 09/10/13  8786 History   First MD Initiated Contact with Patient 09/10/13 0957     Chief Complaint  Patient presents with  . GI Bleeding     (Consider location/radiation/quality/duration/timing/severity/associated sxs/prior Treatment) Patient is a 80 y.o. male presenting with vomiting. The history is provided by the patient, a relative and the nursing home.  Emesis Severity:  Moderate Duration:  4 days Timing:  Intermittent Number of daily episodes:  Usually 3 or 4 episodes a day Quality:  Stomach contents (dark brown color.  No bright red blood) Progression:  Unchanged Chronicity:  New Urine output: unknown. Relieved by:  Nothing Associated symptoms: no abdominal pain, no cough, no diarrhea, no fever and no headaches   Risk factors: no sick contacts and no travel to endemic areas   Risk factors comment:  Hx of crohn's and GI bleed 2 years ago   Past Medical History  Diagnosis Date  . Glaucoma   . Acute bronchitis   . Hypertension   . CAD (coronary artery disease)     nuclear stress test 2006 inferoseptal and apical infarct ejection fraction 48%  . Atrioventricular block, complete   . Diverticulosis of colon with hemorrhage   . Hemorrhage of gastrointestinal tract, unspecified   . Unspecified intestinal obstruction   . Inguinal hernia   . Gallstone   . DJD (degenerative joint disease)   . Gout   . Hematoma     subdural  . Internal hemorrhoids without mention of complication   . Pacemaker   . Prostate cancer     radiation therapy in 1996/notes 10/29/2001 (05/28/2013)  . Colitis    Past Surgical History  Procedure Laterality Date  . Pacemaker placement    . Cataract extraction, bilateral    . Subtotal colectomy  12/03    Dr. Lucia Gaskins  . Lipoma excision  2001    left leg  . Burr hole for subdural hematoma      "he's had 4 ORs"/notes 10/29/2001 (05/28/2013)  . Insert / replace / remove pacemaker  1994; 11/2004    Archie Endo  10/29/2001; generator change/notes 11/23/2004  (05/28/2013)  . Pericardiocentesis  1994    post pacemaker placement/notes 03/12/2005 (05/28/2013)  . Tonsillectomy and adenoidectomy  age 62    03/12/2005 (05/28/2013)  . Colonoscopy  2009    Diverticulosis and Hemorrhoids    No family history on file. History  Substance Use Topics  . Smoking status: Never Smoker   . Smokeless tobacco: Never Used  . Alcohol Use: No     Comment: Occ wine     Review of Systems  Respiratory: Negative for cough and shortness of breath.   Gastrointestinal: Positive for vomiting. Negative for abdominal pain and diarrhea.  Neurological: Negative for headaches.  All other systems reviewed and are negative.     Allergies  Review of patient's allergies indicates no known allergies.  Home Medications   Current Outpatient Rx  Name  Route  Sig  Dispense  Refill  . acetaminophen (TYLENOL) 325 MG tablet   Oral   Take 2 tablets (650 mg total) by mouth every 6 (six) hours as needed for mild pain (or Fever >/= 101).         Marland Kitchen allopurinol (ZYLOPRIM) 300 MG tablet   Oral   Take 300 mg by mouth every morning.          Marland Kitchen aspirin 81 MG chewable tablet   Oral   Chew  81 mg by mouth every evening.          . brimonidine (ALPHAGAN P) 0.1 % SOLN   Both Eyes   Place 1 drop into both eyes 2 (two) times daily.          . Cholecalciferol (VITAMIN D) 1000 UNITS capsule   Oral   Take 1,000 Units by mouth every morning.          . diphenoxylate-atropine (LOMOTIL) 2.5-0.025 MG per tablet   Oral   Take 2 tablets by mouth 4 (four) times daily. For 7 days, then 2 tab po qid prn diarrhea. Hold for constipation   30 tablet   0   . furosemide (LASIX) 20 MG tablet   Oral   Take 1 tablet (20 mg total) by mouth every other day.   90 tablet   1   . mesalamine (LIALDA) 1.2 G EC tablet   Oral   Take 2.4 g by mouth 2 (two) times daily.         . Multiple Vitamins-Minerals (CENTRUM SILVER PO)   Oral    Take 1 tablet by mouth every morning.          . tamsulosin (FLOMAX) 0.4 MG CAPS capsule   Oral   Take 0.4 mg by mouth daily after supper.         . Trospium Chloride (SANCTURA XR) 60 MG CP24   Oral   Take 60 mg by mouth every evening.          . vitamin C (ASCORBIC ACID) 500 MG tablet   Oral   Take 1 tablet (500 mg total) by mouth daily.          BP 104/69  Pulse 79  Temp(Src) 97.7 F (36.5 C) (Oral)  Resp 16  Ht 6' (1.829 m)  Wt 200 lb 13 oz (91.088 kg)  BMI 27.23 kg/m2  SpO2 94% Physical Exam  Nursing note and vitals reviewed. Constitutional: He is oriented to person, place, and time. He appears well-developed and well-nourished. No distress.  HENT:  Head: Normocephalic and atraumatic.  Mouth/Throat: Oropharynx is clear and moist.  Poor dentition but moist MM.    Eyes: Conjunctivae and EOM are normal. Pupils are equal, round, and reactive to light.  Neck: Normal range of motion. Neck supple.  Cardiovascular: Normal rate, regular rhythm and intact distal pulses.   No murmur heard. Pulmonary/Chest: Effort normal and breath sounds normal. No respiratory distress. He has no wheezes. He has no rales.  Pacer in left chest  Abdominal: Soft. He exhibits no distension. There is no tenderness. There is no rebound and no guarding.  Well healed scar in the center of the abd with active bowel sounds  Musculoskeletal: Normal range of motion. He exhibits no edema and no tenderness.  Neurological: He is alert and oriented to person, place, and time.  Skin: Skin is warm and dry. No rash noted. No erythema.  Psychiatric: He has a normal mood and affect. His behavior is normal.    ED Course  Procedures (including critical care time) Labs Review Labs Reviewed  CBC WITH DIFFERENTIAL - Abnormal; Notable for the following:    WBC 12.0 (*)    RBC 3.31 (*)    Hemoglobin 11.6 (*)    HCT 33.4 (*)    MCV 100.9 (*)    MCH 35.0 (*)    RDW 16.6 (*)    Neutrophils Relative % 83  (*)    Neutro Abs 9.8 (*)  Lymphocytes Relative 11 (*)    All other components within normal limits  COMPREHENSIVE METABOLIC PANEL - Abnormal; Notable for the following:    Glucose, Bld 110 (*)    BUN 36 (*)    Creatinine, Ser 1.64 (*)    Albumin 2.7 (*)    GFR calc non Af Amer 34 (*)    GFR calc Af Amer 39 (*)    All other components within normal limits  URINALYSIS, ROUTINE W REFLEX MICROSCOPIC - Abnormal; Notable for the following:    Color, Urine AMBER (*)    Bilirubin Urine SMALL (*)    Ketones, ur 15 (*)    All other components within normal limits  LIPASE, BLOOD  I-STAT TROPOININ, ED  I-STAT CG4 LACTIC ACID, ED   Imaging Review Dg Chest Port 1 View  09/10/2013   CLINICAL DATA:  Shortness of breath. Generalized weakness. Nausea and vomiting.  EXAM: PORTABLE CHEST - 1 VIEW  COMPARISON:  DG CHEST 2 VIEW dated 08/11/2013; DG CHEST 1V PORT dated 05/26/2013; DG CHEST 2 VIEW dated 09/06/2012; DG CHEST 2 VIEW dated 10/03/2011  FINDINGS: Cardiac silhouette moderately enlarged but stable. Right subclavian dual lead transvenous pacemaker unchanged. Thoracic aorta tortuous, unchanged. Hilar and mediastinal contours otherwise unremarkable. Stable scarring at the left lung base. Lungs otherwise clear. No localized airspace consolidation. No pleural effusions. No pneumothorax. Normal pulmonary vascularity. Interposition of the hepatic flexure of the colon between the liver and the right hemidiaphragm is again noted.  IMPRESSION: Stable moderate cardiomegaly. Stable left basilar scarring. No acute cardiopulmonary disease.   Electronically Signed   By: Evangeline Dakin M.D.   On: 09/10/2013 12:02     EKG Interpretation   Date/Time:  Friday September 10 2013 10:42:52 EDT Ventricular Rate:  90 PR Interval:  196 QRS Duration: 199 QT Interval:  479 QTC Calculation: 586 R Axis:   -86 Text Interpretation:  Sinus rhythm Multiple premature complexes, vent   Nonspecific IVCD with LAD Left ventricular  hypertrophy No significant  change since last tracing Confirmed by Maryan Rued  MD, Loree Fee ([id number]) on  09/10/2013 11:17:48 AM      MDM   Final diagnoses:  Vomiting  Dehydration   Patient coming from her rehabilitation facility with several days of vomiting. Family is at bedside and states that for the last 2 days he's vomited at least 3 or 4 times no known history of change in his bowel movements however he does have Crohn's disease and has intermittent diarrhea. Patient has no complaints except for nausea but denies abdominal tenderness, chest pain, shortness of breath. He does have a significant history for hypertension, coronary artery disease, nonspecific GI bleed 2 years ago takes 81 mg of aspirin only.  Vital signs are stable with a blood pressure 104/69 oxygen saturation 94% and a normal pulse. Patient is awake and alert and answers questions with yes and no answers. Family states that he is mentally at his baseline at this time.  We'll further evaluate for vomiting with a CBC, CMP, lipase, UA, troponin and lactic acid to ensure no sign of renal complications, UTI, pneumonia, atypical angina. Patient given IV Zofran and 500 mL bolus as family said he has not been eating for the last few days   12:07 PM Labs with mild leukocytosis of 12,000 and elevation of Cr from 1.17 to 1.6.  Lactate wnl.  uA without signs of infection.  Will admit to obs for fluids and ensure vomiting has subsided.  Blanchie Dessert, MD 09/10/13  1431 

## 2013-09-10 NOTE — Progress Notes (Signed)
Patient trasfered from 2C10 to 5W30 via stretcher; alert and oriented x 3; no complaints of pain; IV RH 4/10; skin intact. Orient patient to room and unit; gave patient care guide; instructed how to use the call bell and  fall risk precautions. Will continue to monitor the patient.

## 2013-09-11 DIAGNOSIS — E860 Dehydration: Secondary | ICD-10-CM | POA: Diagnosis not present

## 2013-09-11 DIAGNOSIS — N179 Acute kidney failure, unspecified: Secondary | ICD-10-CM | POA: Diagnosis not present

## 2013-09-11 DIAGNOSIS — E43 Unspecified severe protein-calorie malnutrition: Secondary | ICD-10-CM | POA: Diagnosis not present

## 2013-09-11 DIAGNOSIS — R1110 Vomiting, unspecified: Secondary | ICD-10-CM

## 2013-09-11 LAB — CBC
HCT: 27.2 % — ABNORMAL LOW (ref 39.0–52.0)
Hemoglobin: 9.3 g/dL — ABNORMAL LOW (ref 13.0–17.0)
MCH: 34.4 pg — ABNORMAL HIGH (ref 26.0–34.0)
MCHC: 34.2 g/dL (ref 30.0–36.0)
MCV: 100.7 fL — AB (ref 78.0–100.0)
PLATELETS: 150 10*3/uL (ref 150–400)
RBC: 2.7 MIL/uL — AB (ref 4.22–5.81)
RDW: 17 % — ABNORMAL HIGH (ref 11.5–15.5)
WBC: 9.9 10*3/uL (ref 4.0–10.5)

## 2013-09-11 LAB — BASIC METABOLIC PANEL
BUN: 37 mg/dL — ABNORMAL HIGH (ref 6–23)
CO2: 21 meq/L (ref 19–32)
Calcium: 8.6 mg/dL (ref 8.4–10.5)
Chloride: 102 mEq/L (ref 96–112)
Creatinine, Ser: 1.57 mg/dL — ABNORMAL HIGH (ref 0.50–1.35)
GFR calc Af Amer: 41 mL/min — ABNORMAL LOW (ref >90)
GFR calc non Af Amer: 36 mL/min — ABNORMAL LOW (ref >90)
GLUCOSE: 91 mg/dL (ref 70–99)
Potassium: 4.1 mEq/L (ref 3.7–5.3)
SODIUM: 141 meq/L (ref 137–147)

## 2013-09-11 LAB — SEDIMENTATION RATE: Sed Rate: 15 mm/hr (ref 0–16)

## 2013-09-11 MED ORDER — ENSURE COMPLETE PO LIQD
237.0000 mL | Freq: Three times a day (TID) | ORAL | Status: DC
  Administered 2013-09-11 – 2013-09-13 (×7): 237 mL via ORAL

## 2013-09-11 NOTE — Progress Notes (Signed)
Pt has not voided since admission to unit last night. Pt states that he doesn't feel like he needs to urinate. Preformed bladder scanner and results were 121 cc. Condom cath in place. Will pass this information to dayshift RN Zenon Mayo. Will continue to monitor pt. Ranelle Oyster, RN

## 2013-09-11 NOTE — Progress Notes (Signed)
Utilization Review Completed.  

## 2013-09-11 NOTE — Progress Notes (Signed)
[facility] PROGRESS NOTE  Edward Harvey QHU:[id number] DOB: 05/18/1916 DOA: 09/10/2013 PCP: Noralee Space, MD  Brief narrative 80 y.o. male with a past medical history significant for glaucoma, hypertension, complete AV block (status post pacemaker implantation), atrial fibrillation, Crohn's disease, prostate cancer with BPH, history of internal hemorrhoids with GI bleed, history of atrial fibrillation (not on anticoagulation), history of subdural hematoma, chronic kidney disease (creatinine in 1.1-1.4 range) and gout; brought  to emergency department for a skilled nursing facility secondary to nausea/vomiting and concerns for ? GI bleed.  Assessment/Plan: Acute dehydration Secondary to ongoing nausea and vomiting and poor by mouth intake. possibly underlying gastroenteritis. Unlikely of crohns flare. Continue IV fluids and Prn antiemetics. Hold Lasix. Renal function slowly improving. Advance diet as tolerated.   Hx of A. fib Status post pacemaker for complete heart block. Rate controlled. -Not a candidate for anticoagulation given history of subdural hematoma and increased risk for falling.  Hx of chron's colitis Stable. continue lialda.  AKI on CKD Baseline creatinine of 1.1-1.4. Likely secondary to prerenal azotemia. Holding diuretics and  monitor with IV fluids. UA unremarkable   ? Hematemesis Denies any symptoms further. Noted for a slight drop in hb this am. Has hx of gastric ulcer. Will monitor   Severe protein-calorie malnutrition:  Started on ensure tid. nutrition consulted  history of prostate cancer with BPH and urinary retention:  Continue Flomax and Sanctura  Code Status: full code  Family Communication: son at bedside Disposition Plan: return to SNF on 4/13 if stable   Consultants:  none  Procedures:  none  Antibiotics:  none  HPI/Subjective: Patient seen and examined this morning. Denies any nausea or vomiting. Overnight issues. Denies any  hematemesis  Objective: Filed Vitals:   09/11/13 0359  BP: 107/70  Pulse: 82  Temp: 97.5 F (36.4 C)  Resp: 16    Intake/Output Summary (Last 24 hours) at 09/11/13 1348 Last data filed at 09/11/13 0651  Gross per 24 hour  Intake   1200 ml  Output      0 ml  Net   1200 ml   Filed Weights   09/10/13 1001 09/10/13 1716  Weight: 91.088 kg (200 lb 13 oz) 79.969 kg (176 lb 4.8 oz)    Exam:   General:  Elderly male lying in bed in no acute distress. Hard of hearing  HEENT: No pallor, moist oral mucosa  Chest: Clear to auscultation bilaterally, no added sounds  CVS: Normal S1-S2, no murmurs or gallop  Abdomen: Soft: non distended, nontender, bowel sounds present  Extremities: Warm, no edema  CNS: AAO x3  Data Reviewed: Basic Metabolic Panel:  Recent Labs Lab 09/10/13 1055 09/11/13 0640  NA 141 141  K 4.2 4.1  CL 97 102  CO2 27 21  GLUCOSE 110* 91  BUN 36* 37*  CREATININE 1.64* 1.57*  CALCIUM 9.5 8.6   Liver Function Tests:  Recent Labs Lab 09/10/13 1055  AST 27  ALT 20  ALKPHOS 69  BILITOT 0.7  PROT 7.7  ALBUMIN 2.7*    Recent Labs Lab 09/10/13 1055  LIPASE 27   No results found for this basename: AMMONIA,  in the last 168 hours CBC:  Recent Labs Lab 09/10/13 1055 09/11/13 0640  WBC 12.0* 9.9  NEUTROABS 9.8*  --   HGB 11.6* 9.3*  HCT 33.4* 27.2*  MCV 100.9* 100.7*  PLT 169 150   Cardiac Enzymes: No results found for this basename: CKTOTAL, CKMB, CKMBINDEX, TROPONINI,  in the  last 168 hours BNP (last 3 results)  Recent Labs  05/26/13 2127 08/11/13 1915  PROBNP 278.5 188.8   CBG: No results found for this basename: GLUCAP,  in the last 168 hours  Recent Results (from the past 240 hour(s))  MRSA PCR SCREENING     Status: None   Collection Time    09/10/13  5:14 PM      Result Value Ref Range Status   MRSA by PCR NEGATIVE  NEGATIVE Final   Comment:            The GeneXpert MRSA Assay (FDA     approved for NASAL  specimens     only), is one component of a     comprehensive MRSA colonization     surveillance program. It is not     intended to diagnose MRSA     infection nor to guide or     monitor treatment for     MRSA infections.     Studies: Dg Chest Port 1 View  09/10/2013   CLINICAL DATA:  Shortness of breath. Generalized weakness. Nausea and vomiting.  EXAM: PORTABLE CHEST - 1 VIEW  COMPARISON:  DG CHEST 2 VIEW dated 08/11/2013; DG CHEST 1V PORT dated 05/26/2013; DG CHEST 2 VIEW dated 09/06/2012; DG CHEST 2 VIEW dated 10/03/2011  FINDINGS: Cardiac silhouette moderately enlarged but stable. Right subclavian dual lead transvenous pacemaker unchanged. Thoracic aorta tortuous, unchanged. Hilar and mediastinal contours otherwise unremarkable. Stable scarring at the left lung base. Lungs otherwise clear. No localized airspace consolidation. No pleural effusions. No pneumothorax. Normal pulmonary vascularity. Interposition of the hepatic flexure of the colon between the liver and the right hemidiaphragm is again noted.  IMPRESSION: Stable moderate cardiomegaly. Stable left basilar scarring. No acute cardiopulmonary disease.   Electronically Signed   By: Evangeline Dakin M.D.   On: 09/10/2013 12:02    Scheduled Meds: . [COMPLETED] sodium chloride   Intravenous STAT  . allopurinol  300 mg Oral q morning - 10a  . antiseptic oral rinse  15 mL Mouth Rinse q12n4p  . aspirin  81 mg Oral QPM  . brimonidine  1 drop Both Eyes TID  . chlorhexidine  15 mL Mouth Rinse BID  . darifenacin  15 mg Oral QPM  . feeding supplement (ENSURE COMPLETE)  237 mL Oral TID BM  . mesalamine  2.4 g Oral BID  . multivitamin with minerals  1 tablet Oral Daily  . pantoprazole  40 mg Oral Q1200  . tamsulosin  0.4 mg Oral QPC supper   Continuous Infusions:     Time spent: 25 minutes    [name]  [facility] Pager [phone]. If 7PM-7AM, please contact night-coverage at [email or web address], password [facility] 09/11/2013, 1:48  PM  LOS: 1 day

## 2013-09-11 NOTE — Evaluation (Signed)
Physical Therapy Evaluation Patient Details Name: Edward Harvey MRN: [id number] DOB: 02/08/1916 Today's Date: 09/11/2013   History of Present Illness  Edward Harvey is a 80 y.o. male with a past medical history significant for glaucoma, hypertension, complete AV block (status post pacemaker implantation), atrial fibrillation, Crohn's disease, prostate cancer with BPH, history of internal hemorrhoids with GI bleed, history of atrial fibrillation (not on anticoagulation), history of subdural hematoma, chronic kidney disease (creatinine in 1.1-1.4 range and a stage III by GFR) and gout; came to emergency department for a skilled nursing facility secondary to nausea/vomiting and concerns for presumably GI bleed.  pt dehydrated and generalized weakness.   Clinical Impression  Pt adm due to the above. Presents with decreased independence with mobility secondary to deficits indicated below. Pt to benefit from skilled acute PT to address deficits and maximize functional mobility prior to D/C back to SNF ([facility]). Spoke with son; pt at [facility] for rehab and plans to return upon acute D/C.    Follow Up Recommendations SNF;Supervision/Assistance - 24 hour    Equipment Recommendations  None recommended by PT    Recommendations for Other Services       Precautions / Restrictions Precautions Precautions: Fall Restrictions Weight Bearing Restrictions: No      Mobility  Bed Mobility Overal bed mobility: Needs Assistance Bed Mobility: Supine to Sit     Supine to sit: Mod assist;HOB elevated     General bed mobility comments: (A) to elevate trunk to sitting position; pt able to advance LEs off ed with incr time and max cues; use of handrails and HOB elevated  Transfers Overall transfer level: Needs assistance Equipment used: Rolling walker (2 wheeled) Transfers: Sit to/from [facility] Sit to Stand: Mod assist;From elevated surface Stand pivot transfers: Mod  assist;From elevated surface       General transfer comment: pt required (A) to clear bed and elevate hips; cues for hand placement and sequencin with RW; pt tends to lean anteriorly when attempting to transfer and demo decr safety awareness with RW; mod (A) to maintain balance and facilitate weightshift and pivotal steps into chair   Ambulation/Gait             General Gait Details: will require 2 person (A) for ambulation at this time  Stairs            Wheelchair Mobility    Modified Rankin (Stroke Patients Only)       Balance Overall balance assessment: Needs assistance Sitting-balance support: Feet supported;Bilateral upper extremity supported Sitting balance-Leahy Scale: Poor Sitting balance - Comments: posterior lean with UEs supported; no LOB Postural control: Posterior lean Standing balance support: During functional activity;Bilateral upper extremity supported Standing balance-Leahy Scale: Poor Standing balance comment: mod (A) and bil UEs supporting pt                              Pertinent Vitals/Pain No c/o pain    Home Living Family/patient expects to be discharged to:: Skilled nursing facility                 Additional Comments: Pt from SNF; unable to give name of SNF at this time     Prior Function Level of Independence: Needs assistance   Gait / Transfers Assistance Needed: pt reports he ambulates SNF with RW   ADL's / Homemaking Assistance Needed: needs (A)   Comments: spoke with son on phone; pt from  [facility] and plans to D/C back there      Hand Dominance        Extremity/Trunk Assessment   Upper Extremity Assessment: Defer to OT evaluation           Lower Extremity Assessment: Generalized weakness      Cervical / Trunk Assessment: Kyphotic  Communication   Communication: HOH  Cognition Arousal/Alertness: Awake/alert Behavior During Therapy: Flat affect Overall Cognitive Status: No  family/caregiver present to determine baseline cognitive functioning Area of Impairment: Orientation;Safety/judgement;Following commands;Problem solving Orientation Level: Disoriented to;Place   Memory: Decreased short-term memory Following Commands: Follows one step commands with increased time Safety/Judgement: Decreased awareness of deficits;Decreased awareness of safety   Problem Solving: Slow processing General Comments: no family present to determine baseline     General Comments      Exercises General Exercises - Lower Extremity Ankle Circles/Pumps: AROM;Strengthening;Both;10 reps;Seated      Assessment/Plan    PT Assessment Patient needs continued PT services  PT Diagnosis Difficulty walking;Generalized weakness   PT Problem List Decreased strength;Decreased activity tolerance;Decreased balance;Decreased mobility;Decreased cognition;Decreased knowledge of use of DME;Decreased safety awareness;Decreased knowledge of precautions  PT Treatment Interventions DME instruction;Gait training;Functional mobility training;Therapeutic activities;Therapeutic exercise;Balance training;Neuromuscular re-education;Patient/family education   PT Goals (Current goals can be found in the Care Plan section) Acute Rehab PT Goals Patient Stated Goal: to walk with my walker  PT Goal Formulation: With patient Time For Goal Achievement: 09/25/13 Potential to Achieve Goals: Good    Frequency Min 2X/week   Barriers to discharge        Co-evaluation               End of Session Equipment Utilized During Treatment: Gait belt Activity Tolerance: Patient tolerated treatment well Patient left: in chair;with call bell/phone within reach;with chair alarm set Nurse Communication: Mobility status;Precautions    Functional Assessment Tool Used: clinical judgement Functional Limitation: Mobility: Walking and moving around Mobility: Walking and Moving Around Current Status [phone]): At least  40 percent but less than 60 percent impaired, limited or restricted Mobility: Walking and Moving Around Goal Status [phone]): At least 1 percent but less than 20 percent impaired, limited or restricted    Time: 0935-0952 PT Time Calculation (min): 17 min   Charges:   PT Evaluation $Initial PT Evaluation Tier I: 1 Procedure PT Treatments $Therapeutic Activity: 8-22 mins   PT G Codes:   Functional Assessment Tool Used: clinical judgement Functional Limitation: Mobility: Walking and moving around    [place], [place] [phone] 09/11/2013, 12:16 PM

## 2013-09-12 DIAGNOSIS — N179 Acute kidney failure, unspecified: Secondary | ICD-10-CM | POA: Diagnosis not present

## 2013-09-12 DIAGNOSIS — K5289 Other specified noninfective gastroenteritis and colitis: Secondary | ICD-10-CM | POA: Diagnosis not present

## 2013-09-12 DIAGNOSIS — K529 Noninfective gastroenteritis and colitis, unspecified: Secondary | ICD-10-CM | POA: Diagnosis present

## 2013-09-12 DIAGNOSIS — E860 Dehydration: Secondary | ICD-10-CM | POA: Diagnosis not present

## 2013-09-12 DIAGNOSIS — N189 Chronic kidney disease, unspecified: Secondary | ICD-10-CM | POA: Diagnosis not present

## 2013-09-12 LAB — BASIC METABOLIC PANEL
BUN: 32 mg/dL — AB (ref 6–23)
CHLORIDE: 106 meq/L (ref 96–112)
CO2: 14 meq/L — AB (ref 19–32)
CREATININE: 1.18 mg/dL (ref 0.50–1.35)
Calcium: 8.2 mg/dL — ABNORMAL LOW (ref 8.4–10.5)
GFR calc non Af Amer: 50 mL/min — ABNORMAL LOW (ref >90)
GFR, EST AFRICAN AMERICAN: 58 mL/min — AB (ref >90)
Glucose, Bld: 116 mg/dL — ABNORMAL HIGH (ref 70–99)
Potassium: 4 mEq/L (ref 3.7–5.3)
Sodium: 137 mEq/L (ref 137–147)

## 2013-09-12 LAB — CBC
HCT: 26.3 % — ABNORMAL LOW (ref 39.0–52.0)
Hemoglobin: 8.9 g/dL — ABNORMAL LOW (ref 13.0–17.0)
MCH: 34.9 pg — ABNORMAL HIGH (ref 26.0–34.0)
MCHC: 33.8 g/dL (ref 30.0–36.0)
MCV: 103.1 fL — AB (ref 78.0–100.0)
Platelets: 138 10*3/uL — ABNORMAL LOW (ref 150–400)
RBC: 2.55 MIL/uL — ABNORMAL LOW (ref 4.22–5.81)
RDW: 17 % — AB (ref 11.5–15.5)
WBC: 8.4 10*3/uL (ref 4.0–10.5)

## 2013-09-12 NOTE — Progress Notes (Signed)
[facility] PROGRESS NOTE  KIEV LABROSSE BJS:[id number] DOB: 1915-08-12 DOA: 09/10/2013 PCP: Noralee Space, MD  Brief narrative 80 y.o. male with a past medical history significant for glaucoma, hypertension, complete AV block (status post pacemaker implantation), atrial fibrillation, Crohn's disease, prostate cancer with BPH, history of internal hemorrhoids with GI bleed, history of atrial fibrillation (not on anticoagulation), history of subdural hematoma, chronic kidney disease (creatinine in 1.1-1.4 range) and gout; brought  to emergency department for a skilled nursing facility secondary to nausea/vomiting and concerns for ? GI bleed.  Assessment/Plan: Acute dehydration Secondary to ongoing nausea and vomiting and poor by mouth intake. possibly underlying gastroenteritis. Unlikely of crohns flare. Continue IV fluids and Prn antiemetics. No further symptoms. Hold Lasix. Renal function improved. Advance diet as tolerated.   Hx of A. fib Status post pacemaker for complete heart block. Rate controlled. -Not a candidate for anticoagulation given history of subdural hematoma and increased risk for falling.  Hx of chron's colitis Stable. continue lialda.  AKI on CKD Baseline creatinine of 1.1-1.4. Likely secondary to prerenal azotemia. Holding diuretics . Improved with IV fluids. UA unremarkable   ? Hematemesis Denies any symptoms further. Noted for a slight drop in hb . Has hx of gastric ulcer. Will monitor for now.  Severe protein-calorie malnutrition:  Started on ensure tid. nutrition consulted  history of prostate cancer with BPH and urinary retention:  Continue Flomax and Sanctura  Code Status: full code  Family Communication: son at bedside Disposition Plan: return to SNF tomorrow if stable   Consultants:  none  Procedures:  none  Antibiotics:  none  HPI/Subjective: Patient seen and examined this morning. Denies any nausea, vomiting or hematemesis. No  overnight sisues Objective: Filed Vitals:   09/12/13 0444  BP: 94/61  Pulse: 83  Temp: 97.6 F (36.4 C)  Resp: 16    Intake/Output Summary (Last 24 hours) at 09/12/13 1231 Last data filed at 09/12/13 0920  Gross per 24 hour  Intake     50 ml  Output    500 ml  Net   -450 ml   Filed Weights   09/10/13 1001 09/10/13 1716  Weight: 91.088 kg (200 lb 13 oz) 79.969 kg (176 lb 4.8 oz)    Exam:   General:  Elderly male lying in bed in no acute distress. Hard of hearing  HEENT: No pallor, moist oral mucosa  Chest: Clear to auscultation bilaterally, no added sounds  CVS: Normal S1-S2, no murmurs or gallop  Abdomen: Soft: non distended, nontender, bowel sounds present  Extremities: Warm, no edema  CNS: AAO x3  Data Reviewed: Basic Metabolic Panel:  Recent Labs Lab 09/10/13 1055 09/11/13 0640 09/12/13 0648  NA 141 141 137  K 4.2 4.1 4.0  CL 97 102 106  CO2 27 21 14*  GLUCOSE 110* 91 116*  BUN 36* 37* 32*  CREATININE 1.64* 1.57* 1.18  CALCIUM 9.5 8.6 8.2*   Liver Function Tests:  Recent Labs Lab 09/10/13 1055  AST 27  ALT 20  ALKPHOS 69  BILITOT 0.7  PROT 7.7  ALBUMIN 2.7*    Recent Labs Lab 09/10/13 1055  LIPASE 27   No results found for this basename: AMMONIA,  in the last 168 hours CBC:  Recent Labs Lab 09/10/13 1055 09/11/13 0640 09/12/13 0648  WBC 12.0* 9.9 8.4  NEUTROABS 9.8*  --   --   HGB 11.6* 9.3* 8.9*  HCT 33.4* 27.2* 26.3*  MCV 100.9* 100.7* 103.1*  PLT 169 150  138*   Cardiac Enzymes: No results found for this basename: CKTOTAL, CKMB, CKMBINDEX, TROPONINI,  in the last 168 hours BNP (last 3 results)  Recent Labs  05/26/13 2127 08/11/13 1915  PROBNP 278.5 188.8   CBG: No results found for this basename: GLUCAP,  in the last 168 hours  Recent Results (from the past 240 hour(s))  MRSA PCR SCREENING     Status: None   Collection Time    09/10/13  5:14 PM      Result Value Ref Range Status   MRSA by PCR NEGATIVE   NEGATIVE Final   Comment:            The GeneXpert MRSA Assay (FDA     approved for NASAL specimens     only), is one component of a     comprehensive MRSA colonization     surveillance program. It is not     intended to diagnose MRSA     infection nor to guide or     monitor treatment for     MRSA infections.     Studies: No results found.  Scheduled Meds: . allopurinol  300 mg Oral q morning - 10a  . antiseptic oral rinse  15 mL Mouth Rinse q12n4p  . aspirin  81 mg Oral QPM  . brimonidine  1 drop Both Eyes TID  . chlorhexidine  15 mL Mouth Rinse BID  . darifenacin  15 mg Oral QPM  . feeding supplement (ENSURE COMPLETE)  237 mL Oral TID BM  . mesalamine  2.4 g Oral BID  . multivitamin with minerals  1 tablet Oral Daily  . pantoprazole  40 mg Oral Q1200  . tamsulosin  0.4 mg Oral QPC supper   Continuous Infusions:     Time spent: 25 minutes    Veryl Abril  [facility] Pager [phone]. If 7PM-7AM, please contact night-coverage at [email or web address], password [facility] 09/12/2013, 12:31 PM  LOS: 2 days

## 2013-09-13 DIAGNOSIS — N171 Acute kidney failure with acute cortical necrosis: Secondary | ICD-10-CM | POA: Diagnosis not present

## 2013-09-13 DIAGNOSIS — I4891 Unspecified atrial fibrillation: Secondary | ICD-10-CM | POA: Diagnosis not present

## 2013-09-13 DIAGNOSIS — K5289 Other specified noninfective gastroenteritis and colitis: Secondary | ICD-10-CM | POA: Diagnosis not present

## 2013-09-13 DIAGNOSIS — N179 Acute kidney failure, unspecified: Secondary | ICD-10-CM | POA: Diagnosis not present

## 2013-09-13 DIAGNOSIS — E46 Unspecified protein-calorie malnutrition: Secondary | ICD-10-CM | POA: Diagnosis not present

## 2013-09-13 DIAGNOSIS — D649 Anemia, unspecified: Secondary | ICD-10-CM | POA: Diagnosis not present

## 2013-09-13 DIAGNOSIS — K5090 Crohn's disease, unspecified, without complications: Secondary | ICD-10-CM | POA: Diagnosis not present

## 2013-09-13 DIAGNOSIS — M6281 Muscle weakness (generalized): Secondary | ICD-10-CM | POA: Diagnosis not present

## 2013-09-13 DIAGNOSIS — D509 Iron deficiency anemia, unspecified: Secondary | ICD-10-CM | POA: Diagnosis not present

## 2013-09-13 DIAGNOSIS — N400 Enlarged prostate without lower urinary tract symptoms: Secondary | ICD-10-CM | POA: Diagnosis not present

## 2013-09-13 DIAGNOSIS — R269 Unspecified abnormalities of gait and mobility: Secondary | ICD-10-CM | POA: Diagnosis not present

## 2013-09-13 DIAGNOSIS — R279 Unspecified lack of coordination: Secondary | ICD-10-CM | POA: Diagnosis not present

## 2013-09-13 DIAGNOSIS — R112 Nausea with vomiting, unspecified: Secondary | ICD-10-CM | POA: Diagnosis not present

## 2013-09-13 DIAGNOSIS — N189 Chronic kidney disease, unspecified: Secondary | ICD-10-CM | POA: Diagnosis not present

## 2013-09-13 DIAGNOSIS — K5229 Other allergic and dietetic gastroenteritis and colitis: Secondary | ICD-10-CM | POA: Diagnosis not present

## 2013-09-13 DIAGNOSIS — E875 Hyperkalemia: Secondary | ICD-10-CM | POA: Diagnosis not present

## 2013-09-13 DIAGNOSIS — K922 Gastrointestinal hemorrhage, unspecified: Secondary | ICD-10-CM | POA: Diagnosis not present

## 2013-09-13 DIAGNOSIS — E860 Dehydration: Secondary | ICD-10-CM | POA: Diagnosis not present

## 2013-09-13 LAB — CBC
HCT: 26.1 % — ABNORMAL LOW (ref 39.0–52.0)
Hemoglobin: 8.9 g/dL — ABNORMAL LOW (ref 13.0–17.0)
MCH: 34.9 pg — AB (ref 26.0–34.0)
MCHC: 34.1 g/dL (ref 30.0–36.0)
MCV: 102.4 fL — ABNORMAL HIGH (ref 78.0–100.0)
PLATELETS: 128 10*3/uL — AB (ref 150–400)
RBC: 2.55 MIL/uL — ABNORMAL LOW (ref 4.22–5.81)
RDW: 17.1 % — ABNORMAL HIGH (ref 11.5–15.5)
WBC: 8.1 10*3/uL (ref 4.0–10.5)

## 2013-09-13 LAB — BASIC METABOLIC PANEL
BUN: 24 mg/dL — ABNORMAL HIGH (ref 6–23)
CALCIUM: 8.3 mg/dL — AB (ref 8.4–10.5)
CO2: 20 mEq/L (ref 19–32)
Chloride: 106 mEq/L (ref 96–112)
Creatinine, Ser: 1 mg/dL (ref 0.50–1.35)
GFR calc Af Amer: 71 mL/min — ABNORMAL LOW (ref >90)
GFR, EST NON AFRICAN AMERICAN: 61 mL/min — AB (ref >90)
Glucose, Bld: 80 mg/dL (ref 70–99)
Potassium: 4.2 mEq/L (ref 3.7–5.3)
SODIUM: 140 meq/L (ref 137–147)

## 2013-09-13 MED ORDER — PANTOPRAZOLE SODIUM 40 MG PO TBEC: 40.0000 mg | DELAYED_RELEASE_TABLET | Freq: Every day | ORAL | Status: DC

## 2013-09-13 MED ORDER — ENSURE COMPLETE PO LIQD: 237.0000 mL | Freq: Three times a day (TID) | ORAL | Status: DC

## 2013-09-13 NOTE — Progress Notes (Signed)
Clinical Social Work Department BRIEF PSYCHOSOCIAL ASSESSMENT 09/13/2013  Patient:  Edward Harvey, Edward Harvey     Account Number:  [id number]     Admit date:  09/10/2013  Clinical Social Worker:  Freeman Caldron  Date/Time:  09/13/2013 02:37 PM  Referred by:  Physician  Date Referred:  09/13/2013 Referred for  SNF Placement   Other Referral:   Interview type:  Other - See comment Other interview type:   Call from [place]:  FACILITY Admitted from facility:  [place] Level of care:  [place] Primary support name:  Ismar Yabut. Primary support relationship to patient:  CHILD, ADULT Degree of support available:   Good--family involved in pt care.    CURRENT CONCERNS Current Concerns  Post-Acute Placement   Other Concerns:    SOCIAL WORK ASSESSMENT / PLAN CSW received consult that pt is from [facility]. Called facility, confirmed they can take pt back. Facility states family is aware, as they completed admissions paperwork today. Sent discharge summary to facility and confirmed PTAR pick-up for 4pm with RN.   Assessment/plan status:  No Further Intervention Required Other assessment/ plan:   Information/referral to community resources:   [facility] return    PATIENT'S/FAMILY'S RESPONSE TO PLAN OF CARE: N/A       Ky Barban, MSW, [facility] Clinical Social Worker [phone]

## 2013-09-13 NOTE — Progress Notes (Signed)
Pt discharged back to [facility]. Skin intact, IV d/ced, no complaints of pain. Packet sent with patient in ambulance. Edward Harvey

## 2013-09-13 NOTE — Care Management Note (Signed)
    Page 1 of 1   09/13/2013     2:34:06 PM   CARE MANAGEMENT NOTE 09/13/2013  Patient:  Edward Harvey, Edward Harvey   Account Number:  [id number]  Date Initiated:  09/13/2013  Documentation initiated by:  Tomi Bamberger  Subjective/Objective Assessment:   dx dehydration  admit as observsation. FRom [facility] SNF.     Action/Plan:   Anticipated DC Date:  09/13/2013   Anticipated DC Plan:  SKILLED NURSING FACILITY  In-house referral  Clinical Social Worker      DC Planning Services  CM consult      Choice offered to / List presented to:             Status of service:  Completed, signed off Medicare Important Message given?   (If response is "NO", the following Medicare IM given date fields will be blank) Date Medicare IM given:   Date Additional Medicare IM given:    Discharge Disposition:  [place]  Per UR Regulation:  Reviewed for med. necessity/level of care/duration of stay  If discussed at [place] of Stay Meetings, dates discussed:    Comments:

## 2013-09-13 NOTE — Discharge Summary (Signed)
Physician Discharge Summary  Edward Harvey UXN:[id number] DOB: [birth year] DOA: 09/10/2013  PCP: Noralee Space, MD  Admit date: 09/10/2013 Discharge date: 09/13/2013  Time spent: 25 minutes  Recommendations for Outpatient Follow-up:  1. D/c to SNF  Discharge Diagnoses:  Principal Problem:   Hypovolemia dehydration  Active Problems:   Acute gastroenteritis   HYPERTENSION   Crohn's colitis   Dehydration   Acute on chronic renal failure   Nausea with vomiting   Severe protein-calorie malnutrition   BPH (benign prostatic hyperplasia)   Anemia   Discharge Condition: FAIR  Diet recommendation: regular  Filed Weights   09/10/13 1001 09/10/13 1716  Weight: 91.088 kg (200 lb 13 oz) 79.969 kg (176 lb 4.8 oz)    History of present illness:   80 y.o. male with a past medical history significant for glaucoma, hypertension, complete AV block (status post pacemaker implantation), atrial fibrillation, Crohn's disease, prostate cancer with BPH, history of internal hemorrhoids with GI bleed, history of atrial fibrillation (not on anticoagulation), history of subdural hematoma, chronic kidney disease (creatinine in 1.1-1.4 range) and gout; brought to emergency department for a skilled nursing facility secondary to nausea/vomiting and concerns for ? GI bleed.  Hospital Course:  Acute dehydration  Secondary to ongoing nausea and vomiting and poor by mouth intake. possibly underlying gastroenteritis. Unlikely of crohn's flare.  -patient given IV fluids and Prn antiemetics. No further symptoms. Held Lasix. Renal function improved.  -tolerating advanced diet.  Hx of A. fib  Status post pacemaker for complete heart block. Rate controlled.  -Not a candidate for anticoagulation given history of subdural hematoma and increased risk for falling.   Hx of chron's colitis  Stable. continue lialda.   AKI on CKD  Baseline creatinine of 1.1-1.4. Likely secondary to prerenal azotemia. Holding  diuretics . Improved with IV fluids. UA unremarkable   ? Hematemesis  Reported some hematemesis upon admission. Denies any symptoms further. Noted for a slight drop in hb but stable. Marland Kitchen Has hx of gastric ulcer. Follow H&h in 1 week.   Severe protein-calorie malnutrition:  Started on ensure tid. nutrition consulted   history of prostate cancer with BPH and urinary retention:  Continue Flomax and Sanctura   patient stable to be discharged back  to SNF   Code Status: full code  Family Communication: discussed with daughter over the phone  Disposition Plan: return to SNF   Consultants:  None   Procedures:  None   Antibiotics:  none   Discharge Exam: Filed Vitals:   09/13/13 0513  BP: 95/58  Pulse: 72  Temp: 97.8 F (36.6 C)  Resp: 20    General: Elderly male lying in bed in no acute distress. Hard of hearing  HEENT: No pallor, moist oral mucosa  Chest: Clear to auscultation bilaterally, no added sounds  CVS: Normal S1-S2, no murmurs or gallop  Abdomen: Soft: non distended, nontender, bowel sounds present  Extremities: Warm, no edema  CNS: AAO x3   Discharge Instructions You were cared for by a hospitalist during your hospital stay. If you have any questions about your discharge medications or the care you received while you were in the hospital after you are discharged, you can call the unit and asked to speak with the hospitalist on call if the hospitalist that took care of you is not available. Once you are discharged, your primary care physician will handle any further medical issues. Please note that NO REFILLS for any discharge medications will be authorized once you  are discharged, as it is imperative that you return to your primary care physician (or establish a relationship with a primary care physician if you do not have one) for your aftercare needs so that they can reassess your need for medications and monitor your lab values.   Future Appointments  Provider Department Dept Phone   09/16/2013 3:45 PM Trudie Buckler, [facility]       Medication List         acetaminophen 325 MG tablet  Commonly known as:  TYLENOL  Take 2 tablets (650 mg total) by mouth every 6 (six) hours as needed for mild pain (or Fever >/= 101).     allopurinol 300 MG tablet  Commonly known as:  ZYLOPRIM  Take 300 mg by mouth every morning.     aspirin 81 MG chewable tablet  Chew 81 mg by mouth every evening.     brimonidine 0.1 % Soln  Commonly known as:  ALPHAGAN P  Place 1 drop into both eyes 2 (two) times daily.     CENTRUM SILVER PO  Take 1 tablet by mouth every morning.     diphenoxylate-atropine 2.5-0.025 MG per tablet  Commonly known as:  LOMOTIL  Take 2 tablets by mouth 4 (four) times daily. For 7 days, then 2 tab po qid prn diarrhea. Hold for constipation     feeding supplement (ENSURE COMPLETE) Liqd  Take 237 mLs by mouth 3 (three) times daily between meals.     furosemide 20 MG tablet  Commonly known as:  LASIX  Take 1 tablet (20 mg total) by mouth every other day.     mesalamine 1.2 G EC tablet  Commonly known as:  LIALDA  Take 2.4 g by mouth 2 (two) times daily.     pantoprazole 40 MG tablet  Commonly known as:  PROTONIX  Take 1 tablet (40 mg total) by mouth daily at 12 noon.     SANCTURA XR 60 MG Cp24  Generic drug:  Trospium Chloride  Take 60 mg by mouth every evening.     tamsulosin 0.4 MG Caps capsule  Commonly known as:  FLOMAX  Take 0.4 mg by mouth daily after supper.     vitamin C 500 MG tablet  Commonly known as:  ASCORBIC ACID  Take 1 tablet (500 mg total) by mouth daily.     Vitamin D 1000 UNITS capsule  Take 1,000 Units by mouth every morning.       No Known Allergies     Follow-up Information   Please follow up. (follow up at SNF in 1 week)        The results of significant diagnostics from this hospitalization (including imaging, microbiology, ancillary and  laboratory) are listed below for reference.    Significant Diagnostic Studies: Dg Chest Port 1 View  09/10/2013   CLINICAL DATA:  Shortness of breath. Generalized weakness. Nausea and vomiting.  EXAM: PORTABLE CHEST - 1 VIEW  COMPARISON:  DG CHEST 2 VIEW dated 08/11/2013; DG CHEST 1V PORT dated 05/26/2013; DG CHEST 2 VIEW dated 09/06/2012; DG CHEST 2 VIEW dated 10/03/2011  FINDINGS: Cardiac silhouette moderately enlarged but stable. Right subclavian dual lead transvenous pacemaker unchanged. Thoracic aorta tortuous, unchanged. Hilar and mediastinal contours otherwise unremarkable. Stable scarring at the left lung base. Lungs otherwise clear. No localized airspace consolidation. No pleural effusions. No pneumothorax. Normal pulmonary vascularity. Interposition of the hepatic flexure of the colon between the liver and the right  hemidiaphragm is again noted.  IMPRESSION: Stable moderate cardiomegaly. Stable left basilar scarring. No acute cardiopulmonary disease.   Electronically Signed   By: Evangeline Dakin M.D.   On: 09/10/2013 12:02    Microbiology: Recent Results (from the past 240 hour(s))  MRSA PCR SCREENING     Status: None   Collection Time    09/10/13  5:14 PM      Result Value Ref Range Status   MRSA by PCR NEGATIVE  NEGATIVE Final   Comment:            The GeneXpert MRSA Assay (FDA     approved for NASAL specimens     only), is one component of a     comprehensive MRSA colonization     surveillance program. It is not     intended to diagnose MRSA     infection nor to guide or     monitor treatment for     MRSA infections.     Labs: Basic Metabolic Panel:  Recent Labs Lab 09/10/13 1055 09/11/13 0640 09/12/13 0648 09/13/13 0717  NA 141 141 137 140  K 4.2 4.1 4.0 4.2  CL 97 102 106 106  CO2 27 21 14* 20  GLUCOSE 110* 91 116* 80  BUN 36* 37* 32* 24*  CREATININE 1.64* 1.57* 1.18 1.00  CALCIUM 9.5 8.6 8.2* 8.3*   Liver Function Tests:  Recent Labs Lab 09/10/13 1055  AST  27  ALT 20  ALKPHOS 69  BILITOT 0.7  PROT 7.7  ALBUMIN 2.7*    Recent Labs Lab 09/10/13 1055  LIPASE 27   No results found for this basename: AMMONIA,  in the last 168 hours CBC:  Recent Labs Lab 09/10/13 1055 09/11/13 0640 09/12/13 0648 09/13/13 0710  WBC 12.0* 9.9 8.4 8.1  NEUTROABS 9.8*  --   --   --   HGB 11.6* 9.3* 8.9* 8.9*  HCT 33.4* 27.2* 26.3* 26.1*  MCV 100.9* 100.7* 103.1* 102.4*  PLT 169 150 138* 128*   Cardiac Enzymes: No results found for this basename: CKTOTAL, CKMB, CKMBINDEX, TROPONINI,  in the last 168 hours BNP: BNP (last 3 results)  Recent Labs  05/26/13 2127 08/11/13 1915  PROBNP 278.5 188.8   CBG: No results found for this basename: GLUCAP,  in the last 168 hours     Signed:  Emogene Muratalla  [facility] 09/13/2013, 12:30 PM

## 2013-09-15 DIAGNOSIS — D509 Iron deficiency anemia, unspecified: Secondary | ICD-10-CM | POA: Diagnosis not present

## 2013-09-15 DIAGNOSIS — E46 Unspecified protein-calorie malnutrition: Secondary | ICD-10-CM | POA: Diagnosis not present

## 2013-09-15 DIAGNOSIS — E875 Hyperkalemia: Secondary | ICD-10-CM | POA: Diagnosis not present

## 2013-09-15 DIAGNOSIS — I4891 Unspecified atrial fibrillation: Secondary | ICD-10-CM | POA: Diagnosis not present

## 2013-09-15 DIAGNOSIS — N171 Acute kidney failure with acute cortical necrosis: Secondary | ICD-10-CM | POA: Diagnosis not present

## 2013-09-15 DIAGNOSIS — E860 Dehydration: Secondary | ICD-10-CM | POA: Diagnosis not present

## 2013-09-15 DIAGNOSIS — K5229 Other allergic and dietetic gastroenteritis and colitis: Secondary | ICD-10-CM | POA: Diagnosis not present

## 2013-09-15 DIAGNOSIS — R112 Nausea with vomiting, unspecified: Secondary | ICD-10-CM | POA: Diagnosis not present

## 2013-09-16 ENCOUNTER — Ambulatory Visit: Payer: Medicare Other | Admitting: Podiatrist

## 2013-10-20 ENCOUNTER — Telehealth: Payer: Self-pay | Admitting: Pulmonary Disease

## 2013-10-20 NOTE — Telephone Encounter (Signed)
Called spoke with pt Son. appt scheduled for 11/04/13. Nothing further needed

## 2013-11-04 ENCOUNTER — Other Ambulatory Visit (INDEPENDENT_AMBULATORY_CARE_PROVIDER_SITE_OTHER): Payer: Medicare Other

## 2013-11-04 ENCOUNTER — Ambulatory Visit (INDEPENDENT_AMBULATORY_CARE_PROVIDER_SITE_OTHER): Payer: Medicare Other | Admitting: Pulmonary Disease

## 2013-11-04 ENCOUNTER — Encounter: Payer: Self-pay | Admitting: Pulmonary Disease

## 2013-11-04 VITALS — BP 102/68 | HR 60 | Temp 97.3°F | Ht 73.0 in | Wt 198.4 lb

## 2013-11-04 DIAGNOSIS — M109 Gout, unspecified: Secondary | ICD-10-CM

## 2013-11-04 DIAGNOSIS — I2510 Atherosclerotic heart disease of native coronary artery without angina pectoris: Secondary | ICD-10-CM | POA: Diagnosis not present

## 2013-11-04 DIAGNOSIS — I442 Atrioventricular block, complete: Secondary | ICD-10-CM

## 2013-11-04 DIAGNOSIS — J209 Acute bronchitis, unspecified: Secondary | ICD-10-CM

## 2013-11-04 DIAGNOSIS — F0390 Unspecified dementia without behavioral disturbance: Secondary | ICD-10-CM

## 2013-11-04 DIAGNOSIS — N171 Acute kidney failure with acute cortical necrosis: Secondary | ICD-10-CM | POA: Diagnosis not present

## 2013-11-04 DIAGNOSIS — K5090 Crohn's disease, unspecified, without complications: Secondary | ICD-10-CM | POA: Diagnosis not present

## 2013-11-04 DIAGNOSIS — R627 Adult failure to thrive: Secondary | ICD-10-CM | POA: Diagnosis not present

## 2013-11-04 DIAGNOSIS — I10 Essential (primary) hypertension: Secondary | ICD-10-CM | POA: Diagnosis not present

## 2013-11-04 DIAGNOSIS — Z950 Presence of cardiac pacemaker: Secondary | ICD-10-CM

## 2013-11-04 DIAGNOSIS — E43 Unspecified severe protein-calorie malnutrition: Secondary | ICD-10-CM

## 2013-11-04 DIAGNOSIS — K5010 Crohn's disease of large intestine without complications: Secondary | ICD-10-CM

## 2013-11-04 DIAGNOSIS — M1990 Unspecified osteoarthritis, unspecified site: Secondary | ICD-10-CM

## 2013-11-04 DIAGNOSIS — K5731 Diverticulosis of large intestine without perforation or abscess with bleeding: Secondary | ICD-10-CM

## 2013-11-04 DIAGNOSIS — Z8546 Personal history of malignant neoplasm of prostate: Secondary | ICD-10-CM

## 2013-11-04 DIAGNOSIS — E875 Hyperkalemia: Secondary | ICD-10-CM | POA: Diagnosis not present

## 2013-11-04 DIAGNOSIS — R269 Unspecified abnormalities of gait and mobility: Secondary | ICD-10-CM

## 2013-11-04 DIAGNOSIS — R609 Edema, unspecified: Secondary | ICD-10-CM

## 2013-11-04 DIAGNOSIS — Z87828 Personal history of other (healed) physical injury and trauma: Secondary | ICD-10-CM

## 2013-11-04 DIAGNOSIS — I4891 Unspecified atrial fibrillation: Secondary | ICD-10-CM | POA: Diagnosis not present

## 2013-11-04 DIAGNOSIS — I872 Venous insufficiency (chronic) (peripheral): Secondary | ICD-10-CM

## 2013-11-04 LAB — CBC WITH DIFFERENTIAL/PLATELET
Basophils Absolute: 0 10*3/uL (ref 0.0–0.1)
Basophils Relative: 0.4 % (ref 0.0–3.0)
EOS ABS: 0.2 10*3/uL (ref 0.0–0.7)
EOS PCT: 3.1 % (ref 0.0–5.0)
HCT: 32.9 % — ABNORMAL LOW (ref 39.0–52.0)
Hemoglobin: 10.6 g/dL — ABNORMAL LOW (ref 13.0–17.0)
LYMPHS PCT: 24.3 % (ref 12.0–46.0)
Lymphs Abs: 1.7 10*3/uL (ref 0.7–4.0)
MCHC: 32.3 g/dL (ref 30.0–36.0)
MCV: 104.9 fl — ABNORMAL HIGH (ref 78.0–100.0)
Monocytes Absolute: 0.7 10*3/uL (ref 0.1–1.0)
Monocytes Relative: 10.7 % (ref 3.0–12.0)
NEUTROS PCT: 61.5 % (ref 43.0–77.0)
Neutro Abs: 4.3 10*3/uL (ref 1.4–7.7)
Platelets: 168 10*3/uL (ref 150.0–400.0)
RBC: 3.14 Mil/uL — AB (ref 4.22–5.81)
RDW: 17.1 % — ABNORMAL HIGH (ref 11.5–15.5)
WBC: 6.9 10*3/uL (ref 4.0–10.5)

## 2013-11-04 LAB — BASIC METABOLIC PANEL
BUN: 12 mg/dL (ref 6–23)
CO2: 26 meq/L (ref 19–32)
Calcium: 8.7 mg/dL (ref 8.4–10.5)
Chloride: 109 mEq/L (ref 96–112)
Creatinine, Ser: 0.9 mg/dL (ref 0.4–1.5)
GFR: 100.54 mL/min (ref >60.00)
Glucose, Bld: 87 mg/dL (ref 70–99)
Potassium: 3.7 mEq/L (ref 3.5–5.1)
Sodium: 139 mEq/L (ref 135–145)

## 2013-11-04 LAB — IBC PANEL
IRON: 70 ug/dL (ref 42–165)
Saturation Ratios: 29.6 % (ref 20.0–50.0)
Transferrin: 168.7 mg/dL — ABNORMAL LOW (ref 212.0–360.0)

## 2013-11-04 LAB — HEPATIC FUNCTION PANEL
ALT: 12 U/L (ref 0–53)
AST: 14 U/L (ref 0–37)
Albumin: 2.6 g/dL — ABNORMAL LOW (ref 3.5–5.2)
Alkaline Phosphatase: 63 U/L (ref 39–117)
Bilirubin, Direct: 0.2 mg/dL (ref 0.0–0.3)
TOTAL PROTEIN: 6.2 g/dL (ref 6.0–8.3)
Total Bilirubin: 0.9 mg/dL (ref 0.2–1.2)

## 2013-11-04 LAB — URIC ACID: Uric Acid, Serum: 4.4 mg/dL (ref 4.0–7.8)

## 2013-11-04 LAB — VITAMIN B12: VITAMIN B 12: 680 pg/mL (ref 211–911)

## 2013-11-04 NOTE — Patient Instructions (Signed)
Today we updated your med list in our EPIC system...    Continue your current medications the same for now...  Today we checked your follow up blood work...    We will contact you w/ the results when available...   We also completed the [facility] form for aid and attendance at your request...  Call for any questions...  Let's plan a follow up visit in 6-8 weeks, sooner if needed for problems.Marland KitchenMarland Kitchen

## 2013-11-04 NOTE — Progress Notes (Signed)
Subjective:    Patient ID: Edward Harvey, male    DOB: February 11, 1916, 80 y.o.   MRN: [id number]  HPI 80 y/o BM here for a follow up visit... he has multiple medical problems including HBP, CAD, & complete heart block w/ pacer followed by DrWall & DrKlein;  hx of GI bleed from divertics w/ subtotal colectomy in 2003 & followed by DrPatterson;  hx SBO, inguinal hernias, gallstones;  hx prostate cancer followed by DrOttelin;  DJD & Gout;  hx subdural hematoma prev requiring burr hole, & mild senile dementia... ~  SEE PREV EPIC NOTES FOR OLDER DATA >>   ~  June 09, 2102:  34mo ROV & Edward Harvey indicates he had some diarrhea, saw DrPatterson & now better w/ addition of Lialda 1.2gmBid for Crohn's (but he thinks it was salads); We reviewed the following medical problems during today's office visit>>     AB> no recent URI or breathing prob reported...    HBP> on ASA81, Coreg6.25Bid, Quinapril20, Lasix20; wt is up 8# on diet to 225#; BP= 118/70...    CAD/ AVblock/ Pacer> on meds above; he denies CP, palpit, ch in dyspnea or edema...    Divertics w/ hx hemorrhage/ Hx SBO> he denies abd pain, N/V/C; recent diarrhea improved w/ Lialda.    Hx Prostate Cancer> followed by DrOttelin but we don't have recent notes...     DJD/ Gout> on Allopurinol300, OTC analgesics prn & MVI/ Vit D supplement;     Hx subdural hematoma> required burr holes back then; no known sequellae & he hasn't been falling... We reviewed prob list, meds, xrays and labs> see below for updates >>   LABS 1/14:  Chems- wnl w/ Creat=1.4 Alb=3.0;  CBC- ok w/ Hg=11.9;  TSH=1.76;  Sed=38... rec attn to diet & continue Lialda.  ~  September 08, 2012:  75mo ROV & post hosp check> He was [facility] 4/6-7/14 after he got too hot in church he says; DCSummary indicates syncope assoc w/ BUN~25, Cr~1.4, Hg~10, CXR- NAD; given IVF & improved, no postural BP changes; he knows to avoid sodium & is taking Lasix20/d... Feels he is back to baseline- wants to restart his PT [facility]- ok... We reviewed the following medical problems during today's office visit >>     AB> no recent URI or breathing prob reported...    HBP> on ASA81, Coreg6.25Bid, Quinapril20, Lasix20; wt is down 1# on diet to 224#; BP= 140/78 & he feels he is stable...    CAD/ AVblock/ Pacer> on meds above; he denies CP, palpit, ch in dyspnea or edema...    Divertics w/ hx hemorrhage/ Hx SBO> he denies abd pain, N/V/C; recent diarrhea improved w/ Lialda1.2Bid    Hx Prostate Cancer> on Flomax0.4; followed by DrOttelin but we don't have recent notes...     DJD/ Gout> on Allopurinol300, OTC analgesics prn & MVI/ Vit D supplement;     Hx subdural hematoma> required burr holes back then; no known sequellae & he hasn't been falling... We reviewed prob list, meds, xrays and labs> see below for updates >>   ~  December 08, 2012:  31mo ROV & Edward Harvey has lost 5# down to 219# & he has less edema despite cutting his Lasix to 20mg /d last OV; he notes incr urinary freq during the day (on Flomax0.4) but denies nocturia! We discussed low sodium & incr exercise...  He saw DrWall 7/14 for f/u CAD, heart block, pacer- doing satis & pacer check is ok; he will f/u  w/ DrKlein in 40mo; BP= 116/72 on his Coreg6.25Bid, Accupril20, & Lasix20;  His son says that he was c/o some diarrhea but pt denies to me & remains on Lialda 1.2gmBid...     We reviewed prob list, meds, xrays and labs> see below for updates >>   LABS 7/14:  Chems- ok w/ Cr=1.3;  CBC- okw/ Hg=11.7  ~  March 24, 2013:  3-96mo ROV & Edward Harvey CC is abd discomfort that comes and goes- burning, diarrhea, etc; he is followed by [facility] w/ divertics, hx Crohn's colitis w/ colonic bleed requiring subtotal colectomy from the ileum to the sigmoid in 2003, SBO 2011, Bilat inguinal hernias and gallstones; he takes Lialda1.2gmBid, AnusolHC cream, and Lomotil vs Miralax prn (he has GI f/u planned soon)...  He is still getting PT at home one day per week... Breathing has been OK,  he denies CP & cardiac stable... Followed by DrOttelin for Urology    We reviewed prob list, meds, xrays and labs> see below for updates >> he had the 2014 Flu vaccine in Oct...   ~  July 23, 2013:  12mo ROV & post hosp visit> he was Adm 12/24 - 05/29/13 by [facility] after being found unresponsive in his chair, family called 911 & he came around but was hypotensive & brought to ER, he had been having diarrhea & was dehydrated (Cr=1.7=>1.0), no infection found, BP meds were adjusted down; he has Hx Crohn's dis & controlled on Lialda1.2gmBid and Lomotil added for prn use; he has pacer & this checked out OK... We reviewed the following medical problems during today's office visit >>     AB> no recent URI or breathing prob reported; he has not required breathing meds...    HBP> on ASA81, Coreg3.125- taking ?1or2 Bid, Quinapril10, Lasix20; wt is down 3# on diet to 208#; BP= 124/70 & he feels he is stable...    CAD/ AVblock/ Pacer> on meds above; he denies CP, palpit, ch in dyspnea, but he has some pedal edema after the hydration...    Ven Insuffi, Edema>  Reminded to elim salt, elevate legs, wear support hose, & take the Lasix20 daily...    Divertics w/ hx hemorrhage/ Hx SBO/ segmental colitis w/ diarrhea> he denies abd pain, N/V/C; recent diarrhea improved w/ Lialda1.2Bid & lomotil prn; he saw DrPatterson 2/15 & stable...    Hx Prostate Cancer> on Flomax0.4 & Sanctura 60 added yest; followed by DrOttelin but we don't have recent notes...     DJD/ Gout> on Allopurinol300, OTC analgesics prn & MVI/ Vit D supplement;     Hx subdural hematoma, senile dementia> required burr holes back then; no known sequellae & he hasn't been falling...    Derm> Podiatry trims his mycotically infected toenails We reviewed the following medical problems during today's office visit >> THEY DID NOT BRING MED BOTTLES TO THE OV TODAY...  CXR 12/14 showed cardiomeg/ vasc congestion/ interstitial edema, basilar atx, right sided  pacer, R>L glenohumeral arthritis...  EKG 12/14 showed pacer rhythm, rate72, no change...  CT Head 12/14 showed NAD, atrophy, bilat burr holes, and right max sinus dis...   LABS 12/14 reviewed> Chems- ok w/ Cr improved from 1.7 => 1.0 w/ hydration;  CBC- ok but Hg~11;  TSH=0.63...  ~  November 04, 2013:  3-75mo ROV & post hosp check>  Edward Harvey was [facility] x2 3/15 and 4/15 by [facility] w/ dehydration & renal insuffic; he was disch to [facility] w/ med adjustments & he remains there (still getting therapy &  needs help w/ ADLs) but they are thinking of taking him home> we discussed the need for 24/7 coverage, can't be alone, & needs to be able to get up & about w/ walker etc; they request Aid & Attendance from the [facility] for assistance (form completed per their request &  scanned into epic...     AB> no recent URI or breathing prob reported; he has not required breathing meds...    HBP> on ASA81, Lasix20Qod & off Coreg & Quinapril; wt is down 10# on diet to 198#; BP= 102/68 & he feels weak=> rec nutritional supplements...     CAD/ AVblock/ Pacer> on meds above; he denies CP, palpit, ch in dyspnea, but he has some pedal edema after the hydration...    Ven Insuffi, Edema>  Reminded to elim salt, elevate legs, wear support hose, & take the Lasix20 qod...    Divertics w/ hx hemorrhage/ Hx SBO/ segmental colitis w/ diarrhea> on Protonix40; he denies abd pain, N/V/C; recent diarrhea improved w/ Lialda1.2Bid & lomotil prn; he saw DrPatterson 2/15 & stable...    Hx Prostate Cancer> on Flomax0.4 & Sanctura60; followed by DrOttelin but we don't have recent notes...     DJD/ Gout> on Allopurinol300, OTC analgesics prn & MVI/ Vit D supplement;     Hx subdural hematoma, senile dementia> required burr holes back then; no known sequellae & he hasn't been falling...    Derm> Podiatry trims his mycotically infected toenails We reviewed prob list, meds, xrays and labs> see below for updates >>   LABS 6/15:  Chems- wnl x Alb=2.6 &  Ca=8.7;  CBC= anemic w/ Hg=10.6, MCV=105, Fe=70 (30%sat), B12=680;  Uric=4.4 on Allopurinol300...  REC:  MVI, nutrtional supplements Bid...          Problem List:    GLAUCOMA (ICD-365.9) - prev followed by [facility] w/ bilat cataract surg & glaucoma on eye 3 diff drops as noted...  HEARING LOSS - son says he has 3 sets of hearing aides but won't wear any of them...  Hx of ASTHMATIC BRONCHITIS, ACUTE (ICD-466.0) - he has never smoked... hx recurrent bronchitic infections over the yrs w/ reactive airway component... no recent prob, and no regular meds required.  ~  CXR 5/13 showed stable hrt size & pacer, clear lungs, degen changes in Tspine, NAD.Marland Kitchen. ~  CXR 4/14 showed heart at upper lim normal, totuous Ao, pacer, clear lungs/ stable scarring at left base/ NAD.Marland Kitchen. ~  CXR 12/14 showed cardiomeg/ vasc congestion/ interstitial edema, basilar atx, right sided pacer, R>L glenohumeral arthritis ~  CXR Mar & Apr 2015 in hosp showed stable cardiomeg, pacer, tortuous ao, scarring left base, colon above liver on the right, NAD...  HYPERTENSION (ICD-401.9) >>  ~  on ASA 81mg /d, COREG 6.25Bid, QUINAPRIL 20mg Bid, LASIX 20mg /d... He takes his own meds & states no problems. ~  4/12:  BP= 110/74 & feeling well... he denies HA, visual changes, CP, palipit, dizziness, syncope, change in dyspnea, etc... ~  8/12:  BP= 110/68 & continues stable... ~  12/12:  BP= 116/78 & he remains asymptomatic... ~  5/13:  BP= 126/80 & he continues to deny CP, palpit, SOB, edema, etc... ~  9/13:  BP= 126/78 & he denies CP, palpit, ch in SOB or edema... ~  10/13:  BP= 102/66 & he remains largely asymptomatic... ~  1/14:  BP= 118/70 & he continues to deny CP, palpit, ch in SOB/ edema... ~  4/14: on ASA81, Coreg6.25Bid, Quinapril20, Lasix20; wt is down 1# on  diet to 224#; BP= 140/78 & he feels he is stable. ~  7/14: on ASA81, Coreg6.25Bid, Quinapril20, Lasix20; weight is down 5# to 219# & BP= 116/72 ~  2/15: on ASA81, Coreg3.125-  1or2Bid, Quinapril10, Lasix20; weight is down further to 208# today; BP= 124/70...  CORONARY ARTERY DISEASE (ICD-414.00) - followed by DrWall for Cardiology on above meds... pt has not had a prev cath... ~  2DEcho 4/05 showed mild conc LVH and norm LVF- improved from 2002 when EF= 45%... ~  NuclearStressTest 10/06 showed prior inferoseptal & apical infarct w/ apical AK, w/o ischemia, EF= 48%... no change from prev. ~  EKG 7/14 showed dual paced rhythm... ~  EKG 12/14: pacer rhythm, rate72, no change... ~  2DEcho 3/15 showed mild LVH, mild focal basilar hypertrophy, norm LVF w/ EF=50-55%, norm Godek motion, mild MR, mild RA dil...   Hx of AV BLOCK, COMPLETE (ICD-426.0), & CARDIAC PACEMAKER IN SITU (ICD-V45.01) - he had a pacemaker placed for complete heart block initially in 1994, & this was exchanged for a new dual chamber pacer: Joylene Draft DDDR model [phone] by [facility] 6/06... ~  seen by DrKlein 1/10 w/ pacer check OK, no further episodes of AFib on his monitor... not a Coumadin candidate due to age, unsteady, hx of GI bleeds, and prev subdural hematoma. ~  followed regularly by DrKlein's pacer clinic (last note 10/12 reviewed)> stable, doing well, no changes made.  DIVERTICULOSIS OF COLON WITH HEMORRHAGE (ICD-562.12)  GASTROINTESTINAL HEMORRHAGE (ICD-578.9) - he had a subtotal colectomy w/ ileum to distal sigmoid anastomosis in Jul03 by DrNewman... Hx SEGMENTAL COLITIS >> see below  ~  EGD 8/03 by Demetra Shiner was normal... ~  colonoscopy 1/08 by DrPatterson showed inflammed mucosa in sm bowel prox to the ileo-colonic anastomosis, few divertics in remaining distal colon, no polyps etc...  ~  f/u colonoscopy 5/09 showed prev colectomy- anastomosis granular & bleeding ?Crohn's... also had divertics & hems... Rx'd w/ Lialda.  CROHN'S COLITIS >>  Hx of SMALL BOWEL OBSTRUCTION (ICD-560.9) > see [id number] Hospitalization by [facility]... ~  Abd Sonar 4/11 showed mult gallstones in GB, echogenic renal parenchyma  c/w medical renal dis... ~  CT Abd 4/11 showed mild atx at bases, sl GB Roepke thickening & ductal dilatation (known gallstones), mult cysts in spleen & kidneys, ectatic ao, bilat fat filled inguinal hernias, degen changes in spine... ~  12/13: he had f/u DrPatterson & note reviewed> hx segmental colitis assoc w/ severe diverticulosis & prev left colon resection; restarted LIALDA 1.2gm Bid for diarrhea & improved... ~  8/14: he had f/u DrPatterson w/ hx segmental colitis and divertics, occas loose stools, on Lialda2.4gm/d and rec to take benefiber sprinkled on food daily; ok to use immodium or lomotil as needed...  ~  2/15: he had f/u DrPatterson> hx diverticulosis & a segmental colitis treated w/ Lialda1.2gmBid & prn Lomotil; diarrhea has resolved, stool was neg for blood, hx subtotal colectomy because of recurrent diverticulitis- last sigmoid exam in 2008 showed severe segmental colitis versus Crohn's disease at his anastomotic areas ( path showed severe ileitis and colitis most consistent with inflammatory bowel disease)...   INGUINAL HERNIA (ICD-550.90) - he has bilat fat containing inguinal hernias seen on CT Abd in 2008...   GALLSTONES (ICD-574.20) - Sonar 4/11 showed mult gallstones filling the gallbladder, and CT Abd 4/11 showed mild extrahep biliary ductal dilatation... he was evaluated by DrPatterson/ GI, DrNewman/ Surg, DrWall/ Cards> all agreed that risk was hi & to hold off on surg unless absolutely necessary... ~  4/12 & 8/12:  He remains asymptomatic w/o abd pain, N/V, etc... ~  2013 & 2014:  He remained stable & no surg planned...  RENAL INSUFFICIENCY >> Creatinines as below CARCINOMA, PROSTATE, HX OF (ICD-V10.46) - diagnosed in 1993 & treated w/ XRT... PSA initially ~29 and dropped to ~1 after XRT & it has remained there ever since... followed by DrOttelin- also has BPH, min obstructive symptoms, bilat hydroceles & some benign renal cysts... ~  labs 3/07 by Urology showed PSA= 1.19 ~   labs here 1/10 showed PSA= 1.37 ~  5/11: he reports f/u DrOttelin w/ incontinence symptoms Rx'd Sanctura XR 60mg /d & improved, so he stopped this on his own & states that he is doing satis now... ~  He continues to f/u w/ DrOttelin regularly- we do not have recent notes from Urology... ~  9/13:  He notes slower urinary stream & we decided to try [facility] 0.4mg Qhs... ~  Labs 1/14 showed BUN= 23, creat= 1.4 ~  Labs 7/14 showed BUN= 21, Creat= 1.3 ~  Labs 12/14 Hosp showed Cr=1.7 => 1.0 by discharge after hydration... ~  2/15: he saw DrOttelin for Urology f/u> on Tamsulosin0.4 & they restarted Sanctura60 to help w/ urgency & incont  DEGENERATIVE JOINT DISEASE (ICD-715.90) >> Hx of GOUT (ICD-274.9) >> he has hx of DJD and Gout treated w/ TRAMADOL Prn & ALLOPURINOL 300mg /d; also takes MVI & VIT D 1000 u daily... ~  Labs 5/13 showed Uric= 5.8 ~  XRays show arthritis in shoulders L>R...  SENILE DEMENTIA >> he is 80 y/o HEMATOMA, SUBDURAL (ICD-432.1) - he had a burr hole placed for subdural hematoma in the past...  ~  CT Head 12/14 showed NAD, atrophy, bilat burr holes, and right max sinus dis... ~  CT Head 3/15 showed advanced atrophy, chr microvasc ischemic changes, bilat burr holes, NAD...    Past Surgical History  Procedure Laterality Date  . Pacemaker placement    . Cataract extraction, bilateral    . Subtotal colectomy  12/03    Dr. Lucia Gaskins  . Lipoma excision  2001    left leg  . Burr hole for subdural hematoma      "he's had 4 ORs"/notes 10/29/2001 (05/28/2013)  . Insert / replace / remove pacemaker  1994; 11/2004    Archie Endo 10/29/2001; generator change/notes 11/23/2004  (05/28/2013)  . Pericardiocentesis  1994    post pacemaker placement/notes 03/12/2005 (05/28/2013)  . Tonsillectomy and adenoidectomy  age 22    03/12/2005 (05/28/2013)  . Colonoscopy  2009    Diverticulosis and Hemorrhoids     Outpatient Encounter Prescriptions as of 11/04/2013  Medication Sig  . acetaminophen  (TYLENOL) 325 MG tablet Take 2 tablets (650 mg total) by mouth every 6 (six) hours as needed for mild pain (or Fever >/= 101).  Marland Kitchen allopurinol (ZYLOPRIM) 300 MG tablet Take 300 mg by mouth every morning.   Marland Kitchen aspirin 81 MG chewable tablet Chew 81 mg by mouth every evening.   . brimonidine (ALPHAGAN P) 0.1 % SOLN Place 1 drop into both eyes 2 (two) times daily.   . Cholecalciferol (VITAMIN D) 1000 UNITS capsule Take 1,000 Units by mouth every morning.   . diphenoxylate-atropine (LOMOTIL) 2.5-0.025 MG per tablet Take 2 tablets by mouth 4 (four) times daily. For 7 days, then 2 tab po qid prn diarrhea. Hold for constipation  . feeding supplement, ENSURE COMPLETE, (ENSURE COMPLETE) LIQD Take 237 mLs by mouth 3 (three) times daily between meals.  . furosemide (LASIX) 20  MG tablet Take 1 tablet (20 mg total) by mouth every other day.  . mesalamine (LIALDA) 1.2 G EC tablet Take 2.4 g by mouth 2 (two) times daily.  . Multiple Vitamins-Minerals (CENTRUM SILVER PO) Take 1 tablet by mouth every morning.   . pantoprazole (PROTONIX) 40 MG tablet Take 1 tablet (40 mg total) by mouth daily at 12 noon.  . tamsulosin (FLOMAX) 0.4 MG CAPS capsule Take 0.4 mg by mouth daily after supper.  . Trospium Chloride (SANCTURA XR) 60 MG CP24 Take 60 mg by mouth every evening.   . vitamin C (ASCORBIC ACID) 500 MG tablet Take 1 tablet (500 mg total) by mouth daily.    No Known Allergies   Current Medications, Allergies, Past Medical History, Past Surgical History, Family History, and Social History were reviewed in [employer] record.    Review of Systems        See HPI - all other systems neg except as noted... The patient complains of dyspnea on exertion, peripheral edema, muscle weakness, and difficulty walking.  The patient denies anorexia, fever, weight loss, weight gain, hoarseness, chest pain, syncope, prolonged cough, headaches, hemoptysis, abdominal pain, melena, hematochezia, severe  indigestion/heartburn, hematuria, incontinence, suspicious skin lesions, transient blindness, depression, unusual weight change, abnormal bleeding, enlarged lymph nodes, and angioedema.     Objective:   Physical Exam     WD, overweight, 80 y/o BM in NAD...  GENERAL:  Alert, pleasant & cooperative;  VS- reviewed... HEENT:  [place]/AT, EOM- sl strabismus, PERRLA, EACs-clear (HOH), TMs-wnl, NOSE-clear discharge , THROAT-clear & wnl. NECK:  Supple w/ decrROM; no JVD; normal carotid impulses w/o bruits; no thyromegaly or nodules palpated; no lymphadenopathy. CHEST:  Clear to P & A; without wheezes/ rales/ or rhonchi heard;  Pacer palp in right chest... HEART:  Regular Rhythm; without murmurs/ rubs/ or gallops detected... ABDOMEN:  Scar of prev surg, soft & nontender; normal bowel sounds; no organomegaly or masses palpated...  EXT:  Mod-severe arthritic changes; no varicose veins/ +venous insuffic/ 2+ edema. NEURO:  CN's intact; motor testing normal; sensory testing inconsistant; gait is abnormal & balance only fair (uses walker)... DERM:  No lesions noted; no rash etc...  RADIOLOGY DATA:  Reviewed in the EPIC EMR & discussed w/ the patient...  LABORATORY DATA:  Reviewed in the EPIC EMR & discussed w/ the patient...   Assessment & Plan:    HBP>  BP stable on reduced meds, continue same meds & rf[facility]...  CAD, PACER>  He denies angina, palpit, syncope, etc;  Continue cardiology f/u w/ DrKlein.  Divertics, Crohn's>  He has hx GI hemorrhage in past w/ part colectomy; Hx Crohn's colitis w/ diarrhea- improved on Lialda...  Gallstones>  He remains asymptomatic...  Prostate Ca>  Followed by DrOttelin & stable on Flomax & Sanctura...  DJD>  This is his main issue w/ mobility, self care, remaining independent...  Hx Subdural Hematoma & Senile dementia>  Stable w/o acute problems, he declines neuro eval etc...Marland KitchenMarland KitchenMarland Kitchen   Patient's Medications  New Prescriptions   No  medications on file  Previous Medications   ACETAMINOPHEN (TYLENOL) 325 MG TABLET    Take 2 tablets (650 mg total) by mouth every 6 (six) hours as needed for mild pain (or Fever >/= 101).   ALLOPURINOL (ZYLOPRIM) 300 MG TABLET    Take 300 mg by mouth every morning.    ASPIRIN 81 MG CHEWABLE TABLET    Chew 81 mg by mouth every  evening.    BRIMONIDINE (ALPHAGAN P) 0.1 % SOLN    Place 1 drop into both eyes 2 (two) times daily.    CHOLECALCIFEROL (VITAMIN D) 1000 UNITS CAPSULE    Take 1,000 Units by mouth every morning.    DIPHENOXYLATE-ATROPINE (LOMOTIL) 2.5-0.025 MG PER TABLET    Take 2 tablets by mouth 4 (four) times daily. For 7 days, then 2 tab po qid prn diarrhea. Hold for constipation   FEEDING SUPPLEMENT, ENSURE COMPLETE, (ENSURE COMPLETE) LIQD    Take 237 mLs by mouth 3 (three) times daily between meals.   FUROSEMIDE (LASIX) 20 MG TABLET    Take 1 tablet (20 mg total) by mouth every other day.   MESALAMINE (LIALDA) 1.2 G EC TABLET    Take 2.4 g by mouth 2 (two) times daily.   MULTIPLE VITAMINS-MINERALS (CENTRUM SILVER PO)    Take 1 tablet by mouth every morning.    PANTOPRAZOLE (PROTONIX) 40 MG TABLET    Take 1 tablet (40 mg total) by mouth daily at 12 noon.   TAMSULOSIN (FLOMAX) 0.4 MG CAPS CAPSULE    Take 0.4 mg by mouth daily after supper.   TROSPIUM CHLORIDE (SANCTURA XR) 60 MG CP24    Take 60 mg by mouth every evening.    VITAMIN C (ASCORBIC ACID) 500 MG TABLET    Take 1 tablet (500 mg total) by mouth daily.  Modified Medications   No medications on file  Discontinued Medications   No medications on file

## 2013-11-08 ENCOUNTER — Telehealth: Payer: Self-pay | Admitting: Pulmonary Disease

## 2013-11-08 NOTE — Telephone Encounter (Signed)
Notes Recorded by Noralee Space, MD on 11/05/2013 at 8:33 AM Please notify patient/ son>  Chems are OK w/ norm BS & renal but low albumin- this is nutritional & he needs to eat better + Supplement w/ Ensure/ Boost/ or similar 2 cans per day... CBC w/ anemia- related to his nutrition> Rec nutritional supplements + MVI= 1-a-day w/ Iron daily... Uric is normal on Allopurinol 300mg /d- continue same... --  Son aware of results. Nothing further needed

## 2013-11-16 DIAGNOSIS — M25529 Pain in unspecified elbow: Secondary | ICD-10-CM | POA: Diagnosis not present

## 2013-11-16 DIAGNOSIS — M6281 Muscle weakness (generalized): Secondary | ICD-10-CM | POA: Diagnosis not present

## 2013-11-16 DIAGNOSIS — M25539 Pain in unspecified wrist: Secondary | ICD-10-CM | POA: Diagnosis not present

## 2013-11-16 DIAGNOSIS — R197 Diarrhea, unspecified: Secondary | ICD-10-CM | POA: Diagnosis not present

## 2013-11-16 DIAGNOSIS — R609 Edema, unspecified: Secondary | ICD-10-CM | POA: Diagnosis not present

## 2013-11-16 DIAGNOSIS — N179 Acute kidney failure, unspecified: Secondary | ICD-10-CM | POA: Diagnosis not present

## 2013-11-16 DIAGNOSIS — I4891 Unspecified atrial fibrillation: Secondary | ICD-10-CM | POA: Diagnosis not present

## 2013-11-16 DIAGNOSIS — M79609 Pain in unspecified limb: Secondary | ICD-10-CM | POA: Diagnosis not present

## 2013-11-16 DIAGNOSIS — M25519 Pain in unspecified shoulder: Secondary | ICD-10-CM | POA: Diagnosis not present

## 2013-11-17 DIAGNOSIS — I10 Essential (primary) hypertension: Secondary | ICD-10-CM | POA: Diagnosis not present

## 2013-12-23 ENCOUNTER — Ambulatory Visit: Payer: Medicare Other | Admitting: Pulmonary Disease

## 2013-12-23 DIAGNOSIS — M109 Gout, unspecified: Secondary | ICD-10-CM | POA: Diagnosis not present

## 2013-12-23 DIAGNOSIS — M159 Polyosteoarthritis, unspecified: Secondary | ICD-10-CM | POA: Diagnosis not present

## 2013-12-29 ENCOUNTER — Encounter: Payer: Self-pay | Admitting: Internal Medicine

## 2014-01-07 DIAGNOSIS — Z950 Presence of cardiac pacemaker: Secondary | ICD-10-CM | POA: Diagnosis not present

## 2014-01-07 DIAGNOSIS — R627 Adult failure to thrive: Secondary | ICD-10-CM | POA: Diagnosis not present

## 2014-01-07 DIAGNOSIS — I4891 Unspecified atrial fibrillation: Secondary | ICD-10-CM | POA: Diagnosis not present

## 2014-01-07 DIAGNOSIS — E875 Hyperkalemia: Secondary | ICD-10-CM | POA: Diagnosis not present

## 2014-01-07 DIAGNOSIS — K5090 Crohn's disease, unspecified, without complications: Secondary | ICD-10-CM | POA: Diagnosis not present

## 2014-01-07 DIAGNOSIS — N179 Acute kidney failure, unspecified: Secondary | ICD-10-CM | POA: Diagnosis not present

## 2014-01-07 DIAGNOSIS — R197 Diarrhea, unspecified: Secondary | ICD-10-CM | POA: Diagnosis not present

## 2014-01-27 DIAGNOSIS — R300 Dysuria: Secondary | ICD-10-CM | POA: Diagnosis not present

## 2014-01-27 DIAGNOSIS — Z8546 Personal history of malignant neoplasm of prostate: Secondary | ICD-10-CM | POA: Diagnosis not present

## 2014-01-31 DIAGNOSIS — M25529 Pain in unspecified elbow: Secondary | ICD-10-CM | POA: Diagnosis not present

## 2014-01-31 DIAGNOSIS — M25519 Pain in unspecified shoulder: Secondary | ICD-10-CM | POA: Diagnosis not present

## 2014-03-09 DIAGNOSIS — I10 Essential (primary) hypertension: Secondary | ICD-10-CM | POA: Diagnosis not present

## 2014-03-09 DIAGNOSIS — D649 Anemia, unspecified: Secondary | ICD-10-CM | POA: Diagnosis not present

## 2014-03-09 DIAGNOSIS — E785 Hyperlipidemia, unspecified: Secondary | ICD-10-CM | POA: Diagnosis not present

## 2014-03-10 ENCOUNTER — Encounter: Payer: Self-pay | Admitting: Gastroenterology

## 2014-03-11 ENCOUNTER — Encounter: Payer: Self-pay | Admitting: *Deleted

## 2014-03-18 ENCOUNTER — Other Ambulatory Visit: Payer: Self-pay

## 2014-04-07 DIAGNOSIS — E875 Hyperkalemia: Secondary | ICD-10-CM | POA: Diagnosis not present

## 2014-04-07 DIAGNOSIS — K50918 Crohn's disease, unspecified, with other complication: Secondary | ICD-10-CM | POA: Diagnosis not present

## 2014-04-07 DIAGNOSIS — R627 Adult failure to thrive: Secondary | ICD-10-CM | POA: Diagnosis not present

## 2014-04-07 DIAGNOSIS — K529 Noninfective gastroenteritis and colitis, unspecified: Secondary | ICD-10-CM | POA: Diagnosis not present

## 2014-04-07 DIAGNOSIS — I480 Paroxysmal atrial fibrillation: Secondary | ICD-10-CM | POA: Diagnosis not present

## 2014-04-08 DIAGNOSIS — K5660 Unspecified intestinal obstruction: Secondary | ICD-10-CM | POA: Diagnosis not present

## 2014-04-08 DIAGNOSIS — R1110 Vomiting, unspecified: Secondary | ICD-10-CM | POA: Diagnosis not present

## 2014-04-09 ENCOUNTER — Encounter (HOSPITAL_COMMUNITY): Payer: Self-pay | Admitting: Emergency Medicine

## 2014-04-09 ENCOUNTER — Emergency Department (HOSPITAL_COMMUNITY): Payer: Medicare Other

## 2014-04-09 ENCOUNTER — Inpatient Hospital Stay (HOSPITAL_COMMUNITY)
Admission: EM | Admit: 2014-04-09 | Discharge: 2014-04-14 | DRG: 388 | Disposition: A | Discharge status: Skilled Nursing Facility | Payer: Medicare Other | Attending: Internal Medicine | Admitting: Internal Medicine

## 2014-04-09 DIAGNOSIS — E860 Dehydration: Secondary | ICD-10-CM | POA: Diagnosis present

## 2014-04-09 DIAGNOSIS — R112 Nausea with vomiting, unspecified: Secondary | ICD-10-CM | POA: Diagnosis not present

## 2014-04-09 DIAGNOSIS — N189 Chronic kidney disease, unspecified: Secondary | ICD-10-CM | POA: Diagnosis not present

## 2014-04-09 DIAGNOSIS — M1990 Unspecified osteoarthritis, unspecified site: Secondary | ICD-10-CM | POA: Diagnosis present

## 2014-04-09 DIAGNOSIS — I5032 Chronic diastolic (congestive) heart failure: Secondary | ICD-10-CM | POA: Diagnosis present

## 2014-04-09 DIAGNOSIS — A047 Enterocolitis due to Clostridium difficile: Secondary | ICD-10-CM | POA: Diagnosis present

## 2014-04-09 DIAGNOSIS — I2510 Atherosclerotic heart disease of native coronary artery without angina pectoris: Secondary | ICD-10-CM | POA: Diagnosis present

## 2014-04-09 DIAGNOSIS — N400 Enlarged prostate without lower urinary tract symptoms: Secondary | ICD-10-CM | POA: Diagnosis present

## 2014-04-09 DIAGNOSIS — K5090 Crohn's disease, unspecified, without complications: Secondary | ICD-10-CM | POA: Diagnosis not present

## 2014-04-09 DIAGNOSIS — R109 Unspecified abdominal pain: Secondary | ICD-10-CM

## 2014-04-09 DIAGNOSIS — R140 Abdominal distension (gaseous): Secondary | ICD-10-CM | POA: Diagnosis not present

## 2014-04-09 DIAGNOSIS — F0390 Unspecified dementia without behavioral disturbance: Secondary | ICD-10-CM | POA: Diagnosis present

## 2014-04-09 DIAGNOSIS — K5660 Unspecified intestinal obstruction: Secondary | ICD-10-CM | POA: Diagnosis not present

## 2014-04-09 DIAGNOSIS — R1084 Generalized abdominal pain: Secondary | ICD-10-CM | POA: Diagnosis not present

## 2014-04-09 DIAGNOSIS — E870 Hyperosmolality and hypernatremia: Secondary | ICD-10-CM | POA: Diagnosis not present

## 2014-04-09 DIAGNOSIS — M109 Gout, unspecified: Secondary | ICD-10-CM | POA: Diagnosis present

## 2014-04-09 DIAGNOSIS — R600 Localized edema: Secondary | ICD-10-CM | POA: Diagnosis not present

## 2014-04-09 DIAGNOSIS — Z9049 Acquired absence of other specified parts of digestive tract: Secondary | ICD-10-CM | POA: Diagnosis present

## 2014-04-09 DIAGNOSIS — M6281 Muscle weakness (generalized): Secondary | ICD-10-CM | POA: Diagnosis not present

## 2014-04-09 DIAGNOSIS — Z950 Presence of cardiac pacemaker: Secondary | ICD-10-CM | POA: Diagnosis not present

## 2014-04-09 DIAGNOSIS — K219 Gastro-esophageal reflux disease without esophagitis: Secondary | ICD-10-CM | POA: Diagnosis present

## 2014-04-09 DIAGNOSIS — I4891 Unspecified atrial fibrillation: Secondary | ICD-10-CM | POA: Diagnosis present

## 2014-04-09 DIAGNOSIS — K565 Intestinal adhesions [bands] with obstruction (postprocedural) (postinfection): Secondary | ICD-10-CM | POA: Diagnosis present

## 2014-04-09 DIAGNOSIS — Z923 Personal history of irradiation: Secondary | ICD-10-CM | POA: Diagnosis not present

## 2014-04-09 DIAGNOSIS — I482 Chronic atrial fibrillation: Secondary | ICD-10-CM | POA: Diagnosis present

## 2014-04-09 DIAGNOSIS — K56609 Unspecified intestinal obstruction, unspecified as to partial versus complete obstruction: Secondary | ICD-10-CM

## 2014-04-09 DIAGNOSIS — K5010 Crohn's disease of large intestine without complications: Secondary | ICD-10-CM | POA: Diagnosis present

## 2014-04-09 DIAGNOSIS — K5790 Diverticulosis of intestine, part unspecified, without perforation or abscess without bleeding: Secondary | ICD-10-CM | POA: Diagnosis present

## 2014-04-09 DIAGNOSIS — I442 Atrioventricular block, complete: Secondary | ICD-10-CM | POA: Diagnosis present

## 2014-04-09 DIAGNOSIS — H409 Unspecified glaucoma: Secondary | ICD-10-CM | POA: Diagnosis present

## 2014-04-09 DIAGNOSIS — I10 Essential (primary) hypertension: Secondary | ICD-10-CM | POA: Diagnosis present

## 2014-04-09 DIAGNOSIS — Z7982 Long term (current) use of aspirin: Secondary | ICD-10-CM | POA: Diagnosis not present

## 2014-04-09 DIAGNOSIS — M150 Primary generalized (osteo)arthritis: Secondary | ICD-10-CM | POA: Diagnosis not present

## 2014-04-09 DIAGNOSIS — Z8546 Personal history of malignant neoplasm of prostate: Secondary | ICD-10-CM

## 2014-04-09 DIAGNOSIS — E43 Unspecified severe protein-calorie malnutrition: Secondary | ICD-10-CM | POA: Diagnosis present

## 2014-04-09 DIAGNOSIS — K5731 Diverticulosis of large intestine without perforation or abscess with bleeding: Secondary | ICD-10-CM | POA: Diagnosis present

## 2014-04-09 DIAGNOSIS — E876 Hypokalemia: Secondary | ICD-10-CM

## 2014-04-09 DIAGNOSIS — R262 Difficulty in walking, not elsewhere classified: Secondary | ICD-10-CM | POA: Diagnosis not present

## 2014-04-09 DIAGNOSIS — K6389 Other specified diseases of intestine: Secondary | ICD-10-CM | POA: Diagnosis not present

## 2014-04-09 DIAGNOSIS — N179 Acute kidney failure, unspecified: Secondary | ICD-10-CM | POA: Diagnosis present

## 2014-04-09 DIAGNOSIS — N170 Acute kidney failure with tubular necrosis: Secondary | ICD-10-CM | POA: Diagnosis not present

## 2014-04-09 DIAGNOSIS — D649 Anemia, unspecified: Secondary | ICD-10-CM | POA: Diagnosis present

## 2014-04-09 DIAGNOSIS — K5669 Other intestinal obstruction: Secondary | ICD-10-CM | POA: Diagnosis not present

## 2014-04-09 DIAGNOSIS — E46 Unspecified protein-calorie malnutrition: Secondary | ICD-10-CM | POA: Diagnosis not present

## 2014-04-09 DIAGNOSIS — K2970 Gastritis, unspecified, without bleeding: Secondary | ICD-10-CM | POA: Diagnosis not present

## 2014-04-09 LAB — COMPREHENSIVE METABOLIC PANEL
ALBUMIN: 3.1 g/dL — AB (ref 3.5–5.2)
ALT: 21 U/L (ref 0–53)
AST: 24 U/L (ref 0–37)
Alkaline Phosphatase: 78 U/L (ref 39–117)
Anion gap: 20 — ABNORMAL HIGH (ref 5–15)
BUN: 61 mg/dL — AB (ref 6–23)
CALCIUM: 10 mg/dL (ref 8.4–10.5)
CO2: 29 mEq/L (ref 19–32)
Chloride: 92 mEq/L — ABNORMAL LOW (ref 96–112)
Creatinine, Ser: 2.54 mg/dL — ABNORMAL HIGH (ref 0.50–1.35)
GFR calc Af Amer: 23 mL/min — ABNORMAL LOW (ref >90)
GFR calc non Af Amer: 20 mL/min — ABNORMAL LOW (ref >90)
Glucose, Bld: 148 mg/dL — ABNORMAL HIGH (ref 70–99)
Potassium: 4.9 mEq/L (ref 3.7–5.3)
Sodium: 141 mEq/L (ref 137–147)
TOTAL PROTEIN: 8.5 g/dL — AB (ref 6.0–8.3)
Total Bilirubin: 0.6 mg/dL (ref 0.3–1.2)

## 2014-04-09 LAB — CBC WITH DIFFERENTIAL/PLATELET
BASOS ABS: 0 10*3/uL (ref 0.0–0.1)
Basophils Relative: 0 % (ref 0–1)
Eosinophils Absolute: 0 10*3/uL (ref 0.0–0.7)
Eosinophils Relative: 0 % (ref 0–5)
HEMATOCRIT: 36.5 % — AB (ref 39.0–52.0)
Hemoglobin: 12.6 g/dL — ABNORMAL LOW (ref 13.0–17.0)
LYMPHS PCT: 8 % — AB (ref 12–46)
Lymphs Abs: 0.8 10*3/uL (ref 0.7–4.0)
MCH: 31.2 pg (ref 26.0–34.0)
MCHC: 34.5 g/dL (ref 30.0–36.0)
MCV: 90.3 fL (ref 78.0–100.0)
MONOS PCT: 9 % (ref 3–12)
Monocytes Absolute: 0.9 10*3/uL (ref 0.1–1.0)
NEUTROS ABS: 8.5 10*3/uL — AB (ref 1.7–7.7)
Neutrophils Relative %: 83 % — ABNORMAL HIGH (ref 43–77)
Platelets: 182 10*3/uL (ref 150–400)
RBC: 4.04 MIL/uL — ABNORMAL LOW (ref 4.22–5.81)
RDW: 16.5 % — AB (ref 11.5–15.5)
WBC: 10.2 10*3/uL (ref 4.0–10.5)

## 2014-04-09 LAB — URINALYSIS, ROUTINE W REFLEX MICROSCOPIC
Glucose, UA: NEGATIVE mg/dL
Hgb urine dipstick: NEGATIVE
KETONES UR: 15 mg/dL — AB
NITRITE: NEGATIVE
Protein, ur: NEGATIVE mg/dL
Specific Gravity, Urine: 1.025 (ref 1.005–1.030)
UROBILINOGEN UA: 0.2 mg/dL (ref 0.0–1.0)
pH: 5 (ref 5.0–8.0)

## 2014-04-09 LAB — URINE MICROSCOPIC-ADD ON

## 2014-04-09 LAB — MRSA PCR SCREENING: MRSA BY PCR: NEGATIVE

## 2014-04-09 LAB — TROPONIN I

## 2014-04-09 LAB — I-STAT CG4 LACTIC ACID, ED: Lactic Acid, Venous: 3.52 mmol/L — ABNORMAL HIGH (ref 0.5–2.2)

## 2014-04-09 LAB — LIPASE, BLOOD: Lipase: 21 U/L (ref 11–59)

## 2014-04-09 MED ORDER — ONDANSETRON HCL 4 MG/2ML IJ SOLN: 4.0000 mg | Freq: Four times a day (QID) | INTRAMUSCULAR | Status: DC | PRN

## 2014-04-09 MED ORDER — ONDANSETRON HCL 4 MG/2ML IJ SOLN
4.0000 mg | Freq: Once | INTRAMUSCULAR | Status: AC
  Administered 2014-04-09: 4 mg via INTRAVENOUS
  Filled 2014-04-09: qty 2

## 2014-04-09 MED ORDER — SODIUM CHLORIDE 0.9 % IV BOLUS (SEPSIS): 1000.0000 mL | INTRAVENOUS | Status: DC | PRN

## 2014-04-09 MED ORDER — SODIUM CHLORIDE 0.9 % IV BOLUS (SEPSIS)
500.0000 mL | Freq: Once | INTRAVENOUS | Status: AC
  Administered 2014-04-09: 500 mL via INTRAVENOUS

## 2014-04-09 MED ORDER — BRIMONIDINE TARTRATE 0.2 % OP SOLN
1.0000 [drp] | Freq: Two times a day (BID) | OPHTHALMIC | Status: DC
  Administered 2014-04-09 – 2014-04-14 (×11): 1 [drp] via OPHTHALMIC
  Filled 2014-04-09 (×2): qty 5

## 2014-04-09 MED ORDER — IOHEXOL 300 MG/ML  SOLN
25.0000 mL | INTRAMUSCULAR | Status: AC
  Administered 2014-04-09: 25 mL via ORAL

## 2014-04-09 MED ORDER — INFLUENZA VAC SPLIT QUAD 0.5 ML IM SUSY
0.5000 mL | PREFILLED_SYRINGE | INTRAMUSCULAR | Status: AC
  Administered 2014-04-11: 0.5 mL via INTRAMUSCULAR
  Filled 2014-04-09: qty 0.5

## 2014-04-09 MED ORDER — ONDANSETRON HCL 4 MG PO TABS: 4.0000 mg | ORAL_TABLET | Freq: Four times a day (QID) | ORAL | Status: DC | PRN

## 2014-04-09 MED ORDER — HEPARIN SODIUM (PORCINE) 5000 UNIT/ML IJ SOLN
5000.0000 [IU] | Freq: Three times a day (TID) | INTRAMUSCULAR | Status: DC
  Administered 2014-04-09 – 2014-04-14 (×16): 5000 [IU] via SUBCUTANEOUS
  Filled 2014-04-09 (×18): qty 1

## 2014-04-09 MED ORDER — FENTANYL CITRATE 0.05 MG/ML IJ SOLN
50.0000 ug | Freq: Once | INTRAMUSCULAR | Status: AC
  Administered 2014-04-09: 50 ug via INTRAVENOUS
  Filled 2014-04-09: qty 2

## 2014-04-09 MED ORDER — BRIMONIDINE TARTRATE 0.15 % OP SOLN
1.0000 [drp] | Freq: Two times a day (BID) | OPHTHALMIC | Status: DC
  Filled 2014-04-09: qty 5

## 2014-04-09 MED ORDER — MORPHINE SULFATE 2 MG/ML IJ SOLN: 2.0000 mg | INTRAMUSCULAR | Status: DC | PRN

## 2014-04-09 MED ORDER — LIDOCAINE 5 % EX PTCH
1.0000 | MEDICATED_PATCH | CUTANEOUS | Status: DC
  Administered 2014-04-09 – 2014-04-14 (×6): 1 via TRANSDERMAL
  Filled 2014-04-09 (×7): qty 1

## 2014-04-09 MED ORDER — METOPROLOL TARTRATE 1 MG/ML IV SOLN: 5.0000 mg | INTRAVENOUS | Status: DC | PRN

## 2014-04-09 MED ORDER — SODIUM CHLORIDE 0.9 % IV SOLN
INTRAVENOUS | Status: AC
  Administered 2014-04-10: 13:00:00 via INTRAVENOUS

## 2014-04-09 MED ORDER — BISACODYL 10 MG RE SUPP
10.0000 mg | Freq: Every day | RECTAL | Status: DC
Start: 2014-04-09 — End: 2014-04-11
  Administered 2014-04-09 – 2014-04-10 (×2): 10 mg via RECTAL
  Filled 2014-04-09 (×2): qty 1

## 2014-04-09 MED ORDER — PNEUMOCOCCAL VAC POLYVALENT 25 MCG/0.5ML IJ INJ
0.5000 mL | INJECTION | INTRAMUSCULAR | Status: AC
  Administered 2014-04-11: 0.5 mL via INTRAMUSCULAR
  Filled 2014-04-09: qty 0.5

## 2014-04-09 MED ORDER — PANTOPRAZOLE SODIUM 40 MG IV SOLR
40.0000 mg | Freq: Two times a day (BID) | INTRAVENOUS | Status: DC
  Administered 2014-04-09 – 2014-04-10 (×3): 40 mg via INTRAVENOUS
  Filled 2014-04-09 (×5): qty 40

## 2014-04-09 MED ORDER — HYDRALAZINE HCL 20 MG/ML IJ SOLN: 10.0000 mg | Freq: Four times a day (QID) | INTRAMUSCULAR | Status: DC | PRN

## 2014-04-09 MED ORDER — KCL IN DEXTROSE-NACL 20-5-0.9 MEQ/L-%-% IV SOLN
INTRAVENOUS | Status: DC
  Administered 2014-04-09: 11:00:00 via INTRAVENOUS
  Filled 2014-04-09 (×2): qty 1000

## 2014-04-09 MED ORDER — GUAIFENESIN-DM 100-10 MG/5ML PO SYRP: 5.0000 mL | ORAL_SOLUTION | ORAL | Status: DC | PRN

## 2014-04-09 NOTE — ED Notes (Signed)
Attempted to call report. Call back number is [phone]

## 2014-04-09 NOTE — Progress Notes (Addendum)
BP is now within parameter as ordered.  500 cc given over 2 hours.  Lung sounds unchanged.  No JVD noted.  MD was notified and additional 500 cc bolus was canceled.  Sacral dressing applied per protocol.  Skin is not broken, pink in color from old healed skin issues.  Oral care given.

## 2014-04-09 NOTE — ED Notes (Signed)
Dr. Grandville Silos and Admiting Dr. At bedside.

## 2014-04-09 NOTE — ED Notes (Signed)
Dr Christy Gentles given a copy of lactic acid 3.52

## 2014-04-09 NOTE — Plan of Care (Signed)
Problem: Consults Goal: Skin Care Protocol Initiated - if Braden Score 18 or less If consults are not indicated, leave blank or document N/A Outcome: Progressing Per protocol sacral dressing

## 2014-04-09 NOTE — ED Provider Notes (Signed)
CSN: [id number]     Arrival date & time 04/09/14  0043 History   First MD Initiated Contact with Patient 04/09/14 0129     Chief Complaint  Patient presents with  . Abdominal Pain  . Emesis      Patient is a 80 y.o. male presenting with abdominal pain and vomiting. The history is provided by the patient.  Abdominal Pain Pain location:  Generalized Pain quality: aching   Pain severity:  Moderate Onset quality:  Gradual Duration: "Several weeks" Timing:  Intermittent Progression:  Worsening Relieved by:  Nothing Worsened by:  Movement and palpation Associated symptoms: fatigue, nausea and vomiting   Associated symptoms: no chest pain, no dysuria and no shortness of breath   Emesis Associated symptoms: abdominal pain   Associated symptoms: no headaches   Patients reports he has had abdominal pain for "several weeks" and now he is having associated nausea/vomiting.  Per nursing report he is vomiting darkened material  He denies cp/sob No HA  He reports constipation    Past Medical History  Diagnosis Date  . Glaucoma   . Acute bronchitis   . Hypertension   . CAD (coronary artery disease)     nuclear stress test 2006 inferoseptal and apical infarct ejection fraction 48%  . Atrioventricular block, complete   . Diverticulosis of colon with hemorrhage   . Hemorrhage of gastrointestinal tract, unspecified   . Unspecified intestinal obstruction   . Inguinal hernia   . Gallstone   . DJD (degenerative joint disease)   . Gout   . Hematoma     subdural  . Internal hemorrhoids without mention of complication   . Pacemaker   . Prostate cancer     radiation therapy in 1996/notes 10/29/2001 (05/28/2013)  . Colitis    Past Surgical History  Procedure Laterality Date  . Pacemaker placement    . Cataract extraction, bilateral    . Subtotal colectomy  12/03    Dr. Lucia Gaskins  . Lipoma excision  2001    left leg  . Burr hole for subdural hematoma      "he's had 4 ORs"/notes  10/29/2001 (05/28/2013)  . Insert / replace / remove pacemaker  1994; 11/2004    Archie Endo 10/29/2001; generator change/notes 11/23/2004  (05/28/2013)  . Pericardiocentesis  1994    post pacemaker placement/notes 03/12/2005 (05/28/2013)  . Tonsillectomy and adenoidectomy  age 59    03/12/2005 (05/28/2013)  . Colonoscopy  2009    Diverticulosis and Hemorrhoids    No family history on file. History  Substance Use Topics  . Smoking status: Never Smoker   . Smokeless tobacco: Never Used  . Alcohol Use: No     Comment: Occ wine     Review of Systems  Constitutional: Positive for fatigue.  Respiratory: Negative for shortness of breath.   Cardiovascular: Negative for chest pain.  Gastrointestinal: Positive for nausea, vomiting and abdominal pain.  Genitourinary: Negative for dysuria.  Neurological: Negative for headaches.  All other systems reviewed and are negative.     Allergies  Review of patient's allergies indicates no known allergies.  Home Medications   Prior to Admission medications   Medication Sig Start Date End Date Taking? Authorizing Provider  acetaminophen (TYLENOL) 325 MG tablet Take 2 tablets (650 mg total) by mouth every 6 (six) hours as needed for mild pain (or Fever >/= 101). 08/14/13  Yes Delfina Redwood, MD  allopurinol (ZYLOPRIM) 300 MG tablet Take 300 mg by mouth every morning.  Yes Historical Provider, MD  aspirin 81 MG chewable tablet Chew 81 mg by mouth every evening.    Yes Historical Provider, MD  brimonidine (ALPHAGAN P) 0.1 % SOLN Place 1 drop into both eyes 2 (two) times daily.    Yes Historical Provider, MD  Cholecalciferol (VITAMIN D) 1000 UNITS capsule Take 1,000 Units by mouth every morning.    Yes Historical Provider, MD  diphenoxylate-atropine (LOMOTIL) 2.5-0.025 MG per tablet Take 2 tablets by mouth 4 (four) times daily. For 7 days, then 2 tab po qid prn diarrhea. Hold for constipation 08/14/13  Yes Delfina Redwood, MD  furosemide (LASIX) 20  MG tablet Take 1 tablet (20 mg total) by mouth every other day. 05/29/13  Yes Barton Dubois, MD  lidocaine (LIDODERM) 5 % Place 1 patch onto the skin daily. Remove & Discard patch within 12 hours or as directed by MD   Yes Historical Provider, MD  mesalamine (LIALDA) 1.2 G EC tablet Take 2.4 g by mouth 2 (two) times daily. 08/04/13  Yes Amy S Esterwood, PA-C  Multiple Vitamin (MULTIVITAMIN WITH MINERALS) TABS tablet Take 1 tablet by mouth daily.   Yes Historical Provider, MD  ondansetron (ZOFRAN) 4 MG tablet Take 4 mg by mouth every 6 (six) hours as needed for nausea or vomiting.   Yes Historical Provider, MD  tamsulosin (FLOMAX) 0.4 MG CAPS capsule Take 0.4 mg by mouth daily after supper.   Yes Historical Provider, MD  Trospium Chloride (SANCTURA XR) 60 MG CP24 Take 60 mg by mouth every evening.    Yes Historical Provider, MD  vitamin C (ASCORBIC ACID) 500 MG tablet Take 1 tablet (500 mg total) by mouth daily. 08/14/13  Yes Delfina Redwood, MD  feeding supplement, ENSURE COMPLETE, (ENSURE COMPLETE) LIQD Take 237 mLs by mouth 3 (three) times daily between meals. 09/13/13   Nishant Dhungel, MD  Multiple Vitamins-Minerals (CENTRUM SILVER PO) Take 1 tablet by mouth every morning.     Historical Provider, MD  pantoprazole (PROTONIX) 40 MG tablet Take 1 tablet (40 mg total) by mouth daily at 12 noon. 09/13/13   Nishant Dhungel, MD   BP 103/74 mmHg  Pulse 88  Temp(Src) 98.6 F (37 C) (Oral)  Resp 24  SpO2 92% Physical Exam CONSTITUTIONAL: elderly, no acute distress HEAD: Normocephalic/atraumatic EYES: EOMI ENMT: Mucous membranes dry NECK: supple no meningeal signs SPINE:entire spine nontender CV: S1/S2 noted  LUNGS: Lungs are clear to auscultation bilaterally, no apparent distress ABDOMEN: soft, distended, diffuse moderate tenderness.  No rebound or guarding noted GU:no cva tenderness. No inguinal hernia.  No scrotal tenderness/edema NEURO: Pt is awake/alert, moves all  extremitiesx4 EXTREMITIES: pulses normal, full ROM SKIN: warm, color normal PSYCH: no abnormalities of mood noted  ED Course  Procedures    2:20 AM D/w radiology concerning Xray findings  recommends upright xray to evaluate for pneumoperitoneum 2:40 AM No definite pneumoperitoneum on repeat XRAY Will obtain CT imaging Pt currently stable/awake/alert 6:33 AM SBO noted on CT imaging - no pneumoperitoneum He was given NG tube with large amt of fluid suctioned Pt feels improved His abdomen is soft D/w dr Jana Hakim, will admit.   She requests I consult surgery BP 113/65 mmHg  Pulse 107  Temp(Src) 97.8 F (36.6 C) (Oral)  Resp 17  SpO2 93% 6:35 AM D/w dr Marden Noble blackman, surgery, he is aware of patient, and he will be admitted to medicine  Labs Review Labs Reviewed  CBC WITH DIFFERENTIAL - Abnormal; Notable for the following:  RBC 4.04 (*)    Hemoglobin 12.6 (*)    HCT 36.5 (*)    RDW 16.5 (*)    Neutrophils Relative % 83 (*)    Neutro Abs 8.5 (*)    Lymphocytes Relative 8 (*)    All other components within normal limits  I-STAT CG4 LACTIC ACID, ED - Abnormal; Notable for the following:    Lactic Acid, Venous 3.52 (*)    All other components within normal limits  COMPREHENSIVE METABOLIC PANEL  LIPASE, BLOOD  TROPONIN I    Imaging Review Ct Abdomen Pelvis Wo Contrast  04/09/2014   CLINICAL DATA:  Abdominal pain, nausea, and vomiting.  EXAM: CT ABDOMEN AND PELVIS WITHOUT CONTRAST  TECHNIQUE: Multidetector CT imaging of the abdomen and pelvis was performed following the standard protocol without IV contrast.  COMPARISON:  09/25/2009  FINDINGS: Atelectasis or consolidation in both lung bases with bronchiectasis. Cardiac enlargement.  Go distention of fluid filled stomach and small bowel. Distal decompression of small bowel. Stool-filled colon. Changes consistent with high-grade small bowel obstruction. Cause is not identified. No free fluid or free air in the  abdomen.  The unenhanced appearance of the liver, abdominal aorta, inferior vena cava, and retroperitoneal lymph nodes is unremarkable. Surgical absence of the gallbladder. Small gas collections in the gallbladder fossa probably represent pneumobilia. This is likely postoperative although fistula is not excluded. Diffuse pancreatic ductal dilatation with pancreatic calcifications suggesting chronic pancreatitis. Peripherally calcified mass in the region of the head of the pancreas measures 1.9 cm diameter. This could represent an aneurysm or pancreatic mass. Diffuse pancreatic ductal dilatation is present. Low-attenuation lesion in the superior aspect of the spleen measuring 3.9 cm diameter, probably representing a cyst. Probable cysts in both kidneys without hydronephrosis.  Pelvis: Bladder Leger is not thickened. No free or loculated pelvic fluid collections. Small bilateral inguinal hernias containing fat. Right inguinal hernia also contains a portion of the bladder. No pelvic mass or lymphadenopathy. Appendix is not demonstrated. Markedly diffuse degenerative changes from throughout the lumbar spine. Diffuse bone demineralization. Sclerotic changes in the vertebral bodies likely is degenerative. No destructive bone lesions.  IMPRESSION: High-grade small bowel obstruction with dilatation of fluid-filled small bowel and stomach. Distal decompression. Stool-filled colon. No free air. Multiple additional findings demonstrated and discussed in the body of the report.   Electronically Signed   By: Lucienne Capers M.D.   On: 04/09/2014 05:26   Dg Abd Portable 1v  04/09/2014   CLINICAL DATA:  Abdominal pain. Possible free air on previous supine images.  EXAM: PORTABLE ABDOMEN - 1 VIEW  COMPARISON:  Supine abdomen 04/09/2014.  FINDINGS: Technically limited study due to patient's motion. Gas distended colon likely representing ileus. Gas in the right upper quadrant consistent with colonic interposition. No definite  evidence of free air.  IMPRESSION: Gas distended colon likely representing ileus. Gas in the right upper quadrant probably representing colonic interposition. No definite evidence of free air.   Electronically Signed   By: Lucienne Capers M.D.   On: 04/09/2014 02:33   Dg Abd Portable 1v  04/09/2014   CLINICAL DATA:  Initial evaluation for abdominal pain with distension, nausea, vomiting.  EXAM: PORTABLE ABDOMEN - 1 VIEW  COMPARISON:  Prior CT from 09/25/2009.  FINDINGS: Multiple dilated loops of gas-filled small bowel are seen scattered throughout the abdomen. These measure up to 5.8 cm in diameter. The gas-filled colon in the right upper quadrant is mildly prominent is well. There is a paucity of gas distally.  Findings are suggestive of possible ileus, although a distal obstructive process could also have this appearance. Gas lucency at the right hemidiaphragm is favored to reflect colonic interposition with superimposed right basilar atelectasis. No definite free air identified, although this could be confirmed within upright projection.  No soft tissue mass identified. Scattered surgical clips present within the abdomen.  Extensive multilevel degenerative disease present within the lumbar spine with irregular osteophytic spurring. Irregular calcifications adjacent to the L2 vertebral body may be vascular in nature.  IMPRESSION: 1. Multiple prominent gas-filled loops of small and large bowel within the abdomen. Findings are favored to reflect ileus, although possible distal obstructive process could also be considered in the correct clinical setting. 2. Gas lucency overlying the right hemidiaphragm, favored to reflect colonic interposition with superimposed right basilar atelectasis on these supine films. Further evaluation with upright film to ensure no free intraperitoneal air delete present is recommended. Alternatively, further evaluation with cross-sectional imaging could be performed for further  evaluation. Results were called by telephone at the time of interpretation on 04/09/2014 at 1:55 am to Dr. Ripley Fraise , who verbally acknowledged these results.   Electronically Signed   By: Jeannine Boga M.D.   On: 04/09/2014 01:58     EKG Interpretation   Date/Time:  Saturday April 09 2014 01:02:30 EST Ventricular Rate:  105 PR Interval:  125 QRS Duration: 135 QT Interval:  446 QTC Calculation: 590 R Axis:   87 Text Interpretation:  Ventricular-paced complexes No further analysis  attempted due to paced rhythm Confirmed by Christy Gentles  MD, Elenore Rota ([id number]) on  04/09/2014 1:10:34 AM      MDM   Final diagnoses:  SBO (small bowel obstruction)  Acute renal failure, unspecified acute renal failure type  Dehydration    Nursing notes including past medical history and social history reviewed and considered in documentation Labs/vital reviewed and considered xrays reviewed and considered     Sharyon Cable, MD 04/09/14 [phone]

## 2014-04-09 NOTE — Consult Note (Signed)
Reason for Consult:SBO Referring Physician: Dr. Lala Lund  Edward Harvey is an 80 y.o. male.  HPI: Edward Harvey is a resident of [facility]. He was sent to the ED for N/V abdominal distention and abdominal pain. He has some degree of dementia and initially complains of shoulder and ankle pain. On further questioning, he does report lower abdominal pain. He does not remember vomiting but SNF reports that. He is S/P subtotal colectomy by Dr. Lucia Gaskins in the past.  Past Medical History  Diagnosis Date  . Glaucoma   . Acute bronchitis   . Hypertension   . CAD (coronary artery disease)     nuclear stress test 2006 inferoseptal and apical infarct ejection fraction 48%  . Atrioventricular block, complete   . Diverticulosis of colon with hemorrhage   . Hemorrhage of gastrointestinal tract, unspecified   . Unspecified intestinal obstruction   . Inguinal hernia   . Gallstone   . DJD (degenerative joint disease)   . Gout   . Hematoma     subdural  . Internal hemorrhoids without mention of complication   . Pacemaker   . Prostate cancer     radiation therapy in 1996/notes 10/29/2001 (05/28/2013)  . Colitis     Past Surgical History  Procedure Laterality Date  . Pacemaker placement    . Cataract extraction, bilateral    . Subtotal colectomy  12/03    Dr. Lucia Gaskins  . Lipoma excision  2001    left leg  . Burr hole for subdural hematoma      "he's had 4 ORs"/notes 10/29/2001 (05/28/2013)  . Insert / replace / remove pacemaker  1994; 11/2004    Archie Endo 10/29/2001; generator change/notes 11/23/2004  (05/28/2013)  . Pericardiocentesis  1994    post pacemaker placement/notes 03/12/2005 (05/28/2013)  . Tonsillectomy and adenoidectomy  age 27    03/12/2005 (05/28/2013)  . Colonoscopy  2009    Diverticulosis and Hemorrhoids     No family history on file.  Social History:  reports that he has never smoked. He has never used smokeless tobacco. He reports that he does not drink alcohol or use  illicit drugs.  Allergies: No Known Allergies  Medications: Prior to Admission:  (Not in a hospital admission)  Results for orders placed or performed during the hospital encounter of 04/09/14 (from the past 48 hour(s))  Comprehensive metabolic panel     Status: Abnormal   Collection Time: 04/09/14  1:47 AM  Result Value Ref Range   Sodium 141 137 - 147 mEq/L   Potassium 4.9 3.7 - 5.3 mEq/L   Chloride 92 (L) 96 - 112 mEq/L   CO2 29 19 - 32 mEq/L   Glucose, Bld 148 (H) 70 - 99 mg/dL   BUN 61 (H) 6 - 23 mg/dL   Creatinine, Ser 2.54 (H) 0.50 - 1.35 mg/dL   Calcium 10.0 8.4 - 10.5 mg/dL   Total Protein 8.5 (H) 6.0 - 8.3 g/dL   Albumin 3.1 (L) 3.5 - 5.2 g/dL   AST 24 0 - 37 U/L    Comment: HEMOLYSIS AT THIS LEVEL MAY AFFECT RESULT   ALT 21 0 - 53 U/L   Alkaline Phosphatase 78 39 - 117 U/L   Total Bilirubin 0.6 0.3 - 1.2 mg/dL   GFR calc non Af Amer 20 (L) >90 mL/min   GFR calc Af Amer 23 (L) >90 mL/min    Comment: (NOTE) The eGFR has been calculated using the CKD EPI equation. This calculation has  not been validated in all clinical situations. eGFR's persistently <90 mL/min signify possible Chronic Kidney Disease.    Anion gap 20 (H) 5 - 15  CBC with Differential     Status: Abnormal   Collection Time: 04/09/14  1:47 AM  Result Value Ref Range   WBC 10.2 4.0 - 10.5 K/uL   RBC 4.04 (L) 4.22 - 5.81 MIL/uL   Hemoglobin 12.6 (L) 13.0 - 17.0 g/dL   HCT 36.5 (L) 39.0 - 52.0 %   MCV 90.3 78.0 - 100.0 fL   MCH 31.2 26.0 - 34.0 pg   MCHC 34.5 30.0 - 36.0 g/dL   RDW 16.5 (H) 11.5 - 15.5 %   Platelets 182 150 - 400 K/uL   Neutrophils Relative % 83 (H) 43 - 77 %   Neutro Abs 8.5 (H) 1.7 - 7.7 K/uL   Lymphocytes Relative 8 (L) 12 - 46 %   Lymphs Abs 0.8 0.7 - 4.0 K/uL   Monocytes Relative 9 3 - 12 %   Monocytes Absolute 0.9 0.1 - 1.0 K/uL   Eosinophils Relative 0 0 - 5 %   Eosinophils Absolute 0.0 0.0 - 0.7 K/uL   Basophils Relative 0 0 - 1 %   Basophils Absolute 0.0 0.0 - 0.1  K/uL  Lipase, blood     Status: None   Collection Time: 04/09/14  1:47 AM  Result Value Ref Range   Lipase 21 11 - 59 U/L  Troponin I     Status: None   Collection Time: 04/09/14  1:47 AM  Result Value Ref Range   Troponin I <0.30 <0.30 ng/mL    Comment:        Due to the release kinetics of cTnI, a negative result within the first hours of the onset of symptoms does not rule out myocardial infarction with certainty. If myocardial infarction is still suspected, repeat the test at appropriate intervals.   I-Stat CG4 Lactic Acid, ED     Status: Abnormal   Collection Time: 04/09/14  1:58 AM  Result Value Ref Range   Lactic Acid, Venous 3.52 (H) 0.5 - 2.2 mmol/L  Urinalysis, Routine w reflex microscopic     Status: Abnormal   Collection Time: 04/09/14  5:43 AM  Result Value Ref Range   Color, Urine ORANGE (A) YELLOW    Comment: BIOCHEMICALS MAY BE AFFECTED BY COLOR   APPearance CLEAR CLEAR   Specific Gravity, Urine 1.025 1.005 - 1.030   pH 5.0 5.0 - 8.0   Glucose, UA NEGATIVE NEGATIVE mg/dL   Hgb urine dipstick NEGATIVE NEGATIVE   Bilirubin Urine SMALL (A) NEGATIVE   Ketones, ur 15 (A) NEGATIVE mg/dL   Protein, ur NEGATIVE NEGATIVE mg/dL   Urobilinogen, UA 0.2 0.0 - 1.0 mg/dL   Nitrite NEGATIVE NEGATIVE   Leukocytes, UA TRACE (A) NEGATIVE  Urine microscopic-add on     Status: Abnormal   Collection Time: 04/09/14  5:43 AM  Result Value Ref Range   Squamous Epithelial / LPF RARE RARE   WBC, UA 0-2 <3 WBC/hpf   Bacteria, UA FEW (A) RARE   Casts HYALINE CASTS (A) NEGATIVE    Ct Abdomen Pelvis Wo Contrast  04/09/2014   CLINICAL DATA:  Abdominal pain, nausea, and vomiting.  EXAM: CT ABDOMEN AND PELVIS WITHOUT CONTRAST  TECHNIQUE: Multidetector CT imaging of the abdomen and pelvis was performed following the standard protocol without IV contrast.  COMPARISON:  09/25/2009  FINDINGS: Atelectasis or consolidation in both lung bases with bronchiectasis.  Cardiac enlargement.  Go  distention of fluid filled stomach and small bowel. Distal decompression of small bowel. Stool-filled colon. Changes consistent with high-grade small bowel obstruction. Cause is not identified. No free fluid or free air in the abdomen.  The unenhanced appearance of the liver, abdominal aorta, inferior vena cava, and retroperitoneal lymph nodes is unremarkable. Surgical absence of the gallbladder. Small gas collections in the gallbladder fossa probably represent pneumobilia. This is likely postoperative although fistula is not excluded. Diffuse pancreatic ductal dilatation with pancreatic calcifications suggesting chronic pancreatitis. Peripherally calcified mass in the region of the head of the pancreas measures 1.9 cm diameter. This could represent an aneurysm or pancreatic mass. Diffuse pancreatic ductal dilatation is present. Low-attenuation lesion in the superior aspect of the spleen measuring 3.9 cm diameter, probably representing a cyst. Probable cysts in both kidneys without hydronephrosis.  Pelvis: Bladder Bogan is not thickened. No free or loculated pelvic fluid collections. Small bilateral inguinal hernias containing fat. Right inguinal hernia also contains a portion of the bladder. No pelvic mass or lymphadenopathy. Appendix is not demonstrated. Markedly diffuse degenerative changes from throughout the lumbar spine. Diffuse bone demineralization. Sclerotic changes in the vertebral bodies likely is degenerative. No destructive bone lesions.  IMPRESSION: High-grade small bowel obstruction with dilatation of fluid-filled small bowel and stomach. Distal decompression. Stool-filled colon. No free air. Multiple additional findings demonstrated and discussed in the body of the report.   Electronically Signed   By: Lucienne Capers M.D.   On: 04/09/2014 05:26   Dg Abd Portable 1v  04/09/2014   CLINICAL DATA:  Abdominal pain. Possible free air on previous supine images.  EXAM: PORTABLE ABDOMEN - 1 VIEW   COMPARISON:  Supine abdomen 04/09/2014.  FINDINGS: Technically limited study due to patient's motion. Gas distended colon likely representing ileus. Gas in the right upper quadrant consistent with colonic interposition. No definite evidence of free air.  IMPRESSION: Gas distended colon likely representing ileus. Gas in the right upper quadrant probably representing colonic interposition. No definite evidence of free air.   Electronically Signed   By: Lucienne Capers M.D.   On: 04/09/2014 02:33   Dg Abd Portable 1v  04/09/2014   CLINICAL DATA:  Initial evaluation for abdominal pain with distension, nausea, vomiting.  EXAM: PORTABLE ABDOMEN - 1 VIEW  COMPARISON:  Prior CT from 09/25/2009.  FINDINGS: Multiple dilated loops of gas-filled small bowel are seen scattered throughout the abdomen. These measure up to 5.8 cm in diameter. The gas-filled colon in the right upper quadrant is mildly prominent is well. There is a paucity of gas distally. Findings are suggestive of possible ileus, although a distal obstructive process could also have this appearance. Gas lucency at the right hemidiaphragm is favored to reflect colonic interposition with superimposed right basilar atelectasis. No definite free air identified, although this could be confirmed within upright projection.  No soft tissue mass identified. Scattered surgical clips present within the abdomen.  Extensive multilevel degenerative disease present within the lumbar spine with irregular osteophytic spurring. Irregular calcifications adjacent to the L2 vertebral body may be vascular in nature.  IMPRESSION: 1. Multiple prominent gas-filled loops of small and large bowel within the abdomen. Findings are favored to reflect ileus, although possible distal obstructive process could also be considered in the correct clinical setting. 2. Gas lucency overlying the right hemidiaphragm, favored to reflect colonic interposition with superimposed right basilar atelectasis  on these supine films. Further evaluation with upright film to ensure no free intraperitoneal air delete  present is recommended. Alternatively, further evaluation with cross-sectional imaging could be performed for further evaluation. Results were called by telephone at the time of interpretation on 04/09/2014 at 1:55 am to Dr. Ripley Fraise , who verbally acknowledged these results.   Electronically Signed   By: Jeannine Boga M.D.   On: 04/09/2014 01:58    Review of Systems  Unable to perform ROS: dementia  he was able to answer some questions and denied chest pain, noted lower abdominal pain, but was not able to fully participate in review of systems Blood pressure 111/73, pulse 46, temperature 97.8 F (36.6 C), temperature source Oral, resp. rate 25, SpO2 98 %. Physical Exam  Constitutional: He appears well-developed. No distress.  HENT:  Head: Normocephalic and atraumatic.  Oropharynx somewhat dry, nasogastric tube in place  Eyes: EOM are normal. Right eye exhibits no discharge. Left eye exhibits no discharge.  Neck: Neck supple. No tracheal deviation present.  Cardiovascular:  Irregular paced rhythm  Respiratory: Effort normal and breath sounds normal. No stridor. No respiratory distress. He has no wheezes.  GI: Soft. He exhibits distension. There is no tenderness. There is no rebound and no guarding.  Nasogastric tube has put out over a liter of brown liquid, no abdominal tenderness, a few bowel sounds are present, midline scar  Musculoskeletal:  Decreased range of motion right shoulder  Neurological:  Alert, on detailed questioning is oriented to the place where he lives and who the president is, follows commands, moderate dementia is apparent  Skin: Skin is warm.  Psychiatric:  See above    Assessment/Plan: Small bowel obstruction - nontender with normal white blood cell count. Agree with medical admission and nasogastric tube decompression, IV hydration. Hopefully his  acute kidney injury will improve. If his obstruction does not resolve with conservative measures, may require a discussion with his family regarding possible surgery. I discussed his case in person with Dr. Candiss Norse at the bedside. We will follow.  Daila Elbert E 04/09/2014, 7:46 AM

## 2014-04-09 NOTE — ED Notes (Signed)
Attempted to call report x2. Left call back number of [phone] for Ed, or Doroteo Bradford, Therapist, sports.

## 2014-04-09 NOTE — ED Notes (Signed)
Talked with Admitting on admission status; pt. To be admitted to [facility] after [facility] re-evaluates pt. Rapid Response Called.

## 2014-04-09 NOTE — ED Notes (Signed)
To ED via [address] [address], from [facility], with abd pain and distension, with vomiting -- smells like stool-- pt is alert, oriented to place.

## 2014-04-09 NOTE — H&P (Signed)
Patient Demographics  Edward Harvey, is a 80 y.o. male  MRN: [id number]   DOB - 12-22-1915  Admit Date - 04/09/2014  Outpatient Primary MD for the patient is Noralee Space, MD   With History of -  Past Medical History  Diagnosis Date  . Glaucoma   . Acute bronchitis   . Hypertension   . CAD (coronary artery disease)     nuclear stress test 2006 inferoseptal and apical infarct ejection fraction 48%  . Atrioventricular block, complete   . Diverticulosis of colon with hemorrhage   . Hemorrhage of gastrointestinal tract, unspecified   . Unspecified intestinal obstruction   . Inguinal hernia   . Gallstone   . DJD (degenerative joint disease)   . Gout   . Hematoma     subdural  . Internal hemorrhoids without mention of complication   . Pacemaker   . Prostate cancer     radiation therapy in 1996/notes 10/29/2001 (05/28/2013)  . Colitis       Past Surgical History  Procedure Laterality Date  . Pacemaker placement    . Cataract extraction, bilateral    . Subtotal colectomy  12/03    Dr. Lucia Gaskins  . Lipoma excision  2001    left leg  . Burr hole for subdural hematoma      "he's had 4 ORs"/notes 10/29/2001 (05/28/2013)  . Insert / replace / remove pacemaker  1994; 11/2004    Archie Endo 10/29/2001; generator change/notes 11/23/2004  (05/28/2013)  . Pericardiocentesis  1994    post pacemaker placement/notes 03/12/2005 (05/28/2013)  . Tonsillectomy and adenoidectomy  age 32    03/12/2005 (05/28/2013)  . Colonoscopy  2009    Diverticulosis and Hemorrhoids     in for   Chief Complaint  Patient presents with  . Abdominal Pain  . Emesis     HPI  Edward Harvey  is a 80 y.o. male,  Diverticulosis, subtotal colectomy, heart block and wiring pacemaker placement, Crohn's colitis, chronic diastolic dysfunction with  EF of 50%, prostate cancer postradiation, hypertension, CAD, inguinal hernia in the past, who lives in the nursing home and has been complaining on and off of abdominal pain nausea vomiting for the last few days to weeks, was sent to the ER for ongoing symptoms of nausea vomiting and abdominal pain. In the ER workup was suggested for small bowel obstruction, dehydration with acute renal failure. General surgery was consulted who requested hospitalist to admit.  Patient currently besides chronic right shoulder and right ankle pain along with mild right lower quadrant pain and nausea is symptom-free, he actually appears quite comfortable after NG tube placement and some gastric drainage. He denies any fever or chills, denies any headache or chest pain, he denies any shortness of breath or focal weakness.   Review of Systems    In addition to the HPI above,   No Fever-chills, No Headache, No changes with Vision or hearing, No problems swallowing food or  Liquids, No Chest pain, Cough or Shortness of Breath, + Abdominal pain, + Nausea & Vommitting, Bowel movements are regular, No Blood in stool or Urine, No dysuria, No new skin rashes or bruises, No new joints pains-aches,  No new weakness, tingling, numbness in any extremity, No recent weight gain or loss, No polyuria, polydypsia or polyphagia, No significant Mental Stressors.  A full 10 point Review of Systems was done, except as stated above, all other Review of Systems were negative.   Social History History  Substance Use Topics  . Smoking status: Never Smoker   . Smokeless tobacco: Never Used  . Alcohol Use: No     Comment: Occ wine      Family History Does not recall any colon cancer in the family  Prior to Admission medications   Medication Sig Start Date End Date Taking? Authorizing Provider  acetaminophen (TYLENOL) 325 MG tablet Take 2 tablets (650 mg total) by mouth every 6 (six) hours as needed for mild pain (or Fever  >/= 101). 08/14/13  Yes Delfina Redwood, MD  allopurinol (ZYLOPRIM) 300 MG tablet Take 300 mg by mouth every morning.    Yes Historical Provider, MD  aspirin 81 MG chewable tablet Chew 81 mg by mouth every evening.    Yes Historical Provider, MD  brimonidine (ALPHAGAN P) 0.1 % SOLN Place 1 drop into both eyes 2 (two) times daily.    Yes Historical Provider, MD  Cholecalciferol (VITAMIN D) 1000 UNITS capsule Take 1,000 Units by mouth every morning.    Yes Historical Provider, MD  diphenoxylate-atropine (LOMOTIL) 2.5-0.025 MG per tablet Take 2 tablets by mouth 4 (four) times daily. For 7 days, then 2 tab po qid prn diarrhea. Hold for constipation 08/14/13  Yes Delfina Redwood, MD  furosemide (LASIX) 20 MG tablet Take 1 tablet (20 mg total) by mouth every other day. 05/29/13  Yes Barton Dubois, MD  lidocaine (LIDODERM) 5 % Place 1 patch onto the skin daily. Remove & Discard patch within 12 hours or as directed by MD   Yes Historical Provider, MD  mesalamine (LIALDA) 1.2 G EC tablet Take 2.4 g by mouth 2 (two) times daily. 08/04/13  Yes Amy S Esterwood, PA-C  Multiple Vitamin (MULTIVITAMIN WITH MINERALS) TABS tablet Take 1 tablet by mouth daily.   Yes Historical Provider, MD  ondansetron (ZOFRAN) 4 MG tablet Take 4 mg by mouth every 6 (six) hours as needed for nausea or vomiting.   Yes Historical Provider, MD  tamsulosin (FLOMAX) 0.4 MG CAPS capsule Take 0.4 mg by mouth daily after supper.   Yes Historical Provider, MD  Trospium Chloride (SANCTURA XR) 60 MG CP24 Take 60 mg by mouth every evening.    Yes Historical Provider, MD  vitamin C (ASCORBIC ACID) 500 MG tablet Take 1 tablet (500 mg total) by mouth daily. 08/14/13  Yes Delfina Redwood, MD  feeding supplement, ENSURE COMPLETE, (ENSURE COMPLETE) LIQD Take 237 mLs by mouth 3 (three) times daily between meals. 09/13/13   Nishant Dhungel, MD  Multiple Vitamins-Minerals (CENTRUM SILVER PO) Take 1 tablet by mouth every morning.     Historical Provider,  MD  pantoprazole (PROTONIX) 40 MG tablet Take 1 tablet (40 mg total) by mouth daily at 12 noon. 09/13/13   Nishant Dhungel, MD    No Known Allergies  Physical Exam  Vitals  Blood pressure 111/73, pulse 46, temperature 97.8 F (36.6 C), temperature source Oral, resp. rate 25, SpO2 98 %.   1. General  elderly African-American male lying in bed in NAD,    2. Normal affect and insight, Not Suicidal or Homicidal, Awake   Oriented X 2  3. No F.N deficits, ALL C.Nerves Intact, Strength 5/5 all 4 extremities, Sensation intact all 4 extremities, Plantars down going.  4. Ears and Eyes appear Normal, Conjunctivae clear, PERRLA. Moist Oral Mucosa.  5. Supple Neck, No JVD, No cervical lymphadenopathy appriciated, No Carotid Bruits.  6. Symmetrical Chest Pfeifle movement, Good air movement bilaterally, CTAB.  7. RRR, No Gallops, Rubs or Murmurs, No Parasternal Heave.  8. Positive Bowel Sounds, Abdomen Soft but distended, Mild RLQ tenderness, No organomegaly appriciated,No rebound -guarding or rigidity.  9.  No Cyanosis, Normal Skin Turgor, No Skin Rash or Bruise.  10. Good muscle tone,  joints appear normal , no effusions, Normal ROM.  11. No Palpable Lymph Nodes in Neck or Axillae     Data Review  CBC  Recent Labs Lab 04/09/14 0147  WBC 10.2  HGB 12.6*  HCT 36.5*  PLT 182  MCV 90.3  MCH 31.2  MCHC 34.5  RDW 16.5*  LYMPHSABS 0.8  MONOABS 0.9  EOSABS 0.0  BASOSABS 0.0   ------------------------------------------------------------------------------------------------------------------  Chemistries   Recent Labs Lab 04/09/14 0147  NA 141  K 4.9  CL 92*  CO2 29  GLUCOSE 148*  BUN 61*  CREATININE 2.54*  CALCIUM 10.0  AST 24  ALT 21  ALKPHOS 78  BILITOT 0.6   ------------------------------------------------------------------------------------------------------------------ CrCl cannot be calculated (Unknown ideal  weight.). ------------------------------------------------------------------------------------------------------------------ No results for input(s): TSH, T4TOTAL, T3FREE, THYROIDAB in the last 72 hours.  Invalid input(s): FREET3   Coagulation profile No results for input(s): INR, PROTIME in the last 168 hours. ------------------------------------------------------------------------------------------------------------------- No results for input(s): DDIMER in the last 72 hours. -------------------------------------------------------------------------------------------------------------------  Cardiac Enzymes  Recent Labs Lab 04/09/14 0147  TROPONINI <0.30   ------------------------------------------------------------------------------------------------------------------ Invalid input(s): POCBNP   ---------------------------------------------------------------------------------------------------------------  Urinalysis    Component Value Date/Time   COLORURINE ORANGE* 04/09/2014 0543   APPEARANCEUR CLEAR 04/09/2014 0543   LABSPEC 1.025 04/09/2014 0543   PHURINE 5.0 04/09/2014 0543   GLUCOSEU NEGATIVE 04/09/2014 0543   HGBUR NEGATIVE 04/09/2014 0543   BILIRUBINUR SMALL* 04/09/2014 0543   KETONESUR 15* 04/09/2014 0543   PROTEINUR NEGATIVE 04/09/2014 0543   UROBILINOGEN 0.2 04/09/2014 0543   NITRITE NEGATIVE 04/09/2014 0543   LEUKOCYTESUR TRACE* 04/09/2014 0543    ----------------------------------------------------------------------------------------------------------------  Imaging results:   Ct Abdomen Pelvis Wo Contrast  04/09/2014   CLINICAL DATA:  Abdominal pain, nausea, and vomiting.  EXAM: CT ABDOMEN AND PELVIS WITHOUT CONTRAST  TECHNIQUE: Multidetector CT imaging of the abdomen and pelvis was performed following the standard protocol without IV contrast.  COMPARISON:  09/25/2009  FINDINGS: Atelectasis or consolidation in both lung bases with bronchiectasis.  Cardiac enlargement.  Go distention of fluid filled stomach and small bowel. Distal decompression of small bowel. Stool-filled colon. Changes consistent with high-grade small bowel obstruction. Cause is not identified. No free fluid or free air in the abdomen.  The unenhanced appearance of the liver, abdominal aorta, inferior vena cava, and retroperitoneal lymph nodes is unremarkable. Surgical absence of the gallbladder. Small gas collections in the gallbladder fossa probably represent pneumobilia. This is likely postoperative although fistula is not excluded. Diffuse pancreatic ductal dilatation with pancreatic calcifications suggesting chronic pancreatitis. Peripherally calcified mass in the region of the head of the pancreas measures 1.9 cm diameter. This could represent an aneurysm or pancreatic mass. Diffuse pancreatic ductal dilatation is present. Low-attenuation lesion in the  superior aspect of the spleen measuring 3.9 cm diameter, probably representing a cyst. Probable cysts in both kidneys without hydronephrosis.  Pelvis: Bladder Mcneese is not thickened. No free or loculated pelvic fluid collections. Small bilateral inguinal hernias containing fat. Right inguinal hernia also contains a portion of the bladder. No pelvic mass or lymphadenopathy. Appendix is not demonstrated. Markedly diffuse degenerative changes from throughout the lumbar spine. Diffuse bone demineralization. Sclerotic changes in the vertebral bodies likely is degenerative. No destructive bone lesions.  IMPRESSION: High-grade small bowel obstruction with dilatation of fluid-filled small bowel and stomach. Distal decompression. Stool-filled colon. No free air. Multiple additional findings demonstrated and discussed in the body of the report.   Electronically Signed   By: Lucienne Capers M.D.   On: 04/09/2014 05:26   Dg Abd Portable 1v  04/09/2014   CLINICAL DATA:  Abdominal pain. Possible free air on previous supine images.  EXAM: PORTABLE  ABDOMEN - 1 VIEW  COMPARISON:  Supine abdomen 04/09/2014.  FINDINGS: Technically limited study due to patient's motion. Gas distended colon likely representing ileus. Gas in the right upper quadrant consistent with colonic interposition. No definite evidence of free air.  IMPRESSION: Gas distended colon likely representing ileus. Gas in the right upper quadrant probably representing colonic interposition. No definite evidence of free air.   Electronically Signed   By: Lucienne Capers M.D.   On: 04/09/2014 02:33   Dg Abd Portable 1v  04/09/2014   CLINICAL DATA:  Initial evaluation for abdominal pain with distension, nausea, vomiting.  EXAM: PORTABLE ABDOMEN - 1 VIEW  COMPARISON:  Prior CT from 09/25/2009.  FINDINGS: Multiple dilated loops of gas-filled small bowel are seen scattered throughout the abdomen. These measure up to 5.8 cm in diameter. The gas-filled colon in the right upper quadrant is mildly prominent is well. There is a paucity of gas distally. Findings are suggestive of possible ileus, although a distal obstructive process could also have this appearance. Gas lucency at the right hemidiaphragm is favored to reflect colonic interposition with superimposed right basilar atelectasis. No definite free air identified, although this could be confirmed within upright projection.  No soft tissue mass identified. Scattered surgical clips present within the abdomen.  Extensive multilevel degenerative disease present within the lumbar spine with irregular osteophytic spurring. Irregular calcifications adjacent to the L2 vertebral body may be vascular in nature.  IMPRESSION: 1. Multiple prominent gas-filled loops of small and large bowel within the abdomen. Findings are favored to reflect ileus, although possible distal obstructive process could also be considered in the correct clinical setting. 2. Gas lucency overlying the right hemidiaphragm, favored to reflect colonic interposition with superimposed right  basilar atelectasis on these supine films. Further evaluation with upright film to ensure no free intraperitoneal air delete present is recommended. Alternatively, further evaluation with cross-sectional imaging could be performed for further evaluation. Results were called by telephone at the time of interpretation on 04/09/2014 at 1:55 am to Dr. Ripley Fraise , who verbally acknowledged these results.   Electronically Signed   By: Jeannine Boga M.D.   On: 04/09/2014 01:58    My personal review of EKG: Rhythm - Paced rhythm     Assessment & Plan   1. Small bowel obstruction. Likely related to intestinal lesions from previous partial colectomy, history of Crohn's colitis. Will be admitted to Telemetry bed, nothing by mouth, gentle IV fluids, NG tube to intermittent suction for decompression, general surgery following, he does not have a white count or fever, doubt  this is Crohn's colitis related problem.    2. CAD and hypertension. No acute issues chest pain-free, as needed IV Lopressor and hydralazine.    3. History of AV block. Status post pacemaker placement. has paced rhythm no acute issue, telemetry monitor.    4.GERD. IV PPI.    5. ARF. Secondary to #1 above, IV fluid, repeat BMP in the morning avoid nephrotoxins.     DVT Prophylaxis Heparin   AM Labs Ordered, also please review Full Orders  Family Communication: Admission, patients condition and plan of care including tests being ordered have been discussed with the patient who indicates understanding and agree with the plan and Code Status.  Code Status Full   Likely DC to  SNF  Condition GUARDED    Time spent in minutes : 35    SINGH,PRASHANT K M.D on 04/09/2014 at 7:49 AM  Between 7am to 7pm - Pager - [phone]  After 7pm go to [email or web address] - password [email or web address]  And look for the night coverage person covering me after hours  [facility] Office  [phone]

## 2014-04-10 DIAGNOSIS — K5669 Other intestinal obstruction: Secondary | ICD-10-CM

## 2014-04-10 DIAGNOSIS — N179 Acute kidney failure, unspecified: Secondary | ICD-10-CM | POA: Diagnosis present

## 2014-04-10 DIAGNOSIS — D649 Anemia, unspecified: Secondary | ICD-10-CM | POA: Diagnosis present

## 2014-04-10 LAB — CBC
HEMATOCRIT: 31.8 % — AB (ref 39.0–52.0)
Hemoglobin: 10.4 g/dL — ABNORMAL LOW (ref 13.0–17.0)
MCH: 30.4 pg (ref 26.0–34.0)
MCHC: 32.7 g/dL (ref 30.0–36.0)
MCV: 93 fL (ref 78.0–100.0)
Platelets: 147 10*3/uL — ABNORMAL LOW (ref 150–400)
RBC: 3.42 MIL/uL — ABNORMAL LOW (ref 4.22–5.81)
RDW: 16.9 % — AB (ref 11.5–15.5)
WBC: 5.6 10*3/uL (ref 4.0–10.5)

## 2014-04-10 LAB — BASIC METABOLIC PANEL
ANION GAP: 13 (ref 5–15)
BUN: 67 mg/dL — ABNORMAL HIGH (ref 6–23)
CALCIUM: 8.2 mg/dL — AB (ref 8.4–10.5)
CO2: 28 meq/L (ref 19–32)
CREATININE: 2.15 mg/dL — AB (ref 0.50–1.35)
Chloride: 103 mEq/L (ref 96–112)
GFR calc Af Amer: 28 mL/min — ABNORMAL LOW (ref >90)
GFR calc non Af Amer: 24 mL/min — ABNORMAL LOW (ref >90)
Glucose, Bld: 95 mg/dL (ref 70–99)
Potassium: 4 mEq/L (ref 3.7–5.3)
Sodium: 144 mEq/L (ref 137–147)

## 2014-04-10 MED ORDER — PANTOPRAZOLE SODIUM 40 MG IV SOLR
40.0000 mg | INTRAVENOUS | Status: DC
Start: 2014-04-11 — End: 2014-04-14
  Administered 2014-04-11 – 2014-04-13 (×3): 40 mg via INTRAVENOUS
  Filled 2014-04-10 (×5): qty 40

## 2014-04-10 MED ORDER — SODIUM CHLORIDE 0.9 % IV SOLN
INTRAVENOUS | Status: DC
  Administered 2014-04-10: 20:00:00 via INTRAVENOUS

## 2014-04-10 NOTE — Progress Notes (Signed)
[facility] Progress Note     Subjective: Pt demented, says he doesn't have any pain or nausea.  Knows his name and that Mady Gemma is the president, but not otherwise oriented.  NG had 5025mL/24 hr.  No BM's, he's unsure of if he's passed flatus.  Objective: Vital signs in last 24 hours: Temp:  [97.9 F (36.6 C)-98.7 F (37.1 C)] 98.2 F (36.8 C) (11/08 0438) Pulse Rate:  [49-90] 85 (11/08 0438) Resp:  [18-20] 20 (11/08 0438) BP: (83-106)/(48-58) 106/50 mmHg (11/08 0438) SpO2:  [91 %-100 %] 94 % (11/08 0438) Weight:  [185 lb 10 oz (84.2 kg)-187 lb 13.3 oz (85.2 kg)] 187 lb 13.3 oz (85.2 kg) (11/08 0438) Last BM Date:  (unknown)  Intake/Output from previous day: 11/07 0701 - 11/08 0700 In: 1580 [I.V.:1020; NG/GT:60; IV Piggyback:500] Out: 6650 [Urine:400; Emesis/NG output:5050] Intake/Output this shift:    PE: Gen:  Alert, NAD, pleasant Abd: Soft, distended, NT, diminished BS, no HSM, abdominal scars noted   Lab Results:   Recent Labs  04/09/14 0147 04/10/14 0438  WBC 10.2 5.6  HGB 12.6* 10.4*  HCT 36.5* 31.8*  PLT 182 147*   BMET  Recent Labs  04/09/14 0147 04/10/14 0438  NA 141 144  K 4.9 4.0  CL 92* 103  CO2 29 28  GLUCOSE 148* 95  BUN 61* 67*  CREATININE 2.54* 2.15*  CALCIUM 10.0 8.2*   PT/INR No results for input(s): LABPROT, INR in the last 72 hours. CMP     Component Value Date/Time   NA 144 04/10/2014 0438   K 4.0 04/10/2014 0438   CL 103 04/10/2014 0438   CO2 28 04/10/2014 0438   GLUCOSE 95 04/10/2014 0438   BUN 67* 04/10/2014 0438   CREATININE 2.15* 04/10/2014 0438   CALCIUM 8.2* 04/10/2014 0438   PROT 8.5* 04/09/2014 0147   ALBUMIN 3.1* 04/09/2014 0147   AST 24 04/09/2014 0147   ALT 21 04/09/2014 0147   ALKPHOS 78 04/09/2014 0147   BILITOT 0.6 04/09/2014 0147   GFRNONAA 24* 04/10/2014 0438   GFRAA 28* 04/10/2014 0438   Lipase     Component Value Date/Time   LIPASE 21 04/09/2014 0147       Studies/Results: Ct  Abdomen Pelvis Wo Contrast  04/09/2014   CLINICAL DATA:  Abdominal pain, nausea, and vomiting.  EXAM: CT ABDOMEN AND PELVIS WITHOUT CONTRAST  TECHNIQUE: Multidetector CT imaging of the abdomen and pelvis was performed following the standard protocol without IV contrast.  COMPARISON:  09/25/2009  FINDINGS: Atelectasis or consolidation in both lung bases with bronchiectasis. Cardiac enlargement.  Go distention of fluid filled stomach and small bowel. Distal decompression of small bowel. Stool-filled colon. Changes consistent with high-grade small bowel obstruction. Cause is not identified. No free fluid or free air in the abdomen.  The unenhanced appearance of the liver, abdominal aorta, inferior vena cava, and retroperitoneal lymph nodes is unremarkable. Surgical absence of the gallbladder. Small gas collections in the gallbladder fossa probably represent pneumobilia. This is likely postoperative although fistula is not excluded. Diffuse pancreatic ductal dilatation with pancreatic calcifications suggesting chronic pancreatitis. Peripherally calcified mass in the region of the head of the pancreas measures 1.9 cm diameter. This could represent an aneurysm or pancreatic mass. Diffuse pancreatic ductal dilatation is present. Low-attenuation lesion in the superior aspect of the spleen measuring 3.9 cm diameter, probably representing a cyst. Probable cysts in both kidneys without hydronephrosis.  Pelvis: Bladder Smet is not thickened. No free or loculated pelvic  fluid collections. Small bilateral inguinal hernias containing fat. Right inguinal hernia also contains a portion of the bladder. No pelvic mass or lymphadenopathy. Appendix is not demonstrated. Markedly diffuse degenerative changes from throughout the lumbar spine. Diffuse bone demineralization. Sclerotic changes in the vertebral bodies likely is degenerative. No destructive bone lesions.  IMPRESSION: High-grade small bowel obstruction with dilatation of  fluid-filled small bowel and stomach. Distal decompression. Stool-filled colon. No free air. Multiple additional findings demonstrated and discussed in the body of the report.   Electronically Signed   By: Lucienne Capers M.D.   On: 04/09/2014 05:26   Dg Abd Portable 1v  04/09/2014   CLINICAL DATA:  Abdominal pain. Possible free air on previous supine images.  EXAM: PORTABLE ABDOMEN - 1 VIEW  COMPARISON:  Supine abdomen 04/09/2014.  FINDINGS: Technically limited study due to patient's motion. Gas distended colon likely representing ileus. Gas in the right upper quadrant consistent with colonic interposition. No definite evidence of free air.  IMPRESSION: Gas distended colon likely representing ileus. Gas in the right upper quadrant probably representing colonic interposition. No definite evidence of free air.   Electronically Signed   By: Lucienne Capers M.D.   On: 04/09/2014 02:33   Dg Abd Portable 1v  04/09/2014   CLINICAL DATA:  Initial evaluation for abdominal pain with distension, nausea, vomiting.  EXAM: PORTABLE ABDOMEN - 1 VIEW  COMPARISON:  Prior CT from 09/25/2009.  FINDINGS: Multiple dilated loops of gas-filled small bowel are seen scattered throughout the abdomen. These measure up to 5.8 cm in diameter. The gas-filled colon in the right upper quadrant is mildly prominent is well. There is a paucity of gas distally. Findings are suggestive of possible ileus, although a distal obstructive process could also have this appearance. Gas lucency at the right hemidiaphragm is favored to reflect colonic interposition with superimposed right basilar atelectasis. No definite free air identified, although this could be confirmed within upright projection.  No soft tissue mass identified. Scattered surgical clips present within the abdomen.  Extensive multilevel degenerative disease present within the lumbar spine with irregular osteophytic spurring. Irregular calcifications adjacent to the L2 vertebral body  may be vascular in nature.  IMPRESSION: 1. Multiple prominent gas-filled loops of small and large bowel within the abdomen. Findings are favored to reflect ileus, although possible distal obstructive process could also be considered in the correct clinical setting. 2. Gas lucency overlying the right hemidiaphragm, favored to reflect colonic interposition with superimposed right basilar atelectasis on these supine films. Further evaluation with upright film to ensure no free intraperitoneal air delete present is recommended. Alternatively, further evaluation with cross-sectional imaging could be performed for further evaluation. Results were called by telephone at the time of interpretation on 04/09/2014 at 1:55 am to Dr. Ripley Fraise , who verbally acknowledged these results.   Electronically Signed   By: Jeannine Boga M.D.   On: 04/09/2014 01:58    Anti-infectives: Anti-infectives    None       Assessment/Plan SBO H/o subtotal colectomy 2003 Multiple medical problems  Plan: 1.  IVF, pain control, antiemetics, NPO with NG tube 2.  Ambulate and IS if able, to aid in recovery 3.  SCD's and heparin 4.  Hopefully he will continue to improve without surgical interventions.  He is at high risk given his age for any surgical interventions.  He seems improved today, but his output is >5L in the last 24hours.  The output is brown feculent and foul smelling. 5.  Ordered  PT/OT, to try to get him moving 6.  Will follow    LOS: 1 day    DORT, Trusten Hume 04/10/2014, 8:51 AM Pager: [phone]

## 2014-04-10 NOTE — Progress Notes (Signed)
Patient sleeping peacefully. NGT intact and maintained to low intermittent suction and is patent for dark brown drainage. Patient maintained on the monitor and is V-paced with heart rate of 89. Will continue to monitor.  Esperanza Heir, RN

## 2014-04-10 NOTE — Plan of Care (Signed)
Problem: Phase I Progression Outcomes Goal: Voiding-avoid urinary catheter unless indicated Outcome: Progressing Voiding sufficient amounts of amber urine

## 2014-04-10 NOTE — Progress Notes (Signed)
Chart reviewed.   [facility] PROGRESS NOTE  Edward Harvey DGL:[id number] DOB: [birth year] DOA: 04/09/2014 PCP: Noralee Space, MD  Assessment/Plan:  Principal Problem:   SBO (small bowel obstruction) Active Problems:   Gout   Essential hypertension   Coronary atherosclerosis   AV BLOCK, COMPLETE   PPM-St.Jude   Senile dementia   Atrial fibrillation   Crohn's colitis   Dehydration   Severe protein-calorie malnutrition   BPH (benign prostatic hyperplasia)   ARF (acute renal failure)  NG with high output. May require surgery. Creatinine down slightly.  Continue IVF  Code Status:  full Family Communication:   Disposition Plan:    Consultants:  CCS  Procedures:     Antibiotics:    HPI/Subjective: No complaints  Objective: Filed Vitals:   04/10/14 1430  BP: 97/51  Pulse:   Temp: 97.5 F (36.4 C)  Resp: 18    Intake/Output Summary (Last 24 hours) at 04/10/14 1634 Last data filed at 04/10/14 1500  Gross per 24 hour  Intake   1020 ml  Output   1125 ml  Net   -105 ml   Filed Weights   04/09/14 0903 04/10/14 0438  Weight: 84.2 kg (185 lb 10 oz) 85.2 kg (187 lb 13.3 oz)   Tele: paced  Exam:   General:  In chair. HOH. Alert  HEENT:  NG draining brown  Cardiovascular: RRR without MGR  Respiratory: CTA without WRR  Abdomen: S, NT, ND. BS present  Ext: no CCE  Basic Metabolic Panel:  Recent Labs Lab 04/09/14 0147 04/10/14 0438  NA 141 144  K 4.9 4.0  CL 92* 103  CO2 29 28  GLUCOSE 148* 95  BUN 61* 67*  CREATININE 2.54* 2.15*  CALCIUM 10.0 8.2*   Liver Function Tests:  Recent Labs Lab 04/09/14 0147  AST 24  ALT 21  ALKPHOS 78  BILITOT 0.6  PROT 8.5*  ALBUMIN 3.1*    Recent Labs Lab 04/09/14 0147  LIPASE 21   No results for input(s): AMMONIA in the last 168 hours. CBC:  Recent Labs Lab 04/09/14 0147 04/10/14 0438  WBC 10.2 5.6  NEUTROABS 8.5*  --   HGB 12.6* 10.4*  HCT 36.5* 31.8*  MCV 90.3 93.0  PLT  182 147*   Cardiac Enzymes:  Recent Labs Lab 04/09/14 0147  TROPONINI <0.30   BNP (last 3 results)  Recent Labs  05/26/13 2127 08/11/13 1915  PROBNP 278.5 188.8   CBG: No results for input(s): GLUCAP in the last 168 hours.  Recent Results (from the past 240 hour(s))  MRSA PCR Screening     Status: None   Collection Time: 04/09/14  2:33 PM  Result Value Ref Range Status   MRSA by PCR NEGATIVE NEGATIVE Final    Comment:        The GeneXpert MRSA Assay (FDA approved for NASAL specimens only), is one component of a comprehensive MRSA colonization surveillance program. It is not intended to diagnose MRSA infection nor to guide or monitor treatment for MRSA infections.      Studies: Ct Abdomen Pelvis Wo Contrast  04/09/2014   CLINICAL DATA:  Abdominal pain, nausea, and vomiting.  EXAM: CT ABDOMEN AND PELVIS WITHOUT CONTRAST  TECHNIQUE: Multidetector CT imaging of the abdomen and pelvis was performed following the standard protocol without IV contrast.  COMPARISON:  09/25/2009  FINDINGS: Atelectasis or consolidation in both lung bases with bronchiectasis. Cardiac enlargement.  Go distention of fluid filled stomach and small bowel. Distal decompression  of small bowel. Stool-filled colon. Changes consistent with high-grade small bowel obstruction. Cause is not identified. No free fluid or free air in the abdomen.  The unenhanced appearance of the liver, abdominal aorta, inferior vena cava, and retroperitoneal lymph nodes is unremarkable. Surgical absence of the gallbladder. Small gas collections in the gallbladder fossa probably represent pneumobilia. This is likely postoperative although fistula is not excluded. Diffuse pancreatic ductal dilatation with pancreatic calcifications suggesting chronic pancreatitis. Peripherally calcified mass in the region of the head of the pancreas measures 1.9 cm diameter. This could represent an aneurysm or pancreatic mass. Diffuse pancreatic ductal  dilatation is present. Low-attenuation lesion in the superior aspect of the spleen measuring 3.9 cm diameter, probably representing a cyst. Probable cysts in both kidneys without hydronephrosis.  Pelvis: Bladder Chern is not thickened. No free or loculated pelvic fluid collections. Small bilateral inguinal hernias containing fat. Right inguinal hernia also contains a portion of the bladder. No pelvic mass or lymphadenopathy. Appendix is not demonstrated. Markedly diffuse degenerative changes from throughout the lumbar spine. Diffuse bone demineralization. Sclerotic changes in the vertebral bodies likely is degenerative. No destructive bone lesions.  IMPRESSION: High-grade small bowel obstruction with dilatation of fluid-filled small bowel and stomach. Distal decompression. Stool-filled colon. No free air. Multiple additional findings demonstrated and discussed in the body of the report.   Electronically Signed   By: Lucienne Capers M.D.   On: 04/09/2014 05:26   Dg Abd Portable 1v  04/09/2014   CLINICAL DATA:  Abdominal pain. Possible free air on previous supine images.  EXAM: PORTABLE ABDOMEN - 1 VIEW  COMPARISON:  Supine abdomen 04/09/2014.  FINDINGS: Technically limited study due to patient's motion. Gas distended colon likely representing ileus. Gas in the right upper quadrant consistent with colonic interposition. No definite evidence of free air.  IMPRESSION: Gas distended colon likely representing ileus. Gas in the right upper quadrant probably representing colonic interposition. No definite evidence of free air.   Electronically Signed   By: Lucienne Capers M.D.   On: 04/09/2014 02:33   Dg Abd Portable 1v  04/09/2014   CLINICAL DATA:  Initial evaluation for abdominal pain with distension, nausea, vomiting.  EXAM: PORTABLE ABDOMEN - 1 VIEW  COMPARISON:  Prior CT from 09/25/2009.  FINDINGS: Multiple dilated loops of gas-filled small bowel are seen scattered throughout the abdomen. These measure up to 5.8  cm in diameter. The gas-filled colon in the right upper quadrant is mildly prominent is well. There is a paucity of gas distally. Findings are suggestive of possible ileus, although a distal obstructive process could also have this appearance. Gas lucency at the right hemidiaphragm is favored to reflect colonic interposition with superimposed right basilar atelectasis. No definite free air identified, although this could be confirmed within upright projection.  No soft tissue mass identified. Scattered surgical clips present within the abdomen.  Extensive multilevel degenerative disease present within the lumbar spine with irregular osteophytic spurring. Irregular calcifications adjacent to the L2 vertebral body may be vascular in nature.  IMPRESSION: 1. Multiple prominent gas-filled loops of small and large bowel within the abdomen. Findings are favored to reflect ileus, although possible distal obstructive process could also be considered in the correct clinical setting. 2. Gas lucency overlying the right hemidiaphragm, favored to reflect colonic interposition with superimposed right basilar atelectasis on these supine films. Further evaluation with upright film to ensure no free intraperitoneal air delete present is recommended. Alternatively, further evaluation with cross-sectional imaging could be performed for further evaluation.  Results were called by telephone at the time of interpretation on 04/09/2014 at 1:55 am to Dr. Ripley Fraise , who verbally acknowledged these results.   Electronically Signed   By: Jeannine Boga M.D.   On: 04/09/2014 01:58    Scheduled Meds: . bisacodyl  10 mg Rectal Daily  . brimonidine  1 drop Both Eyes BID  . heparin  5,000 Units Subcutaneous 3 times per day  . Influenza vac split quadrivalent PF  0.5 mL Intramuscular Tomorrow-1000  . lidocaine  1 patch Transdermal Q24H  . pantoprazole (PROTONIX) IV  40 mg Intravenous Q12H  . pneumococcal 23 valent vaccine  0.5  mL Intramuscular Tomorrow-1000   Continuous Infusions: . sodium chloride 125 mL/hr at 04/10/14 1324    Time spent: 35 minutes  [facility] Pager [phone]. If 7PM-7AM, please contact night-coverage at [email or web address], password [facility] 04/10/2014, 4:34 PM  LOS: 1 day

## 2014-04-10 NOTE — Progress Notes (Signed)
Utilization Review Completed.   Zaniyah Wernette, RN, BSN Nurse Case Manager  

## 2014-04-10 NOTE — Evaluation (Signed)
Physical Therapy Evaluation Patient Details Name: Edward Harvey MRN: [id number] DOB: 12-06-1915 Today's Date: 04/10/2014   History of Present Illness    80 y.o. male, Diverticulosis, subtotal colectomy, heart block and wiring pacemaker placement, Crohn's colitis, chronic diastolic dysfunction with EF of 50%, prostate cancer postradiation, hypertension, CAD, inguinal hernia in the past, who lives in the nursing home and has been complaining on and off of abdominal pain nausea vomiting for the last few days to weeks, was sent to the ER for ongoing symptoms of nausea vomiting and abdominal pain. In the ER workup was suggested for small bowel obstruction, dehydration with acute renal failure. General surgery was consulted who requested hospitalist to admit.  Patient currently besides chronic right shoulder and right ankle pain along with mild right lower quadrant pain and nausea is symptom-free, he actually appears quite comfortable after NG tube placement and some gastric drainage. He denies any fever or chills, denies any headache or chest pain, he denies any shortness of breath or focal weakness.  Clinical Impression  Pt presents with significant limitations to functional mobility due to limited ROM, strength, muscular endurance, visual dysfunction, as well as contributory medical issues.  Likely near baseline, but pt does report he can normally walk better.  Able to walk with assist to/from bathroom (though no significant BM despite leaking stool) and up to chair after.  Recommend up with nursing (RW left in room) and return to [facility] (SNF) at d/c.  Will provide acute PT services until goals met or d/c to SNF.     Follow Up Recommendations SNF    Equipment Recommendations  None recommended by PT    Recommendations for Other Services       Precautions / Restrictions Precautions Precautions: Fall Precaution Comments: weak, incontinent of bowel/bladder      Mobility  Bed  Mobility Overal bed mobility: Needs Assistance Bed Mobility: Supine to Sit     Supine to sit: Min assist     General bed mobility comments: HOB up, pt needs cues and occasional physical assist to move legs to and off of EOB  Transfers Overall transfer level: Needs assistance Equipment used: Rolling walker (2 wheeled) Transfers: Sit to/from Stand Sit to Stand: Mod assist         General transfer comment: from bed/toilet; to toilet/chair: assist for liftoff and to slow descent, cues for optimal safe hand placement  Ambulation/Gait Ambulation/Gait assistance: Min assist Ambulation Distance (Feet): 25 Feet (to bathroom, sitting break, to recliner near window) Assistive device: Rolling walker (2 wheeled) Gait Pattern/deviations: Trunk flexed;Narrow base of support;Decreased stride length   Gait velocity interpretation: Below normal speed for age/gender General Gait Details: slow, but no obvious indications of buckling or struggling; dependent on RW  Stairs            Wheelchair Mobility    Modified Rankin (Stroke Patients Only)       Balance Overall balance assessment: Needs assistance Sitting-balance support: Single extremity supported;Feet supported Sitting balance-Leahy Scale: Fair     Standing balance support: Bilateral upper extremity supported;During functional activity Standing balance-Leahy Scale: Poor                               Pertinent Vitals/Pain Pain Assessment: No/denies pain    Home Living Family/patient expects to be discharged to:: Skilled nursing facility                 Additional Comments:  Pt from [facility] SNF    Prior Function Level of Independence: Needs assistance   Gait / Transfers Assistance Needed: pt reports he ambulates SNF with RW   ADL's / Homemaking Assistance Needed: needs (A)         Hand Dominance        Extremity/Trunk Assessment   Upper Extremity Assessment: Generalized weakness            Lower Extremity Assessment: Generalized weakness         Communication   Communication: HOH  Cognition Arousal/Alertness: Awake/alert Behavior During Therapy: WFL for tasks assessed/performed Overall Cognitive Status: History of cognitive impairments - at baseline                      General Comments General comments (skin integrity, edema, etc.): NG tube to Zink suction, RN disconnect; dressing to sacrum, pt found in urine (unable to reach urinal) and leaking stool in sit/stand; cleaned periarea and remade bed; pt sat on toilet but did not have significant bm    Exercises        Assessment/Plan    PT Assessment Patient needs continued PT services  PT Diagnosis Difficulty walking;Generalized weakness   PT Problem List Decreased knowledge of use of DME;Decreased cognition;Decreased mobility;Decreased balance;Decreased activity tolerance;Decreased range of motion;Decreased strength  PT Treatment Interventions Patient/family education;Balance training;Therapeutic exercise;Therapeutic activities;Functional mobility training;Gait training;DME instruction   PT Goals (Current goals can be found in the Care Plan section) Acute Rehab PT Goals Patient Stated Goal: "Thats for them to decide" PT Goal Formulation: Patient unable to participate in goal setting Time For Goal Achievement: 04/24/14 Potential to Achieve Goals: Fair    Frequency Min 2X/week   Barriers to discharge        Co-evaluation               End of Session Equipment Utilized During Treatment: Gait belt Activity Tolerance: Patient tolerated treatment well Patient left: in chair;with call bell/phone within reach Nurse Communication: Mobility status         Time: 8675-4492 PT Time Calculation (min): 35 min   Charges:   PT Evaluation $Initial PT Evaluation Tier I: 1 Procedure PT Treatments $Gait Training: 8-22 mins $Therapeutic Activity: 8-22 mins   PT G Codes:           Herbie Drape 04/10/2014, 4:26 PM

## 2014-04-11 ENCOUNTER — Inpatient Hospital Stay (HOSPITAL_COMMUNITY): Payer: Medicare Other

## 2014-04-11 DIAGNOSIS — D649 Anemia, unspecified: Secondary | ICD-10-CM

## 2014-04-11 DIAGNOSIS — E870 Hyperosmolality and hypernatremia: Secondary | ICD-10-CM | POA: Diagnosis not present

## 2014-04-11 DIAGNOSIS — K565 Intestinal adhesions [bands] with obstruction (postprocedural) (postinfection): Secondary | ICD-10-CM | POA: Diagnosis not present

## 2014-04-11 DIAGNOSIS — E876 Hypokalemia: Secondary | ICD-10-CM | POA: Diagnosis not present

## 2014-04-11 LAB — BASIC METABOLIC PANEL
Anion gap: 10 (ref 5–15)
BUN: 49 mg/dL — ABNORMAL HIGH (ref 6–23)
CHLORIDE: 116 meq/L — AB (ref 96–112)
CO2: 28 meq/L (ref 19–32)
Calcium: 7.9 mg/dL — ABNORMAL LOW (ref 8.4–10.5)
Creatinine, Ser: 1.51 mg/dL — ABNORMAL HIGH (ref 0.50–1.35)
GFR calc Af Amer: 43 mL/min — ABNORMAL LOW (ref >90)
GFR calc non Af Amer: 37 mL/min — ABNORMAL LOW (ref >90)
GLUCOSE: 77 mg/dL (ref 70–99)
POTASSIUM: 3.5 meq/L — AB (ref 3.7–5.3)
SODIUM: 154 meq/L — AB (ref 137–147)

## 2014-04-11 LAB — CBC
HCT: 30 % — ABNORMAL LOW (ref 39.0–52.0)
HEMOGLOBIN: 9.7 g/dL — AB (ref 13.0–17.0)
MCH: 30 pg (ref 26.0–34.0)
MCHC: 32.3 g/dL (ref 30.0–36.0)
MCV: 92.9 fL (ref 78.0–100.0)
Platelets: 160 10*3/uL (ref 150–400)
RBC: 3.23 MIL/uL — AB (ref 4.22–5.81)
RDW: 17.5 % — ABNORMAL HIGH (ref 11.5–15.5)
WBC: 6 10*3/uL (ref 4.0–10.5)

## 2014-04-11 MED ORDER — POTASSIUM CL IN DEXTROSE 5% 20 MEQ/L IV SOLN
20.0000 meq | INTRAVENOUS | Status: DC
  Administered 2014-04-11 – 2014-04-14 (×7): 20 meq via INTRAVENOUS
  Filled 2014-04-11 (×10): qty 1000

## 2014-04-11 NOTE — Progress Notes (Signed)
Patient pulled NGT out.  MD on call notified. Per MD, okay to leave NGT out tonight since rounding MD planned to d/c NGT tomorrow. Patient is currently resting comfortably with no signs of distress. Will continue to monitor. Blood pressure 134/67, pulse 70, temperature 97.5 F (36.4 C), temperature source Oral, resp. rate 18, height 5\' 10"  (1.778 m), weight 83.5 kg (184 lb 1.4 oz), SpO2 96 %. Tresa Endo

## 2014-04-11 NOTE — Progress Notes (Signed)
Patient ID: Edward Harvey, male   DOB: 1915-10-22, 80 y.o.   MRN: [id number]    Subjective: Pt feels better today.  Thinks he may be passing some flatus  Objective: Vital signs in last 24 hours: Temp:  [97.5 F (36.4 C)-97.7 F (36.5 C)] 97.7 F (36.5 C) (11/09 0619) Pulse Rate:  [78-92] 78 (11/09 0619) Resp:  [17-18] 17 (11/09 0619) BP: (97-106)/(49-58) 106/49 mmHg (11/09 0619) SpO2:  [93 %-96 %] 93 % (11/09 0619) Weight:  [184 lb 1.4 oz (83.5 kg)] 184 lb 1.4 oz (83.5 kg) (11/09 0619) Last BM Date: 04/10/14  Intake/Output from previous day: 11/08 0701 - 11/09 0700 In: 1000 [IV Piggyback:1000] Out: 1077 [Urine:675; Emesis/NG output:400; Stool:2] Intake/Output this shift:    PE: Abd: soft, NT, ND, hypoactive BS,  NGT with feculent appearing output Heart: regular   Lab Results:   Recent Labs  04/10/14 0438 04/11/14 0354  WBC 5.6 6.0  HGB 10.4* 9.7*  HCT 31.8* 30.0*  PLT 147* 160   BMET  Recent Labs  04/10/14 0438 04/11/14 0354  NA 144 154*  K 4.0 3.5*  CL 103 116*  CO2 28 28  GLUCOSE 95 77  BUN 67* 49*  CREATININE 2.15* 1.51*  CALCIUM 8.2* 7.9*   PT/INR No results for input(s): LABPROT, INR in the last 72 hours. CMP     Component Value Date/Time   NA 154* 04/11/2014 0354   K 3.5* 04/11/2014 0354   CL 116* 04/11/2014 0354   CO2 28 04/11/2014 0354   GLUCOSE 77 04/11/2014 0354   BUN 49* 04/11/2014 0354   CREATININE 1.51* 04/11/2014 0354   CALCIUM 7.9* 04/11/2014 0354   PROT 8.5* 04/09/2014 0147   ALBUMIN 3.1* 04/09/2014 0147   AST 24 04/09/2014 0147   ALT 21 04/09/2014 0147   ALKPHOS 78 04/09/2014 0147   BILITOT 0.6 04/09/2014 0147   GFRNONAA 37* 04/11/2014 0354   GFRAA 43* 04/11/2014 0354   Lipase     Component Value Date/Time   LIPASE 21 04/09/2014 0147       Studies/Results: No results found.  Anti-infectives: Anti-infectives    None       Assessment/Plan  1. SBO  Plan: 1. Repeat abdominal films today.  2. Cont NGT  given amount/character of output and minimal bowel activity.    3. Will follow  LOS: 2 days    Ilma Achee E 04/11/2014, 8:19 AM Pager: [phone]

## 2014-04-11 NOTE — Progress Notes (Signed)
OT Cancellation Note and Discharge  Patient Details Name: Edward Harvey MRN: [id number] DOB: Mar 04, 1916   Cancelled Treatment:    Reason Eval/Treat Not Completed: Other (comment) Pt is Medicare and from SNF with plan to D/C back to SNF. No apparent immediate acute care OT needs, therefore will defer OT to SNF. If OT eval is needed please call Acute Rehab Dept. at [phone] or text page OT at [phone].    Almon Register 04/11/2014, 8:27 AM

## 2014-04-11 NOTE — Plan of Care (Signed)
Problem: Phase I Progression Outcomes Goal: Voiding-avoid urinary catheter unless indicated Outcome: Completed/Met Date Met:  04/11/14     

## 2014-04-11 NOTE — Progress Notes (Signed)
[facility] PROGRESS NOTE  Edward Harvey QJF:[id number] DOB: 03-Apr-1916 DOA: 04/09/2014 PCP: Noralee Space, MD Summary 80 y.o. male, Diverticulosis, subtotal colectomy, heart block and wiring pacemaker placement, Crohn's colitis, chronic diastolic dysfunction with EF of 50%, prostate cancer postradiation, hypertension, CAD, inguinal hernia in the past, who lives in the nursing home and has been complaining on and off of abdominal pain nausea vomiting for the last few days to weeks, was sent to the ER for ongoing symptoms of nausea vomiting and abdominal pain. In the ER workup was suggested for small bowel obstruction, dehydration with acute renal failure. General surgery was consulted who requested hospitalist to admit.  Assessment/Plan:  Principal Problem:   SBO (small bowel obstruction): had stool today and xray improving. And NG output decreasing.  Likely clamp soon, defer to surgery Active Problems:   Hypernatremia: change to hypotonic IVF   Hypokalemia: replete IV   Gout   Essential hypertension   Coronary atherosclerosis   AV BLOCK, COMPLETE   PPM-St.Jude   Senile dementia   Atrial fibrillation   Crohn's colitis, s/p subtotal colectomy   Dehydration   Severe protein-calorie malnutrition   BPH (benign prostatic hyperplasia)   ARF (acute renal failure) resolving   Anemia: no evidence of bleeding. In part dilutional, hemoconcentrated on admission  Code Status:  full Family Communication:   Disposition Plan:    Consultants:  CCS  Procedures:     Antibiotics:    HPI/Subjective: Had bowel movement. No abd pain, describes "discomfort". No n/v  Objective: Filed Vitals:   04/11/14 0619  BP: 106/49  Pulse: 78  Temp: 97.7 F (36.5 C)  Resp: 17    Intake/Output Summary (Last 24 hours) at 04/11/14 1608 Last data filed at 04/11/14 0800  Gross per 24 hour  Intake   1000 ml  Output    402 ml  Net    598 ml   Filed Weights   04/09/14 0903 04/10/14 0438  04/11/14 0619  Weight: 84.2 kg (185 lb 10 oz) 85.2 kg (187 lb 13.3 oz) 83.5 kg (184 lb 1.4 oz)   Tele: paced  Exam:   General:  In chair. HOH. Alert, appropriate  HEENT:  NG draining brown  Cardiovascular: RRR without MGR  Respiratory: CTA without WRR  Abdomen: S, NT, ND. BS present  Ext: no CCE  Basic Metabolic Panel:  Recent Labs Lab 04/09/14 0147 04/10/14 0438 04/11/14 0354  NA 141 144 154*  K 4.9 4.0 3.5*  CL 92* 103 116*  CO2 29 28 28   GLUCOSE 148* 95 77  BUN 61* 67* 49*  CREATININE 2.54* 2.15* 1.51*  CALCIUM 10.0 8.2* 7.9*   Liver Function Tests:  Recent Labs Lab 04/09/14 0147  AST 24  ALT 21  ALKPHOS 78  BILITOT 0.6  PROT 8.5*  ALBUMIN 3.1*    Recent Labs Lab 04/09/14 0147  LIPASE 21   No results for input(s): AMMONIA in the last 168 hours. CBC:  Recent Labs Lab 04/09/14 0147 04/10/14 0438 04/11/14 0354  WBC 10.2 5.6 6.0  NEUTROABS 8.5*  --   --   HGB 12.6* 10.4* 9.7*  HCT 36.5* 31.8* 30.0*  MCV 90.3 93.0 92.9  PLT 182 147* 160   Cardiac Enzymes:  Recent Labs Lab 04/09/14 0147  TROPONINI <0.30   BNP (last 3 results)  Recent Labs  05/26/13 2127 08/11/13 1915  PROBNP 278.5 188.8   CBG: No results for input(s): GLUCAP in the last 168 hours.  Recent Results (from the  past 240 hour(s))  MRSA PCR Screening     Status: None   Collection Time: 04/09/14  2:33 PM  Result Value Ref Range Status   MRSA by PCR NEGATIVE NEGATIVE Final    Comment:        The GeneXpert MRSA Assay (FDA approved for NASAL specimens only), is one component of a comprehensive MRSA colonization surveillance program. It is not intended to diagnose MRSA infection nor to guide or monitor treatment for MRSA infections.      Studies: Dg Abd 2 Views  04/11/2014   CLINICAL DATA:  Small bowel obstruction, weakness.  EXAM: ABDOMEN - 2 VIEW  COMPARISON:  CT abdomen pelvis and abdominal series 11715.  FINDINGS: Nasogastric tube is looped in the  stomach. There is gaseous distension of predominantly small bowel, improved somewhat from 04/09/2014. Probable stool in the rectosigmoid colon. No free air. Pancreatic calcifications are noted.  IMPRESSION: 1. Mild improvement in small bowel distension, without complete resolution of small bowel obstruction. 2. Chronic calcific pancreatitis, better seen on 04/09/2014.   Electronically Signed   By: Lorin Picket M.D.   On: 04/11/2014 11:31    Scheduled Meds: . brimonidine  1 drop Both Eyes BID  . heparin  5,000 Units Subcutaneous 3 times per day  . lidocaine  1 patch Transdermal Q24H  . pantoprazole (PROTONIX) IV  40 mg Intravenous Q24H   Continuous Infusions: . dextrose 5 % with KCl 20 mEq / L 20 mEq (04/11/14 1017)    Time spent: 35 minutes  [facility] Pager [phone]. If 7PM-7AM, please contact night-coverage at [email or web address], password [facility] 04/11/2014, 4:08 PM  LOS: 2 days

## 2014-04-11 NOTE — Clinical Documentation Improvement (Signed)
Labs: 11/07: lactic acid: 3.52.   Possible Diagnosis? . Bacteremia (positive blood cultures only) . Sepsis-specify causative organism if known . Severe sepsis-sepsis with organ dysfunction --Specify organ dysfunction Respiratory failure Encephalopathy Acute kidney failure Other (specify) . SIRS (Systemic Inflammatory Response Syndrome --With or without organ dysfunction . Document septic shock if present . Document any associated diagnoses/conditions    Thank Sherian Maroon Documentation Specialist [phone] Chairty [email or web address]

## 2014-04-12 ENCOUNTER — Inpatient Hospital Stay (HOSPITAL_COMMUNITY): Payer: Medicare Other

## 2014-04-12 DIAGNOSIS — E860 Dehydration: Secondary | ICD-10-CM

## 2014-04-12 LAB — CBC
HCT: 31.3 % — ABNORMAL LOW (ref 39.0–52.0)
Hemoglobin: 10.2 g/dL — ABNORMAL LOW (ref 13.0–17.0)
MCH: 30.5 pg (ref 26.0–34.0)
MCHC: 32.6 g/dL (ref 30.0–36.0)
MCV: 93.7 fL (ref 78.0–100.0)
PLATELETS: 167 10*3/uL (ref 150–400)
RBC: 3.34 MIL/uL — ABNORMAL LOW (ref 4.22–5.81)
RDW: 17.4 % — AB (ref 11.5–15.5)
WBC: 6.4 10*3/uL (ref 4.0–10.5)

## 2014-04-12 LAB — BASIC METABOLIC PANEL
Anion gap: 9 (ref 5–15)
BUN: 30 mg/dL — AB (ref 6–23)
CALCIUM: 8.3 mg/dL — AB (ref 8.4–10.5)
CHLORIDE: 114 meq/L — AB (ref 96–112)
CO2: 29 meq/L (ref 19–32)
CREATININE: 1.15 mg/dL (ref 0.50–1.35)
GFR, EST AFRICAN AMERICAN: 60 mL/min — AB (ref >90)
GFR, EST NON AFRICAN AMERICAN: 52 mL/min — AB (ref >90)
Glucose, Bld: 86 mg/dL (ref 70–99)
POTASSIUM: 3.6 meq/L — AB (ref 3.7–5.3)
SODIUM: 152 meq/L — AB (ref 137–147)

## 2014-04-12 NOTE — Progress Notes (Signed)
Physical Therapy Treatment Patient Details Name: Edward Harvey MRN: [id number] DOB: 1916/02/04 Today's Date: 2014/05/01    History of Present Illness Pt adm with SBO. PMH - Pacer, Crohn's disease, prostate CA, SDH, gout, dementia.    PT Comments    Pt with constant liquid stool with any mobility so unable to perform any mobility other than standing and bed mobility.  Follow Up Recommendations  SNF     Equipment Recommendations  None recommended by PT    Recommendations for Other Services       Precautions / Restrictions Precautions Precautions: Fall Precaution Comments: incontinence Restrictions Weight Bearing Restrictions: No    Mobility  Bed Mobility Overal bed mobility: Needs Assistance Bed Mobility: Rolling;Supine to Sit;Sit to Supine Rolling: Supervision   Supine to sit: Min assist Sit to supine: Min assist   General bed mobility comments: Assist to bring trunk up into sitting and hips to eob. Assist to bring legs back up into bed.  Transfers Overall transfer level: Needs assistance Equipment used: Rolling walker (2 wheeled) Transfers: Sit to/from Stand Sit to Stand: Min assist         General transfer comment: Performed sit to stand repeatedly but pt incontinent of liquid stools repeatedly. Pt cleaned multiple times but continued to have constant liquid stool. Unable to transfer to [facility] or to amb due to this.  Ambulation/Gait             General Gait Details: Unable due to constant liquid stools.   Stairs            Wheelchair Mobility    Modified Rankin (Stroke Patients Only)       Balance Overall balance assessment: Needs assistance Sitting-balance support: No upper extremity supported;Feet supported Sitting balance-Leahy Scale: Fair     Standing balance support: Bilateral upper extremity supported Standing balance-Leahy Scale: Poor Standing balance comment: support of walker and min A for static standing.                    Cognition Arousal/Alertness: Awake/alert Behavior During Therapy: WFL for tasks assessed/performed Overall Cognitive Status: History of cognitive impairments - at baseline                      Exercises      General Comments        Pertinent Vitals/Pain Pain Assessment: No/denies pain    Home Living                      Prior Function            PT Goals (current goals can now be found in the care plan section) Progress towards PT goals: Not progressing toward goals - comment (stool incontinence)    Frequency  Min 2X/week    PT Plan Current plan remains appropriate    Co-evaluation             End of Session Equipment Utilized During Treatment: Gait belt Activity Tolerance: Other (comment) (Limited by constant incontinence of stool.) Patient left: in bed;with call bell/phone within reach;with bed alarm set     Time: 6226-3335 PT Time Calculation (min) (ACUTE ONLY): 30 min  Charges:  $Gait Training: 23-37 mins                    G Codes:      Lilliah Priego 05/01/2014, 11:05 AM  Suanne Marker PT [phone]

## 2014-04-12 NOTE — Progress Notes (Signed)
  Subjective: Pt pulled ng out. Per report he had several BMs. Pt demented  Objective: Vital signs in last 24 hours: Temp:  [97.4 F (36.3 C)-99 F (37.2 C)] 99 F (37.2 C) (11/10 0443) Pulse Rate:  [70-72] 72 (11/10 0443) Resp:  [17-18] 17 (11/10 0443) BP: (114-134)/(58-67) 114/62 mmHg (11/10 0443) SpO2:  [96 %-100 %] 98 % (11/10 0443) Weight:  [183 lb 6.8 oz (83.2 kg)] 183 lb 6.8 oz (83.2 kg) (11/10 0443) Last BM Date: 2014-04-13  Intake/Output from previous day: 04/14/2023 0701 - 11/10 0700 In: 821.7 [I.V.:821.7] Out: 1258 [Urine:1100; Emesis/NG output:155; Stool:3] Intake/Output this shift:    Awake, pleasantly demented Soft, nt, mild distension  Lab Results:   Recent Labs  04-13-14 0354 04/12/14 0650  WBC 6.0 6.4  HGB 9.7* 10.2*  HCT 30.0* 31.3*  PLT 160 167   BMET  Recent Labs  04/10/14 0438 2014-04-13 0354  NA 144 154*  K 4.0 3.5*  CL 103 116*  CO2 28 28  GLUCOSE 95 77  BUN 67* 49*  CREATININE 2.15* 1.51*  CALCIUM 8.2* 7.9*   PT/INR No results for input(s): LABPROT, INR in the last 72 hours. ABG No results for input(s): PHART, HCO3 in the last 72 hours.  Invalid input(s): PCO2, PO2  Studies/Results: Dg Abd 2 Views  04-13-14   CLINICAL DATA:  Small bowel obstruction, weakness.  EXAM: ABDOMEN - 2 VIEW  COMPARISON:  CT abdomen pelvis and abdominal series 11715.  FINDINGS: Nasogastric tube is looped in the stomach. There is gaseous distension of predominantly small bowel, improved somewhat from 04/09/2014. Probable stool in the rectosigmoid colon. No free air. Pancreatic calcifications are noted.  IMPRESSION: 1. Mild improvement in small bowel distension, without complete resolution of small bowel obstruction. 2. Chronic calcific pancreatitis, better seen on 04/09/2014.   Electronically Signed   By: Lorin Picket M.D.   On: 04-13-2014 11:31    Anti-infectives: Anti-infectives    None      Assessment/Plan: pSBO  Clinically improving. Having BMs.  Can't rely on pt since has some dementia. Will check plain films to evaluate bowel gas pattern; if improving, will start clears.   PT/OT  Leighton Ruff. Redmond Pulling, MD, FACS General, Bariatric, & Minimally Invasive Surgery [facility], [place]    LOS: 3 days    Gayland Curry 04/12/2014

## 2014-04-12 NOTE — Progress Notes (Addendum)
[facility] Progress Note   Edward Harvey JME:[id number] DOB: September 08, 1915 DOA: 04/09/2014 PCP: Noralee Space, MD  Brief narrative: Edward Harvey is a 80 y.o. male with diverticulosis, subtotal colectomy, heart block with pacemaker, Crohn's colitis, chronic diastolic heart failure with intact EF, prostate cancer status post radiation, hypertension, CAD who is a resident of nursing home and was sent to the hospital for nausea and vomiting. In the ER workup was suggestive of small bowel obstruction dehydration and acute renal failure and therefore he was admitted. He is he managed conservatively with NG tube placement. Surgery has been assisting with management. He pulled out his NG tube last night.  Subjective: Patient has no complaints of pain. He states he is hungry. Per RN he is having some loose bowel movements now.  Assessment/Plan: Principal Problem:   SBO (small bowel obstruction) - surgery has ordered abdominal x-rays for today- which are still pending - NG tube has been pulled out by patient - hopefully will be able to start clears today- awaiting surgery recommendations- has adequate bowel sounds on exam and is having bowel movements.  Active Problems:    Hypernatremia-   Dehydration - has been on D5 since yesterday however IV was out for most of the night and he was not able to be given the fluids. -We will continue D5 and follow sodium levels    Hypokalemia - -Continue to replace with potassium in the IV  - may be related to poor by mouth intake and the loose stools he is having now    Senile dementia - appears to be stable    Atrial fibrillation - V paced- not on anticoagulation or on rate controlling medicines as outpatient      Crohn's colitis - no acute issues - on mesalamine at home-resume when able    Severe protein-calorie malnutrition - Will need dietary supplements once able to take orals    ARF (acute renal failure) -likely prerenal and has been  steadily improving and near his baseline now- baseline creatinine is 0.9-1.0    Anemia - 11 has been stable    PPM-St.Jude- history of AV block  Gout - Resume allopurinol when able to take orals appropriately- hopefully tomorrow  BPH - Resume Flomax when able  Code Status: Full code Family Communication:  Disposition Plan:return to skilled nursing facility DVT prophylaxis: heparin  Consultants: surgery  Procedures: none  Antibiotics: Anti-infectives    None      Objective: Filed Weights   04/10/14 0438 04/11/14 0619 04/12/14 0443  Weight: 85.2 kg (187 lb 13.3 oz) 83.5 kg (184 lb 1.4 oz) 83.2 kg (183 lb 6.8 oz)    Intake/Output Summary (Last 24 hours) at 04/12/14 1033 Last data filed at 04/12/14 2297  Gross per 24 hour  Intake 821.67 ml  Output   1258 ml  Net -436.33 ml     Vitals Filed Vitals:   04/11/14 0619 04/11/14 1608 04/11/14 2046 04/12/14 0443  BP: 106/49 124/58 134/67 114/62  Pulse: 78 70 70 72  Temp: 97.7 F (36.5 C) 97.4 F (36.3 C) 97.5 F (36.4 C) 99 F (37.2 C)  TempSrc: Oral Oral Oral Oral  Resp: 17 18 18 17   Height:      Weight: 83.5 kg (184 lb 1.4 oz)   83.2 kg (183 lb 6.8 oz)  SpO2: 93% 100% 96% 98%    Exam: General: - alert-does not appear to have any acute distress Lungs: Clear to auscultation bilaterally without wheezes or crackles  Cardiovascular: Regular rate and rhythm without murmur gallop or rub normal S1 and S2 Abdomen: Nontender, nondistended, soft, bowel sounds positive, no rebound, no ascites, no appreciable mass Extremities: No significant cyanosis, clubbing, or edema bilateral lower extremities  Data Reviewed: Basic Metabolic Panel:  Recent Labs Lab 04/09/14 0147 04/10/14 0438 04/11/14 0354 04/12/14 0650  NA 141 144 154* 152*  K 4.9 4.0 3.5* 3.6*  CL 92* 103 116* 114*  CO2 29 28 28 29   GLUCOSE 148* 95 77 86  BUN 61* 67* 49* 30*  CREATININE 2.54* 2.15* 1.51* 1.15  CALCIUM 10.0 8.2* 7.9* 8.3*   Liver  Function Tests:  Recent Labs Lab 04/09/14 0147  AST 24  ALT 21  ALKPHOS 78  BILITOT 0.6  PROT 8.5*  ALBUMIN 3.1*    Recent Labs Lab 04/09/14 0147  LIPASE 21   No results for input(s): AMMONIA in the last 168 hours. CBC:  Recent Labs Lab 04/09/14 0147 04/10/14 0438 04/11/14 0354 04/12/14 0650  WBC 10.2 5.6 6.0 6.4  NEUTROABS 8.5*  --   --   --   HGB 12.6* 10.4* 9.7* 10.2*  HCT 36.5* 31.8* 30.0* 31.3*  MCV 90.3 93.0 92.9 93.7  PLT 182 147* 160 167   Cardiac Enzymes:  Recent Labs Lab 04/09/14 0147  TROPONINI <0.30   BNP (last 3 results)  Recent Labs  05/26/13 2127 08/11/13 1915  PROBNP 278.5 188.8   CBG: No results for input(s): GLUCAP in the last 168 hours.  Recent Results (from the past 240 hour(s))  MRSA PCR Screening     Status: None   Collection Time: 04/09/14  2:33 PM  Result Value Ref Range Status   MRSA by PCR NEGATIVE NEGATIVE Final    Comment:        The GeneXpert MRSA Assay (FDA approved for NASAL specimens only), is one component of a comprehensive MRSA colonization surveillance program. It is not intended to diagnose MRSA infection nor to guide or monitor treatment for MRSA infections.      Studies:  Recent x-ray studies have been reviewed in detail by the Attending Physician  Scheduled Meds:  Scheduled Meds: . brimonidine  1 drop Both Eyes BID  . heparin  5,000 Units Subcutaneous 3 times per day  . lidocaine  1 patch Transdermal Q24H  . pantoprazole (PROTONIX) IV  40 mg Intravenous Q24H   Continuous Infusions: . dextrose 5 % with KCl 20 mEq / L 20 mEq (04/12/14 8242)    Time spent on care of this patient:30 min   [place], MD 04/12/2014, 10:33 AM  LOS: 3 days   [facility] Office  [phone] Pager - Text Page per [email or web address]  If 7PM-7AM, please contact night-coverage [email or web address]

## 2014-04-12 NOTE — Plan of Care (Signed)
Problem: Phase II Progression Outcomes Goal: Progress activity as tolerated unless otherwise ordered Outcome: Progressing     

## 2014-04-12 NOTE — Progress Notes (Signed)
Called and spoke with Melissa in radiology who instructed nurse that they will come pick up pt tonight for abd. X-r that was ordered today.  Oncoming nurse made aware.  Amparo Donalson, Therapist, sports.

## 2014-04-13 ENCOUNTER — Encounter: Payer: Self-pay | Admitting: *Deleted

## 2014-04-13 DIAGNOSIS — N170 Acute kidney failure with tubular necrosis: Secondary | ICD-10-CM

## 2014-04-13 DIAGNOSIS — K5010 Crohn's disease of large intestine without complications: Secondary | ICD-10-CM

## 2014-04-13 LAB — CLOSTRIDIUM DIFFICILE BY PCR: Toxigenic C. Difficile by PCR: POSITIVE — AB

## 2014-04-13 MED ORDER — METRONIDAZOLE IN NACL 5-0.79 MG/ML-% IV SOLN
500.0000 mg | Freq: Three times a day (TID) | INTRAVENOUS | Status: DC
  Administered 2014-04-13 – 2014-04-14 (×4): 500 mg via INTRAVENOUS
  Filled 2014-04-13 (×6): qty 100

## 2014-04-13 MED ORDER — BOOST / RESOURCE BREEZE PO LIQD
1.0000 | Freq: Three times a day (TID) | ORAL | Status: DC
  Administered 2014-04-13 – 2014-04-14 (×2): 1 via ORAL

## 2014-04-13 NOTE — Progress Notes (Signed)
CSW spoke with Narda Rutherford- Admissions at [facility] and was notified that patient is a current resident of the facility. During visit with patient- he had indicated that he was residing at home with his daughter Juliann Pulse and did not appear disoriented to the [employer] intern or this CSW.  [facility] will accept patient back when medically stable.  Per MD- possible d/c tomorrow. Notified patient's daughter Tye [place] of above and she wants patient to return to facility.  Will follow up with MD and daughter tomorrow once d/c is confirmed. Lorie Phenix. Pauline Good, [place]

## 2014-04-13 NOTE — Plan of Care (Signed)
Problem: Phase I Progression Outcomes Goal: Pain controlled with appropriate interventions Outcome: Completed/Met Date Met:  04/13/14     

## 2014-04-13 NOTE — Progress Notes (Signed)
Patient ID: Edward Harvey, male   DOB: 10-01-1915, 81 y.o.   MRN: [id number]    Subjective: Pt feels ok today and wants something to drink.  Having multiple BMs  Objective: Vital signs in last 24 hours: Temp:  [97.7 F (36.5 C)-98.4 F (36.9 C)] 97.7 F (36.5 C) (11/11 0525) Pulse Rate:  [68-71] 71 (11/11 0525) Resp:  [18] 18 (11/11 0525) BP: (116-130)/(63-72) 130/72 mmHg (11/11 0525) SpO2:  [95 %-98 %] 95 % (11/11 0525) Weight:  [189 lb 9.5 oz (86 kg)] 189 lb 9.5 oz (86 kg) (11/11 0525) Last BM Date: 30-Apr-2014  Intake/Output from previous day: 05-01-2023 0701 - 11/11 0700 In: 1951.7 [I.V.:1951.7] Out: 300 [Urine:300] Intake/Output this shift:    PE: Abd: soft, NT, ND, +BS  Lab Results:   Recent Labs  04/11/14 0354 April 30, 2014 0650  WBC 6.0 6.4  HGB 9.7* 10.2*  HCT 30.0* 31.3*  PLT 160 167   BMET  Recent Labs  04/11/14 0354 04-30-2014 0650  NA 154* 152*  K 3.5* 3.6*  CL 116* 114*  CO2 28 29  GLUCOSE 77 86  BUN 49* 30*  CREATININE 1.51* 1.15  CALCIUM 7.9* 8.3*   PT/INR No results for input(s): LABPROT, INR in the last 72 hours. CMP     Component Value Date/Time   NA 152* 30-Apr-2014 0650   K 3.6* 2014/04/30 0650   CL 114* 2014/04/30 0650   CO2 29 April 30, 2014 0650   GLUCOSE 86 04-30-14 0650   BUN 30* 04/30/2014 0650   CREATININE 1.15 30-Apr-2014 0650   CALCIUM 8.3* 04/30/2014 0650   PROT 8.5* 04/09/2014 0147   ALBUMIN 3.1* 04/09/2014 0147   AST 24 04/09/2014 0147   ALT 21 04/09/2014 0147   ALKPHOS 78 04/09/2014 0147   BILITOT 0.6 04/09/2014 0147   GFRNONAA 52* 30-Apr-2014 0650   GFRAA 60* 2014-04-30 0650   Lipase     Component Value Date/Time   LIPASE 21 04/09/2014 0147       Studies/Results: Dg Abd 2 Views  2014/04/30   CLINICAL DATA:  Abdominal distention, patient thinks he may be constipated, personal history of hypertension, coronary artery disease, prostate cancer, colitis, colonic diverticulosis, prior obstruction  EXAM: ABDOMEN - 2 VIEW   COMPARISON:  04/11/2014  FINDINGS: Pancreatic calcifications are better visualized on prior CT.  Nonobstructive bowel gas pattern.  No definite bowel dilatation, bowel [place] thickening or free intraperitoneal air.  Bones demineralized with multilevel degenerative disc disease changes of the lumbar spine.  No definite urinary tract calcification.  Surgical clips RIGHT upper quadrant question cholecystectomy.  IMPRESSION: Nonobstructive bowel gas pattern.   Electronically Signed   By: Lavonia Dana M.D.   On: 04-30-2014 20:40   Dg Abd 2 Views  04/11/2014   CLINICAL DATA:  Small bowel obstruction, weakness.  EXAM: ABDOMEN - 2 VIEW  COMPARISON:  CT abdomen pelvis and abdominal series 11715.  FINDINGS: Nasogastric tube is looped in the stomach. There is gaseous distension of predominantly small bowel, improved somewhat from 04/09/2014. Probable stool in the rectosigmoid colon. No free air. Pancreatic calcifications are noted.  IMPRESSION: 1. Mild improvement in small bowel distension, without complete resolution of small bowel obstruction. 2. Chronic calcific pancreatitis, better seen on 04/09/2014.   Electronically Signed   By: Lorin Picket M.D.   On: 04/11/2014 11:31    Anti-infectives: Anti-infectives    Start     Dose/Rate Route Frequency Ordered Stop   04/13/14 0915  metroNIDAZOLE (FLAGYL) IVPB 500 mg  500 mg100 mL/hr over 60 Minutes Intravenous Every 8 hours 04/13/14 0909         Assessment/Plan  1. SBO, resolved 2. c diff colitis  Plan: 1. c diff treatment per primary service 2. May have clear liquids and advance diet as tolerates it.   LOS: 4 days    Jullianna Gabor E 04/13/2014, 9:13 AM Pager: [phone]

## 2014-04-13 NOTE — Progress Notes (Signed)
[facility] PROGRESS NOTE  Edward Harvey VHQ:[id number] DOB: 1915/11/17 DOA: 04/09/2014 PCP: Noralee Space, MD  Assessment/Plan: 1. SBO 1. No obstruction on 11/10 abd xray 2. On clears with plans to advance diet as tolerated 3. Surgery following 2. Hypernatremia 1. Stable 2. Improving gradually 3. Dementia 1. Appears stable 2. Pt seems to have insight to his condition this AM 4. Chronic Afib 1. Rate controlled 2. Not on rate controlling meds 5. Crohn's colitis 1. On mesalamine at home, plan to resume when stable 6. Severe protein calorie malnutrition 1. Clears today and advance diet as tolerated 7. ARF 1. Improving 8. Chronic anemia 1. Hgb stable 9. Hx St Jude PPM with hx AV block 1. Stable 10. Gout 1. Stable 2. No evidence of flare 11. BPH 1. On PO allopurinol - would resume when able to tolerated PO 12. DVT prophylaxis 1. Heparin subQ  Code Status: Full Family Communication: Pt in room (indicate person spoken with, relationship, and if by phone, the number) Disposition Plan: Pending   Consultants:    Procedures:    Antibiotics:    HPI/Subjective: No acute events noted overnight. Pt eager to eat  Objective: Filed Vitals:   04/12/14 0443 04/12/14 1456 04/12/14 2040 04/13/14 0525  BP: 114/62 116/63 121/68 130/72  Pulse: 72 68 70 71  Temp: 99 F (37.2 C) 98.4 F (36.9 C) 98.4 F (36.9 C) 97.7 F (36.5 C)  TempSrc: Oral Oral Oral Oral  Resp: 17 18 18 18   Height:      Weight: 83.2 kg (183 lb 6.8 oz)   86 kg (189 lb 9.5 oz)  SpO2: 98% 95% 98% 95%    Intake/Output Summary (Last 24 hours) at 04/13/14 1305 Last data filed at 04/13/14 1147  Gross per 24 hour  Intake 2911.67 ml  Output    300 ml  Net 2611.67 ml   Filed Weights   04/11/14 0619 04/12/14 0443 04/13/14 0525  Weight: 83.5 kg (184 lb 1.4 oz) 83.2 kg (183 lb 6.8 oz) 86 kg (189 lb 9.5 oz)    Exam:   General:  Awake, in nad  Cardiovascular: regular, s1, s2  Respiratory:  normal resp effort, no wheezing  Abdomen: soft, nondistended, pos BS  Musculoskeletal: perfused, no clubbing   Data Reviewed: Basic Metabolic Panel:  Recent Labs Lab 04/09/14 0147 04/10/14 0438 04/11/14 0354 04/12/14 0650  NA 141 144 154* 152*  K 4.9 4.0 3.5* 3.6*  CL 92* 103 116* 114*  CO2 29 28 28 29   GLUCOSE 148* 95 77 86  BUN 61* 67* 49* 30*  CREATININE 2.54* 2.15* 1.51* 1.15  CALCIUM 10.0 8.2* 7.9* 8.3*   Liver Function Tests:  Recent Labs Lab 04/09/14 0147  AST 24  ALT 21  ALKPHOS 78  BILITOT 0.6  PROT 8.5*  ALBUMIN 3.1*    Recent Labs Lab 04/09/14 0147  LIPASE 21   No results for input(s): AMMONIA in the last 168 hours. CBC:  Recent Labs Lab 04/09/14 0147 04/10/14 0438 04/11/14 0354 04/12/14 0650  WBC 10.2 5.6 6.0 6.4  NEUTROABS 8.5*  --   --   --   HGB 12.6* 10.4* 9.7* 10.2*  HCT 36.5* 31.8* 30.0* 31.3*  MCV 90.3 93.0 92.9 93.7  PLT 182 147* 160 167   Cardiac Enzymes:  Recent Labs Lab 04/09/14 0147  TROPONINI <0.30   BNP (last 3 results)  Recent Labs  05/26/13 2127 08/11/13 1915  PROBNP 278.5 188.8   CBG: No results for input(s): GLUCAP  in the last 168 hours.  Recent Results (from the past 240 hour(s))  MRSA PCR Screening     Status: None   Collection Time: 04/09/14  2:33 PM  Result Value Ref Range Status   MRSA by PCR NEGATIVE NEGATIVE Final    Comment:        The GeneXpert MRSA Assay (FDA approved for NASAL specimens only), is one component of a comprehensive MRSA colonization surveillance program. It is not intended to diagnose MRSA infection nor to guide or monitor treatment for MRSA infections.   Clostridium Difficile by PCR     Status: Abnormal   Collection Time: 04/13/14  2:53 AM  Result Value Ref Range Status   C difficile by pcr POSITIVE (A) NEGATIVE Final    Comment: CRITICAL RESULT CALLED TO, READ BACK BY AND VERIFIED WITH: LEWIS S RN 04/13/14 0806 COSTELLO B      Studies: Dg Abd 2  Views  04/12/2014   CLINICAL DATA:  Abdominal distention, patient thinks he may be constipated, personal history of hypertension, coronary artery disease, prostate cancer, colitis, colonic diverticulosis, prior obstruction  EXAM: ABDOMEN - 2 VIEW  COMPARISON:  04/11/2014  FINDINGS: Pancreatic calcifications are better visualized on prior CT.  Nonobstructive bowel gas pattern.  No definite bowel dilatation, bowel [place] thickening or free intraperitoneal air.  Bones demineralized with multilevel degenerative disc disease changes of the lumbar spine.  No definite urinary tract calcification.  Surgical clips RIGHT upper quadrant question cholecystectomy.  IMPRESSION: Nonobstructive bowel gas pattern.   Electronically Signed   By: Lavonia Dana M.D.   On: 04/12/2014 20:40    Scheduled Meds: . brimonidine  1 drop Both Eyes BID  . heparin  5,000 Units Subcutaneous 3 times per day  . lidocaine  1 patch Transdermal Q24H  . metronidazole  500 mg Intravenous Q8H  . pantoprazole (PROTONIX) IV  40 mg Intravenous Q24H   Continuous Infusions: . dextrose 5 % with KCl 20 mEq / L 20 mEq (04/13/14 0224)    Principal Problem:   SBO (small bowel obstruction) Active Problems:   Gout   Essential hypertension   Coronary atherosclerosis   AV BLOCK, COMPLETE   PPM-St.Jude   Senile dementia   Atrial fibrillation   Crohn's colitis   Dehydration   Severe protein-calorie malnutrition   BPH (benign prostatic hyperplasia)   ARF (acute renal failure)   Anemia   Hypernatremia   Hypokalemia  Time spent: 70min  Niamya Vittitow, [facility] Pager [phone]. If 7PM-7AM, please contact night-coverage at [email or web address], password [facility] 04/13/2014, 1:05 PM  LOS: 4 days

## 2014-04-13 NOTE — Progress Notes (Signed)
Chaplain followed up on spiritual care consult of pt requesting chaplain. Pt seemed confused. Pt did not need chaplain services at this time. Page chaplain as needed.    04/13/14 1000  Clinical Encounter Type  Visited With Patient  Visit Type Initial;Spiritual support  Referral From Nurse  Rolly Salter, [place] 04/13/2014 10:09 AM

## 2014-04-13 NOTE — Progress Notes (Signed)
C-diff results are positve.  Reported by Oscar La.  MD is aware.

## 2014-04-13 NOTE — Care Management Note (Signed)
    Page 1 of 1   04/13/2014     5:27:33 PM CARE MANAGEMENT NOTE 04/13/2014  Patient:  Edward Harvey, Edward Harvey   Account Number:  [id number]  Date Initiated:  04/13/2014  Documentation initiated by:  Mariann Laster  Subjective/Objective Assessment:   SBO, CDiff     Action/Plan:   CM to follow for disposition needs   Anticipated DC Date:  04/15/2014   Anticipated DC Plan:  SKILLED NURSING FACILITY  In-house referral  Clinical Social Worker      DC Planning Services  CM consult      [facility] Choice  NA   Choice offered to / List presented to:  NA           Status of service:  Completed, signed off Medicare Important Message given?  YES (If response is "NO", the following Medicare IM given date fields will be blank) Date Medicare IM given:  04/13/2014 Medicare IM given by:  Bellarose Burtt Date Additional Medicare IM given:   Additional Medicare IM given by:    Discharge Disposition:  [place]  Per UR Regulation:  Reviewed for med. necessity/level of care/duration of stay  If discussed at [place] of Stay Meetings, dates discussed:    Comments:  Ahad Colarusso RN, BSN, MSHL, CCM  Nurse - Case Manager,  ([address] [phone]  04/13/2014 Dispo Plan:  SNF/[facility] (See SW note for further details)

## 2014-04-14 DIAGNOSIS — I480 Paroxysmal atrial fibrillation: Secondary | ICD-10-CM | POA: Diagnosis not present

## 2014-04-14 DIAGNOSIS — A047 Enterocolitis due to Clostridium difficile: Secondary | ICD-10-CM | POA: Diagnosis not present

## 2014-04-14 DIAGNOSIS — I442 Atrioventricular block, complete: Secondary | ICD-10-CM | POA: Diagnosis not present

## 2014-04-14 DIAGNOSIS — N179 Acute kidney failure, unspecified: Secondary | ICD-10-CM | POA: Diagnosis not present

## 2014-04-14 DIAGNOSIS — Z79899 Other long term (current) drug therapy: Secondary | ICD-10-CM | POA: Diagnosis not present

## 2014-04-14 DIAGNOSIS — I4891 Unspecified atrial fibrillation: Secondary | ICD-10-CM | POA: Diagnosis not present

## 2014-04-14 DIAGNOSIS — E860 Dehydration: Secondary | ICD-10-CM | POA: Diagnosis not present

## 2014-04-14 DIAGNOSIS — Z8546 Personal history of malignant neoplasm of prostate: Secondary | ICD-10-CM | POA: Diagnosis not present

## 2014-04-14 DIAGNOSIS — R600 Localized edema: Secondary | ICD-10-CM | POA: Diagnosis not present

## 2014-04-14 DIAGNOSIS — E559 Vitamin D deficiency, unspecified: Secondary | ICD-10-CM | POA: Diagnosis not present

## 2014-04-14 DIAGNOSIS — I10 Essential (primary) hypertension: Secondary | ICD-10-CM | POA: Diagnosis not present

## 2014-04-14 DIAGNOSIS — M109 Gout, unspecified: Secondary | ICD-10-CM | POA: Diagnosis not present

## 2014-04-14 DIAGNOSIS — I9589 Other hypotension: Secondary | ICD-10-CM | POA: Diagnosis not present

## 2014-04-14 DIAGNOSIS — E785 Hyperlipidemia, unspecified: Secondary | ICD-10-CM | POA: Diagnosis not present

## 2014-04-14 DIAGNOSIS — Z950 Presence of cardiac pacemaker: Secondary | ICD-10-CM

## 2014-04-14 DIAGNOSIS — N400 Enlarged prostate without lower urinary tract symptoms: Secondary | ICD-10-CM | POA: Diagnosis not present

## 2014-04-14 DIAGNOSIS — I2510 Atherosclerotic heart disease of native coronary artery without angina pectoris: Secondary | ICD-10-CM | POA: Diagnosis not present

## 2014-04-14 DIAGNOSIS — R262 Difficulty in walking, not elsewhere classified: Secondary | ICD-10-CM | POA: Diagnosis not present

## 2014-04-14 DIAGNOSIS — E039 Hypothyroidism, unspecified: Secondary | ICD-10-CM | POA: Diagnosis not present

## 2014-04-14 DIAGNOSIS — K50018 Crohn's disease of small intestine with other complication: Secondary | ICD-10-CM | POA: Diagnosis not present

## 2014-04-14 DIAGNOSIS — E870 Hyperosmolality and hypernatremia: Secondary | ICD-10-CM | POA: Diagnosis not present

## 2014-04-14 DIAGNOSIS — M150 Primary generalized (osteo)arthritis: Secondary | ICD-10-CM | POA: Diagnosis not present

## 2014-04-14 DIAGNOSIS — K50118 Crohn's disease of large intestine with other complication: Secondary | ICD-10-CM | POA: Diagnosis not present

## 2014-04-14 DIAGNOSIS — I872 Venous insufficiency (chronic) (peripheral): Secondary | ICD-10-CM | POA: Diagnosis not present

## 2014-04-14 DIAGNOSIS — M1000 Idiopathic gout, unspecified site: Secondary | ICD-10-CM | POA: Diagnosis not present

## 2014-04-14 DIAGNOSIS — F0390 Unspecified dementia without behavioral disturbance: Secondary | ICD-10-CM | POA: Diagnosis not present

## 2014-04-14 DIAGNOSIS — K5669 Other intestinal obstruction: Secondary | ICD-10-CM | POA: Diagnosis not present

## 2014-04-14 DIAGNOSIS — E46 Unspecified protein-calorie malnutrition: Secondary | ICD-10-CM | POA: Diagnosis not present

## 2014-04-14 DIAGNOSIS — N189 Chronic kidney disease, unspecified: Secondary | ICD-10-CM | POA: Diagnosis not present

## 2014-04-14 DIAGNOSIS — M6281 Muscle weakness (generalized): Secondary | ICD-10-CM | POA: Diagnosis not present

## 2014-04-14 DIAGNOSIS — K5090 Crohn's disease, unspecified, without complications: Secondary | ICD-10-CM | POA: Diagnosis not present

## 2014-04-14 DIAGNOSIS — D649 Anemia, unspecified: Secondary | ICD-10-CM | POA: Diagnosis not present

## 2014-04-14 DIAGNOSIS — K5660 Unspecified intestinal obstruction: Secondary | ICD-10-CM | POA: Diagnosis not present

## 2014-04-14 LAB — CBC
HCT: 33.1 % — ABNORMAL LOW (ref 39.0–52.0)
HEMOGLOBIN: 10.7 g/dL — AB (ref 13.0–17.0)
MCH: 31 pg (ref 26.0–34.0)
MCHC: 32.3 g/dL (ref 30.0–36.0)
MCV: 95.9 fL (ref 78.0–100.0)
Platelets: 154 10*3/uL (ref 150–400)
RBC: 3.45 MIL/uL — ABNORMAL LOW (ref 4.22–5.81)
RDW: 16.9 % — ABNORMAL HIGH (ref 11.5–15.5)
WBC: 10.7 10*3/uL — ABNORMAL HIGH (ref 4.0–10.5)

## 2014-04-14 LAB — BASIC METABOLIC PANEL
Anion gap: 9 (ref 5–15)
BUN: 12 mg/dL (ref 6–23)
CO2: 22 meq/L (ref 19–32)
CREATININE: 1 mg/dL (ref 0.50–1.35)
Calcium: 8 mg/dL — ABNORMAL LOW (ref 8.4–10.5)
Chloride: 107 mEq/L (ref 96–112)
GFR calc Af Amer: 71 mL/min — ABNORMAL LOW (ref >90)
GFR calc non Af Amer: 61 mL/min — ABNORMAL LOW (ref >90)
Glucose, Bld: 100 mg/dL — ABNORMAL HIGH (ref 70–99)
Potassium: 3.9 mEq/L (ref 3.7–5.3)
Sodium: 138 mEq/L (ref 137–147)

## 2014-04-14 MED ORDER — METRONIDAZOLE 500 MG PO TABS: 500.0000 mg | ORAL_TABLET | Freq: Three times a day (TID) | ORAL | Status: DC

## 2014-04-14 MED ORDER — FAMOTIDINE 20 MG PO TABS: 20.0000 mg | ORAL_TABLET | Freq: Two times a day (BID) | ORAL | Status: DC

## 2014-04-14 MED ORDER — SACCHAROMYCES BOULARDII 250 MG PO CAPS: 250.0000 mg | ORAL_CAPSULE | Freq: Two times a day (BID) | ORAL | Status: AC

## 2014-04-14 MED ORDER — FAMOTIDINE IN NACL 20-0.9 MG/50ML-% IV SOLN
20.0000 mg | INTRAVENOUS | Status: DC
  Administered 2014-04-14: 20 mg via INTRAVENOUS
  Filled 2014-04-14: qty 50

## 2014-04-14 NOTE — Clinical Social Work Psychosocial (Addendum)
Clinical Social Work Department BRIEF PSYCHOSOCIAL ASSESSMENT 04/12/2014  Patient:  Edward Harvey, Edward Harvey     Account Number:  [id number]     Admit date:  04/09/2014  Clinical Social Worker:  Elam Dutch  Date/Time:  04/12/2014 03:43 PM  Referred by:  Physician  Date Referred:  04/11/2014 Referred for  SNF Placement   Other Referral:   Interview type:  Patient Other interview type:    PSYCHOSOCIAL DATA Living Status:  WITH ADULT CHILDREN Admitted from facility:   Level of care:   Primary support name:  Prithvi Kooi  [phone] Primary support relationship to patient:  CHILD, ADULT Degree of support available:   Strong support per patient  Son- Luie, Brooke Bonito. is also supportive    CURRENT CONCERNS Current Concerns  Post-Acute Placement   Other Concerns:    SOCIAL WORK ASSESSMENT / PLAN CSW met with this 80 year old patient after receiving referral for SNF placement per PT recommendation. CSW discussed rehab placement recommendation with patient and he stated that he was agreeable to this. He stated that he lives with his daughter Tye [place] and is hopeful to return there.  Patient gave permission to contact his daughter for support and collateral needs.  Fl2 completed and bed search initated;  Fl2 place on chart for MD's signature.   Assessment/plan status:  Psychosocial Support/Ongoing Assessment of Needs Other assessment/ plan:   Information/referral to community resources:   SNF be list left with patient in his for review    PATIENT'S/FAMILY'S RESPONSE TO PLAN OF CARE: Patient was sitting up in bed and was very welcoming to [employer], water. Stated that he was feeling better and had no complaints.  CSW relayed that per his permission- would contact his daugther Tye Maryland re: SNF bed offers. He verbalized that he was pleased with this and denied having any concerns or questions.  Patient related that he wanted to get better.  Per nursing report- patient is alert and oriented  per person, place and time.  CSW is attempting to reach daughter Tye Maryland to discuss d/c plan further.    04/13/14  Addendum:  CSW notified by [facility] that patient is a resident of their facility and they will accept him back. Patient was, apparently, more confused at the time of the assessment than was realized.  CSW spoke to his daughter Tye [place] and she wants him to return to [facility] when stable.  Lorie Phenix. [place], [place]

## 2014-04-14 NOTE — Discharge Summary (Signed)
Physician Discharge Summary  Edward Harvey UDJ:[id number] DOB: September 11, 1915 DOA: 04/09/2014  PCP: Noralee Space, MD  Admit date: 04/09/2014 Discharge date: 04/14/2014  Time spent: 35 minutes  Recommendations for Outpatient Follow-up:  1. Follow up with PCP in 1-2 weeks  Discharge Diagnoses:  Principal Problem:   SBO (small bowel obstruction) Active Problems:   Gout   Essential hypertension   Coronary atherosclerosis   AV BLOCK, COMPLETE   PPM-St.Jude   Senile dementia   Atrial fibrillation   Crohn's colitis   Dehydration   Severe protein-calorie malnutrition   BPH (benign prostatic hyperplasia)   ARF (acute renal failure)   Anemia   Hypernatremia   Hypokalemia   Discharge Condition: Improved  Diet recommendation: Regular  Filed Weights   04/12/14 0443 04/13/14 0525 04/14/14 0610  Weight: 83.2 kg (183 lb 6.8 oz) 86 kg (189 lb 9.5 oz) 82.5 kg (181 lb 14.1 oz)    History of present illness:  Please see admit h and p from 11/7 for details. Briefly, pt presents with concerns of SBO with acute renal failure. The patient was admitted for further work up.  Hospital Course:  1. SBO 1. No obstruction on 11/10 abd xray 2. Pt was initially NPO, but started on clears  3. Surgery following 4. Pt noted to have multiple BM and stool studies became pos for cdiff (see below) 5. Surgery has since signed off as of 11/12 2. Hypernatremia 1. Improved nicely with hydration 3. Dementia 1. Appeared stable 2. Pt seems to have insight to his condition during this visit 4. Chronic Afib 1. Rate controlled 2. Not on rate controlling meds 5. Crohn's colitis 1. On mesalamine at home, would resume on discharge 6. Severe protein calorie malnutrition 1. Successfully advanced diet to regular 7. ARF 1. Improving 8. Chronic anemia 1. Hgb stable 9. Hx St Jude PPM with hx AV block 1. Stable 10. Gout 1. Stable 2. No evidence of flare 11. BPH 1. On PO allopurinol - would resume on  d/c 12. DVT prophylaxis 1. Heparin subQ while inpatient 13. Cdiff colitis 1. Pt was found to be pos for cdiff toxin in stool 2. Pt started on flagyl on 11/11 3. Would complete 7-10 day course of flagyl with probiotic 4. Recommend stopping PPI and cont on H2 blocker instead  Consultations:  Surgery  Discharge Exam: Filed Vitals:   04/13/14 1458 04/13/14 2105 04/14/14 0610 04/14/14 0700  BP: 116/63 110/68 116/73 119/68  Pulse: 67 71 64 65  Temp:  98.4 F (36.9 C) 98.2 F (36.8 C) 98.2 F (36.8 C)  TempSrc:  Oral Oral Oral  Resp: 18 18    Height:      Weight:   82.5 kg (181 lb 14.1 oz)   SpO2: 98% 98% 97% 98%    General: awake, in nad Cardiovascular: regular, s1, s2 Respiratory: normal resp effort, no wheezing  Discharge Instructions     Medication List    STOP taking these medications        pantoprazole 40 MG tablet  Commonly known as:  PROTONIX      TAKE these medications        acetaminophen 325 MG tablet  Commonly known as:  TYLENOL  Take 2 tablets (650 mg total) by mouth every 6 (six) hours as needed for mild pain (or Fever >/= 101).     allopurinol 300 MG tablet  Commonly known as:  ZYLOPRIM  Take 300 mg by mouth every morning.  aspirin 81 MG chewable tablet  Chew 81 mg by mouth every evening.     brimonidine 0.1 % Soln  Commonly known as:  ALPHAGAN P  Place 1 drop into both eyes 2 (two) times daily.     CENTRUM SILVER PO  Take 1 tablet by mouth every morning.     diphenoxylate-atropine 2.5-0.025 MG per tablet  Commonly known as:  LOMOTIL  Take 2 tablets by mouth 4 (four) times daily. For 7 days, then 2 tab po qid prn diarrhea. Hold for constipation     famotidine 20 MG tablet  Commonly known as:  PEPCID  Take 1 tablet (20 mg total) by mouth 2 (two) times daily.     feeding supplement (ENSURE COMPLETE) Liqd  Take 237 mLs by mouth 3 (three) times daily between meals.     furosemide 20 MG tablet  Commonly known as:  LASIX  Take 1  tablet (20 mg total) by mouth every other day.     lidocaine 5 %  Commonly known as:  LIDODERM  Place 1 patch onto the skin daily. Remove & Discard patch within 12 hours or as directed by MD     mesalamine 1.2 G EC tablet  Commonly known as:  LIALDA  Take 2.4 g by mouth 2 (two) times daily.     metroNIDAZOLE 500 MG tablet  Commonly known as:  FLAGYL  Take 1 tablet (500 mg total) by mouth 3 (three) times daily.     multivitamin with minerals Tabs tablet  Take 1 tablet by mouth daily.     ondansetron 4 MG tablet  Commonly known as:  ZOFRAN  Take 4 mg by mouth every 6 (six) hours as needed for nausea or vomiting.     saccharomyces boulardii 250 MG capsule  Commonly known as:  FLORASTOR  Take 1 capsule (250 mg total) by mouth 2 (two) times daily.     SANCTURA XR 60 MG Cp24  Generic drug:  Trospium Chloride  Take 60 mg by mouth every evening.     tamsulosin 0.4 MG Caps capsule  Commonly known as:  FLOMAX  Take 0.4 mg by mouth daily after supper.     vitamin C 500 MG tablet  Commonly known as:  ASCORBIC ACID  Take 1 tablet (500 mg total) by mouth daily.     Vitamin D 1000 UNITS capsule  Take 1,000 Units by mouth every morning.       No Known Allergies Follow-up Information    Follow up with NADEL,SCOTT M, MD. Schedule an appointment as soon as possible for a visit in 1 week.   Specialty:  Pulmonary Disease   Contact information:   [place] [address] [phone]        The results of significant diagnostics from this hospitalization (including imaging, microbiology, ancillary and laboratory) are listed below for reference.    Significant Diagnostic Studies: Ct Abdomen Pelvis Wo Contrast  04/09/2014   CLINICAL DATA:  Abdominal pain, nausea, and vomiting.  EXAM: CT ABDOMEN AND PELVIS WITHOUT CONTRAST  TECHNIQUE: Multidetector CT imaging of the abdomen and pelvis was performed following the standard protocol without IV contrast.  COMPARISON:   09/25/2009  FINDINGS: Atelectasis or consolidation in both lung bases with bronchiectasis. Cardiac enlargement.  Go distention of fluid filled stomach and small bowel. Distal decompression of small bowel. Stool-filled colon. Changes consistent with high-grade small bowel obstruction. Cause is not identified. No free fluid or free air in the abdomen.  The unenhanced appearance of the liver, abdominal aorta, inferior vena cava, and retroperitoneal lymph nodes is unremarkable. Surgical absence of the gallbladder. Small gas collections in the gallbladder fossa probably represent pneumobilia. This is likely postoperative although fistula is not excluded. Diffuse pancreatic ductal dilatation with pancreatic calcifications suggesting chronic pancreatitis. Peripherally calcified mass in the region of the head of the pancreas measures 1.9 cm diameter. This could represent an aneurysm or pancreatic mass. Diffuse pancreatic ductal dilatation is present. Low-attenuation lesion in the superior aspect of the spleen measuring 3.9 cm diameter, probably representing a cyst. Probable cysts in both kidneys without hydronephrosis.  Pelvis: Bladder Rikard is not thickened. No free or loculated pelvic fluid collections. Small bilateral inguinal hernias containing fat. Right inguinal hernia also contains a portion of the bladder. No pelvic mass or lymphadenopathy. Appendix is not demonstrated. Markedly diffuse degenerative changes from throughout the lumbar spine. Diffuse bone demineralization. Sclerotic changes in the vertebral bodies likely is degenerative. No destructive bone lesions.  IMPRESSION: High-grade small bowel obstruction with dilatation of fluid-filled small bowel and stomach. Distal decompression. Stool-filled colon. No free air. Multiple additional findings demonstrated and discussed in the body of the report.   Electronically Signed   By: Lucienne Capers M.D.   On: 04/09/2014 05:26   Dg Abd 2 Views  04/12/2014    CLINICAL DATA:  Abdominal distention, patient thinks he may be constipated, personal history of hypertension, coronary artery disease, prostate cancer, colitis, colonic diverticulosis, prior obstruction  EXAM: ABDOMEN - 2 VIEW  COMPARISON:  04/11/2014  FINDINGS: Pancreatic calcifications are better visualized on prior CT.  Nonobstructive bowel gas pattern.  No definite bowel dilatation, bowel [place] thickening or free intraperitoneal air.  Bones demineralized with multilevel degenerative disc disease changes of the lumbar spine.  No definite urinary tract calcification.  Surgical clips RIGHT upper quadrant question cholecystectomy.  IMPRESSION: Nonobstructive bowel gas pattern.   Electronically Signed   By: Lavonia Dana M.D.   On: 04/12/2014 20:40   Dg Abd 2 Views  04/11/2014   CLINICAL DATA:  Small bowel obstruction, weakness.  EXAM: ABDOMEN - 2 VIEW  COMPARISON:  CT abdomen pelvis and abdominal series 11715.  FINDINGS: Nasogastric tube is looped in the stomach. There is gaseous distension of predominantly small bowel, improved somewhat from 04/09/2014. Probable stool in the rectosigmoid colon. No free air. Pancreatic calcifications are noted.  IMPRESSION: 1. Mild improvement in small bowel distension, without complete resolution of small bowel obstruction. 2. Chronic calcific pancreatitis, better seen on 04/09/2014.   Electronically Signed   By: Lorin Picket M.D.   On: 04/11/2014 11:31   Dg Abd Portable 1v  04/09/2014   CLINICAL DATA:  Abdominal pain. Possible free air on previous supine images.  EXAM: PORTABLE ABDOMEN - 1 VIEW  COMPARISON:  Supine abdomen 04/09/2014.  FINDINGS: Technically limited study due to patient's motion. Gas distended colon likely representing ileus. Gas in the right upper quadrant consistent with colonic interposition. No definite evidence of free air.  IMPRESSION: Gas distended colon likely representing ileus. Gas in the right upper quadrant probably representing colonic  interposition. No definite evidence of free air.   Electronically Signed   By: Lucienne Capers M.D.   On: 04/09/2014 02:33   Dg Abd Portable 1v  04/09/2014   CLINICAL DATA:  Initial evaluation for abdominal pain with distension, nausea, vomiting.  EXAM: PORTABLE ABDOMEN - 1 VIEW  COMPARISON:  Prior CT from 09/25/2009.  FINDINGS: Multiple dilated loops of gas-filled small bowel are  seen scattered throughout the abdomen. These measure up to 5.8 cm in diameter. The gas-filled colon in the right upper quadrant is mildly prominent is well. There is a paucity of gas distally. Findings are suggestive of possible ileus, although a distal obstructive process could also have this appearance. Gas lucency at the right hemidiaphragm is favored to reflect colonic interposition with superimposed right basilar atelectasis. No definite free air identified, although this could be confirmed within upright projection.  No soft tissue mass identified. Scattered surgical clips present within the abdomen.  Extensive multilevel degenerative disease present within the lumbar spine with irregular osteophytic spurring. Irregular calcifications adjacent to the L2 vertebral body may be vascular in nature.  IMPRESSION: 1. Multiple prominent gas-filled loops of small and large bowel within the abdomen. Findings are favored to reflect ileus, although possible distal obstructive process could also be considered in the correct clinical setting. 2. Gas lucency overlying the right hemidiaphragm, favored to reflect colonic interposition with superimposed right basilar atelectasis on these supine films. Further evaluation with upright film to ensure no free intraperitoneal air delete present is recommended. Alternatively, further evaluation with cross-sectional imaging could be performed for further evaluation. Results were called by telephone at the time of interpretation on 04/09/2014 at 1:55 am to Dr. Ripley Fraise , who verbally acknowledged  these results.   Electronically Signed   By: Jeannine Boga M.D.   On: 04/09/2014 01:58    Microbiology: Recent Results (from the past 240 hour(s))  MRSA PCR Screening     Status: None   Collection Time: 04/09/14  2:33 PM  Result Value Ref Range Status   MRSA by PCR NEGATIVE NEGATIVE Final    Comment:        The GeneXpert MRSA Assay (FDA approved for NASAL specimens only), is one component of a comprehensive MRSA colonization surveillance program. It is not intended to diagnose MRSA infection nor to guide or monitor treatment for MRSA infections.   Clostridium Difficile by PCR     Status: Abnormal   Collection Time: 04/13/14  2:53 AM  Result Value Ref Range Status   C difficile by pcr POSITIVE (A) NEGATIVE Final    Comment: CRITICAL RESULT CALLED TO, READ BACK BY AND VERIFIED WITH: LEWIS S RN 04/13/14 0806 COSTELLO B      Labs: Basic Metabolic Panel:  Recent Labs Lab 04/09/14 0147 04/10/14 0438 04/11/14 0354 04/12/14 0650 04/14/14 0919  NA 141 144 154* 152* 138  K 4.9 4.0 3.5* 3.6* 3.9  CL 92* 103 116* 114* 107  CO2 29 28 28 29 22   GLUCOSE 148* 95 77 86 100*  BUN 61* 67* 49* 30* 12  CREATININE 2.54* 2.15* 1.51* 1.15 1.00  CALCIUM 10.0 8.2* 7.9* 8.3* 8.0*   Liver Function Tests:  Recent Labs Lab 04/09/14 0147  AST 24  ALT 21  ALKPHOS 78  BILITOT 0.6  PROT 8.5*  ALBUMIN 3.1*    Recent Labs Lab 04/09/14 0147  LIPASE 21   No results for input(s): AMMONIA in the last 168 hours. CBC:  Recent Labs Lab 04/09/14 0147 04/10/14 0438 04/11/14 0354 04/12/14 0650 04/14/14 0919  WBC 10.2 5.6 6.0 6.4 10.7*  NEUTROABS 8.5*  --   --   --   --   HGB 12.6* 10.4* 9.7* 10.2* 10.7*  HCT 36.5* 31.8* 30.0* 31.3* 33.1*  MCV 90.3 93.0 92.9 93.7 95.9  PLT 182 147* 160 167 154   Cardiac Enzymes:  Recent Labs Lab 04/09/14 0147  TROPONINI <  0.30   BNP: BNP (last 3 results)  Recent Labs  05/26/13 2127 08/11/13 1915  PROBNP 278.5 188.8   CBG: No  results for input(s): GLUCAP in the last 168 hours.   Signed:  Ryian Lynde K  [facility] 04/14/2014, 1:09 PM

## 2014-04-14 NOTE — Progress Notes (Signed)
  Subjective: No c/o. No n/v/abd pain. Multiple BMs. Wants a straw  Objective: Vital signs in last 24 hours: Temp:  [98.2 F (36.8 C)-98.4 F (36.9 C)] 98.2 F (36.8 C) (11/12 0610) Pulse Rate:  [64-71] 64 (11/12 0610) Resp:  [18] 18 (11/11 2105) BP: (110-116)/(63-73) 116/73 mmHg (11/12 0610) SpO2:  [97 %-98 %] 97 % (11/12 0610) Weight:  [181 lb 14.1 oz (82.5 kg)] 181 lb 14.1 oz (82.5 kg) (11/12 0610) Last BM Date: 04/13/14  Intake/Output from previous day: 11/11 0701 - 11/12 0700 In: 3055 [P.O.:1320; I.V.:1535; IV Piggyback:200] Out: 525 [Urine:525] Intake/Output this shift:    Asleep easily awakens.  Nontoxic Soft, nt, nd  Lab Results:   Recent Labs  2014-05-04 0650  WBC 6.4  HGB 10.2*  HCT 31.3*  PLT 167   BMET  Recent Labs  2014-05-04 0650  NA 152*  K 3.6*  CL 114*  CO2 29  GLUCOSE 86  BUN 30*  CREATININE 1.15  CALCIUM 8.3*   PT/INR No results for input(s): LABPROT, INR in the last 72 hours. ABG No results for input(s): PHART, HCO3 in the last 72 hours.  Invalid input(s): PCO2, PO2  Studies/Results: Dg Abd 2 Views  2014-05-04   CLINICAL DATA:  Abdominal distention, patient thinks he may be constipated, personal history of hypertension, coronary artery disease, prostate cancer, colitis, colonic diverticulosis, prior obstruction  EXAM: ABDOMEN - 2 VIEW  COMPARISON:  04/11/2014  FINDINGS: Pancreatic calcifications are better visualized on prior CT.  Nonobstructive bowel gas pattern.  No definite bowel dilatation, bowel [place] thickening or free intraperitoneal air.  Bones demineralized with multilevel degenerative disc disease changes of the lumbar spine.  No definite urinary tract calcification.  Surgical clips RIGHT upper quadrant question cholecystectomy.  IMPRESSION: Nonobstructive bowel gas pattern.   Electronically Signed   By: Lavonia Dana M.D.   On: 2014/05/04 20:40    Anti-infectives: Anti-infectives    Start     Dose/Rate Route Frequency Ordered  Stop   04/13/14 1000  metroNIDAZOLE (FLAGYL) IVPB 500 mg     500 mg100 mL/hr over 60 Minutes Intravenous Every 8 hours 04/13/14 0909        Assessment/Plan: pSBO - resolved; adv diet as tolerated C diff - txt per primary team PT/OT/oob, ambulate Signing off. Please call with questions.   Leighton Ruff. Redmond Pulling, MD, FACS General, Bariatric, & Minimally Invasive Surgery [facility], [place]   LOS: 5 days    Gayland Curry 04/14/2014

## 2014-04-14 NOTE — Progress Notes (Signed)
Ok per MD for d/c today back to [facility] for continued SNF care.  Ok per patient and message left for patient's son Wayden, [place] and spoke to daughter Tye [place] re: d/c. She was very pleased that d/c is today.  Packet completed and DC summary sent to facility. EMS arranged for transport and notified patient's nurse Sabrina to call report.  No further CSW needs identified and signing off.  Lorie Phenix. Pauline Good, [place]

## 2014-04-18 DIAGNOSIS — K5669 Other intestinal obstruction: Secondary | ICD-10-CM | POA: Diagnosis not present

## 2014-04-18 DIAGNOSIS — K50018 Crohn's disease of small intestine with other complication: Secondary | ICD-10-CM | POA: Diagnosis not present

## 2014-04-18 DIAGNOSIS — I480 Paroxysmal atrial fibrillation: Secondary | ICD-10-CM | POA: Diagnosis not present

## 2014-04-18 DIAGNOSIS — I10 Essential (primary) hypertension: Secondary | ICD-10-CM | POA: Diagnosis not present

## 2014-04-18 DIAGNOSIS — E860 Dehydration: Secondary | ICD-10-CM | POA: Diagnosis not present

## 2014-04-18 DIAGNOSIS — M1000 Idiopathic gout, unspecified site: Secondary | ICD-10-CM | POA: Diagnosis not present

## 2014-04-25 ENCOUNTER — Inpatient Hospital Stay: Payer: Medicare Other | Admitting: Pulmonary Disease

## 2014-05-03 ENCOUNTER — Encounter: Payer: Self-pay | Admitting: Pulmonary Disease

## 2014-05-03 ENCOUNTER — Ambulatory Visit (INDEPENDENT_AMBULATORY_CARE_PROVIDER_SITE_OTHER): Payer: Medicare Other | Admitting: Pulmonary Disease

## 2014-05-03 VITALS — BP 90/60 | HR 50 | Temp 98.5°F | Ht 72.0 in | Wt 183.0 lb

## 2014-05-03 DIAGNOSIS — F0390 Unspecified dementia without behavioral disturbance: Secondary | ICD-10-CM

## 2014-05-03 DIAGNOSIS — I9589 Other hypotension: Secondary | ICD-10-CM | POA: Diagnosis not present

## 2014-05-03 DIAGNOSIS — I2510 Atherosclerotic heart disease of native coronary artery without angina pectoris: Secondary | ICD-10-CM

## 2014-05-03 DIAGNOSIS — Z8546 Personal history of malignant neoplasm of prostate: Secondary | ICD-10-CM

## 2014-05-03 DIAGNOSIS — Z950 Presence of cardiac pacemaker: Secondary | ICD-10-CM | POA: Diagnosis not present

## 2014-05-03 DIAGNOSIS — I442 Atrioventricular block, complete: Secondary | ICD-10-CM

## 2014-05-03 DIAGNOSIS — I872 Venous insufficiency (chronic) (peripheral): Secondary | ICD-10-CM | POA: Diagnosis not present

## 2014-05-03 DIAGNOSIS — K50118 Crohn's disease of large intestine with other complication: Secondary | ICD-10-CM | POA: Diagnosis not present

## 2014-05-03 DIAGNOSIS — N400 Enlarged prostate without lower urinary tract symptoms: Secondary | ICD-10-CM

## 2014-05-03 DIAGNOSIS — K5669 Other intestinal obstruction: Secondary | ICD-10-CM

## 2014-05-03 DIAGNOSIS — N289 Disorder of kidney and ureter, unspecified: Secondary | ICD-10-CM

## 2014-05-03 DIAGNOSIS — R269 Unspecified abnormalities of gait and mobility: Secondary | ICD-10-CM

## 2014-05-03 DIAGNOSIS — M159 Polyosteoarthritis, unspecified: Secondary | ICD-10-CM

## 2014-05-03 DIAGNOSIS — K56609 Unspecified intestinal obstruction, unspecified as to partial versus complete obstruction: Secondary | ICD-10-CM

## 2014-05-03 DIAGNOSIS — M150 Primary generalized (osteo)arthritis: Secondary | ICD-10-CM

## 2014-05-03 NOTE — Patient Instructions (Signed)
Today we updated your med list in our EPIC system...     We decided to decrease the Lasix to 20mg  every other day as needed...    Check BP daily in AM...    Continue the Lasix20 every other day as ordered...    But HOLD Lasix if VV<748 systolic...   Continue NH follow up by [facility] & physician staff at [facility].Marland KitchenMarland Kitchen

## 2014-05-03 NOTE — Progress Notes (Signed)
Subjective:    Patient ID: Edward Harvey, male    DOB: 1915/11/07, 80 y.o.   MRN: [id number]  HPI 80 y/o BM here for a follow up visit... he has multiple medical problems including HBP, CAD, & complete heart block w/ pacer followed by DrKlein;  hx of GI bleed from divertics w/ subtotal colectomy in 2003 & followed by DrPatterson;  hx SBO, inguinal hernias, gallstones;  hx prostate cancer followed by DrOttelin;  DJD & Gout;  hx subdural hematoma prev requiring burr hole, & progressive senile dementia... ~  SEE PREV EPIC NOTES FOR OLDER DATA >>   ~  September 08, 2012:  95mo ROV & post hosp check> He was [facility] 4/6-7/14 after he got too hot in church he says; DCSummary indicates syncope assoc w/ BUN~25, Cr~1.4, Hg~10, CXR- NAD; given IVF & improved, no postural BP changes; he knows to avoid sodium & is taking Lasix20/d... Feels he is back to baseline- wants to restart his PT w/ [facility]- ok... We reviewed the following medical problems during today's office visit >>     AB> no recent URI or breathing prob reported...    HBP> on ASA81, Coreg6.25Bid, Quinapril20, Lasix20; wt is down 1# on diet to 224#; BP= 140/78 & he feels he is stable...    CAD/ AVblock/ Pacer> on meds above; he denies CP, palpit, ch in dyspnea or edema...    Divertics w/ hx hemorrhage/ Hx SBO> he denies abd pain, N/V/C; recent diarrhea improved w/ Lialda1.2Bid    Hx Prostate Cancer> on Flomax0.4; followed by DrOttelin but we don't have recent notes...     DJD/ Gout> on Allopurinol300, OTC analgesics prn & MVI/ Vit D supplement;     Hx subdural hematoma> required burr holes back then; no known sequellae & he hasn't been falling... We reviewed prob list, meds, xrays and labs> see below for updates >>   ~  December 08, 2012:  71mo ROV & [name] has lost 5# down to 219# & he has less edema despite cutting his Lasix to 20mg /d last OV; he notes incr urinary freq during the day (on Flomax0.4) but denies nocturia! We discussed low sodium & incr  exercise...  He saw DrWall 7/14 for f/u CAD, heart block, pacer- doing satis & pacer check is ok; he will f/u w/ DrKlein in 61mo; BP= 116/72 on his Coreg6.25Bid, Accupril20, & Lasix20;  His son says that he was c/o some diarrhea but pt denies to me & remains on Lialda 1.2gmBid...     We reviewed prob list, meds, xrays and labs> see below for updates >>   LABS 7/14:  Chems- ok w/ Cr=1.3;  CBC- okw/ Hg=11.7  ~  March 24, 2013:  3-59mo ROV & [facility] is abd discomfort that comes and goes- burning, diarrhea, etc; he is followed by [facility] w/ divertics, hx Crohn's colitis w/ colonic bleed requiring subtotal colectomy from the ileum to the sigmoid in 2003, SBO 2011, Bilat inguinal hernias and gallstones; he takes Lialda1.2gmBid, AnusolHC cream, and Lomotil vs Miralax prn (he has GI f/u planned soon)...  He is still getting PT at home one day per week... Breathing has been OK, he denies CP & cardiac stable... Followed by DrOttelin for Urology    We reviewed prob list, meds, xrays and labs> see below for updates >> he had the 2014 Flu vaccine in Oct...   ~  July 23, 2013:  37mo ROV & post hosp visit> he was [facility] 12/24 - 05/29/13 by [facility]  after being found unresponsive in his chair, family called 911 & he came around but was hypotensive & brought to ER, he had been having diarrhea & was dehydrated (Cr=1.7=>1.0), no infection found, BP meds were adjusted down; he has Hx Crohn's dis & controlled on Lialda1.2gmBid and Lomotil added for prn use; he has pacer & this checked out OK... We reviewed the following medical problems during today's office visit >>     AB> no recent URI or breathing prob reported; he has not required breathing meds...    HBP> on ASA81, Coreg3.125- taking ?1or2 Bid, Quinapril10, Lasix20; wt is down 3# on diet to 208#; BP= 124/70 & he feels he is stable...    CAD/ AVblock/ Pacer> on meds above; he denies CP, palpit, ch in dyspnea, but he has some pedal edema after the hydration...     Ven Insuffi, Edema>  Reminded to elim salt, elevate legs, wear support hose, & take the Lasix20 daily...    Divertics w/ hx hemorrhage/ Hx SBO/ segmental colitis w/ diarrhea> he denies abd pain, N/V/C; recent diarrhea improved w/ Lialda1.2Bid & lomotil prn; he saw DrPatterson 2/15 & stable...    Hx Prostate Cancer> on Flomax0.4 & Sanctura 60 added yest; followed by DrOttelin but we don't have recent notes...     DJD/ Gout> on Allopurinol300, OTC analgesics prn & MVI/ Vit D supplement;     Hx subdural hematoma, senile dementia> required burr holes back then; no known sequellae & he hasn't been falling...    Derm> Podiatry trims his mycotically infected toenails We reviewed the following medical problems during today's office visit >> THEY DID NOT BRING MED BOTTLES TO THE OV TODAY...  CXR 12/14 showed cardiomeg/ vasc congestion/ interstitial edema, basilar atx, right sided pacer, R>L glenohumeral arthritis...  EKG 12/14 showed pacer rhythm, rate72, no change...  CT Head 12/14 showed NAD, atrophy, bilat burr holes, and right max sinus dis...   LABS 12/14 reviewed> Chems- ok w/ Cr improved from 1.7 => 1.0 w/ hydration;  CBC- ok but Hg~11;  TSH=0.63...  ~  November 04, 2013:  3-38mo ROV & post hosp check>  [name] was [place] x2 3/15 and 4/15 by [facility] w/ dehydration & renal insuffic; he was disch to [facility] w/ med adjustments & he remains there (still getting therapy & needs help w/ ADLs) but they are thinking of taking him home> we discussed the need for 24/7 coverage, can't be alone, & needs to be able to get up & about w/ walker etc; they request Aid & Attendance from the [facility] for assistance (form completed per their request &  scanned into epic...     AB> no recent URI or breathing prob reported; he has not required breathing meds...    HBP> on ASA81, Lasix20Qod & off Coreg & Quinapril; wt is down 10# on diet to 198#; BP= 102/68 & he feels weak=> rec nutritional supplements...     CAD/ AVblock/ Pacer>  on meds above; he denies CP, palpit, ch in dyspnea, but he has some pedal edema after the hydration...    Ven Insuffi, Edema>  Reminded to elim salt, elevate legs, wear support hose, & take the Lasix20 qod...    Divertics w/ hx hemorrhage/ Hx SBO/ segmental colitis w/ diarrhea> on Protonix40; he denies abd pain, N/V/C; recent diarrhea improved w/ Lialda1.2Bid & lomotil prn; he saw DrPatterson 2/15 & stable...    Hx Prostate Cancer> on Flomax0.4 & Sanctura60; followed by DrOttelin but we don't have recent notes.Marland KitchenMarland Kitchen  DJD/ Gout> on Allopurinol300, OTC analgesics prn & MVI/ Vit D supplement;     Hx subdural hematoma, senile dementia> required burr holes back then; no known sequellae & he hasn't been falling...    Derm> Podiatry trims his mycotically infected toenails We reviewed prob list, meds, xrays and labs> see below for updates >>   LABS 6/15:  Chems- wnl x Alb=2.6 & Ca=8.7;  CBC= anemic w/ Hg=10.6, MCV=105, Fe=70 (30%sat), B12=680;  Uric=4.4 on Allopurinol300...  REC:  MVI, nutrtional supplements Bid...   ~  May 03, 2014:  77mo ROV & post hosp visit> Thor has remained in [facility] & attended by DrSouth> he was sent to the ER 11/7 w/ abd discomfort & nausea, diagnosed w/ SBO & Hosp 11/7 - 04/14/14 by [facility]; managed conservatively & slowly improved but stool studies were pos for CDiff requiring Flagyl Rx & placed on Probiotic, his PPI was switched to H2blocker as well... Here for f/u visit and it is apparent that Myrle has "fallen off a lot" weight down 15#, weaker, in wheelchair, etc; pt's son indicates that he is participating in PT at the [facility] & appears to be improving;  He is currently being treated w/ Cipro for a UTI;  We reviewed the following medical problems during today's office visit >>     AB> no recent URI or breathing prob reported; he has not required breathing meds...    HBP> on ASA81, Lasix20Qod; wt is down 15# further to 183#; BP= 90/60 today & he feels weak=> rec HOLD  Lasix for BP<100sys & nutritional supplements daily...     CAD/ AVblock/ Pacer> followed by DrKlein; he denies CP, palpit, ch in dyspnea, but he has some mild pedal edema after the hydration...    Ven Insuffi, Edema>  Reminded to elim salt, elevate legs, wear support hose, & take the Lasix20 qod if BP allows...    Divertics w/ hx hemorrhage/ Hx SBO/ segmental colitis w/ diarrhea> off Protonix on Pepcid20Bid & Florastor; denies abd pain, N/V/C; recent diarrhea improved w/ Flagyl & incr Lialda1.2-2Bid w/ lomotil prn; he saw DrPatterson last  2/15...     Hx Prostate Cancer, renal insuffic> on Flomax0.4 & Sanctura60; he wears depends from the NH, followed by DrOttelin but we don't have recent notes; Cr was up to 2.5 on Adm 11/15 but improved to 1.0 by disch after hydration...     DJD/ Gout> on Allopurinol300, OTC analgesics prn & MVI/ Vit D supplement;     Hx subdural hematoma, senile dementia> required burr holes for SDH yrs ago; known senile dementia w/ atrophy & chr small vessel dis; they are not inclined to consider Aricept etc...    Derm> Podiatry trims his mycotically infected toenails periodically... We reviewed prob list, meds, xrays and labs> see below for updates >> he received the 2015 Flu vaccine and a Pneumovax-23 in [id number]...   EKG 11/15 showed pacer rhythm...   Abd XRay 11/15 showed nonobstructive bowel gas pattern, surg clips in RUQ, DDD & osteopenia...  CT Abd&Pelvis 11/15 showed cardiomeg, atx & bronchiectasis at bases, SBO & stool filled colon, no free air, s/pGB, pancreatic calcif c/w chr pancreatitis & 2cm mass in head of pancreas ?etiology, cysts in spleen & both kidneys...  LABS 11/15:  Chems- ok x Cr=2.5 on Adm & improved to 1.0 at disch;  CBC- mild anemia w/ Hg=10.7;  Stool was pos for CDiff... PLAN>> due to hypotesion & weakness we will HOLD the Lasix for BP<100sys, encourage PO intake 7 nutrition,  otherw continue same meds & f/u by DrSouth at [facility]...          Problem List:    GLAUCOMA (ICD-365.9) - prev followed by [facility] w/ bilat cataract surg & glaucoma on eye 3 diff drops as noted...  HEARING LOSS - son says he has 3 sets of hearing aides but won't wear any of them...  Hx of ASTHMATIC BRONCHITIS, ACUTE (ICD-466.0) - he has never smoked... hx recurrent bronchitic infections over the yrs w/ reactive airway component... no recent prob, and no regular meds required.  ~  CXR 5/13 showed stable hrt size & pacer, clear lungs, degen changes in Tspine, NAD.Marland Kitchen. ~  CXR 4/14 showed heart at upper lim normal, totuous Ao, pacer, clear lungs/ stable scarring at left base/ NAD.Marland Kitchen. ~  CXR 12/14 showed cardiomeg/ vasc congestion/ interstitial edema, basilar atx, right sided pacer, R>L glenohumeral arthritis ~  CXR Mar & Apr 2015 in hosp showed stable cardiomeg, pacer, tortuous ao, scarring left base, colon above liver on the right, NAD...  HYPERTENSION (ICD-401.9) >>  ~  on ASA 81mg /d, COREG 6.25Bid, QUINAPRIL 20mg Bid, LASIX 20mg /d... He takes his own meds & states no problems. ~  4/12:  BP= 110/74 & feeling well... he denies HA, visual changes, CP, palipit, dizziness, syncope, change in dyspnea, etc... ~  8/12:  BP= 110/68 & continues stable... ~  12/12:  BP= 116/78 & he remains asymptomatic... ~  5/13:  BP= 126/80 & he continues to deny CP, palpit, SOB, edema, etc... ~  9/13:  BP= 126/78 & he denies CP, palpit, ch in SOB or edema... ~  10/13:  BP= 102/66 & he remains largely asymptomatic... ~  1/14:  BP= 118/70 & he continues to deny CP, palpit, ch in SOB/ edema... ~  4/14: on ASA81, Coreg6.25Bid, Quinapril20, Lasix20; wt is down 1# on diet to 224#; BP= 140/78 & he feels he is stable. ~  7/14: on ASA81, Coreg6.25Bid, Quinapril20, Lasix20; weight is down 5# to 219# & BP= 116/72 ~  2/15: on ASA81, Coreg3.125- 1or2Bid, Quinapril10, Lasix20; weight is down further to 208# today; BP= 124/70... ~  6/15: on ASA81, Lasix20Qod & off Coreg & Quinapril; wt is down 10# on  diet to 198#; BP= 102/68 & he feels weak=> rec nutritional supplements. ~  11/15: on ASA81, Lasix20Qod; wt is down 15# further to 183#; BP= 90/60 today & he feels weak=> rec HOLD Lasix for BP<100sys, & nutritional supplements daily  CORONARY ARTERY DISEASE (ICD-414.00) - followed by DrWall for Cardiology on above meds... pt has not had a prev cath... ~  2DEcho 4/05 showed mild conc LVH and norm LVF- improved from 2002 when EF= 45%... ~  NuclearStressTest 10/06 showed prior inferoseptal & apical infarct w/ apical AK, w/o ischemia, EF= 48%... no change from prev. ~  EKG 7/14 showed dual paced rhythm... ~  EKG 12/14: pacer rhythm, rate72, no change... ~  2DEcho 3/15 showed mild LVH, mild focal basilar hypertrophy, norm LVF w/ EF=50-55%, norm Cruces motion, mild MR, mild RA dil...   Hx of AV BLOCK, COMPLETE (ICD-426.0), & CARDIAC PACEMAKER IN SITU (ICD-V45.01) - he had a pacemaker placed for complete heart block initially in 1994, & this was exchanged for a new dual chamber pacer: Joylene Draft DDDR model [phone] by [facility] 6/06... ~  seen by DrKlein 1/10 w/ pacer check OK, no further episodes of AFib on his monitor... not a Coumadin candidate due to age, unsteady, hx of GI bleeds, and prev subdural hematoma. ~  followed  regularly by DrKlein's pacer clinic (last note 10/12 reviewed)> stable, doing well, no changes made. ~  3/15: he had f/u DrKlein> pacer for CHB, gets regular pacer checks, doing satis & no changes made...   DIVERTICULOSIS OF COLON WITH HEMORRHAGE (ICD-562.12)  GASTROINTESTINAL HEMORRHAGE (ICD-578.9) - he had a subtotal colectomy w/ ileum to distal sigmoid anastomosis in Jul03 by DrNewman... Hx SEGMENTAL COLITIS >> see below  ~  EGD 8/03 by Demetra Shiner was normal... ~  colonoscopy 1/08 by DrPatterson showed inflammed mucosa in sm bowel prox to the ileo-colonic anastomosis, few divertics in remaining distal colon, no polyps etc...  ~  f/u colonoscopy 5/09 showed prev colectomy- anastomosis  granular & bleeding ?Crohn's... also had divertics & hems... Rx'd w/ Lialda.  CROHN'S COLITIS >>  Hx of SMALL BOWEL OBSTRUCTION (ICD-560.9) > see [id number] Hospitalization by [facility]... ~  Abd Sonar 4/11 showed mult gallstones in GB, echogenic renal parenchyma c/w medical renal dis... ~  CT Abd 4/11 showed mild atx at bases, sl GB Julson thickening & ductal dilatation (known gallstones), mult cysts in spleen & kidneys, ectatic ao, bilat fat filled inguinal hernias, degen changes in spine... ~  12/13: he had f/u DrPatterson & note reviewed> hx segmental colitis assoc w/ severe diverticulosis & prev left colon resection; restarted LIALDA 1.2gm Bid for diarrhea & improved... ~  8/14: he had f/u DrPatterson w/ hx segmental colitis and divertics, occas loose stools, on Lialda2.4gm/d and rec to take benefiber sprinkled on food daily; ok to use immodium or lomotil as needed...  ~  2/15: he had f/u DrPatterson> hx diverticulosis & a segmental colitis treated w/ Lialda1.2gmBid & prn Lomotil; diarrhea has resolved, stool was neg for blood, hx subtotal colectomy because of recurrent diverticulitis- last sigmoid exam in 2008 showed severe segmental colitis versus Crohn's disease at his anastomotic areas ( path showed severe ileitis and colitis most consistent with inflammatory bowel disease)...  ~  11/15: [facility] for [facility]BO, treated conservatively & improved; stool prior to disch was pos for CDiff- treated w/ Flagyl & improved; he remains on Lialda1.2-now taking 2Bid, Florastor, etc...   INGUINAL HERNIA (ICD-550.90) - he has bilat fat containing inguinal hernias seen on CT Abd in 2008...   GALLSTONES (ICD-574.20) - Sonar 4/11 showed mult gallstones filling the gallbladder, and CT Abd 4/11 showed mild extrahep biliary ductal dilatation... he was evaluated by DrPatterson/ GI, DrNewman/ Surg, DrWall/ Cards> all agreed that risk was hi & to hold off on surg unless absolutely necessary... ~  4/12 & 8/12:  He remains  asymptomatic w/o abd pain, N/V, etc... ~  2013 - 2015:  He remained stable & no surg planned...  RENAL INSUFFICIENCY >> Creatinines as below CARCINOMA, PROSTATE, HX OF (ICD-V10.46) - diagnosed in 1993 & treated w/ XRT... PSA initially ~29 and dropped to ~1 after XRT & it has remained there ever since... followed by DrOttelin- also has BPH, min obstructive symptoms, bilat hydroceles & some benign renal cysts... ~  labs 3/07 by Urology showed PSA= 1.19 ~  labs here 1/10 showed PSA= 1.37 ~  5/11: he reports f/u DrOttelin w/ incontinence symptoms Rx'd Sanctura XR 60mg /d & improved, so he stopped this on his own & states that he is doing satis now... ~  He continues to f/u w/ DrOttelin regularly- we do not have recent notes from Urology... ~  9/13:  He notes slower urinary stream & we decided to try [facility] 0.4mg Qhs... ~  Labs 1/14 showed BUN= 23, creat= 1.4 ~  Labs 7/14 showed BUN= 21, Creat= 1.3 ~  Labs 12/14 Hosp showed Cr=1.7 => 1.0 by discharge after hydration... ~  2/15: he saw DrOttelin for Urology f/u> on Tamsulosin0.4 & they restarted Sanctura60 to help w/ urgency & incont ~  11/15: Hosp for SBO w/ Cr=2.5 on adm => improved to 1.0 by disch...   DEGENERATIVE JOINT DISEASE (ICD-715.90) >> Hx of GOUT (ICD-274.9) >> he has hx of DJD and Gout treated w/ TRAMADOL Prn & ALLOPURINOL 300mg /d; also takes MVI & VIT D 1000 u daily... ~  Labs 5/13 showed Uric= 5.8 ~  XRays show arthritis in shoulders L>R...  SENILE DEMENTIA >> he is 80 y/o HEMATOMA, SUBDURAL (ICD-432.1) - he had a burr hole placed for subdural hematoma in the past...  ~  CT Head 12/14 showed NAD, atrophy, bilat burr holes, and right max sinus dis... ~  CT Head 3/15 showed advanced atrophy, chr microvasc ischemic changes, bilat burr holes, NAD...    Past Surgical History  Procedure Laterality Date  . Pacemaker placement    . Cataract extraction, bilateral    . Subtotal colectomy  12/03    Dr. Lucia Gaskins  . Lipoma excision  2001     left leg  . Burr hole for subdural hematoma      "he's had 4 ORs"/notes 10/29/2001 (05/28/2013)  . Insert / replace / remove pacemaker  1994; 11/2004    Archie Endo 10/29/2001; generator change/notes 11/23/2004  (05/28/2013)  . Pericardiocentesis  1994    post pacemaker placement/notes 03/12/2005 (05/28/2013)  . Tonsillectomy and adenoidectomy  age 53    03/12/2005 (05/28/2013)  . Colonoscopy  2009    Diverticulosis and Hemorrhoids     Outpatient Encounter Prescriptions as of 05/03/2014  Medication Sig  . acetaminophen (TYLENOL) 325 MG tablet Take 2 tablets (650 mg total) by mouth every 6 (six) hours as needed for mild pain (or Fever >/= 101).  Marland Kitchen allopurinol (ZYLOPRIM) 300 MG tablet Take 300 mg by mouth every morning.   Marland Kitchen aspirin 81 MG chewable tablet Chew 81 mg by mouth every evening.   . brimonidine (ALPHAGAN P) 0.1 % SOLN Place 1 drop into both eyes 2 (two) times daily.   . Cholecalciferol (VITAMIN D) 1000 UNITS capsule Take 1,000 Units by mouth every morning.   . diphenoxylate-atropine (LOMOTIL) 2.5-0.025 MG per tablet Take 2 tablets by mouth 4 (four) times daily. For 7 days, then 2 tab po qid prn diarrhea. Hold for constipation  . famotidine (PEPCID) 20 MG tablet Take 1 tablet (20 mg total) by mouth 2 (two) times daily.  . furosemide (LASIX) 20 MG tablet Take 1 tablet (20 mg total) by mouth every other day.  . lidocaine (LIDODERM) 5 % Place 1 patch onto the skin daily. Remove & Discard patch within 12 hours or as directed by MD  . mesalamine (LIALDA) 1.2 G EC tablet Take 2.4 g by mouth 2 (two) times daily.  . Multiple Vitamin (MULTIVITAMIN WITH MINERALS) TABS tablet Take 1 tablet by mouth daily.  . Multiple Vitamins-Minerals (CENTRUM SILVER PO) Take 1 tablet by mouth every morning.   . ondansetron (ZOFRAN) 4 MG tablet Take 4 mg by mouth every 6 (six) hours as needed for nausea or vomiting.  . saccharomyces boulardii (FLORASTOR) 250 MG capsule Take 1 capsule (250 mg total) by mouth 2 (two)  times daily.  . tamsulosin (FLOMAX) 0.4 MG CAPS capsule Take 0.4 mg by mouth daily after supper.  . Trospium Chloride (SANCTURA XR) 60 MG CP24 Take  60 mg by mouth every evening.   . vitamin C (ASCORBIC ACID) 500 MG tablet Take 1 tablet (500 mg total) by mouth daily.  . [DISCONTINUED] feeding supplement, ENSURE COMPLETE, (ENSURE COMPLETE) LIQD Take 237 mLs by mouth 3 (three) times daily between meals. (Patient not taking: Reported on 05/03/2014)  . [DISCONTINUED] metroNIDAZOLE (FLAGYL) 500 MG tablet Take 1 tablet (500 mg total) by mouth 3 (three) times daily. (Patient not taking: Reported on 05/03/2014)    No Known Allergies   Current Medications, Allergies, Past Medical History, Past Surgical History, Family History, and Social History were reviewed in [employer] record.    Review of Systems        See HPI - all other systems neg except as noted... The patient complains of dyspnea on exertion, mild peripheral edema, muscle weakness, difficulty walking, & wt loss.  The patient denies anorexia, fever, weight gain, hoarseness, chest pain, syncope, prolonged cough, headaches, hemoptysis, abdominal pain, melena, hematochezia, severe indigestion/heartburn, hematuria, incontinence, suspicious skin lesions, transient blindness, depression, unusual weight change, abnormal bleeding, enlarged lymph nodes, and angioedema.     Objective:   Physical Exam     WD, overweight, 80 y/o BM, chr ill appearing but NAD... GENERAL:  Alert, pleasant & cooperative;  VS- reviewed... HEENT:  [place]/AT, EOM- sl strabismus, PERRLA, EACs-clear (HOH), TMs-wnl, NOSE-clear discharge , THROAT-clear & wnl. NECK:  Supple w/ decrROM; no JVD; normal carotid impulses w/o bruits; no thyromegaly or nodules palpated; no lymphadenopathy. CHEST:  Clear to P & A; without wheezes/ rales/ or rhonchi heard;  Pacer palp in right chest... HEART:  Regular Rhythm; without murmurs/ rubs/ or gallops detected... ABDOMEN:   Scar of prev surg, soft & nontender; normal bowel sounds; no organomegaly or masses palpated...  EXT:  Mod-severe arthritic changes; no varicose veins/ +venous insuffic/ tr edema. NEURO:  CN's intact; motor testing normal; sensory testing inconsistant; gait is abnormal & balance poor (uses walker)... DERM:  No lesions noted; no rash etc...  RADIOLOGY DATA:  Reviewed in the EPIC EMR & discussed w/ the patient...  LABORATORY DATA:  Reviewed in the EPIC EMR & discussed w/ the patient...   Assessment & Plan:    SBO>> s/p recent hosp, trweated concervatively & improved; also treated for +CDiff w/ Flagyl & resolved; he remains on [place]...   HBP>  BP is low post hosp, on Lasix20Qod & edema resolved, rec HOLD LASIX for BP<100sys... Followed by DrSouth at [facility]...  CAD, PACER>  He denies angina, palpit, syncope, etc;  Continue cardiology f/u w/ DrKlein.  Divertics, Crohn's, SBO>  He has hx GI hemorrhage in past w/ part colectomy; Hx Crohn's colitis w/ diarrhea- improved on Lialda... Recurrent SBO treated conservatively...  Gallstones>  He remains asymptomatic...  Prostate Ca>  Followed by DrOttelin & stable on Flomax & Sanctura...  DJD>  This is his main issue w/ mobility, self care, remaining independent...  Hx Subdural Hematoma & Senile dementia>  Stable w/o acute problems, he declines neuro eval etc...Marland KitchenMarland KitchenMarland Kitchen   Patient's Medications  New Prescriptions   No medications on file  Previous Medications   ACETAMINOPHEN (TYLENOL) 325 MG TABLET    Take 2 tablets (650 mg total) by mouth every 6 (six) hours as needed for mild pain (or Fever >/= 101).   ALLOPURINOL (ZYLOPRIM) 300 MG TABLET    Take 300 mg by mouth every morning.    ASPIRIN 81 MG CHEWABLE TABLET    Chew 81 mg by mouth every evening.  BRIMONIDINE (ALPHAGAN P) 0.1 % SOLN    Place 1 drop into both eyes 2 (two) times daily.    CHOLECALCIFEROL (VITAMIN D) 1000 UNITS CAPSULE    Take 1,000 Units by mouth every  morning.    DIPHENOXYLATE-ATROPINE (LOMOTIL) 2.5-0.025 MG PER TABLET    Take 2 tablets by mouth 4 (four) times daily. For 7 days, then 2 tab po qid prn diarrhea. Hold for constipation   FAMOTIDINE (PEPCID) 20 MG TABLET    Take 1 tablet (20 mg total) by mouth 2 (two) times daily.   FUROSEMIDE (LASIX) 20 MG TABLET    Take 1 tablet (20 mg total) by mouth every other day.   LIDOCAINE (LIDODERM) 5 %    Place 1 patch onto the skin daily. Remove & Discard patch within 12 hours or as directed by MD   MESALAMINE (LIALDA) 1.2 G EC TABLET    Take 2.4 g by mouth 2 (two) times daily.   MULTIPLE VITAMIN (MULTIVITAMIN WITH MINERALS) TABS TABLET    Take 1 tablet by mouth daily.   MULTIPLE VITAMINS-MINERALS (CENTRUM SILVER PO)    Take 1 tablet by mouth every morning.    ONDANSETRON (ZOFRAN) 4 MG TABLET    Take 4 mg by mouth every 6 (six) hours as needed for nausea or vomiting.   SACCHAROMYCES BOULARDII (FLORASTOR) 250 MG CAPSULE    Take 1 capsule (250 mg total) by mouth 2 (two) times daily.   TAMSULOSIN (FLOMAX) 0.4 MG CAPS CAPSULE    Take 0.4 mg by mouth daily after supper.   TROSPIUM CHLORIDE (SANCTURA XR) 60 MG CP24    Take 60 mg by mouth every evening.    VITAMIN C (ASCORBIC ACID) 500 MG TABLET    Take 1 tablet (500 mg total) by mouth daily.  Modified Medications   No medications on file  Discontinued Medications   FEEDING SUPPLEMENT, ENSURE COMPLETE, (ENSURE COMPLETE) LIQD    Take 237 mLs by mouth 3 (three) times daily between meals.   METRONIDAZOLE (FLAGYL) 500 MG TABLET    Take 1 tablet (500 mg total) by mouth 3 (three) times daily.

## 2014-05-31 ENCOUNTER — Encounter: Payer: Self-pay | Admitting: *Deleted

## 2014-06-09 DIAGNOSIS — E46 Unspecified protein-calorie malnutrition: Secondary | ICD-10-CM | POA: Diagnosis not present

## 2014-06-09 DIAGNOSIS — T24019A Burn of unspecified degree of unspecified thigh, initial encounter: Secondary | ICD-10-CM | POA: Diagnosis not present

## 2014-06-09 DIAGNOSIS — K5090 Crohn's disease, unspecified, without complications: Secondary | ICD-10-CM | POA: Diagnosis not present

## 2014-06-09 DIAGNOSIS — I4891 Unspecified atrial fibrillation: Secondary | ICD-10-CM | POA: Diagnosis not present

## 2014-07-07 ENCOUNTER — Encounter: Payer: Self-pay | Admitting: Internal Medicine

## 2014-07-07 DIAGNOSIS — I2510 Atherosclerotic heart disease of native coronary artery without angina pectoris: Secondary | ICD-10-CM | POA: Diagnosis not present

## 2014-07-07 DIAGNOSIS — I10 Essential (primary) hypertension: Secondary | ICD-10-CM | POA: Diagnosis not present

## 2014-07-07 DIAGNOSIS — K5090 Crohn's disease, unspecified, without complications: Secondary | ICD-10-CM | POA: Diagnosis not present

## 2014-07-07 DIAGNOSIS — I480 Paroxysmal atrial fibrillation: Secondary | ICD-10-CM | POA: Diagnosis not present

## 2014-07-07 DIAGNOSIS — M1000 Idiopathic gout, unspecified site: Secondary | ICD-10-CM | POA: Diagnosis not present

## 2014-07-07 DIAGNOSIS — E43 Unspecified severe protein-calorie malnutrition: Secondary | ICD-10-CM | POA: Diagnosis not present

## 2014-07-07 DIAGNOSIS — F0390 Unspecified dementia without behavioral disturbance: Secondary | ICD-10-CM | POA: Diagnosis not present

## 2014-07-07 DIAGNOSIS — K5669 Other intestinal obstruction: Secondary | ICD-10-CM | POA: Diagnosis not present

## 2014-08-12 ENCOUNTER — Encounter: Payer: Self-pay | Admitting: *Deleted

## 2014-08-12 ENCOUNTER — Ambulatory Visit (INDEPENDENT_AMBULATORY_CARE_PROVIDER_SITE_OTHER): Payer: Medicare Other | Admitting: Internal Medicine

## 2014-08-12 ENCOUNTER — Encounter: Payer: Self-pay | Admitting: Internal Medicine

## 2014-08-12 VITALS — BP 100/50 | HR 72 | Ht 73.5 in | Wt 185.6 lb

## 2014-08-12 DIAGNOSIS — I2510 Atherosclerotic heart disease of native coronary artery without angina pectoris: Secondary | ICD-10-CM | POA: Diagnosis not present

## 2014-08-12 DIAGNOSIS — I442 Atrioventricular block, complete: Secondary | ICD-10-CM

## 2014-08-12 DIAGNOSIS — Z01812 Encounter for preprocedural laboratory examination: Secondary | ICD-10-CM

## 2014-08-12 DIAGNOSIS — Z45018 Encounter for adjustment and management of other part of cardiac pacemaker: Secondary | ICD-10-CM

## 2014-08-12 DIAGNOSIS — I10 Essential (primary) hypertension: Secondary | ICD-10-CM

## 2014-08-12 LAB — MDC_IDC_ENUM_SESS_TYPE_INCLINIC
Brady Statistic RA Percent Paced: 4.6 %
Implantable Pulse Generator Serial Number: 1522496
Lead Channel Impedance Value: 300 Ohm
Lead Channel Impedance Value: 308 Ohm
Lead Channel Pacing Threshold Amplitude: 1 V
Lead Channel Pacing Threshold Pulse Width: 0.8 ms
Lead Channel Setting Pacing Amplitude: 2 V
Lead Channel Setting Pacing Amplitude: 2 V
Lead Channel Setting Sensing Sensitivity: 4 mV
MDC IDC MSMT LEADCHNL RA PACING THRESHOLD AMPLITUDE: 0.75 V
MDC IDC MSMT LEADCHNL RA SENSING INTR AMPL: 1.4 mV
MDC IDC MSMT LEADCHNL RV PACING THRESHOLD PULSEWIDTH: 0.5 ms
MDC IDC SET LEADCHNL RV PACING PULSEWIDTH: 0.5 ms
MDC IDC STAT BRADY RV PERCENT PACED: 98 %

## 2014-08-12 LAB — BASIC METABOLIC PANEL
BUN: 28 mg/dL — ABNORMAL HIGH (ref 6–23)
CALCIUM: 9.3 mg/dL (ref 8.4–10.5)
CO2: 29 mEq/L (ref 19–32)
Chloride: 105 mEq/L (ref 96–112)
Creatinine, Ser: 1.27 mg/dL (ref 0.40–1.50)
GFR: 67.46 mL/min (ref >60.00)
Glucose, Bld: 87 mg/dL (ref 70–99)
POTASSIUM: 4.3 meq/L (ref 3.5–5.1)
SODIUM: 137 meq/L (ref 135–145)

## 2014-08-12 LAB — CBC WITH DIFFERENTIAL/PLATELET
BASOS ABS: 0 10*3/uL (ref 0.0–0.1)
Basophils Relative: 0.4 % (ref 0.0–3.0)
Eosinophils Absolute: 0.2 10*3/uL (ref 0.0–0.7)
Eosinophils Relative: 2.2 % (ref 0.0–5.0)
HCT: 33.8 % — ABNORMAL LOW (ref 39.0–52.0)
HEMOGLOBIN: 11 g/dL — AB (ref 13.0–17.0)
Lymphocytes Relative: 14.6 % (ref 12.0–46.0)
Lymphs Abs: 1.4 10*3/uL (ref 0.7–4.0)
MCHC: 32.5 g/dL (ref 30.0–36.0)
MCV: 96.7 fl (ref 78.0–100.0)
Monocytes Absolute: 0.8 10*3/uL (ref 0.1–1.0)
Monocytes Relative: 8 % (ref 3.0–12.0)
Neutro Abs: 7.3 10*3/uL (ref 1.4–7.7)
Neutrophils Relative %: 74.8 % (ref 43.0–77.0)
PLATELETS: 175 10*3/uL (ref 150.0–400.0)
RBC: 3.5 Mil/uL — ABNORMAL LOW (ref 4.22–5.81)
RDW: 17.7 % — AB (ref 11.5–15.5)
WBC: 9.8 10*3/uL (ref 4.0–10.5)

## 2014-08-12 NOTE — Patient Instructions (Signed)
Your physician recommends that you continue on your current medications as directed. Please refer to the Current Medication list given to you today.  Pre procedure labs today: BMET, CBCD  Your physician has recommended that you have a pacemaker generator change. Please see the instruction sheet given to you today for more information.  Your wound check is scheduled for 08/29/14 at 2:00 p.m.  at [address].   Thank you for choosing [facility]!!

## 2014-08-12 NOTE — Progress Notes (Signed)
Patient Care Team: Noralee Space, MD as PCP - General (Pulmonary Disease)   HPI  Edward Harvey is a 80 y.o. male ollowup for syncope with complete heart block. He  is status post pacemaker implantation. he has no symptoms of chest pain.   He gets around some with the help of his son. Balance is an issue;  shortness of breath and chest pain has not been. He is minimal edema  He is now at [employer]   Past Medical History  Diagnosis Date  . Glaucoma   . Acute bronchitis   . Hypertension   . CAD (coronary artery disease)     nuclear stress test 2006 inferoseptal and apical infarct ejection fraction 48%  . Atrioventricular block, complete   . Diverticulosis of colon with hemorrhage   . Hemorrhage of gastrointestinal tract, unspecified   . Unspecified intestinal obstruction   . Inguinal hernia   . Gallstone   . DJD (degenerative joint disease)   . Gout   . Hematoma     subdural  . Internal hemorrhoids without mention of complication   . Pacemaker   . Prostate cancer     radiation therapy in 1996/notes 10/29/2001 (05/28/2013)  . Colitis     Past Surgical History  Procedure Laterality Date  . Pacemaker placement    . Cataract extraction, bilateral    . Subtotal colectomy  12/03    Dr. Lucia Gaskins  . Lipoma excision  2001    left leg  . Burr hole for subdural hematoma      "he's had 4 ORs"/notes 10/29/2001 (05/28/2013)  . Insert / replace / remove pacemaker  1994; 11/2004    Archie Endo 10/29/2001; generator change/notes 11/23/2004  (05/28/2013)  . Pericardiocentesis  1994    post pacemaker placement/notes 03/12/2005 (05/28/2013)  . Tonsillectomy and adenoidectomy  age 89    03/12/2005 (05/28/2013)  . Colonoscopy  2009    Diverticulosis and Hemorrhoids     Current Outpatient Prescriptions  Medication Sig Dispense Refill  . acetaminophen (TYLENOL) 325 MG tablet Take 2 tablets (650 mg total) by mouth every 6 (six) hours as needed for mild pain (or Fever >/= 101).    Marland Kitchen  allopurinol (ZYLOPRIM) 300 MG tablet Take 300 mg by mouth every morning.     Marland Kitchen aspirin 81 MG chewable tablet Chew 81 mg by mouth every evening.     . brimonidine (ALPHAGAN P) 0.1 % SOLN Place 1 drop into both eyes 2 (two) times daily.     . Cholecalciferol (VITAMIN D) 1000 UNITS capsule Take 1,000 Units by mouth every morning.     . diphenoxylate-atropine (LOMOTIL) 2.5-0.025 MG per tablet Take 2 tablets by mouth 4 (four) times daily. For 7 days, then 2 tab po qid prn diarrhea. Hold for constipation 30 tablet 0  . famotidine (PEPCID) 20 MG tablet Take 1 tablet (20 mg total) by mouth 2 (two) times daily. 30 tablet 0  . furosemide (LASIX) 20 MG tablet Take 1 tablet (20 mg total) by mouth every other day. (Patient taking differently: Take 20 mg by mouth every other day. Hold lasix for BP >100) 90 tablet 1  . lidocaine (LIDODERM) 5 % Place 1 patch onto the skin daily. Remove & Discard patch within 12 hours or as directed by MD    . mesalamine (LIALDA) 1.2 G EC tablet Take 2.4 g by mouth 2 (two) times daily.    . Multiple Vitamin (MULTIVITAMIN WITH MINERALS) TABS tablet Take  1 tablet by mouth daily.    . Multiple Vitamins-Minerals (CENTRUM SILVER PO) Take 1 tablet by mouth every morning.     . ondansetron (ZOFRAN) 4 MG tablet Take 4 mg by mouth every 6 (six) hours as needed for nausea or vomiting.    . saccharomyces boulardii (FLORASTOR) 250 MG capsule Take 1 capsule (250 mg total) by mouth 2 (two) times daily. 30 capsule 0  . tamsulosin (FLOMAX) 0.4 MG CAPS capsule Take 0.4 mg by mouth daily after supper.    . Trospium Chloride (SANCTURA XR) 60 MG CP24 Take 60 mg by mouth every evening.     . vitamin C (ASCORBIC ACID) 500 MG tablet Take 1 tablet (500 mg total) by mouth daily.     No current facility-administered medications for this visit.    No Known Allergies  Review of Systems negative except from HPI and PMH  Physical Exam BP 100/50 mmHg  Pulse 72  Ht 6' 1.5" (1.867 m)  Wt 185 lb 9.6 oz  (84.188 kg)  BMI 24.15 kg/m2 Well developed and well nourished in no acute distress HENT normal E scleral and icterus clear Neck Supple JVP flat; carotids brisk and full Clear to ausculation  *Regular rate and rhythm, no murmurs gallops or rub Soft with active bowel sounds No clubbing cyanosis none Edema Alert and oriented, grossly normal motor and sensory function Skin Warm and Dry    Assessment and  Plan  Complete heart block  Pacemaker-St. Jude  Coronary artery disease    Device has reached ERI somewhat prematurely. We will plan to undertake generator replacement next week.  We have reviewed the benefits and risks of generator replacement.  These include but are not limited to lead fracture and infection.  The patient understands, agrees and is willing to proceed.

## 2014-08-15 ENCOUNTER — Encounter (HOSPITAL_COMMUNITY): Payer: Self-pay | Admitting: Pharmacy Technician

## 2014-08-15 DIAGNOSIS — Z961 Presence of intraocular lens: Secondary | ICD-10-CM | POA: Diagnosis not present

## 2014-08-15 DIAGNOSIS — H4011X4 Primary open-angle glaucoma, indeterminate stage: Secondary | ICD-10-CM | POA: Diagnosis not present

## 2014-08-16 DIAGNOSIS — M1990 Unspecified osteoarthritis, unspecified site: Secondary | ICD-10-CM | POA: Diagnosis not present

## 2014-08-16 DIAGNOSIS — Z7952 Long term (current) use of systemic steroids: Secondary | ICD-10-CM | POA: Diagnosis not present

## 2014-08-16 DIAGNOSIS — Z45010 Encounter for checking and testing of cardiac pacemaker pulse generator [battery]: Secondary | ICD-10-CM | POA: Diagnosis not present

## 2014-08-16 DIAGNOSIS — I442 Atrioventricular block, complete: Secondary | ICD-10-CM | POA: Diagnosis not present

## 2014-08-16 DIAGNOSIS — I10 Essential (primary) hypertension: Secondary | ICD-10-CM | POA: Diagnosis not present

## 2014-08-16 DIAGNOSIS — H409 Unspecified glaucoma: Secondary | ICD-10-CM | POA: Diagnosis not present

## 2014-08-16 DIAGNOSIS — M109 Gout, unspecified: Secondary | ICD-10-CM | POA: Diagnosis not present

## 2014-08-16 DIAGNOSIS — Z7982 Long term (current) use of aspirin: Secondary | ICD-10-CM | POA: Diagnosis not present

## 2014-08-16 DIAGNOSIS — Z79899 Other long term (current) drug therapy: Secondary | ICD-10-CM | POA: Diagnosis not present

## 2014-08-16 DIAGNOSIS — I2510 Atherosclerotic heart disease of native coronary artery without angina pectoris: Secondary | ICD-10-CM | POA: Diagnosis not present

## 2014-08-16 DIAGNOSIS — Z8546 Personal history of malignant neoplasm of prostate: Secondary | ICD-10-CM | POA: Diagnosis not present

## 2014-08-16 MED ORDER — CEFAZOLIN SODIUM-DEXTROSE 2-3 GM-% IV SOLR: 2.0000 g | INTRAVENOUS | Status: AC

## 2014-08-16 MED ORDER — SODIUM CHLORIDE 0.9 % IV SOLN
INTRAVENOUS | Status: DC
  Administered 2014-08-17: 11:00:00 via INTRAVENOUS

## 2014-08-16 MED ORDER — SODIUM CHLORIDE 0.9 % IR SOLN
80.0000 mg | Status: AC
  Filled 2014-08-16: qty 2

## 2014-08-17 ENCOUNTER — Encounter (HOSPITAL_COMMUNITY): Payer: Self-pay | Admitting: Internal Medicine

## 2014-08-17 ENCOUNTER — Encounter (HOSPITAL_COMMUNITY)
Admission: RE | Disposition: A | Discharge status: Home / Self Care | Payer: Self-pay | Source: Ambulatory Visit | Attending: Internal Medicine

## 2014-08-17 ENCOUNTER — Ambulatory Visit (HOSPITAL_COMMUNITY)
Admission: RE | Admit: 2014-08-17 | Discharge: 2014-08-17 | Disposition: A | Discharge status: Home / Self Care | Payer: Medicare Other | Source: Ambulatory Visit | Attending: Internal Medicine | Admitting: Internal Medicine

## 2014-08-17 DIAGNOSIS — I10 Essential (primary) hypertension: Secondary | ICD-10-CM | POA: Insufficient documentation

## 2014-08-17 DIAGNOSIS — Z7982 Long term (current) use of aspirin: Secondary | ICD-10-CM | POA: Insufficient documentation

## 2014-08-17 DIAGNOSIS — I442 Atrioventricular block, complete: Secondary | ICD-10-CM | POA: Diagnosis not present

## 2014-08-17 DIAGNOSIS — Z950 Presence of cardiac pacemaker: Secondary | ICD-10-CM | POA: Diagnosis present

## 2014-08-17 DIAGNOSIS — Z7952 Long term (current) use of systemic steroids: Secondary | ICD-10-CM | POA: Insufficient documentation

## 2014-08-17 DIAGNOSIS — H409 Unspecified glaucoma: Secondary | ICD-10-CM | POA: Insufficient documentation

## 2014-08-17 DIAGNOSIS — I2510 Atherosclerotic heart disease of native coronary artery without angina pectoris: Secondary | ICD-10-CM | POA: Insufficient documentation

## 2014-08-17 DIAGNOSIS — Z45010 Encounter for checking and testing of cardiac pacemaker pulse generator [battery]: Secondary | ICD-10-CM | POA: Diagnosis not present

## 2014-08-17 DIAGNOSIS — Z01812 Encounter for preprocedural laboratory examination: Secondary | ICD-10-CM

## 2014-08-17 DIAGNOSIS — Z45018 Encounter for adjustment and management of other part of cardiac pacemaker: Secondary | ICD-10-CM

## 2014-08-17 DIAGNOSIS — M1990 Unspecified osteoarthritis, unspecified site: Secondary | ICD-10-CM | POA: Insufficient documentation

## 2014-08-17 DIAGNOSIS — Z8546 Personal history of malignant neoplasm of prostate: Secondary | ICD-10-CM | POA: Insufficient documentation

## 2014-08-17 DIAGNOSIS — M109 Gout, unspecified: Secondary | ICD-10-CM | POA: Insufficient documentation

## 2014-08-17 DIAGNOSIS — Z79899 Other long term (current) drug therapy: Secondary | ICD-10-CM | POA: Insufficient documentation

## 2014-08-17 HISTORY — PX: PACEMAKER GENERATOR CHANGE: SHX5481

## 2014-08-17 LAB — SURGICAL PCR SCREEN
MRSA, PCR: NEGATIVE
Staphylococcus aureus: POSITIVE — AB

## 2014-08-17 SURGERY — PACEMAKER GENERATOR CHANGE
Anesthesia: LOCAL

## 2014-08-17 MED ORDER — CEFAZOLIN SODIUM-DEXTROSE 2-3 GM-% IV SOLR
INTRAVENOUS | Status: AC
  Filled 2014-08-17: qty 50

## 2014-08-17 MED ORDER — ONDANSETRON HCL 4 MG/2ML IJ SOLN: 4.0000 mg | Freq: Four times a day (QID) | INTRAMUSCULAR | Status: DC | PRN

## 2014-08-17 MED ORDER — ACETAMINOPHEN 325 MG PO TABS
325.0000 mg | ORAL_TABLET | ORAL | Status: DC | PRN
  Filled 2014-08-17: qty 2

## 2014-08-17 MED ORDER — LIDOCAINE HCL (PF) 1 % IJ SOLN
INTRAMUSCULAR | Status: AC
  Filled 2014-08-17: qty 60

## 2014-08-17 MED ORDER — SODIUM CHLORIDE 0.9 % IV SOLN: INTRAVENOUS | Status: DC

## 2014-08-17 MED ORDER — CHLORHEXIDINE GLUCONATE 4 % EX LIQD: 60.0000 mL | Freq: Once | CUTANEOUS | Status: DC

## 2014-08-17 MED ORDER — MUPIROCIN 2 % EX OINT
1.0000 "application " | TOPICAL_OINTMENT | Freq: Once | CUTANEOUS | Status: AC
  Administered 2014-08-17: 1 via TOPICAL

## 2014-08-17 MED ORDER — MUPIROCIN 2 % EX OINT
TOPICAL_OINTMENT | CUTANEOUS | Status: AC
  Administered 2014-08-17: 1 via TOPICAL
  Filled 2014-08-17: qty 22

## 2014-08-17 NOTE — CV Procedure (Signed)
Preoperative diagnosis  Complete heart block device at [facility] Postoperative diagnosis same/   Procedure: Generator replacement     Following informed consent the patient was brought to the electrophysiology laboratory in place of the fluoroscopic table in the supine position after routine prep and drape lidocaine was infiltrated in the region of the previous incision and carried down to later the device pocket using sharp dissection and electrocautery. The pocket was opened the device was freed up and was explanted.  Interrogation of the previously implanted ventricular lead Lucky [id number]  demonstrated an R wave of 5.6  millivolts., and impedance of 450 ohms, and a pacing threshold of 0.8 volts at 0.5 msec.    The previously implanted atrial lead OSCOR demonstrated a P-wave amplitude of 2.5 milllivolts and impedance of  290 ohms, and a pacing threshold of 1.5 volts at @ 0.71milliseconds.  The leads were inspected. The leads were then attached to a St Jude  pulse generator, serial number [id number].    The pocket was irrigated with antibiotic containing saline solution hemostasis was assured and the leads and the device were placed in the pocket. The wound was then closed in 3 layers in normal fashion.  Dermabond was applied.  EBL minimal   The patient tolerated the procedure without apparent complication.  Virl Axe

## 2014-08-17 NOTE — H&P (View-Only) (Signed)
Patient Care Team: Noralee Space, MD as PCP - General (Pulmonary Disease)   HPI  Edward Harvey is a 81 y.o. male ollowup for syncope with complete heart block. He  is status post pacemaker implantation. he has no symptoms of chest pain.   He gets around some with the help of his son. Balance is an issue;  shortness of breath and chest pain has not been. He is minimal edema  He is now at [employer]   Past Medical History  Diagnosis Date  . Glaucoma   . Acute bronchitis   . Hypertension   . CAD (coronary artery disease)     nuclear stress test 2006 inferoseptal and apical infarct ejection fraction 48%  . Atrioventricular block, complete   . Diverticulosis of colon with hemorrhage   . Hemorrhage of gastrointestinal tract, unspecified   . Unspecified intestinal obstruction   . Inguinal hernia   . Gallstone   . DJD (degenerative joint disease)   . Gout   . Hematoma     subdural  . Internal hemorrhoids without mention of complication   . Pacemaker   . Prostate cancer     radiation therapy in 1996/notes 10/29/2001 (05/28/2013)  . Colitis     Past Surgical History  Procedure Laterality Date  . Pacemaker placement    . Cataract extraction, bilateral    . Subtotal colectomy  12/03    Dr. Lucia Gaskins  . Lipoma excision  2001    left leg  . Burr hole for subdural hematoma      "he's had 4 ORs"/notes 10/29/2001 (05/28/2013)  . Insert / replace / remove pacemaker  1994; 11/2004    Archie Endo 10/29/2001; generator change/notes 11/23/2004  (05/28/2013)  . Pericardiocentesis  1994    post pacemaker placement/notes 03/12/2005 (05/28/2013)  . Tonsillectomy and adenoidectomy  age 22    03/12/2005 (05/28/2013)  . Colonoscopy  2009    Diverticulosis and Hemorrhoids     Current Outpatient Prescriptions  Medication Sig Dispense Refill  . acetaminophen (TYLENOL) 325 MG tablet Take 2 tablets (650 mg total) by mouth every 6 (six) hours as needed for mild pain (or Fever >/= 101).    Marland Kitchen  allopurinol (ZYLOPRIM) 300 MG tablet Take 300 mg by mouth every morning.     Marland Kitchen aspirin 81 MG chewable tablet Chew 81 mg by mouth every evening.     . brimonidine (ALPHAGAN P) 0.1 % SOLN Place 1 drop into both eyes 2 (two) times daily.     . Cholecalciferol (VITAMIN D) 1000 UNITS capsule Take 1,000 Units by mouth every morning.     . diphenoxylate-atropine (LOMOTIL) 2.5-0.025 MG per tablet Take 2 tablets by mouth 4 (four) times daily. For 7 days, then 2 tab po qid prn diarrhea. Hold for constipation 30 tablet 0  . famotidine (PEPCID) 20 MG tablet Take 1 tablet (20 mg total) by mouth 2 (two) times daily. 30 tablet 0  . furosemide (LASIX) 20 MG tablet Take 1 tablet (20 mg total) by mouth every other day. (Patient taking differently: Take 20 mg by mouth every other day. Hold lasix for BP >100) 90 tablet 1  . lidocaine (LIDODERM) 5 % Place 1 patch onto the skin daily. Remove & Discard patch within 12 hours or as directed by MD    . mesalamine (LIALDA) 1.2 G EC tablet Take 2.4 g by mouth 2 (two) times daily.    . Multiple Vitamin (MULTIVITAMIN WITH MINERALS) TABS tablet Take  1 tablet by mouth daily.    . Multiple Vitamins-Minerals (CENTRUM SILVER PO) Take 1 tablet by mouth every morning.     . ondansetron (ZOFRAN) 4 MG tablet Take 4 mg by mouth every 6 (six) hours as needed for nausea or vomiting.    . saccharomyces boulardii (FLORASTOR) 250 MG capsule Take 1 capsule (250 mg total) by mouth 2 (two) times daily. 30 capsule 0  . tamsulosin (FLOMAX) 0.4 MG CAPS capsule Take 0.4 mg by mouth daily after supper.    . Trospium Chloride (SANCTURA XR) 60 MG CP24 Take 60 mg by mouth every evening.     . vitamin C (ASCORBIC ACID) 500 MG tablet Take 1 tablet (500 mg total) by mouth daily.     No current facility-administered medications for this visit.    No Known Allergies  Review of Systems negative except from HPI and PMH  Physical Exam BP 100/50 mmHg  Pulse 72  Ht 6' 1.5" (1.867 m)  Wt 185 lb 9.6 oz  (84.188 kg)  BMI 24.15 kg/m2 Well developed and well nourished in no acute distress HENT normal E scleral and icterus clear Neck Supple JVP flat; carotids brisk and full Clear to ausculation  *Regular rate and rhythm, no murmurs gallops or rub Soft with active bowel sounds No clubbing cyanosis none Edema Alert and oriented, grossly normal motor and sensory function Skin Warm and Dry    Assessment and  Plan  Complete heart block  Pacemaker-St. Jude  Coronary artery disease    Device has reached ERI somewhat prematurely. We will plan to undertake generator replacement next week.  We have reviewed the benefits and risks of generator replacement.  These include but are not limited to lead fracture and infection.  The patient understands, agrees and is willing to proceed.

## 2014-08-17 NOTE — Interval H&P Note (Signed)
History and Physical Interval Note:  08/17/2014 12:05 PM  Edward Harvey  has presented today for surgery, with the diagnosis of ppm eol  The various methods of treatment have been discussed with the patient and family. After consideration of risks, benefits and other options for treatment, the patient has consented to  Procedure(s): PACEMAKER GENERATOR CHANGE (N/A) as a surgical intervention .  The patient's history has been reviewed, patient examined, no change in status, stable for surgery.  I have reviewed the patient's chart and labs.  Questions were answered to the patient's satisfaction.     Virl Axe   Permanent Pacemaker Indication: Documented non-reversible symptomatic bradycardia due to second degree and/or third degree atrioventricular block.

## 2014-08-17 NOTE — Discharge Instructions (Signed)
Pacemaker Battery Change, Care After Refer to this sheet in the next few weeks. These instructions provide you with information on caring for yourself after your procedure. Your health care provider may also give you more specific instructions. Your treatment has been planned according to current medical practices, but problems sometimes occur. Call your health care provider if you have any problems or questions after your procedure. WHAT TO EXPECT AFTER THE PROCEDURE After your procedure, it is typical to have the following sensations:  Soreness at the pacemaker site. HOME CARE INSTRUCTIONS   Keep the incision clean and dry.  Unless advised otherwise, you may shower beginning 48 hours after your procedure.  For the first week after the replacement, avoid stretching motions that pull at the incision site, and avoid heavy exercise with the arm that is on the same side as the incision.  Take medicines only as directed by your health care provider.  Keep all follow-up visits as directed by your health care provider. SEEK MEDICAL CARE IF:   You have pain at the incision site that is not relieved by over-the-counter or prescription medicine.  There is drainage or pus from the incision site.  There is swelling larger than a lime at the incision site.  You develop red streaking that extends above or below the incision site.  You feel brief, intermittent palpitations, light-headedness, or any symptoms that you feel might be related to your heart. SEEK IMMEDIATE MEDICAL CARE IF:   You experience chest pain that is different than the pain at the pacemaker site.  You experience shortness of breath.  You have palpitations or irregular heartbeat.  You have light-headedness that does not go away quickly.  You faint.  You have pain that gets worse and is not relieved by medicine. Document Released: 03/10/2013 Document Revised: 10/04/2013 Document Reviewed: 03/10/2013 [facility] Patient  Information 2015 [place], [place]. This information is not intended to replace advice given to you by your health care provider. Make sure you discuss any questions you have with your health care provider.  Mupirocin nasal ointment What is this medicine? MUPIROCIN CALCIUM (myoo PEER oh sin KAL see um) is an antibiotic. It is used inside the nose to treat infections that are caused by certain bacteria. This helps prevent the spread of infection to patients and health care workers during outbreaks at institutions. This medicine may be used for other purposes; ask your health care provider or pharmacist if you have questions. COMMON BRAND NAME(S): Bactroban What should I tell my health care provider before I take this medicine? They need to know if you have any of these conditions: -an unusual or allergic reaction to mupirocin, other medicines, foods, dyes, or preservatives -pregnant or trying to get pregnant -breast-feeding How should I use this medicine? PLACE IN EACH NARE TWICE DAILY FOR 5 DAYS This medicine is only for use inside the nose. Follow the directions on the prescription label. Wash your hands before and after use. Squeeze half the contents of a single-use tube into one nostril, then squeeze the other half into the other nostril. Press the sides of your nose together and gently massage after application to spread the ointment throughout the nostrils. Do not use your medicine more often than directed. Finish the full course of medicine prescribed by your doctor or health care professional even if you think your condition is better. Talk to your pediatrician regarding the use of this medicine in children. Special care may be needed. Overdosage: If you think you  have taken too much of this medicine contact a poison control center or emergency room at once. NOTE: This medicine is only for you. Do not share this medicine with others. What if I miss a dose? If you miss a dose, take it as soon as  you can. If it is almost time for your next dose, take only that dose. Do not take double or extra doses. What may interact with this medicine? Interactions are not expected. Do not use any other nose products without telling your doctor or health care professional. This list may not describe all possible interactions. Give your health care provider a list of all the medicines, herbs, non-prescription drugs, or dietary supplements you use. Also tell them if you smoke, drink alcohol, or use illegal drugs. Some items may interact with your medicine. What should I watch for while using this medicine? If your nose is severely irritated, burning or stinging from use of this medicine, stop using it and contact your doctor or health care professional. Do not get this medicine in your eyes. If you do, rinse out with plenty of cool tap water. What side effects may I notice from receiving this medicine? Side effects that you should report to your doctor or health care professional as soon as possible: -severe irritation, burning, stinging, or pain Side effects that usually do not require medical attention (report to your doctor or health care professional if they continue or are bothersome): -altered taste -cough -headache -skin itching -sore throat -stuffy or runny nose This list may not describe all possible side effects. Call your doctor for medical advice about side effects. You may report side effects to FDA at 1-800-FDA-1088. Where should I keep my medicine? Keep out of the reach of children. Store at room temperature between 15 and 30 degrees C (59 and 86 degrees F). Do not refrigerate. One tube of ointment is for single use in both nostrils. Throw away after use. NOTE: This sheet is a summary. It may not cover all possible information. If you have questions about this medicine, talk to your doctor, pharmacist, or health care provider.  2015, Elsevier/[employer] Standard. (2007-12-07 14:36:10)

## 2014-08-22 ENCOUNTER — Encounter: Payer: Self-pay | Admitting: Internal Medicine

## 2014-08-29 ENCOUNTER — Ambulatory Visit (INDEPENDENT_AMBULATORY_CARE_PROVIDER_SITE_OTHER): Payer: Medicare Other | Admitting: *Deleted

## 2014-08-29 DIAGNOSIS — I442 Atrioventricular block, complete: Secondary | ICD-10-CM | POA: Diagnosis not present

## 2014-08-29 DIAGNOSIS — Z950 Presence of cardiac pacemaker: Secondary | ICD-10-CM | POA: Diagnosis not present

## 2014-08-29 LAB — MDC_IDC_ENUM_SESS_TYPE_INCLINIC
Battery Impedance: 1000 Ohm — CL
Battery Voltage: 2.79 V
Implantable Pulse Generator Serial Number: 7621899
Lead Channel Impedance Value: 287 Ohm
Lead Channel Impedance Value: 474 Ohm
Lead Channel Pacing Threshold Amplitude: 1 V
Lead Channel Pacing Threshold Pulse Width: 0.5 ms
Lead Channel Sensing Intrinsic Amplitude: 1.7 mV
Lead Channel Setting Pacing Amplitude: 2 V
MDC IDC MSMT LEADCHNL RA PACING THRESHOLD AMPLITUDE: 1 V
MDC IDC MSMT LEADCHNL RV PACING THRESHOLD PULSEWIDTH: 0.5 ms
MDC IDC SESS DTM: 20160328143327
MDC IDC SET LEADCHNL RV PACING PULSEWIDTH: 0.5 ms
MDC IDC SET LEADCHNL RV SENSING SENSITIVITY: 4 mV
MDC IDC STAT BRADY RA PERCENT PACED: 17 %
MDC IDC STAT BRADY RV PERCENT PACED: 99 %

## 2014-08-29 NOTE — Progress Notes (Signed)
Changeout wound check appointment. No steri-strips, dermabond used. Wound without redness or edema. Incision edges approximated, wound well healed. Normal device function. Thresholds, sensing, and impedances consistent with implant measurements. Device programmed at chronic lead settings. Histogram distribution appropriate for patient and level of activity. 24% mode switch + ASA. Patient educated about wound care, arm mobility, lifting restrictions. ROV w/ Dr. Caryl Comes in 36mo.

## 2014-09-01 DIAGNOSIS — N390 Urinary tract infection, site not specified: Secondary | ICD-10-CM | POA: Diagnosis not present

## 2014-09-01 DIAGNOSIS — R319 Hematuria, unspecified: Secondary | ICD-10-CM | POA: Diagnosis not present

## 2014-09-03 DIAGNOSIS — N390 Urinary tract infection, site not specified: Secondary | ICD-10-CM | POA: Diagnosis not present

## 2014-09-03 DIAGNOSIS — R319 Hematuria, unspecified: Secondary | ICD-10-CM | POA: Diagnosis not present

## 2014-09-08 DIAGNOSIS — Z79899 Other long term (current) drug therapy: Secondary | ICD-10-CM | POA: Diagnosis not present

## 2014-09-08 DIAGNOSIS — I10 Essential (primary) hypertension: Secondary | ICD-10-CM | POA: Diagnosis not present

## 2014-09-08 DIAGNOSIS — D649 Anemia, unspecified: Secondary | ICD-10-CM | POA: Diagnosis not present

## 2014-09-08 DIAGNOSIS — E785 Hyperlipidemia, unspecified: Secondary | ICD-10-CM | POA: Diagnosis not present

## 2014-09-09 ENCOUNTER — Encounter: Payer: Self-pay | Admitting: Internal Medicine

## 2014-09-09 DIAGNOSIS — E46 Unspecified protein-calorie malnutrition: Secondary | ICD-10-CM | POA: Diagnosis not present

## 2014-09-09 DIAGNOSIS — K5660 Unspecified intestinal obstruction: Secondary | ICD-10-CM | POA: Diagnosis not present

## 2014-09-09 DIAGNOSIS — F0390 Unspecified dementia without behavioral disturbance: Secondary | ICD-10-CM | POA: Diagnosis not present

## 2014-09-09 DIAGNOSIS — I10 Essential (primary) hypertension: Secondary | ICD-10-CM | POA: Diagnosis not present

## 2014-09-09 DIAGNOSIS — K5090 Crohn's disease, unspecified, without complications: Secondary | ICD-10-CM | POA: Diagnosis not present

## 2014-09-09 DIAGNOSIS — D649 Anemia, unspecified: Secondary | ICD-10-CM | POA: Diagnosis not present

## 2014-09-09 DIAGNOSIS — M109 Gout, unspecified: Secondary | ICD-10-CM | POA: Diagnosis not present

## 2014-09-09 DIAGNOSIS — I4891 Unspecified atrial fibrillation: Secondary | ICD-10-CM | POA: Diagnosis not present

## 2014-09-15 ENCOUNTER — Ambulatory Visit (INDEPENDENT_AMBULATORY_CARE_PROVIDER_SITE_OTHER): Payer: Medicare Other | Admitting: Podiatry

## 2014-09-15 ENCOUNTER — Encounter: Payer: Self-pay | Admitting: Podiatrist

## 2014-09-15 DIAGNOSIS — B351 Tinea unguium: Secondary | ICD-10-CM

## 2014-09-15 DIAGNOSIS — M79673 Pain in unspecified foot: Secondary | ICD-10-CM

## 2014-09-16 NOTE — Progress Notes (Signed)
HPI:  Patient presents today for follow up of foot and nail care. Denies any new complaints today.  Objective:  Patients chart is reviewed.  Neurovascular status unchanged.  Patients nails are thickened, discolored, distrophic, friable and brittle with yellow-brown discoloration. Patient subjectively relates they are painful with shoes and with ambulation of bilateral feet.  Assessment:  Symptomatic onychomycosis  Plan:  Discussed treatment options and alternatives.  The symptomatic toenails were debrided through manual an mechanical means without complication.  Return appointment recommended at routine intervals of 3 months

## 2014-10-03 DIAGNOSIS — I10 Essential (primary) hypertension: Secondary | ICD-10-CM | POA: Diagnosis not present

## 2014-10-03 DIAGNOSIS — Z79899 Other long term (current) drug therapy: Secondary | ICD-10-CM | POA: Diagnosis not present

## 2014-10-03 DIAGNOSIS — D649 Anemia, unspecified: Secondary | ICD-10-CM | POA: Diagnosis not present

## 2014-10-03 DIAGNOSIS — E785 Hyperlipidemia, unspecified: Secondary | ICD-10-CM | POA: Diagnosis not present

## 2014-10-20 DIAGNOSIS — R319 Hematuria, unspecified: Secondary | ICD-10-CM | POA: Diagnosis not present

## 2014-10-20 DIAGNOSIS — N390 Urinary tract infection, site not specified: Secondary | ICD-10-CM | POA: Diagnosis not present

## 2014-10-22 DIAGNOSIS — M109 Gout, unspecified: Secondary | ICD-10-CM | POA: Diagnosis not present

## 2014-10-22 DIAGNOSIS — M6281 Muscle weakness (generalized): Secondary | ICD-10-CM | POA: Diagnosis not present

## 2014-10-22 DIAGNOSIS — R269 Unspecified abnormalities of gait and mobility: Secondary | ICD-10-CM | POA: Diagnosis not present

## 2014-10-22 DIAGNOSIS — M150 Primary generalized (osteo)arthritis: Secondary | ICD-10-CM | POA: Diagnosis not present

## 2014-10-22 DIAGNOSIS — R278 Other lack of coordination: Secondary | ICD-10-CM | POA: Diagnosis not present

## 2014-10-23 DIAGNOSIS — M150 Primary generalized (osteo)arthritis: Secondary | ICD-10-CM | POA: Diagnosis not present

## 2014-10-23 DIAGNOSIS — R278 Other lack of coordination: Secondary | ICD-10-CM | POA: Diagnosis not present

## 2014-10-23 DIAGNOSIS — M109 Gout, unspecified: Secondary | ICD-10-CM | POA: Diagnosis not present

## 2014-10-23 DIAGNOSIS — M6281 Muscle weakness (generalized): Secondary | ICD-10-CM | POA: Diagnosis not present

## 2014-10-23 DIAGNOSIS — R269 Unspecified abnormalities of gait and mobility: Secondary | ICD-10-CM | POA: Diagnosis not present

## 2014-10-24 DIAGNOSIS — R278 Other lack of coordination: Secondary | ICD-10-CM | POA: Diagnosis not present

## 2014-10-24 DIAGNOSIS — M150 Primary generalized (osteo)arthritis: Secondary | ICD-10-CM | POA: Diagnosis not present

## 2014-10-24 DIAGNOSIS — R269 Unspecified abnormalities of gait and mobility: Secondary | ICD-10-CM | POA: Diagnosis not present

## 2014-10-24 DIAGNOSIS — M109 Gout, unspecified: Secondary | ICD-10-CM | POA: Diagnosis not present

## 2014-10-24 DIAGNOSIS — M6281 Muscle weakness (generalized): Secondary | ICD-10-CM | POA: Diagnosis not present

## 2014-10-25 DIAGNOSIS — R269 Unspecified abnormalities of gait and mobility: Secondary | ICD-10-CM | POA: Diagnosis not present

## 2014-10-25 DIAGNOSIS — M109 Gout, unspecified: Secondary | ICD-10-CM | POA: Diagnosis not present

## 2014-10-25 DIAGNOSIS — M150 Primary generalized (osteo)arthritis: Secondary | ICD-10-CM | POA: Diagnosis not present

## 2014-10-25 DIAGNOSIS — M6281 Muscle weakness (generalized): Secondary | ICD-10-CM | POA: Diagnosis not present

## 2014-10-25 DIAGNOSIS — R278 Other lack of coordination: Secondary | ICD-10-CM | POA: Diagnosis not present

## 2014-10-26 DIAGNOSIS — M150 Primary generalized (osteo)arthritis: Secondary | ICD-10-CM | POA: Diagnosis not present

## 2014-10-26 DIAGNOSIS — M109 Gout, unspecified: Secondary | ICD-10-CM | POA: Diagnosis not present

## 2014-10-26 DIAGNOSIS — M6281 Muscle weakness (generalized): Secondary | ICD-10-CM | POA: Diagnosis not present

## 2014-10-26 DIAGNOSIS — R278 Other lack of coordination: Secondary | ICD-10-CM | POA: Diagnosis not present

## 2014-10-26 DIAGNOSIS — R269 Unspecified abnormalities of gait and mobility: Secondary | ICD-10-CM | POA: Diagnosis not present

## 2014-10-29 DIAGNOSIS — M109 Gout, unspecified: Secondary | ICD-10-CM | POA: Diagnosis not present

## 2014-10-29 DIAGNOSIS — R269 Unspecified abnormalities of gait and mobility: Secondary | ICD-10-CM | POA: Diagnosis not present

## 2014-10-29 DIAGNOSIS — M6281 Muscle weakness (generalized): Secondary | ICD-10-CM | POA: Diagnosis not present

## 2014-10-29 DIAGNOSIS — R278 Other lack of coordination: Secondary | ICD-10-CM | POA: Diagnosis not present

## 2014-10-29 DIAGNOSIS — M150 Primary generalized (osteo)arthritis: Secondary | ICD-10-CM | POA: Diagnosis not present

## 2014-10-31 DIAGNOSIS — M109 Gout, unspecified: Secondary | ICD-10-CM | POA: Diagnosis not present

## 2014-10-31 DIAGNOSIS — M150 Primary generalized (osteo)arthritis: Secondary | ICD-10-CM | POA: Diagnosis not present

## 2014-10-31 DIAGNOSIS — R269 Unspecified abnormalities of gait and mobility: Secondary | ICD-10-CM | POA: Diagnosis not present

## 2014-10-31 DIAGNOSIS — R278 Other lack of coordination: Secondary | ICD-10-CM | POA: Diagnosis not present

## 2014-10-31 DIAGNOSIS — M6281 Muscle weakness (generalized): Secondary | ICD-10-CM | POA: Diagnosis not present

## 2014-11-01 DIAGNOSIS — M6281 Muscle weakness (generalized): Secondary | ICD-10-CM | POA: Diagnosis not present

## 2014-11-01 DIAGNOSIS — R278 Other lack of coordination: Secondary | ICD-10-CM | POA: Diagnosis not present

## 2014-11-01 DIAGNOSIS — M109 Gout, unspecified: Secondary | ICD-10-CM | POA: Diagnosis not present

## 2014-11-01 DIAGNOSIS — R269 Unspecified abnormalities of gait and mobility: Secondary | ICD-10-CM | POA: Diagnosis not present

## 2014-11-01 DIAGNOSIS — M150 Primary generalized (osteo)arthritis: Secondary | ICD-10-CM | POA: Diagnosis not present

## 2014-11-02 DIAGNOSIS — M109 Gout, unspecified: Secondary | ICD-10-CM | POA: Diagnosis not present

## 2014-11-02 DIAGNOSIS — M150 Primary generalized (osteo)arthritis: Secondary | ICD-10-CM | POA: Diagnosis not present

## 2014-11-02 DIAGNOSIS — R269 Unspecified abnormalities of gait and mobility: Secondary | ICD-10-CM | POA: Diagnosis not present

## 2014-11-02 DIAGNOSIS — R278 Other lack of coordination: Secondary | ICD-10-CM | POA: Diagnosis not present

## 2014-11-02 DIAGNOSIS — M6281 Muscle weakness (generalized): Secondary | ICD-10-CM | POA: Diagnosis not present

## 2014-11-03 DIAGNOSIS — M6281 Muscle weakness (generalized): Secondary | ICD-10-CM | POA: Diagnosis not present

## 2014-11-03 DIAGNOSIS — R278 Other lack of coordination: Secondary | ICD-10-CM | POA: Diagnosis not present

## 2014-11-03 DIAGNOSIS — M109 Gout, unspecified: Secondary | ICD-10-CM | POA: Diagnosis not present

## 2014-11-03 DIAGNOSIS — M150 Primary generalized (osteo)arthritis: Secondary | ICD-10-CM | POA: Diagnosis not present

## 2014-11-03 DIAGNOSIS — R269 Unspecified abnormalities of gait and mobility: Secondary | ICD-10-CM | POA: Diagnosis not present

## 2014-11-05 DIAGNOSIS — R278 Other lack of coordination: Secondary | ICD-10-CM | POA: Diagnosis not present

## 2014-11-05 DIAGNOSIS — R269 Unspecified abnormalities of gait and mobility: Secondary | ICD-10-CM | POA: Diagnosis not present

## 2014-11-05 DIAGNOSIS — M109 Gout, unspecified: Secondary | ICD-10-CM | POA: Diagnosis not present

## 2014-11-05 DIAGNOSIS — M150 Primary generalized (osteo)arthritis: Secondary | ICD-10-CM | POA: Diagnosis not present

## 2014-11-05 DIAGNOSIS — M6281 Muscle weakness (generalized): Secondary | ICD-10-CM | POA: Diagnosis not present

## 2014-11-06 DIAGNOSIS — M150 Primary generalized (osteo)arthritis: Secondary | ICD-10-CM | POA: Diagnosis not present

## 2014-11-06 DIAGNOSIS — R278 Other lack of coordination: Secondary | ICD-10-CM | POA: Diagnosis not present

## 2014-11-06 DIAGNOSIS — R269 Unspecified abnormalities of gait and mobility: Secondary | ICD-10-CM | POA: Diagnosis not present

## 2014-11-06 DIAGNOSIS — M6281 Muscle weakness (generalized): Secondary | ICD-10-CM | POA: Diagnosis not present

## 2014-11-06 DIAGNOSIS — M109 Gout, unspecified: Secondary | ICD-10-CM | POA: Diagnosis not present

## 2014-11-07 DIAGNOSIS — M6281 Muscle weakness (generalized): Secondary | ICD-10-CM | POA: Diagnosis not present

## 2014-11-07 DIAGNOSIS — M109 Gout, unspecified: Secondary | ICD-10-CM | POA: Diagnosis not present

## 2014-11-07 DIAGNOSIS — M150 Primary generalized (osteo)arthritis: Secondary | ICD-10-CM | POA: Diagnosis not present

## 2014-11-07 DIAGNOSIS — R269 Unspecified abnormalities of gait and mobility: Secondary | ICD-10-CM | POA: Diagnosis not present

## 2014-11-07 DIAGNOSIS — R278 Other lack of coordination: Secondary | ICD-10-CM | POA: Diagnosis not present

## 2014-11-08 DIAGNOSIS — R269 Unspecified abnormalities of gait and mobility: Secondary | ICD-10-CM | POA: Diagnosis not present

## 2014-11-08 DIAGNOSIS — M6281 Muscle weakness (generalized): Secondary | ICD-10-CM | POA: Diagnosis not present

## 2014-11-08 DIAGNOSIS — M109 Gout, unspecified: Secondary | ICD-10-CM | POA: Diagnosis not present

## 2014-11-08 DIAGNOSIS — R278 Other lack of coordination: Secondary | ICD-10-CM | POA: Diagnosis not present

## 2014-11-08 DIAGNOSIS — M150 Primary generalized (osteo)arthritis: Secondary | ICD-10-CM | POA: Diagnosis not present

## 2014-11-09 DIAGNOSIS — R269 Unspecified abnormalities of gait and mobility: Secondary | ICD-10-CM | POA: Diagnosis not present

## 2014-11-09 DIAGNOSIS — M150 Primary generalized (osteo)arthritis: Secondary | ICD-10-CM | POA: Diagnosis not present

## 2014-11-09 DIAGNOSIS — M6281 Muscle weakness (generalized): Secondary | ICD-10-CM | POA: Diagnosis not present

## 2014-11-09 DIAGNOSIS — M109 Gout, unspecified: Secondary | ICD-10-CM | POA: Diagnosis not present

## 2014-11-09 DIAGNOSIS — R278 Other lack of coordination: Secondary | ICD-10-CM | POA: Diagnosis not present

## 2014-11-14 DIAGNOSIS — R269 Unspecified abnormalities of gait and mobility: Secondary | ICD-10-CM | POA: Diagnosis not present

## 2014-11-14 DIAGNOSIS — R278 Other lack of coordination: Secondary | ICD-10-CM | POA: Diagnosis not present

## 2014-11-14 DIAGNOSIS — M6281 Muscle weakness (generalized): Secondary | ICD-10-CM | POA: Diagnosis not present

## 2014-11-14 DIAGNOSIS — M150 Primary generalized (osteo)arthritis: Secondary | ICD-10-CM | POA: Diagnosis not present

## 2014-11-14 DIAGNOSIS — M109 Gout, unspecified: Secondary | ICD-10-CM | POA: Diagnosis not present

## 2014-11-15 DIAGNOSIS — M109 Gout, unspecified: Secondary | ICD-10-CM | POA: Diagnosis not present

## 2014-11-15 DIAGNOSIS — M6281 Muscle weakness (generalized): Secondary | ICD-10-CM | POA: Diagnosis not present

## 2014-11-15 DIAGNOSIS — R278 Other lack of coordination: Secondary | ICD-10-CM | POA: Diagnosis not present

## 2014-11-15 DIAGNOSIS — R269 Unspecified abnormalities of gait and mobility: Secondary | ICD-10-CM | POA: Diagnosis not present

## 2014-11-15 DIAGNOSIS — M150 Primary generalized (osteo)arthritis: Secondary | ICD-10-CM | POA: Diagnosis not present

## 2014-11-16 DIAGNOSIS — R278 Other lack of coordination: Secondary | ICD-10-CM | POA: Diagnosis not present

## 2014-11-16 DIAGNOSIS — M150 Primary generalized (osteo)arthritis: Secondary | ICD-10-CM | POA: Diagnosis not present

## 2014-11-16 DIAGNOSIS — R269 Unspecified abnormalities of gait and mobility: Secondary | ICD-10-CM | POA: Diagnosis not present

## 2014-11-16 DIAGNOSIS — M109 Gout, unspecified: Secondary | ICD-10-CM | POA: Diagnosis not present

## 2014-11-16 DIAGNOSIS — M6281 Muscle weakness (generalized): Secondary | ICD-10-CM | POA: Diagnosis not present

## 2014-11-17 DIAGNOSIS — M109 Gout, unspecified: Secondary | ICD-10-CM | POA: Diagnosis not present

## 2014-11-17 DIAGNOSIS — R278 Other lack of coordination: Secondary | ICD-10-CM | POA: Diagnosis not present

## 2014-11-17 DIAGNOSIS — R269 Unspecified abnormalities of gait and mobility: Secondary | ICD-10-CM | POA: Diagnosis not present

## 2014-11-17 DIAGNOSIS — M150 Primary generalized (osteo)arthritis: Secondary | ICD-10-CM | POA: Diagnosis not present

## 2014-11-17 DIAGNOSIS — M6281 Muscle weakness (generalized): Secondary | ICD-10-CM | POA: Diagnosis not present

## 2014-12-02 ENCOUNTER — Encounter: Payer: Self-pay | Admitting: *Deleted

## 2014-12-02 DIAGNOSIS — N189 Chronic kidney disease, unspecified: Secondary | ICD-10-CM | POA: Diagnosis not present

## 2014-12-02 DIAGNOSIS — I2583 Coronary atherosclerosis due to lipid rich plaque: Secondary | ICD-10-CM | POA: Diagnosis not present

## 2014-12-02 DIAGNOSIS — M150 Primary generalized (osteo)arthritis: Secondary | ICD-10-CM | POA: Diagnosis not present

## 2014-12-02 DIAGNOSIS — E46 Unspecified protein-calorie malnutrition: Secondary | ICD-10-CM | POA: Diagnosis not present

## 2014-12-02 DIAGNOSIS — K5090 Crohn's disease, unspecified, without complications: Secondary | ICD-10-CM | POA: Diagnosis not present

## 2014-12-02 DIAGNOSIS — Z950 Presence of cardiac pacemaker: Secondary | ICD-10-CM | POA: Diagnosis not present

## 2014-12-02 DIAGNOSIS — I4891 Unspecified atrial fibrillation: Secondary | ICD-10-CM | POA: Diagnosis not present

## 2014-12-02 DIAGNOSIS — N400 Enlarged prostate without lower urinary tract symptoms: Secondary | ICD-10-CM | POA: Diagnosis not present

## 2014-12-14 ENCOUNTER — Ambulatory Visit: Payer: Medicare Other | Admitting: Podiatry

## 2014-12-15 DIAGNOSIS — R1311 Dysphagia, oral phase: Secondary | ICD-10-CM | POA: Diagnosis not present

## 2014-12-27 ENCOUNTER — Emergency Department (HOSPITAL_COMMUNITY)
Admission: EM | Admit: 2014-12-27 | Discharge: 2014-12-27 | Disposition: A | Discharge status: Home / Self Care | Payer: Medicare Other | Attending: Emergency Medicine | Admitting: Emergency Medicine

## 2014-12-27 ENCOUNTER — Encounter (HOSPITAL_COMMUNITY): Payer: Self-pay

## 2014-12-27 ENCOUNTER — Encounter: Payer: Self-pay | Admitting: *Deleted

## 2014-12-27 DIAGNOSIS — Z79899 Other long term (current) drug therapy: Secondary | ICD-10-CM | POA: Diagnosis not present

## 2014-12-27 DIAGNOSIS — Z8719 Personal history of other diseases of the digestive system: Secondary | ICD-10-CM | POA: Insufficient documentation

## 2014-12-27 DIAGNOSIS — H409 Unspecified glaucoma: Secondary | ICD-10-CM | POA: Insufficient documentation

## 2014-12-27 DIAGNOSIS — R404 Transient alteration of awareness: Secondary | ICD-10-CM | POA: Diagnosis not present

## 2014-12-27 DIAGNOSIS — R197 Diarrhea, unspecified: Secondary | ICD-10-CM

## 2014-12-27 DIAGNOSIS — M1990 Unspecified osteoarthritis, unspecified site: Secondary | ICD-10-CM | POA: Diagnosis not present

## 2014-12-27 DIAGNOSIS — Z7982 Long term (current) use of aspirin: Secondary | ICD-10-CM | POA: Insufficient documentation

## 2014-12-27 DIAGNOSIS — I2510 Atherosclerotic heart disease of native coronary artery without angina pectoris: Secondary | ICD-10-CM | POA: Diagnosis not present

## 2014-12-27 DIAGNOSIS — Z950 Presence of cardiac pacemaker: Secondary | ICD-10-CM | POA: Insufficient documentation

## 2014-12-27 DIAGNOSIS — Z8546 Personal history of malignant neoplasm of prostate: Secondary | ICD-10-CM | POA: Insufficient documentation

## 2014-12-27 DIAGNOSIS — Z8709 Personal history of other diseases of the respiratory system: Secondary | ICD-10-CM | POA: Insufficient documentation

## 2014-12-27 DIAGNOSIS — R531 Weakness: Secondary | ICD-10-CM | POA: Diagnosis not present

## 2014-12-27 DIAGNOSIS — N289 Disorder of kidney and ureter, unspecified: Secondary | ICD-10-CM | POA: Diagnosis not present

## 2014-12-27 DIAGNOSIS — M109 Gout, unspecified: Secondary | ICD-10-CM | POA: Insufficient documentation

## 2014-12-27 DIAGNOSIS — Z923 Personal history of irradiation: Secondary | ICD-10-CM | POA: Insufficient documentation

## 2014-12-27 DIAGNOSIS — I10 Essential (primary) hypertension: Secondary | ICD-10-CM | POA: Diagnosis not present

## 2014-12-27 LAB — CBC WITH DIFFERENTIAL/PLATELET
BASOS ABS: 0 10*3/uL (ref 0.0–0.1)
Basophils Relative: 0 % (ref 0–1)
Eosinophils Absolute: 0.1 10*3/uL (ref 0.0–0.7)
Eosinophils Relative: 1 % (ref 0–5)
HCT: 37.2 % — ABNORMAL LOW (ref 39.0–52.0)
Hemoglobin: 12.4 g/dL — ABNORMAL LOW (ref 13.0–17.0)
Lymphocytes Relative: 12 % (ref 12–46)
Lymphs Abs: 1 10*3/uL (ref 0.7–4.0)
MCH: 32.1 pg (ref 26.0–34.0)
MCHC: 33.3 g/dL (ref 30.0–36.0)
MCV: 96.4 fL (ref 78.0–100.0)
MONO ABS: 1.1 10*3/uL — AB (ref 0.1–1.0)
Monocytes Relative: 13 % — ABNORMAL HIGH (ref 3–12)
Neutro Abs: 6.5 10*3/uL (ref 1.7–7.7)
Neutrophils Relative %: 74 % (ref 43–77)
PLATELETS: 159 10*3/uL (ref 150–400)
RBC: 3.86 MIL/uL — ABNORMAL LOW (ref 4.22–5.81)
RDW: 16.2 % — AB (ref 11.5–15.5)
WBC: 8.7 10*3/uL (ref 4.0–10.5)

## 2014-12-27 LAB — URINALYSIS, ROUTINE W REFLEX MICROSCOPIC
BILIRUBIN URINE: NEGATIVE
GLUCOSE, UA: NEGATIVE mg/dL
Hgb urine dipstick: NEGATIVE
Ketones, ur: NEGATIVE mg/dL
LEUKOCYTES UA: NEGATIVE
Nitrite: NEGATIVE
PH: 5 (ref 5.0–8.0)
Protein, ur: NEGATIVE mg/dL
SPECIFIC GRAVITY, URINE: 1.018 (ref 1.005–1.030)
Urobilinogen, UA: 0.2 mg/dL (ref 0.0–1.0)

## 2014-12-27 LAB — BASIC METABOLIC PANEL WITH GFR
Anion gap: 7 (ref 5–15)
BUN: 49 mg/dL — ABNORMAL HIGH (ref 6–20)
CO2: 18 mmol/L — ABNORMAL LOW (ref 22–32)
Calcium: 9.1 mg/dL (ref 8.9–10.3)
Chloride: 109 mmol/L (ref 101–111)
Creatinine, Ser: 1.82 mg/dL — ABNORMAL HIGH (ref 0.61–1.24)
GFR calc Af Amer: 34 mL/min — ABNORMAL LOW (ref >60)
GFR calc non Af Amer: 29 mL/min — ABNORMAL LOW (ref >60)
Glucose, Bld: 98 mg/dL (ref 65–99)
Potassium: 4.6 mmol/L (ref 3.5–5.1)
Sodium: 134 mmol/L — ABNORMAL LOW (ref 135–145)

## 2014-12-27 LAB — COMPREHENSIVE METABOLIC PANEL WITH GFR
ALT: 21 U/L (ref 17–63)
AST: 22 U/L (ref 15–41)
Albumin: 3.4 g/dL — ABNORMAL LOW (ref 3.5–5.0)
Alkaline Phosphatase: 91 U/L (ref 38–126)
Anion gap: 8 (ref 5–15)
BUN: 52 mg/dL — ABNORMAL HIGH (ref 6–20)
CO2: 18 mmol/L — ABNORMAL LOW (ref 22–32)
Calcium: 9.6 mg/dL (ref 8.9–10.3)
Chloride: 106 mmol/L (ref 101–111)
Creatinine, Ser: 2.02 mg/dL — ABNORMAL HIGH (ref 0.61–1.24)
GFR calc Af Amer: 30 mL/min — ABNORMAL LOW (ref >60)
GFR calc non Af Amer: 26 mL/min — ABNORMAL LOW (ref >60)
Glucose, Bld: 111 mg/dL — ABNORMAL HIGH (ref 65–99)
Potassium: 5.1 mmol/L (ref 3.5–5.1)
Sodium: 132 mmol/L — ABNORMAL LOW (ref 135–145)
Total Bilirubin: 0.6 mg/dL (ref 0.3–1.2)
Total Protein: 8.8 g/dL — ABNORMAL HIGH (ref 6.5–8.1)

## 2014-12-27 MED ORDER — SODIUM CHLORIDE 0.9 % IV BOLUS (SEPSIS)
500.0000 mL | Freq: Once | INTRAVENOUS | Status: AC
  Administered 2014-12-27: 500 mL via INTRAVENOUS

## 2014-12-27 MED ORDER — SODIUM CHLORIDE 0.9 % IV BOLUS (SEPSIS): 500.0000 mL | Freq: Once | INTRAVENOUS | Status: DC

## 2014-12-27 NOTE — ED Notes (Signed)
Bed: WA25 Expected date:  Expected time:  Means of arrival:  Comments: EMS  

## 2014-12-27 NOTE — ED Notes (Signed)
Multiple attempts to call facility to let them know pt was coming back and had had one liter fluid

## 2014-12-27 NOTE — ED Notes (Signed)
Room temperature adjusted for pt comfort and warm blankets placed

## 2014-12-27 NOTE — ED Notes (Signed)
[facility] called to pick up pt and return to [facility]

## 2014-12-27 NOTE — ED Notes (Signed)
Report received from [facility],  [place] is here from facility, c/o diarrhea

## 2014-12-27 NOTE — ED Provider Notes (Signed)
CSN: [id number]     Arrival date & time 12/27/14  0058 History   This chart was scribed for Edward Rice, MD by Forrestine Him, ED Scribe. This patient was seen in room WA25/WA25 and the patient's care was started 1:24 AM.   Chief Complaint  Patient presents with  . Diarrhea   HPI  LEVEL 5 CAVEAT  HPI Comments: Macallan E Welker here from [facility] is a 81 y.o. male with a PMHx of colitis who presents to the Emergency Department complaining of constant, ongoing diarrhea x 4 days. Per staff, stools have been loose. Pt was also combative at approximately 11 PM last night when attempting to clean him. No known allergies to medications. Patient currently denies any pain.  Past Medical History  Diagnosis Date  . Glaucoma   . Acute bronchitis   . Hypertension   . CAD (coronary artery disease)     nuclear stress test 2006 inferoseptal and apical infarct ejection fraction 48%  . Atrioventricular block, complete   . Diverticulosis of colon with hemorrhage   . Hemorrhage of gastrointestinal tract, unspecified   . Unspecified intestinal obstruction   . Inguinal hernia   . Gallstone   . DJD (degenerative joint disease)   . Gout   . Hematoma     subdural  . Internal hemorrhoids without mention of complication   . Pacemaker   . Prostate cancer     radiation therapy in 1996/notes 10/29/2001 (05/28/2013)  . Colitis    Past Surgical History  Procedure Laterality Date  . Pacemaker placement    . Cataract extraction, bilateral    . Subtotal colectomy  12/03    Dr. Lucia Gaskins  . Lipoma excision  2001    left leg  . Burr hole for subdural hematoma      "he's had 4 ORs"/notes 10/29/2001 (05/28/2013)  . Insert / replace / remove pacemaker  1994; 11/2004    Archie Endo 10/29/2001; generator change/notes 11/23/2004  (05/28/2013)  . Pericardiocentesis  1994    post pacemaker placement/notes 03/12/2005 (05/28/2013)  . Tonsillectomy and adenoidectomy  age 86    03/12/2005 (05/28/2013)  . Colonoscopy   2009    Diverticulosis and Hemorrhoids   . Pacemaker generator change N/A 08/17/2014    Procedure: PACEMAKER GENERATOR CHANGE;  Surgeon: Deboraha Sprang, MD;  Location: [facility] CATH LAB;  Service: Cardiovascular;  Laterality: N/A;   No family history on file. History  Substance Use Topics  . Smoking status: Never Smoker   . Smokeless tobacco: Never Used  . Alcohol Use: No     Comment: Occ wine     Review of Systems  Unable to perform ROS: Other      Allergies  Review of patient's allergies indicates no known allergies.  Home Medications   Prior to Admission medications   Medication Sig Start Date End Date Taking? Authorizing Provider  allopurinol (ZYLOPRIM) 300 MG tablet Take 300 mg by mouth every morning.    Yes Historical Provider, MD  aspirin 81 MG chewable tablet Chew 81 mg by mouth every evening.    Yes Historical Provider, MD  brimonidine (ALPHAGAN P) 0.1 % SOLN Place 1 drop into both eyes 2 (two) times daily.    Yes Historical Provider, MD  Cholecalciferol (VITAMIN D) 1000 UNITS capsule Take 1,000 Units by mouth every morning.    Yes Historical Provider, MD  diphenoxylate-atropine (LOMOTIL) 2.5-0.025 MG per tablet Take 2 tablets by mouth 4 (four) times daily as needed for diarrhea or loose stools.  Yes Historical Provider, MD  famotidine (PEPCID) 20 MG tablet Take 1 tablet (20 mg total) by mouth 2 (two) times daily. 04/14/14  Yes Donne Hazel, MD  lidocaine (LIDODERM) 5 % Place 1 patch onto the skin daily. Remove & Discard patch within 12 hours or as directed by MD   Yes Historical Provider, MD  loperamide (IMODIUM A-D) 2 MG tablet Take 2 mg by mouth 4 (four) times daily as needed for diarrhea or loose stools.   Yes Historical Provider, MD  mesalamine (LIALDA) 1.2 G EC tablet Take 2.4 g by mouth 2 (two) times daily. 08/04/13  Yes Amy S Esterwood, PA-C  Multiple Vitamin (MULTIVITAMIN WITH MINERALS) TABS tablet Take 1 tablet by mouth daily.   Yes Historical Provider, MD  Multiple  Vitamins-Minerals (CENTRUM SILVER PO) Take 1 tablet by mouth every morning.    Yes Historical Provider, MD  saccharomyces boulardii (FLORASTOR) 250 MG capsule Take 1 capsule (250 mg total) by mouth 2 (two) times daily. 04/14/14  Yes Donne Hazel, MD  tamsulosin (FLOMAX) 0.4 MG CAPS capsule Take 0.4 mg by mouth daily after supper.   Yes Historical Provider, MD  Trospium Chloride (SANCTURA XR) 60 MG CP24 Take 60 mg by mouth every evening.    Yes Historical Provider, MD  vitamin C (ASCORBIC ACID) 500 MG tablet Take 1 tablet (500 mg total) by mouth daily. 08/14/13  Yes Delfina Redwood, MD  acetaminophen (TYLENOL) 325 MG tablet Take 2 tablets (650 mg total) by mouth every 6 (six) hours as needed for mild pain (or Fever >/= 101). 08/14/13   Delfina Redwood, MD  furosemide (LASIX) 20 MG tablet Take 20 mg by mouth every other day.    Historical Provider, MD  ondansetron (ZOFRAN) 4 MG tablet Take 4 mg by mouth every 6 (six) hours as needed for nausea or vomiting.    Historical Provider, MD   Triage Vitals: BP 104/66 mmHg  Pulse 79  Temp(Src) 98.7 F (37.1 C) (Oral)  Resp 20  SpO2 100%   Physical Exam  Constitutional: He appears well-developed and well-nourished. No distress.  HENT:  Head: Normocephalic and atraumatic.  Mouth/Throat: Oropharynx is clear and moist.  Eyes: EOM are normal. Pupils are equal, round, and reactive to light.  Neck: Normal range of motion. Neck supple.  Cardiovascular: Normal rate and regular rhythm.   Pulmonary/Chest: Effort normal and breath sounds normal. No respiratory distress. He has no wheezes. He has no rales.  Abdominal: Soft. Bowel sounds are normal. He exhibits no distension and no mass. There is no tenderness. There is no rebound and no guarding.  Musculoskeletal: Normal range of motion. He exhibits no edema or tenderness.  Neurological: He is alert.  Moves all extremities without deficit. Sensation is grossly intact. Oriented to person but not place or  time.  Skin: Skin is warm and dry. No rash noted. No erythema.  Psychiatric: He has a normal mood and affect. His behavior is normal.  Nursing note and vitals reviewed.   ED Course  Procedures (including critical care time)  DIAGNOSTIC STUDIES: Oxygen Saturation is 100% on RA, Normal by my interpretation.    COORDINATION OF CARE: 1:34 AM- Will order CBC, CMP, and urinalysis. Discussed treatment plan with pt at bedside and pt agreed to plan.     Labs Review Labs Reviewed  CBC WITH DIFFERENTIAL/PLATELET - Abnormal; Notable for the following:    RBC 3.86 (*)    Hemoglobin 12.4 (*)    HCT 37.2 (*)  RDW 16.2 (*)    Monocytes Relative 13 (*)    Monocytes Absolute 1.1 (*)    All other components within normal limits  COMPREHENSIVE METABOLIC PANEL - Abnormal; Notable for the following:    Sodium 132 (*)    CO2 18 (*)    Glucose, Bld 111 (*)    BUN 52 (*)    Creatinine, Ser 2.02 (*)    Total Protein 8.8 (*)    Albumin 3.4 (*)    GFR calc non Af Amer 26 (*)    GFR calc Af Amer 30 (*)    All other components within normal limits  BASIC METABOLIC PANEL - Abnormal; Notable for the following:    Sodium 134 (*)    CO2 18 (*)    BUN 49 (*)    Creatinine, Ser 1.82 (*)    GFR calc non Af Amer 29 (*)    GFR calc Af Amer 34 (*)    All other components within normal limits  URINALYSIS, ROUTINE W REFLEX MICROSCOPIC (NOT AT [facility])    Imaging Review No results found.   EKG Interpretation None      MDM   Final diagnoses:  Diarrhea  Renal insufficiency    I personally performed the services described in this documentation, which was scribed in my presence. The recorded information has been reviewed and is accurate.    Patient has mild elevation in his creatinine above his baseline. Possibly due to dehydration. Given IV fluids with mild improvement. Vital signs remained stable. With patient the discharged to nursing home to follow up closely with his primary  physician.  Edward Rice, MD 12/27/14 [phone]

## 2014-12-27 NOTE — ED Notes (Signed)
Pt has had 1 liter Normal saline via IV and has been resting comfortably,  Pt has been cooperative with our staff thus far,  BNP drawn and sent to lab

## 2014-12-27 NOTE — ED Notes (Signed)
Pt presents from [facility] with c/o diarrhea/loose stools for the past 4 days. Staff also reported that he became combative around 11 pm last night whenever staff attempted to clean him up. Staff reported that he was not acting per his norm.

## 2014-12-27 NOTE — ED Notes (Signed)
Pt from [facility].  Staff reports diarrhea since yesterday and became combative towards which is not his norm.  Pt is calm at this time.  Denies any pain.

## 2014-12-27 NOTE — Discharge Instructions (Signed)
Follow-up with your primary doctor to reevaluate your kidney function.   Diarrhea Diarrhea is frequent loose and watery bowel movements. It can cause you to feel weak and dehydrated. Dehydration can cause you to become tired and thirsty, have a dry mouth, and have decreased urination that often is dark yellow. Diarrhea is a sign of another problem, most often an infection that will not last long. In most cases, diarrhea typically lasts 2-3 days. However, it can last longer if it is a sign of something more serious. It is important to treat your diarrhea as directed by your caregiver to lessen or prevent future episodes of diarrhea. CAUSES  Some common causes include:  Gastrointestinal infections caused by viruses, bacteria, or parasites.  Food poisoning or food allergies.  Certain medicines, such as antibiotics, chemotherapy, and laxatives.  Artificial sweeteners and fructose.  Digestive disorders. HOME CARE INSTRUCTIONS  Ensure adequate fluid intake (hydration): Have 1 cup (8 oz) of fluid for each diarrhea episode. Avoid fluids that contain simple sugars or sports drinks, fruit juices, whole milk products, and sodas. Your urine should be clear or pale yellow if you are drinking enough fluids. Hydrate with an oral rehydration solution that you can purchase at pharmacies, retail stores, and online. You can prepare an oral rehydration solution at home by mixing the following ingredients together:   - tsp table salt.   tsp baking soda.   tsp salt substitute containing potassium chloride.  1  tablespoons sugar.  1 L (34 oz) of water.  Certain foods and beverages may increase the speed at which food moves through the gastrointestinal (GI) tract. These foods and beverages should be avoided and include:  Caffeinated and alcoholic beverages.  High-fiber foods, such as raw fruits and vegetables, nuts, seeds, and whole grain breads and cereals.  Foods and beverages sweetened with sugar  alcohols, such as xylitol, sorbitol, and mannitol.  Some foods may be well tolerated and may help thicken stool including:  Starchy foods, such as rice, toast, pasta, low-sugar cereal, oatmeal, grits, baked potatoes, crackers, and bagels.  Bananas.  Applesauce.  Add probiotic-rich foods to help increase healthy bacteria in the GI tract, such as yogurt and fermented milk products.  Wash your hands well after each diarrhea episode.  Only take over-the-counter or prescription medicines as directed by your caregiver.  Take a warm bath to relieve any burning or pain from frequent diarrhea episodes. SEEK IMMEDIATE MEDICAL CARE IF:   You are unable to keep fluids down.  You have persistent vomiting.  You have blood in your stool, or your stools are black and tarry.  You do not urinate in 6-8 hours, or there is only a small amount of very dark urine.  You have abdominal pain that increases or localizes.  You have weakness, dizziness, confusion, or light-headedness.  You have a severe headache.  Your diarrhea gets worse or does not get better.  You have a fever or persistent symptoms for more than 2-3 days.  You have a fever and your symptoms suddenly get worse. MAKE SURE YOU:   Understand these instructions.  Will watch your condition.  Will get help right away if you are not doing well or get worse. Document Released: 05/10/2002 Document Revised: 10/04/2013 Document Reviewed: 01/26/2012 [facility] Patient Information 2015 [place], [place]. This information is not intended to replace advice given to you by your health care provider. Make sure you discuss any questions you have with your health care provider.

## 2014-12-30 ENCOUNTER — Encounter: Payer: Self-pay | Admitting: Gastroenterology

## 2014-12-30 ENCOUNTER — Ambulatory Visit (INDEPENDENT_AMBULATORY_CARE_PROVIDER_SITE_OTHER): Payer: Medicare Other | Admitting: Gastroenterology

## 2014-12-30 VITALS — BP 102/58 | HR 66 | Ht 73.5 in

## 2014-12-30 DIAGNOSIS — Z8619 Personal history of other infectious and parasitic diseases: Secondary | ICD-10-CM | POA: Insufficient documentation

## 2014-12-30 DIAGNOSIS — Z8719 Personal history of other diseases of the digestive system: Secondary | ICD-10-CM

## 2014-12-30 DIAGNOSIS — R197 Diarrhea, unspecified: Secondary | ICD-10-CM

## 2014-12-30 NOTE — Patient Instructions (Signed)
Please start Vancomycin after stool sample is taken.   Please bring back container with stool sample to our lab.

## 2014-12-30 NOTE — Progress Notes (Addendum)
     12/30/2014 Edward Harvey [id number] January 25, 1916   History of Present Illness:  Edward Harvey is a 81 year old African American male previously known to Dr. Sharlett Iles, care assumed by Dr. Hilarie Fredrickson.  He is a resident at [facility].  He has a history of chronic diverticulosis status post subtotal colectomy for diverticular bleeding.  He also has history of segmental colitis. He is being maintained on Lialda 4.8 g daily.  He presents to our office today with his son for evaluation of flare of diarrhea.  This started 5 days ago.  Having diarrhea several times per day and having nocturnal BM's as well.  No blood reported.  Patient denies abdominal pain.  He has a history of Cdiff colitis in 04/2014, which was treated with Flagyl.  He is taking his Lialda and Florastor probiotic.  He is taking both Imodium and Lomotil for the diarrhea but it does not seem to be helping.  He was in the ED earlier this week for dehydration and was found to have acute renal failure due to the dehydration.  Was given IVF's and released.  Patient has had significant bleeding problems in the past from diverticular hemorrhage, and he also had an upper GI bleed in 2011 due to an antral ulcer with visible vessel. Other medical problems include hypertension, coronary artery disease, history of atrial fibrillation, status post pacemaker placement for complete heart block, history of asthmatic bronchitis, prostate cancer, mild dementia, chronic renal insufficiency, and a remote history of a post traumatic subdural hematoma.   Current Medications, Allergies, Past Medical History, Past Surgical History, Family History and Social History were reviewed in [employer] record.   Physical Exam: BP 102/58 mmHg  Pulse 66  Ht 6' 1.5" (1.867 m)  Wt  General: Well developed black male in no acute distress; in a wheelchair. Head: Normocephalic and atraumatic Eyes:  Sclerae anicteric, conjunctiva pink    Ears: Normal auditory acuity Lungs: Clear throughout to auscultation Heart: Regular rate and rhythm Abdomen: Soft, non-distended.  Normal bowel sounds.  Non-tender. Musculoskeletal: Symmetrical with no gross deformities  Extremities: No edema  Neurological: Alert oriented x 4, grossly non-focal Psychological:  Alert and cooperative. Normal mood and affect  Assessment and Recommendations: #38 81 year old male with a subtotal colectomy due to previous diverticular bleeds #2  History of segmental colitis:  On Lialda 4.8 grams daily.  #3  Acute onset of diarrhea 5 days ago with history of Cdiff #4  Dehydration/ARF:  Was in ER and treated with IVF's.  #5  Coronary artery disease #6  Dementia, mild #7  Status post pacemaker for complete heart block #8  History of prostate cancer #9  Prior history of small bowel obstruction  *Will check stool for Cdiff.  Will start Vancomycin 125 mg four times daily once Cdiff stool study is collected.     Addendum: Reviewed and agree with initial management. Jerene Bears, MD

## 2015-01-02 DIAGNOSIS — E875 Hyperkalemia: Secondary | ICD-10-CM | POA: Diagnosis not present

## 2015-01-02 DIAGNOSIS — N400 Enlarged prostate without lower urinary tract symptoms: Secondary | ICD-10-CM | POA: Diagnosis not present

## 2015-01-02 DIAGNOSIS — A047 Enterocolitis due to Clostridium difficile: Secondary | ICD-10-CM | POA: Diagnosis not present

## 2015-01-02 DIAGNOSIS — I10 Essential (primary) hypertension: Secondary | ICD-10-CM | POA: Diagnosis not present

## 2015-01-11 ENCOUNTER — Encounter: Payer: Self-pay | Admitting: Podiatry

## 2015-01-11 ENCOUNTER — Ambulatory Visit (INDEPENDENT_AMBULATORY_CARE_PROVIDER_SITE_OTHER): Payer: Medicare Other | Admitting: Podiatry

## 2015-01-11 DIAGNOSIS — M79676 Pain in unspecified toe(s): Secondary | ICD-10-CM | POA: Diagnosis not present

## 2015-01-11 DIAGNOSIS — B351 Tinea unguium: Secondary | ICD-10-CM

## 2015-01-12 NOTE — Progress Notes (Signed)
Patient ID: Edward Harvey, male   DOB: 31-Oct-1915, 81 y.o.   MRN: [id number]  Subjective: This patient presents today and is requesting debridement of toenails. Last visit for this service was 06/18/2013  Objective: Patient son present and treatment room Patient transfers from wheelchair to treatment chair appears somewhat confused The toenails are elongated, brittle, discolored, hypertrophic and tender to direct palpation 6-10  Assessment: Neglected symptomatic onychomycoses 6-10  Plan: Debridement of toenails 10 mechanically and electrically without any bleeding  Recommended reappoint 3 months for nail debridement  :

## 2015-01-27 ENCOUNTER — Encounter: Payer: Self-pay | Admitting: *Deleted

## 2015-01-31 DIAGNOSIS — R918 Other nonspecific abnormal finding of lung field: Secondary | ICD-10-CM | POA: Diagnosis not present

## 2015-02-08 DIAGNOSIS — M542 Cervicalgia: Secondary | ICD-10-CM | POA: Diagnosis not present

## 2015-02-20 DIAGNOSIS — E43 Unspecified severe protein-calorie malnutrition: Secondary | ICD-10-CM | POA: Diagnosis not present

## 2015-02-20 DIAGNOSIS — N400 Enlarged prostate without lower urinary tract symptoms: Secondary | ICD-10-CM | POA: Diagnosis not present

## 2015-02-20 DIAGNOSIS — F0390 Unspecified dementia without behavioral disturbance: Secondary | ICD-10-CM | POA: Diagnosis not present

## 2015-02-20 DIAGNOSIS — I10 Essential (primary) hypertension: Secondary | ICD-10-CM | POA: Diagnosis not present

## 2015-02-20 DIAGNOSIS — I2510 Atherosclerotic heart disease of native coronary artery without angina pectoris: Secondary | ICD-10-CM | POA: Diagnosis not present

## 2015-02-20 DIAGNOSIS — N183 Chronic kidney disease, stage 3 (moderate): Secondary | ICD-10-CM | POA: Diagnosis not present

## 2015-02-20 DIAGNOSIS — I4891 Unspecified atrial fibrillation: Secondary | ICD-10-CM | POA: Diagnosis not present

## 2015-02-20 DIAGNOSIS — K5090 Crohn's disease, unspecified, without complications: Secondary | ICD-10-CM | POA: Diagnosis not present

## 2015-02-24 DIAGNOSIS — R1311 Dysphagia, oral phase: Secondary | ICD-10-CM | POA: Diagnosis not present

## 2015-03-02 ENCOUNTER — Ambulatory Visit (INDEPENDENT_AMBULATORY_CARE_PROVIDER_SITE_OTHER): Payer: Medicare Other | Admitting: *Deleted

## 2015-03-02 DIAGNOSIS — Z950 Presence of cardiac pacemaker: Secondary | ICD-10-CM

## 2015-03-02 DIAGNOSIS — I442 Atrioventricular block, complete: Secondary | ICD-10-CM | POA: Diagnosis not present

## 2015-03-02 LAB — CUP PACEART INCLINIC DEVICE CHECK
Battery Voltage: 2.79 V
Brady Statistic RA Percent Paced: 13 %
Lead Channel Impedance Value: 289 Ohm
Lead Channel Pacing Threshold Amplitude: 1.25 V
Lead Channel Pacing Threshold Pulse Width: 0.5 ms
Lead Channel Setting Pacing Amplitude: 2 V
Lead Channel Setting Sensing Sensitivity: 4 mV
MDC IDC MSMT BATTERY IMPEDANCE: 1000 Ohm
MDC IDC MSMT LEADCHNL RA PACING THRESHOLD AMPLITUDE: 1 V
MDC IDC MSMT LEADCHNL RA PACING THRESHOLD PULSEWIDTH: 0.5 ms
MDC IDC MSMT LEADCHNL RA SENSING INTR AMPL: 1.7 mV
MDC IDC MSMT LEADCHNL RV IMPEDANCE VALUE: 471 Ohm
MDC IDC SESS DTM: 20160929150344
MDC IDC SET LEADCHNL RV PACING PULSEWIDTH: 0.5 ms
MDC IDC STAT BRADY RV PERCENT PACED: 99 % — AB
Pulse Gen Serial Number: 7621899

## 2015-03-02 NOTE — Progress Notes (Signed)
Pacemaker check in clinic. Normal device function. Thresholds, sensing, impedances consistent with previous measurements. Device programmed to maximize longevity. 74% mode switch + ASA, longest 75d+. Device programmed at appropriate safety margins. Histogram distribution appropriate for patient activity level. Device programmed to optimize intrinsic conduction. Estimated longevity 4.25-7.57yrs. ROV w/ SK in 35mo.

## 2015-03-06 DIAGNOSIS — M6281 Muscle weakness (generalized): Secondary | ICD-10-CM | POA: Diagnosis not present

## 2015-03-06 DIAGNOSIS — R278 Other lack of coordination: Secondary | ICD-10-CM | POA: Diagnosis not present

## 2015-03-06 DIAGNOSIS — M109 Gout, unspecified: Secondary | ICD-10-CM | POA: Diagnosis not present

## 2015-03-06 DIAGNOSIS — M150 Primary generalized (osteo)arthritis: Secondary | ICD-10-CM | POA: Diagnosis not present

## 2015-03-06 IMAGING — CR DG CHEST 2V
2 series · 2 of 2 positions shown · non-contrast
Comparison: 10/03/2011

CLINICAL DATA: Syncope.  Leukocytosis.

CHEST - 2 VIEW

[w chest lat]
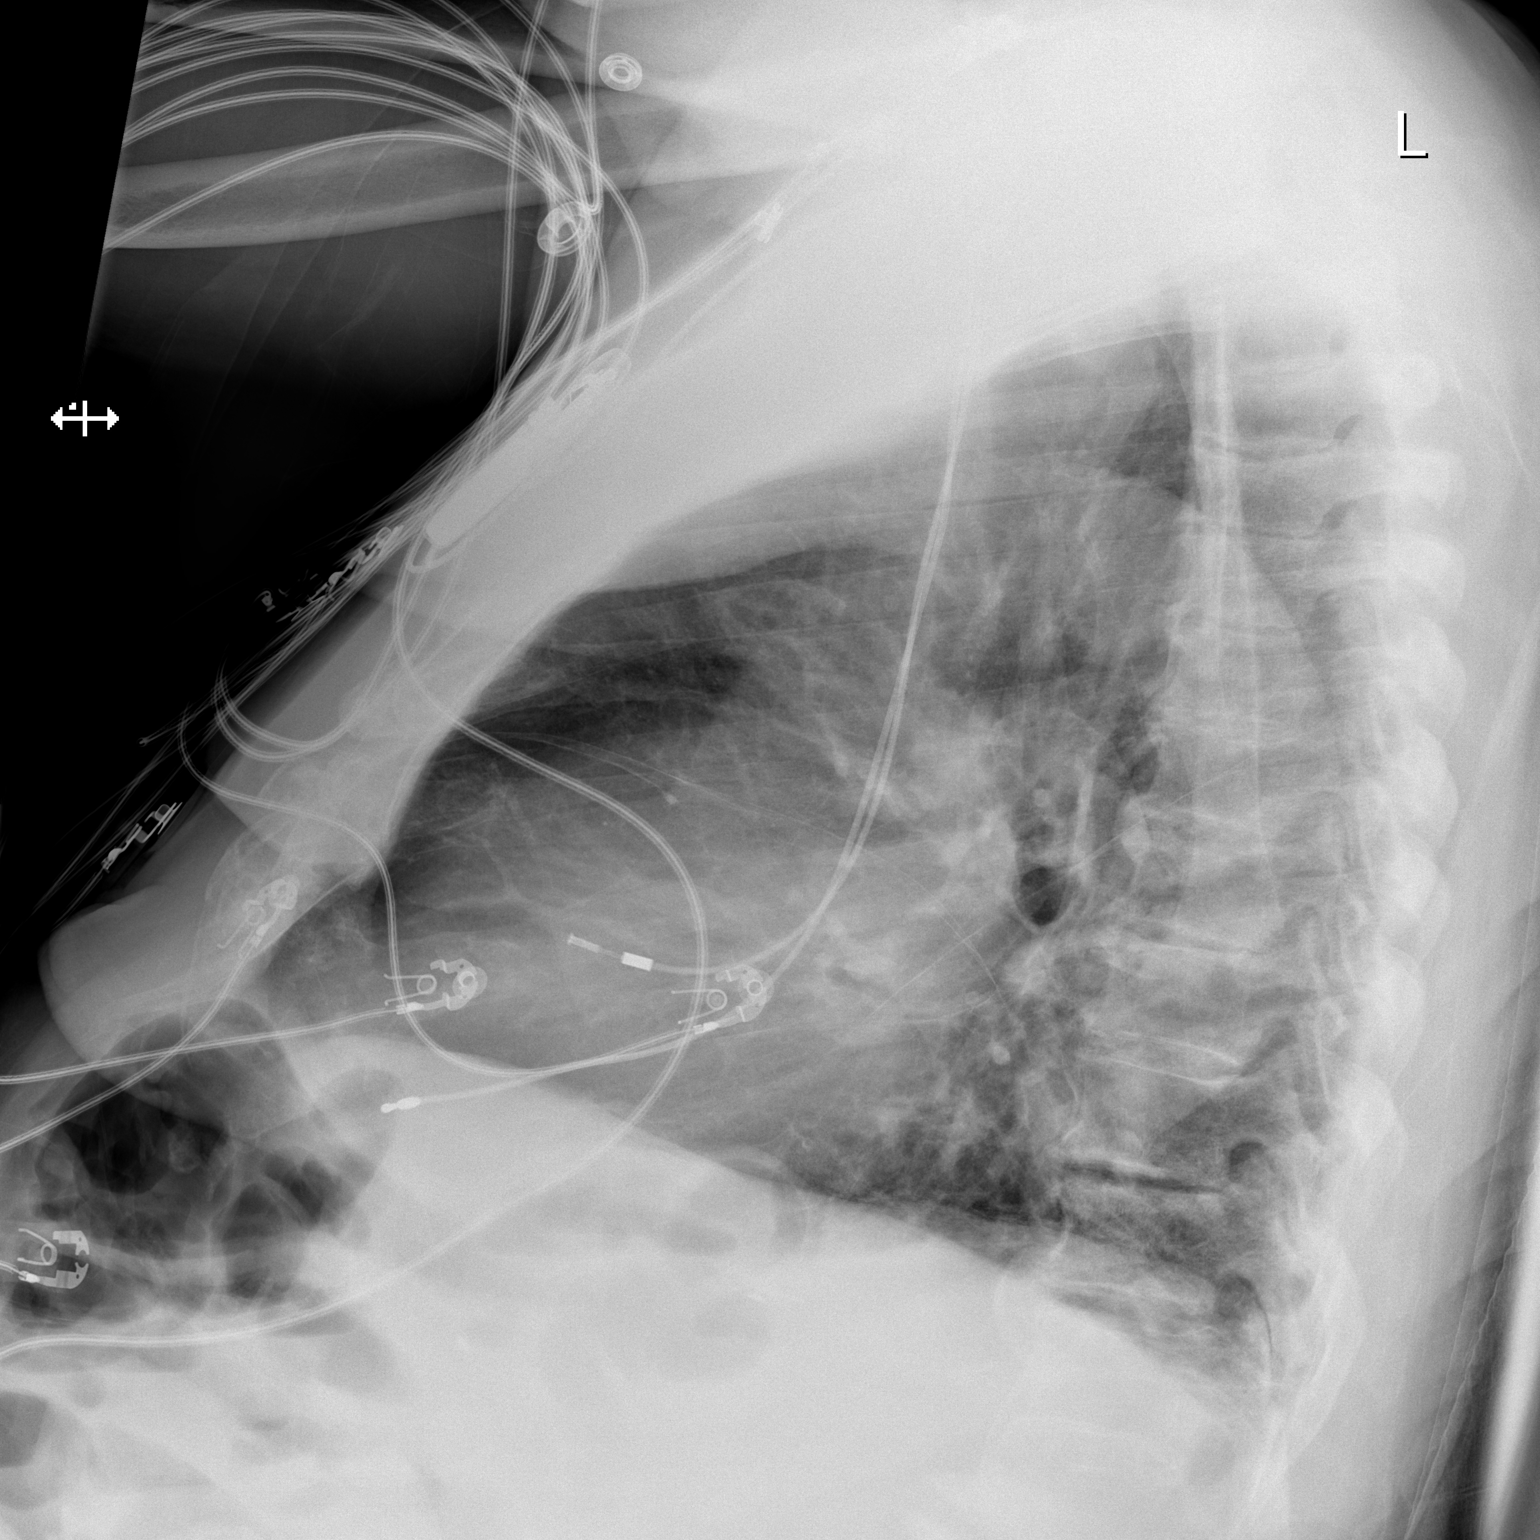

[x chest ap]
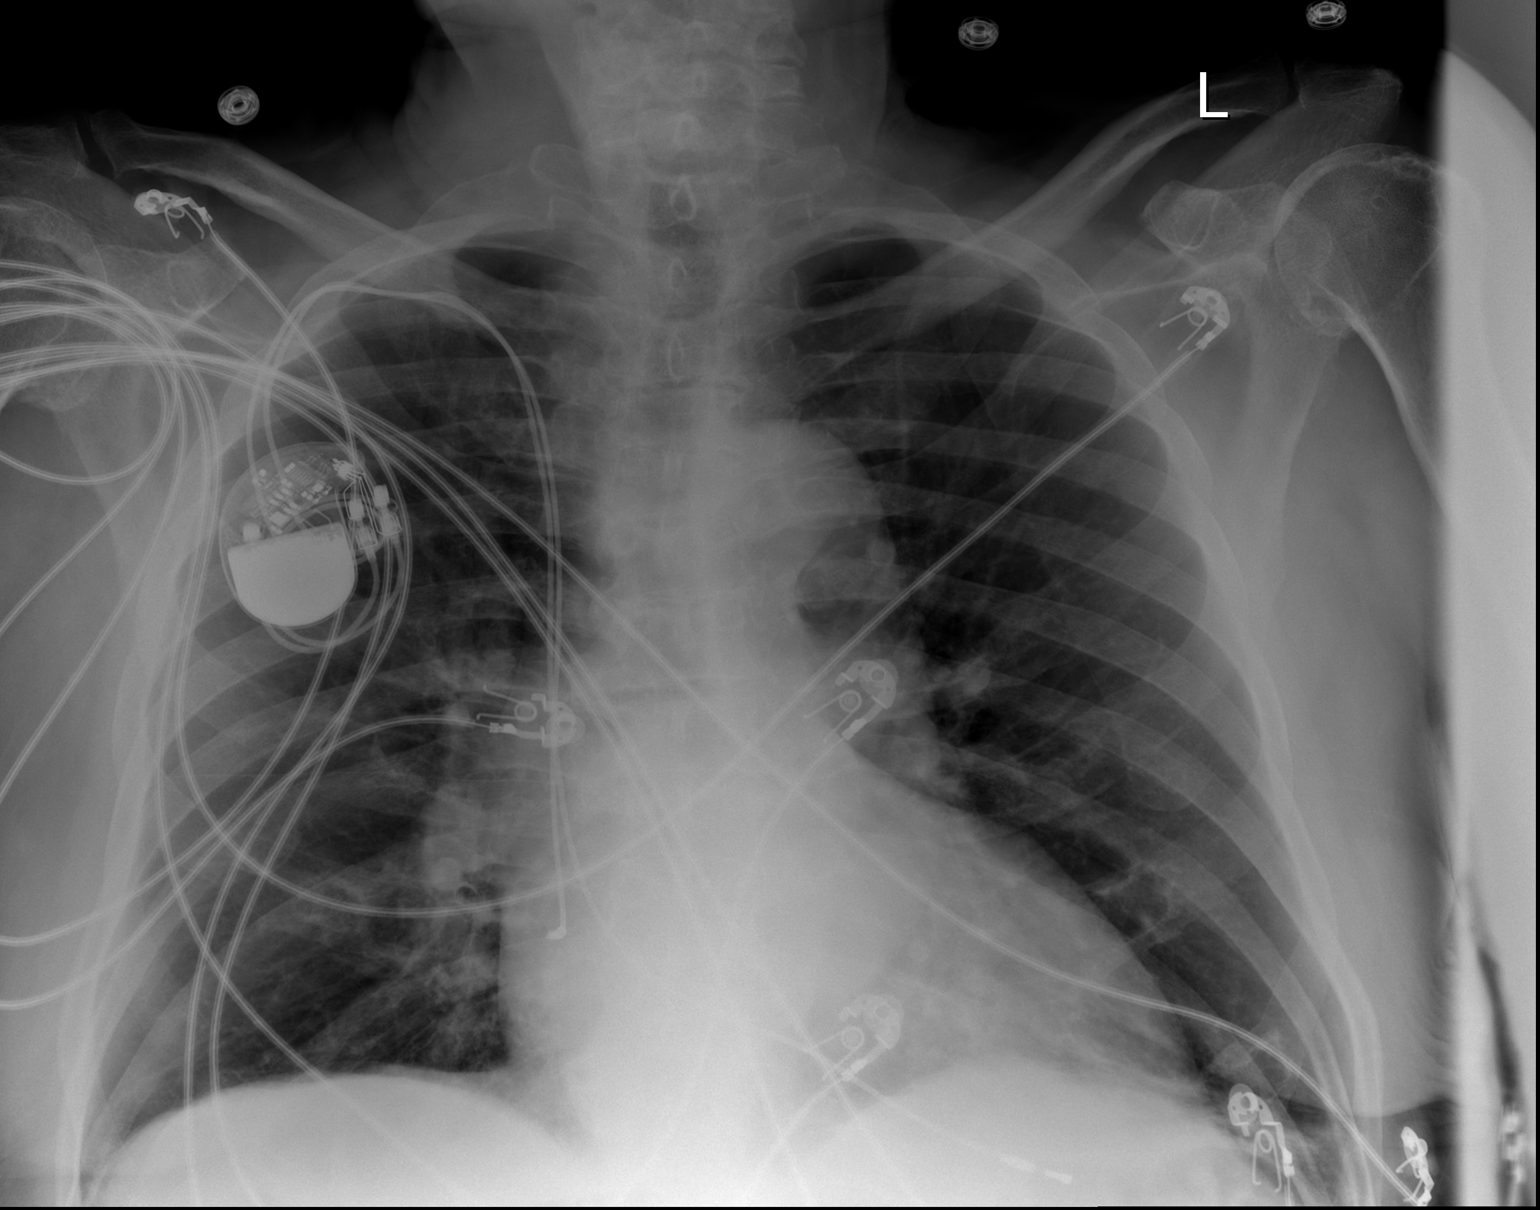

[2 of 2 positions shown; findings below may reference images not displayed]

FINDINGS: AP and lateral view of the chest demonstrates heart size
may upper normal range along with tortuosity of the thoracic aorta.
Dual lead pacer remains in place.

Stable scarring noted along the left hemidiaphragm.  Thoracic
spondylosis is present.
IMPRESSION: 1.  Stable scarring along the left hemidiaphragm.
2.  Tortuous aorta.

## 2015-03-07 DIAGNOSIS — M150 Primary generalized (osteo)arthritis: Secondary | ICD-10-CM | POA: Diagnosis not present

## 2015-03-07 DIAGNOSIS — M109 Gout, unspecified: Secondary | ICD-10-CM | POA: Diagnosis not present

## 2015-03-07 DIAGNOSIS — R278 Other lack of coordination: Secondary | ICD-10-CM | POA: Diagnosis not present

## 2015-03-07 DIAGNOSIS — M6281 Muscle weakness (generalized): Secondary | ICD-10-CM | POA: Diagnosis not present

## 2015-03-08 DIAGNOSIS — M150 Primary generalized (osteo)arthritis: Secondary | ICD-10-CM | POA: Diagnosis not present

## 2015-03-08 DIAGNOSIS — R278 Other lack of coordination: Secondary | ICD-10-CM | POA: Diagnosis not present

## 2015-03-08 DIAGNOSIS — M6281 Muscle weakness (generalized): Secondary | ICD-10-CM | POA: Diagnosis not present

## 2015-03-08 DIAGNOSIS — M109 Gout, unspecified: Secondary | ICD-10-CM | POA: Diagnosis not present

## 2015-03-09 DIAGNOSIS — M6281 Muscle weakness (generalized): Secondary | ICD-10-CM | POA: Diagnosis not present

## 2015-03-09 DIAGNOSIS — M150 Primary generalized (osteo)arthritis: Secondary | ICD-10-CM | POA: Diagnosis not present

## 2015-03-09 DIAGNOSIS — R278 Other lack of coordination: Secondary | ICD-10-CM | POA: Diagnosis not present

## 2015-03-09 DIAGNOSIS — M109 Gout, unspecified: Secondary | ICD-10-CM | POA: Diagnosis not present

## 2015-03-10 DIAGNOSIS — M6281 Muscle weakness (generalized): Secondary | ICD-10-CM | POA: Diagnosis not present

## 2015-03-10 DIAGNOSIS — M150 Primary generalized (osteo)arthritis: Secondary | ICD-10-CM | POA: Diagnosis not present

## 2015-03-10 DIAGNOSIS — R278 Other lack of coordination: Secondary | ICD-10-CM | POA: Diagnosis not present

## 2015-03-10 DIAGNOSIS — M109 Gout, unspecified: Secondary | ICD-10-CM | POA: Diagnosis not present

## 2015-03-13 DIAGNOSIS — M109 Gout, unspecified: Secondary | ICD-10-CM | POA: Diagnosis not present

## 2015-03-13 DIAGNOSIS — M150 Primary generalized (osteo)arthritis: Secondary | ICD-10-CM | POA: Diagnosis not present

## 2015-03-13 DIAGNOSIS — M6281 Muscle weakness (generalized): Secondary | ICD-10-CM | POA: Diagnosis not present

## 2015-03-13 DIAGNOSIS — R278 Other lack of coordination: Secondary | ICD-10-CM | POA: Diagnosis not present

## 2015-03-14 ENCOUNTER — Encounter: Payer: Self-pay | Admitting: Internal Medicine

## 2015-03-14 DIAGNOSIS — M6281 Muscle weakness (generalized): Secondary | ICD-10-CM | POA: Diagnosis not present

## 2015-03-14 DIAGNOSIS — M150 Primary generalized (osteo)arthritis: Secondary | ICD-10-CM | POA: Diagnosis not present

## 2015-03-14 DIAGNOSIS — R278 Other lack of coordination: Secondary | ICD-10-CM | POA: Diagnosis not present

## 2015-03-14 DIAGNOSIS — M109 Gout, unspecified: Secondary | ICD-10-CM | POA: Diagnosis not present

## 2015-03-15 DIAGNOSIS — M6281 Muscle weakness (generalized): Secondary | ICD-10-CM | POA: Diagnosis not present

## 2015-03-15 DIAGNOSIS — M109 Gout, unspecified: Secondary | ICD-10-CM | POA: Diagnosis not present

## 2015-03-15 DIAGNOSIS — R278 Other lack of coordination: Secondary | ICD-10-CM | POA: Diagnosis not present

## 2015-03-15 DIAGNOSIS — M150 Primary generalized (osteo)arthritis: Secondary | ICD-10-CM | POA: Diagnosis not present

## 2015-03-16 DIAGNOSIS — M150 Primary generalized (osteo)arthritis: Secondary | ICD-10-CM | POA: Diagnosis not present

## 2015-03-16 DIAGNOSIS — M6281 Muscle weakness (generalized): Secondary | ICD-10-CM | POA: Diagnosis not present

## 2015-03-16 DIAGNOSIS — R278 Other lack of coordination: Secondary | ICD-10-CM | POA: Diagnosis not present

## 2015-03-16 DIAGNOSIS — M109 Gout, unspecified: Secondary | ICD-10-CM | POA: Diagnosis not present

## 2015-03-17 DIAGNOSIS — M109 Gout, unspecified: Secondary | ICD-10-CM | POA: Diagnosis not present

## 2015-03-17 DIAGNOSIS — M6281 Muscle weakness (generalized): Secondary | ICD-10-CM | POA: Diagnosis not present

## 2015-03-17 DIAGNOSIS — M150 Primary generalized (osteo)arthritis: Secondary | ICD-10-CM | POA: Diagnosis not present

## 2015-03-17 DIAGNOSIS — R278 Other lack of coordination: Secondary | ICD-10-CM | POA: Diagnosis not present

## 2015-03-18 DIAGNOSIS — R278 Other lack of coordination: Secondary | ICD-10-CM | POA: Diagnosis not present

## 2015-03-18 DIAGNOSIS — M109 Gout, unspecified: Secondary | ICD-10-CM | POA: Diagnosis not present

## 2015-03-18 DIAGNOSIS — M6281 Muscle weakness (generalized): Secondary | ICD-10-CM | POA: Diagnosis not present

## 2015-03-18 DIAGNOSIS — M150 Primary generalized (osteo)arthritis: Secondary | ICD-10-CM | POA: Diagnosis not present

## 2015-03-20 DIAGNOSIS — M109 Gout, unspecified: Secondary | ICD-10-CM | POA: Diagnosis not present

## 2015-03-20 DIAGNOSIS — M6281 Muscle weakness (generalized): Secondary | ICD-10-CM | POA: Diagnosis not present

## 2015-03-20 DIAGNOSIS — R278 Other lack of coordination: Secondary | ICD-10-CM | POA: Diagnosis not present

## 2015-03-20 DIAGNOSIS — M150 Primary generalized (osteo)arthritis: Secondary | ICD-10-CM | POA: Diagnosis not present

## 2015-03-21 DIAGNOSIS — M109 Gout, unspecified: Secondary | ICD-10-CM | POA: Diagnosis not present

## 2015-03-21 DIAGNOSIS — M150 Primary generalized (osteo)arthritis: Secondary | ICD-10-CM | POA: Diagnosis not present

## 2015-03-21 DIAGNOSIS — M6281 Muscle weakness (generalized): Secondary | ICD-10-CM | POA: Diagnosis not present

## 2015-03-21 DIAGNOSIS — R278 Other lack of coordination: Secondary | ICD-10-CM | POA: Diagnosis not present

## 2015-04-04 DIAGNOSIS — E039 Hypothyroidism, unspecified: Secondary | ICD-10-CM | POA: Diagnosis not present

## 2015-04-04 DIAGNOSIS — E785 Hyperlipidemia, unspecified: Secondary | ICD-10-CM | POA: Diagnosis not present

## 2015-04-04 DIAGNOSIS — D649 Anemia, unspecified: Secondary | ICD-10-CM | POA: Diagnosis not present

## 2015-04-04 DIAGNOSIS — E559 Vitamin D deficiency, unspecified: Secondary | ICD-10-CM | POA: Diagnosis not present

## 2015-04-17 DIAGNOSIS — H401134 Primary open-angle glaucoma, bilateral, indeterminate stage: Secondary | ICD-10-CM | POA: Diagnosis not present

## 2015-04-17 DIAGNOSIS — Z961 Presence of intraocular lens: Secondary | ICD-10-CM | POA: Diagnosis not present

## 2015-04-26 ENCOUNTER — Encounter: Payer: Self-pay | Admitting: Podiatry

## 2015-04-26 ENCOUNTER — Ambulatory Visit (INDEPENDENT_AMBULATORY_CARE_PROVIDER_SITE_OTHER): Payer: Medicare Other | Admitting: Podiatry

## 2015-04-26 DIAGNOSIS — M79676 Pain in unspecified toe(s): Secondary | ICD-10-CM | POA: Diagnosis not present

## 2015-04-26 DIAGNOSIS — B351 Tinea unguium: Secondary | ICD-10-CM

## 2015-04-27 NOTE — Progress Notes (Signed)
Patient ID: Edward Harvey, male   DOB: 05-Jun-1915, 81 y.o.   MRN: [id number]  Subjective: This patient presents today with his son present in the treatment room and is requesting debridement of toenails which is son says her uncomfortable with direct shoe pair sure and walking  Objective: Patient able to transfer wheelchair to treatment chair Patient is confused and has difficulty answering questions The toenails are hypertrophic, deformed, incurvated, discolored and tender direct palpation 6-10  Assessment: Symptomatic onychomycoses 6-10  Plan: Debridement toenails 10 mechanically and electrically without any bleeding  Reappoint 3 months

## 2015-05-04 DIAGNOSIS — N400 Enlarged prostate without lower urinary tract symptoms: Secondary | ICD-10-CM | POA: Diagnosis not present

## 2015-05-04 DIAGNOSIS — K5090 Crohn's disease, unspecified, without complications: Secondary | ICD-10-CM | POA: Diagnosis not present

## 2015-05-04 DIAGNOSIS — K5669 Other intestinal obstruction: Secondary | ICD-10-CM | POA: Diagnosis not present

## 2015-05-04 DIAGNOSIS — F0390 Unspecified dementia without behavioral disturbance: Secondary | ICD-10-CM | POA: Diagnosis not present

## 2015-05-04 DIAGNOSIS — I4891 Unspecified atrial fibrillation: Secondary | ICD-10-CM | POA: Diagnosis not present

## 2015-05-04 DIAGNOSIS — I2583 Coronary atherosclerosis due to lipid rich plaque: Secondary | ICD-10-CM | POA: Diagnosis not present

## 2015-05-04 DIAGNOSIS — M109 Gout, unspecified: Secondary | ICD-10-CM | POA: Diagnosis not present

## 2015-05-04 DIAGNOSIS — I10 Essential (primary) hypertension: Secondary | ICD-10-CM | POA: Diagnosis not present

## 2015-05-10 DIAGNOSIS — D518 Other vitamin B12 deficiency anemias: Secondary | ICD-10-CM | POA: Diagnosis not present

## 2015-06-03 DIAGNOSIS — I10 Essential (primary) hypertension: Secondary | ICD-10-CM | POA: Diagnosis not present

## 2015-06-03 DIAGNOSIS — D649 Anemia, unspecified: Secondary | ICD-10-CM | POA: Diagnosis not present

## 2015-06-16 DIAGNOSIS — M25512 Pain in left shoulder: Secondary | ICD-10-CM | POA: Diagnosis not present

## 2015-06-21 ENCOUNTER — Encounter: Payer: Self-pay | Admitting: Gastroenterology

## 2015-06-30 DIAGNOSIS — K315 Obstruction of duodenum: Secondary | ICD-10-CM | POA: Diagnosis not present

## 2015-06-30 DIAGNOSIS — N400 Enlarged prostate without lower urinary tract symptoms: Secondary | ICD-10-CM | POA: Diagnosis not present

## 2015-06-30 DIAGNOSIS — F0390 Unspecified dementia without behavioral disturbance: Secondary | ICD-10-CM | POA: Diagnosis not present

## 2015-06-30 DIAGNOSIS — I4891 Unspecified atrial fibrillation: Secondary | ICD-10-CM | POA: Diagnosis not present

## 2015-06-30 DIAGNOSIS — M109 Gout, unspecified: Secondary | ICD-10-CM | POA: Diagnosis not present

## 2015-06-30 DIAGNOSIS — E875 Hyperkalemia: Secondary | ICD-10-CM | POA: Diagnosis not present

## 2015-06-30 DIAGNOSIS — K5090 Crohn's disease, unspecified, without complications: Secondary | ICD-10-CM | POA: Diagnosis not present

## 2015-06-30 DIAGNOSIS — I2510 Atherosclerotic heart disease of native coronary artery without angina pectoris: Secondary | ICD-10-CM | POA: Diagnosis not present

## 2015-07-19 ENCOUNTER — Telehealth: Payer: Self-pay

## 2015-07-20 ENCOUNTER — Encounter: Payer: Self-pay | Admitting: *Deleted

## 2015-07-21 DIAGNOSIS — M150 Primary generalized (osteo)arthritis: Secondary | ICD-10-CM | POA: Diagnosis not present

## 2015-07-21 DIAGNOSIS — R278 Other lack of coordination: Secondary | ICD-10-CM | POA: Diagnosis not present

## 2015-07-21 DIAGNOSIS — M6281 Muscle weakness (generalized): Secondary | ICD-10-CM | POA: Diagnosis not present

## 2015-07-21 DIAGNOSIS — R2681 Unsteadiness on feet: Secondary | ICD-10-CM | POA: Diagnosis not present

## 2015-07-21 DIAGNOSIS — M109 Gout, unspecified: Secondary | ICD-10-CM | POA: Diagnosis not present

## 2015-07-22 DIAGNOSIS — R2681 Unsteadiness on feet: Secondary | ICD-10-CM | POA: Diagnosis not present

## 2015-07-22 DIAGNOSIS — M109 Gout, unspecified: Secondary | ICD-10-CM | POA: Diagnosis not present

## 2015-07-22 DIAGNOSIS — M150 Primary generalized (osteo)arthritis: Secondary | ICD-10-CM | POA: Diagnosis not present

## 2015-07-22 DIAGNOSIS — R278 Other lack of coordination: Secondary | ICD-10-CM | POA: Diagnosis not present

## 2015-07-22 DIAGNOSIS — M6281 Muscle weakness (generalized): Secondary | ICD-10-CM | POA: Diagnosis not present

## 2015-07-24 DIAGNOSIS — R2681 Unsteadiness on feet: Secondary | ICD-10-CM | POA: Diagnosis not present

## 2015-07-24 DIAGNOSIS — M6281 Muscle weakness (generalized): Secondary | ICD-10-CM | POA: Diagnosis not present

## 2015-07-24 DIAGNOSIS — M109 Gout, unspecified: Secondary | ICD-10-CM | POA: Diagnosis not present

## 2015-07-24 DIAGNOSIS — R278 Other lack of coordination: Secondary | ICD-10-CM | POA: Diagnosis not present

## 2015-07-24 DIAGNOSIS — M150 Primary generalized (osteo)arthritis: Secondary | ICD-10-CM | POA: Diagnosis not present

## 2015-07-25 DIAGNOSIS — M150 Primary generalized (osteo)arthritis: Secondary | ICD-10-CM | POA: Diagnosis not present

## 2015-07-25 DIAGNOSIS — M6281 Muscle weakness (generalized): Secondary | ICD-10-CM | POA: Diagnosis not present

## 2015-07-25 DIAGNOSIS — M109 Gout, unspecified: Secondary | ICD-10-CM | POA: Diagnosis not present

## 2015-07-25 DIAGNOSIS — R278 Other lack of coordination: Secondary | ICD-10-CM | POA: Diagnosis not present

## 2015-07-25 DIAGNOSIS — N390 Urinary tract infection, site not specified: Secondary | ICD-10-CM | POA: Diagnosis not present

## 2015-07-25 DIAGNOSIS — R2681 Unsteadiness on feet: Secondary | ICD-10-CM | POA: Diagnosis not present

## 2015-07-26 ENCOUNTER — Encounter: Payer: Self-pay | Admitting: Podiatry

## 2015-07-26 ENCOUNTER — Ambulatory Visit (INDEPENDENT_AMBULATORY_CARE_PROVIDER_SITE_OTHER): Payer: Medicare Other | Admitting: Podiatry

## 2015-07-26 DIAGNOSIS — M150 Primary generalized (osteo)arthritis: Secondary | ICD-10-CM | POA: Diagnosis not present

## 2015-07-26 DIAGNOSIS — M109 Gout, unspecified: Secondary | ICD-10-CM | POA: Diagnosis not present

## 2015-07-26 DIAGNOSIS — B351 Tinea unguium: Secondary | ICD-10-CM

## 2015-07-26 DIAGNOSIS — M79676 Pain in unspecified toe(s): Secondary | ICD-10-CM | POA: Diagnosis not present

## 2015-07-26 DIAGNOSIS — M6281 Muscle weakness (generalized): Secondary | ICD-10-CM | POA: Diagnosis not present

## 2015-07-26 DIAGNOSIS — R278 Other lack of coordination: Secondary | ICD-10-CM | POA: Diagnosis not present

## 2015-07-26 DIAGNOSIS — R2681 Unsteadiness on feet: Secondary | ICD-10-CM | POA: Diagnosis not present

## 2015-07-26 NOTE — Patient Instructions (Signed)
Apply all-purpose skin lotion to right and left feet daily

## 2015-07-27 DIAGNOSIS — M109 Gout, unspecified: Secondary | ICD-10-CM | POA: Diagnosis not present

## 2015-07-27 DIAGNOSIS — R2681 Unsteadiness on feet: Secondary | ICD-10-CM | POA: Diagnosis not present

## 2015-07-27 DIAGNOSIS — R278 Other lack of coordination: Secondary | ICD-10-CM | POA: Diagnosis not present

## 2015-07-27 DIAGNOSIS — M6281 Muscle weakness (generalized): Secondary | ICD-10-CM | POA: Diagnosis not present

## 2015-07-27 DIAGNOSIS — M150 Primary generalized (osteo)arthritis: Secondary | ICD-10-CM | POA: Diagnosis not present

## 2015-07-27 NOTE — Progress Notes (Signed)
Patient ID: Edward Harvey, male   DOB: [birth year], 82 y.o.   MRN: [id number]   Subjective: This patient presents today for scheduled visit with his son present in the treatment room who is requesting debridement of his father's toenails which are elongated and are comfortable with direct palpation and shoe wearing  Objective: Patient has difficulty responding to direct questioning, however, does decrease seem to respond in a delayed manner Patient was able to transfer wheelchair to treatment chair The toenails are elongated, hypertrophic, deformed, discolored and tender to direct palpation 6-10  Assessment: Symptomatic onychomycoses 6-10  Plan: Debridement toenails 6-10 mechanically and electronically without any bleeding  Reappoint 3 months

## 2015-07-28 DIAGNOSIS — R2681 Unsteadiness on feet: Secondary | ICD-10-CM | POA: Diagnosis not present

## 2015-07-28 DIAGNOSIS — M150 Primary generalized (osteo)arthritis: Secondary | ICD-10-CM | POA: Diagnosis not present

## 2015-07-28 DIAGNOSIS — M109 Gout, unspecified: Secondary | ICD-10-CM | POA: Diagnosis not present

## 2015-07-28 DIAGNOSIS — R278 Other lack of coordination: Secondary | ICD-10-CM | POA: Diagnosis not present

## 2015-07-28 DIAGNOSIS — M6281 Muscle weakness (generalized): Secondary | ICD-10-CM | POA: Diagnosis not present

## 2015-07-31 DIAGNOSIS — M150 Primary generalized (osteo)arthritis: Secondary | ICD-10-CM | POA: Diagnosis not present

## 2015-07-31 DIAGNOSIS — M6281 Muscle weakness (generalized): Secondary | ICD-10-CM | POA: Diagnosis not present

## 2015-07-31 DIAGNOSIS — R278 Other lack of coordination: Secondary | ICD-10-CM | POA: Diagnosis not present

## 2015-07-31 DIAGNOSIS — R2681 Unsteadiness on feet: Secondary | ICD-10-CM | POA: Diagnosis not present

## 2015-07-31 DIAGNOSIS — M109 Gout, unspecified: Secondary | ICD-10-CM | POA: Diagnosis not present

## 2015-07-31 NOTE — Telephone Encounter (Signed)
Erroneous encounter

## 2015-08-01 ENCOUNTER — Ambulatory Visit (INDEPENDENT_AMBULATORY_CARE_PROVIDER_SITE_OTHER): Payer: Medicare Other | Admitting: Internal Medicine

## 2015-08-01 ENCOUNTER — Encounter: Payer: Self-pay | Admitting: Internal Medicine

## 2015-08-01 VITALS — BP 112/70 | HR 84 | Ht 73.0 in

## 2015-08-01 DIAGNOSIS — K5080 Crohn's disease of both small and large intestine without complications: Secondary | ICD-10-CM | POA: Diagnosis not present

## 2015-08-01 DIAGNOSIS — R278 Other lack of coordination: Secondary | ICD-10-CM | POA: Diagnosis not present

## 2015-08-01 DIAGNOSIS — M109 Gout, unspecified: Secondary | ICD-10-CM | POA: Diagnosis not present

## 2015-08-01 DIAGNOSIS — R2681 Unsteadiness on feet: Secondary | ICD-10-CM | POA: Diagnosis not present

## 2015-08-01 DIAGNOSIS — M150 Primary generalized (osteo)arthritis: Secondary | ICD-10-CM | POA: Diagnosis not present

## 2015-08-01 DIAGNOSIS — M6281 Muscle weakness (generalized): Secondary | ICD-10-CM | POA: Diagnosis not present

## 2015-08-01 NOTE — Progress Notes (Signed)
Patient ID: Edward Harvey, male   DOB: [birth year], 82 y.o.   MRN: [id number] HPI: Edward Harvey is a 82 yo male with PMH of Crohn's ileocolitis, history of colonic diverticulosis status post segmental colon resection for diverticular bleeding, history of C. difficile colitis who is here for follow-up at request of his skilled facility due to several days of loose stools and diarrhea. He is here today with his son. His son reports every approximate 6 months he'll have several days of loose stools and diarrhea which usually resolve. He is maintained on Lialda 4.8 g daily and Florastor 250 mg daily. He also has a standing order for Lomotil which is given when necessary. About 2 weeks ago he had several days of loose stools but this has resolved. He reports a good appetite which is supported by his son. No trouble swallowing. He denies abdominal pain. He does have frequent urination. He denies any known blood in his stool or melena.  Prior colonoscopies have revealed colitis and ileitis. He had a colonoscopy on 10/28/2007 by Dr. Sharlett Iles. This revealed ulcerating with inflammation and granular mucosa in the small bowel at ileocolonic anastomosis. Diverticulosis in the sigmoid without bleeding. Internal hemorrhoids. Biopsies revealed chronic active inflammation with focal ulceration in small bowel and colonic mucosa  Past Medical History  Diagnosis Date  . Glaucoma   . Acute bronchitis   . Hypertension   . CAD (coronary artery disease)     nuclear stress test 2006 inferoseptal and apical infarct ejection fraction 48%  . Atrioventricular block, complete ([place])   . Diverticulosis of colon with hemorrhage   . Hemorrhage of gastrointestinal tract, unspecified   . Unspecified intestinal obstruction ([place])   . Inguinal hernia   . Gallstone   . DJD (degenerative joint disease)   . Gout   . Hematoma     subdural  . Internal hemorrhoids without mention of complication   . Pacemaker   . Prostate cancer [facility])      radiation therapy in 1996/notes 10/29/2001 (05/28/2013)  . Segmental colitis ([place])   . C. difficile colitis   . Antral ulcer 2011  . Acute renal failure (ARF) ([place])   . Mild dementia   . Traumatic subdural hematoma [facility])     Past Surgical History  Procedure Laterality Date  . Pacemaker placement    . Cataract extraction, bilateral    . Subtotal colectomy  12/03    Dr. Lucia Gaskins  . Lipoma excision  2001    left leg  . Burr hole for subdural hematoma      "he's had 4 ORs"/notes 10/29/2001 (05/28/2013)  . Insert / replace / remove pacemaker  1994; 11/2004    Archie Endo 10/29/2001; generator change/notes 11/23/2004  (05/28/2013)  . Pericardiocentesis  1994    post pacemaker placement/notes 03/12/2005 (05/28/2013)  . Tonsillectomy and adenoidectomy  age 51    03/12/2005 (05/28/2013)  . Colonoscopy  2009    Diverticulosis and Hemorrhoids   . Pacemaker generator change N/A 08/17/2014    Procedure: PACEMAKER GENERATOR CHANGE;  Surgeon: Deboraha Sprang, MD;  Location: [facility] CATH LAB;  Service: Cardiovascular;  Laterality: N/A;    Outpatient Prescriptions Prior to Visit  Medication Sig Dispense Refill  . acetaminophen (TYLENOL) 325 MG tablet Take 2 tablets (650 mg total) by mouth every 6 (six) hours as needed for mild pain (or Fever >/= 101).    Marland Kitchen allopurinol (ZYLOPRIM) 300 MG tablet Take 300 mg by mouth every morning.     Marland Kitchen  aspirin 81 MG chewable tablet Chew 81 mg by mouth every evening.     . Cholecalciferol (VITAMIN D) 1000 UNITS capsule Take 1,000 Units by mouth every morning.     . diphenoxylate-atropine (LOMOTIL) 2.5-0.025 MG per tablet Take 2 tablets by mouth 4 (four) times daily as needed for diarrhea or loose stools.    . famotidine (PEPCID) 20 MG tablet Take 1 tablet (20 mg total) by mouth 2 (two) times daily. 30 tablet 0  . furosemide (LASIX) 20 MG tablet Take 20 mg by mouth every other day.    . lidocaine (LIDODERM) 5 % Place 1 patch onto the skin daily. Remove & Discard patch within 12  hours or as directed by MD    . loperamide (IMODIUM A-D) 2 MG tablet Take 2 mg by mouth 4 (four) times daily as needed for diarrhea or loose stools.    . mesalamine (LIALDA) 1.2 G EC tablet Take 2.4 g by mouth 2 (two) times daily.    . Multiple Vitamin (MULTIVITAMIN WITH MINERALS) TABS tablet Take 1 tablet by mouth daily.    . Multiple Vitamins-Minerals (CENTRUM SILVER PO) Take 1 tablet by mouth every morning.     . ondansetron (ZOFRAN) 4 MG tablet Take 4 mg by mouth every 6 (six) hours as needed for nausea or vomiting.    . tamsulosin (FLOMAX) 0.4 MG CAPS capsule Take 0.4 mg by mouth daily after supper.    . Trospium Chloride (SANCTURA XR) 60 MG CP24 Take 60 mg by mouth every evening.     . vitamin C (ASCORBIC ACID) 500 MG tablet Take 1 tablet (500 mg total) by mouth daily.    Marland Kitchen saccharomyces boulardii (FLORASTOR) 250 MG capsule Take 1 capsule (250 mg total) by mouth 2 (two) times daily. 30 capsule 0  . brimonidine (ALPHAGAN P) 0.1 % SOLN Place 1 drop into both eyes 2 (two) times daily.      No facility-administered medications prior to visit.    No Known Allergies  History reviewed. No pertinent family history.  Social History  Substance Use Topics  . Smoking status: Never Smoker   . Smokeless tobacco: Never Used  . Alcohol Use: No     Comment: Occ wine     ROS: As per history of present illness, otherwise negative  BP 112/70 mmHg  Pulse 84  Ht 6\' 1"  (1.854 m)  Wt  Constitutional: Elderly appearing male in no acute distress. HEENT: Normocephalic and atraumatic.Conjunctivae are normal.  No scleral icterus. Neck: Neck supple. Trachea midline. Cardiovascular: Normal rate, regular rhythm and intact distal pulses. 2/6 SEM  Pulmonary/chest: Effort normal and breath sounds normal. No wheezing, rales or rhonchi. Abdominal: Soft, nontender, nondistended. Bowel sounds active throughout.  Extremities: no clubbing, cyanosis, or edema Neurological: Alert and oriented to person place and  time. Skin: Skin is warm and dry.  Psychiatric: Normal mood and affect. Behavior is normal.  ASSESSMENT/PLAN:  82 yo male with PMH of Crohn's ileocolitis, history of colonic diverticulosis status post segmental colon resection for diverticular bleeding, history of C. difficile colitis who is here for follow-up at request of his skilled facility due to several days of loose stools and diarrhea.  1. Crohn's ileocolitis -- no evidence of active Crohn's disease clinically today. We discussed based on his advanced age I do not see reason to repeat coloscopy at this time. With he and his son fully agree. Should diarrhea return I would recommend stool studies for C. difficile given his history. For  now we will continue Lialda 4.8 g a day. Continue Florastor 250 mg twice a day. Lomotil can be used as needed.  He can follow-up on an as-needed basis    [id number] [address], [address] [place], [place] [address]

## 2015-08-01 NOTE — Patient Instructions (Addendum)
Please continue your Lialda 4.8 grams daily.  Take Florastor 1 capsule twice daily.  Take Lomotil as needed.  Please follow up with Dr Hilarie Fredrickson as needed.

## 2015-08-02 DIAGNOSIS — R278 Other lack of coordination: Secondary | ICD-10-CM | POA: Diagnosis not present

## 2015-08-02 DIAGNOSIS — M150 Primary generalized (osteo)arthritis: Secondary | ICD-10-CM | POA: Diagnosis not present

## 2015-08-02 DIAGNOSIS — R2681 Unsteadiness on feet: Secondary | ICD-10-CM | POA: Diagnosis not present

## 2015-08-02 DIAGNOSIS — M6281 Muscle weakness (generalized): Secondary | ICD-10-CM | POA: Diagnosis not present

## 2015-08-02 DIAGNOSIS — R41841 Cognitive communication deficit: Secondary | ICD-10-CM | POA: Diagnosis not present

## 2015-08-02 DIAGNOSIS — M109 Gout, unspecified: Secondary | ICD-10-CM | POA: Diagnosis not present

## 2015-08-03 DIAGNOSIS — M6281 Muscle weakness (generalized): Secondary | ICD-10-CM | POA: Diagnosis not present

## 2015-08-03 DIAGNOSIS — R278 Other lack of coordination: Secondary | ICD-10-CM | POA: Diagnosis not present

## 2015-08-03 DIAGNOSIS — M109 Gout, unspecified: Secondary | ICD-10-CM | POA: Diagnosis not present

## 2015-08-03 DIAGNOSIS — R41841 Cognitive communication deficit: Secondary | ICD-10-CM | POA: Diagnosis not present

## 2015-08-03 DIAGNOSIS — M150 Primary generalized (osteo)arthritis: Secondary | ICD-10-CM | POA: Diagnosis not present

## 2015-08-03 DIAGNOSIS — R2681 Unsteadiness on feet: Secondary | ICD-10-CM | POA: Diagnosis not present

## 2015-08-04 DIAGNOSIS — M150 Primary generalized (osteo)arthritis: Secondary | ICD-10-CM | POA: Diagnosis not present

## 2015-08-04 DIAGNOSIS — M6281 Muscle weakness (generalized): Secondary | ICD-10-CM | POA: Diagnosis not present

## 2015-08-04 DIAGNOSIS — R278 Other lack of coordination: Secondary | ICD-10-CM | POA: Diagnosis not present

## 2015-08-04 DIAGNOSIS — R41841 Cognitive communication deficit: Secondary | ICD-10-CM | POA: Diagnosis not present

## 2015-08-04 DIAGNOSIS — R2681 Unsteadiness on feet: Secondary | ICD-10-CM | POA: Diagnosis not present

## 2015-08-04 DIAGNOSIS — M109 Gout, unspecified: Secondary | ICD-10-CM | POA: Diagnosis not present

## 2015-08-07 DIAGNOSIS — M109 Gout, unspecified: Secondary | ICD-10-CM | POA: Diagnosis not present

## 2015-08-07 DIAGNOSIS — R2681 Unsteadiness on feet: Secondary | ICD-10-CM | POA: Diagnosis not present

## 2015-08-07 DIAGNOSIS — M150 Primary generalized (osteo)arthritis: Secondary | ICD-10-CM | POA: Diagnosis not present

## 2015-08-07 DIAGNOSIS — M6281 Muscle weakness (generalized): Secondary | ICD-10-CM | POA: Diagnosis not present

## 2015-08-07 DIAGNOSIS — R278 Other lack of coordination: Secondary | ICD-10-CM | POA: Diagnosis not present

## 2015-08-07 DIAGNOSIS — R41841 Cognitive communication deficit: Secondary | ICD-10-CM | POA: Diagnosis not present

## 2015-08-08 DIAGNOSIS — M109 Gout, unspecified: Secondary | ICD-10-CM | POA: Diagnosis not present

## 2015-08-08 DIAGNOSIS — M6281 Muscle weakness (generalized): Secondary | ICD-10-CM | POA: Diagnosis not present

## 2015-08-08 DIAGNOSIS — R2681 Unsteadiness on feet: Secondary | ICD-10-CM | POA: Diagnosis not present

## 2015-08-08 DIAGNOSIS — R41841 Cognitive communication deficit: Secondary | ICD-10-CM | POA: Diagnosis not present

## 2015-08-08 DIAGNOSIS — M150 Primary generalized (osteo)arthritis: Secondary | ICD-10-CM | POA: Diagnosis not present

## 2015-08-08 DIAGNOSIS — R278 Other lack of coordination: Secondary | ICD-10-CM | POA: Diagnosis not present

## 2015-08-09 DIAGNOSIS — R2681 Unsteadiness on feet: Secondary | ICD-10-CM | POA: Diagnosis not present

## 2015-08-09 DIAGNOSIS — R41841 Cognitive communication deficit: Secondary | ICD-10-CM | POA: Diagnosis not present

## 2015-08-09 DIAGNOSIS — M6281 Muscle weakness (generalized): Secondary | ICD-10-CM | POA: Diagnosis not present

## 2015-08-09 DIAGNOSIS — M150 Primary generalized (osteo)arthritis: Secondary | ICD-10-CM | POA: Diagnosis not present

## 2015-08-09 DIAGNOSIS — M109 Gout, unspecified: Secondary | ICD-10-CM | POA: Diagnosis not present

## 2015-08-09 DIAGNOSIS — R278 Other lack of coordination: Secondary | ICD-10-CM | POA: Diagnosis not present

## 2015-08-10 ENCOUNTER — Telehealth: Payer: Self-pay | Admitting: Internal Medicine

## 2015-08-10 DIAGNOSIS — I10 Essential (primary) hypertension: Secondary | ICD-10-CM | POA: Diagnosis not present

## 2015-08-10 DIAGNOSIS — R278 Other lack of coordination: Secondary | ICD-10-CM | POA: Diagnosis not present

## 2015-08-10 DIAGNOSIS — M6281 Muscle weakness (generalized): Secondary | ICD-10-CM | POA: Diagnosis not present

## 2015-08-10 DIAGNOSIS — R41841 Cognitive communication deficit: Secondary | ICD-10-CM | POA: Diagnosis not present

## 2015-08-10 DIAGNOSIS — D649 Anemia, unspecified: Secondary | ICD-10-CM | POA: Diagnosis not present

## 2015-08-10 DIAGNOSIS — R2681 Unsteadiness on feet: Secondary | ICD-10-CM | POA: Diagnosis not present

## 2015-08-10 DIAGNOSIS — M109 Gout, unspecified: Secondary | ICD-10-CM | POA: Diagnosis not present

## 2015-08-10 DIAGNOSIS — M150 Primary generalized (osteo)arthritis: Secondary | ICD-10-CM | POA: Diagnosis not present

## 2015-08-10 NOTE — Telephone Encounter (Signed)
Left message for Edward Harvey to call back.

## 2015-08-11 DIAGNOSIS — R41841 Cognitive communication deficit: Secondary | ICD-10-CM | POA: Diagnosis not present

## 2015-08-11 DIAGNOSIS — R278 Other lack of coordination: Secondary | ICD-10-CM | POA: Diagnosis not present

## 2015-08-11 DIAGNOSIS — M150 Primary generalized (osteo)arthritis: Secondary | ICD-10-CM | POA: Diagnosis not present

## 2015-08-11 DIAGNOSIS — R2681 Unsteadiness on feet: Secondary | ICD-10-CM | POA: Diagnosis not present

## 2015-08-11 DIAGNOSIS — M6281 Muscle weakness (generalized): Secondary | ICD-10-CM | POA: Diagnosis not present

## 2015-08-11 DIAGNOSIS — M109 Gout, unspecified: Secondary | ICD-10-CM | POA: Diagnosis not present

## 2015-08-14 DIAGNOSIS — I2510 Atherosclerotic heart disease of native coronary artery without angina pectoris: Secondary | ICD-10-CM | POA: Diagnosis not present

## 2015-08-14 DIAGNOSIS — K5669 Other intestinal obstruction: Secondary | ICD-10-CM | POA: Diagnosis not present

## 2015-08-14 DIAGNOSIS — I4891 Unspecified atrial fibrillation: Secondary | ICD-10-CM | POA: Diagnosis not present

## 2015-08-14 DIAGNOSIS — N400 Enlarged prostate without lower urinary tract symptoms: Secondary | ICD-10-CM | POA: Diagnosis not present

## 2015-08-14 DIAGNOSIS — F0390 Unspecified dementia without behavioral disturbance: Secondary | ICD-10-CM | POA: Diagnosis not present

## 2015-08-14 DIAGNOSIS — D649 Anemia, unspecified: Secondary | ICD-10-CM | POA: Diagnosis not present

## 2015-08-14 DIAGNOSIS — K5090 Crohn's disease, unspecified, without complications: Secondary | ICD-10-CM | POA: Diagnosis not present

## 2015-08-14 DIAGNOSIS — M1009 Idiopathic gout, multiple sites: Secondary | ICD-10-CM | POA: Diagnosis not present

## 2015-08-15 ENCOUNTER — Telehealth: Payer: Self-pay | Admitting: Internal Medicine

## 2015-08-15 NOTE — Telephone Encounter (Signed)
Pts nurse from nursing home calling. States pt is having 6-8 watery diarrhea stools/day. Stool was positive last week for blood. States they are giving him lomotil 4 times a day and it is not helping. Pt has not been on antibiotics recently. Nurse states pts Hgb last week was 12.2. States pt does not look good, color poor. Please advise.

## 2015-08-16 DIAGNOSIS — F0390 Unspecified dementia without behavioral disturbance: Secondary | ICD-10-CM | POA: Diagnosis not present

## 2015-08-16 DIAGNOSIS — K529 Noninfective gastroenteritis and colitis, unspecified: Secondary | ICD-10-CM | POA: Diagnosis not present

## 2015-08-16 DIAGNOSIS — R197 Diarrhea, unspecified: Secondary | ICD-10-CM | POA: Diagnosis not present

## 2015-08-16 DIAGNOSIS — D649 Anemia, unspecified: Secondary | ICD-10-CM | POA: Diagnosis not present

## 2015-08-16 DIAGNOSIS — I10 Essential (primary) hypertension: Secondary | ICD-10-CM | POA: Diagnosis not present

## 2015-08-16 NOTE — Telephone Encounter (Signed)
Nursing home called again - states he is having diarrhea again.  On clear liquids.

## 2015-08-16 NOTE — Telephone Encounter (Signed)
Spoke with Wells Guiles and she will have the facility NP see the pt and order the labs. States they already have the order for the lomotil. She knows if diarrhea continues to be that severe pt should be seen in the ER.

## 2015-08-16 NOTE — Telephone Encounter (Signed)
C. difficile PCR ordered stat Stool culture Lomotil can be used 1-2 tablets 4 times a day when necessary Would recommend checking CBC and repeat CMP Patient should be examined by facility doctor If diarrhea remain severe he may need to be seen in the emergency department

## 2015-08-16 NOTE — Telephone Encounter (Signed)
See additional phone notes. 

## 2015-08-19 DIAGNOSIS — D649 Anemia, unspecified: Secondary | ICD-10-CM | POA: Diagnosis not present

## 2015-08-19 DIAGNOSIS — E559 Vitamin D deficiency, unspecified: Secondary | ICD-10-CM | POA: Diagnosis not present

## 2015-08-19 DIAGNOSIS — E039 Hypothyroidism, unspecified: Secondary | ICD-10-CM | POA: Diagnosis not present

## 2015-08-19 DIAGNOSIS — D518 Other vitamin B12 deficiency anemias: Secondary | ICD-10-CM | POA: Diagnosis not present

## 2015-08-24 DIAGNOSIS — R2681 Unsteadiness on feet: Secondary | ICD-10-CM | POA: Diagnosis not present

## 2015-08-24 DIAGNOSIS — R41841 Cognitive communication deficit: Secondary | ICD-10-CM | POA: Diagnosis not present

## 2015-08-24 DIAGNOSIS — R278 Other lack of coordination: Secondary | ICD-10-CM | POA: Diagnosis not present

## 2015-08-24 DIAGNOSIS — M150 Primary generalized (osteo)arthritis: Secondary | ICD-10-CM | POA: Diagnosis not present

## 2015-08-24 DIAGNOSIS — M6281 Muscle weakness (generalized): Secondary | ICD-10-CM | POA: Diagnosis not present

## 2015-08-24 DIAGNOSIS — M109 Gout, unspecified: Secondary | ICD-10-CM | POA: Diagnosis not present

## 2015-08-25 DIAGNOSIS — R2681 Unsteadiness on feet: Secondary | ICD-10-CM | POA: Diagnosis not present

## 2015-08-25 DIAGNOSIS — M150 Primary generalized (osteo)arthritis: Secondary | ICD-10-CM | POA: Diagnosis not present

## 2015-08-25 DIAGNOSIS — R278 Other lack of coordination: Secondary | ICD-10-CM | POA: Diagnosis not present

## 2015-08-25 DIAGNOSIS — R41841 Cognitive communication deficit: Secondary | ICD-10-CM | POA: Diagnosis not present

## 2015-08-25 DIAGNOSIS — M6281 Muscle weakness (generalized): Secondary | ICD-10-CM | POA: Diagnosis not present

## 2015-08-25 DIAGNOSIS — M109 Gout, unspecified: Secondary | ICD-10-CM | POA: Diagnosis not present

## 2015-08-28 DIAGNOSIS — R278 Other lack of coordination: Secondary | ICD-10-CM | POA: Diagnosis not present

## 2015-08-28 DIAGNOSIS — R2681 Unsteadiness on feet: Secondary | ICD-10-CM | POA: Diagnosis not present

## 2015-08-28 DIAGNOSIS — R41841 Cognitive communication deficit: Secondary | ICD-10-CM | POA: Diagnosis not present

## 2015-08-28 DIAGNOSIS — M109 Gout, unspecified: Secondary | ICD-10-CM | POA: Diagnosis not present

## 2015-08-28 DIAGNOSIS — M6281 Muscle weakness (generalized): Secondary | ICD-10-CM | POA: Diagnosis not present

## 2015-08-28 DIAGNOSIS — M150 Primary generalized (osteo)arthritis: Secondary | ICD-10-CM | POA: Diagnosis not present

## 2015-08-29 DIAGNOSIS — R41841 Cognitive communication deficit: Secondary | ICD-10-CM | POA: Diagnosis not present

## 2015-08-29 DIAGNOSIS — M6281 Muscle weakness (generalized): Secondary | ICD-10-CM | POA: Diagnosis not present

## 2015-08-29 DIAGNOSIS — M150 Primary generalized (osteo)arthritis: Secondary | ICD-10-CM | POA: Diagnosis not present

## 2015-08-29 DIAGNOSIS — M109 Gout, unspecified: Secondary | ICD-10-CM | POA: Diagnosis not present

## 2015-08-29 DIAGNOSIS — R2681 Unsteadiness on feet: Secondary | ICD-10-CM | POA: Diagnosis not present

## 2015-08-29 DIAGNOSIS — R278 Other lack of coordination: Secondary | ICD-10-CM | POA: Diagnosis not present

## 2015-08-30 DIAGNOSIS — R41841 Cognitive communication deficit: Secondary | ICD-10-CM | POA: Diagnosis not present

## 2015-08-30 DIAGNOSIS — M150 Primary generalized (osteo)arthritis: Secondary | ICD-10-CM | POA: Diagnosis not present

## 2015-08-30 DIAGNOSIS — R2681 Unsteadiness on feet: Secondary | ICD-10-CM | POA: Diagnosis not present

## 2015-08-30 DIAGNOSIS — M6281 Muscle weakness (generalized): Secondary | ICD-10-CM | POA: Diagnosis not present

## 2015-08-30 DIAGNOSIS — R278 Other lack of coordination: Secondary | ICD-10-CM | POA: Diagnosis not present

## 2015-08-30 DIAGNOSIS — M109 Gout, unspecified: Secondary | ICD-10-CM | POA: Diagnosis not present

## 2015-08-31 DIAGNOSIS — R278 Other lack of coordination: Secondary | ICD-10-CM | POA: Diagnosis not present

## 2015-08-31 DIAGNOSIS — R2681 Unsteadiness on feet: Secondary | ICD-10-CM | POA: Diagnosis not present

## 2015-08-31 DIAGNOSIS — R41841 Cognitive communication deficit: Secondary | ICD-10-CM | POA: Diagnosis not present

## 2015-08-31 DIAGNOSIS — M6281 Muscle weakness (generalized): Secondary | ICD-10-CM | POA: Diagnosis not present

## 2015-08-31 DIAGNOSIS — M109 Gout, unspecified: Secondary | ICD-10-CM | POA: Diagnosis not present

## 2015-08-31 DIAGNOSIS — M150 Primary generalized (osteo)arthritis: Secondary | ICD-10-CM | POA: Diagnosis not present

## 2015-09-02 DIAGNOSIS — M6281 Muscle weakness (generalized): Secondary | ICD-10-CM | POA: Diagnosis not present

## 2015-09-02 DIAGNOSIS — R41841 Cognitive communication deficit: Secondary | ICD-10-CM | POA: Diagnosis not present

## 2015-09-02 DIAGNOSIS — M150 Primary generalized (osteo)arthritis: Secondary | ICD-10-CM | POA: Diagnosis not present

## 2015-09-04 DIAGNOSIS — M150 Primary generalized (osteo)arthritis: Secondary | ICD-10-CM | POA: Diagnosis not present

## 2015-09-04 DIAGNOSIS — M6281 Muscle weakness (generalized): Secondary | ICD-10-CM | POA: Diagnosis not present

## 2015-09-04 DIAGNOSIS — R41841 Cognitive communication deficit: Secondary | ICD-10-CM | POA: Diagnosis not present

## 2015-09-06 DIAGNOSIS — M6281 Muscle weakness (generalized): Secondary | ICD-10-CM | POA: Diagnosis not present

## 2015-09-06 DIAGNOSIS — M150 Primary generalized (osteo)arthritis: Secondary | ICD-10-CM | POA: Diagnosis not present

## 2015-09-06 DIAGNOSIS — R41841 Cognitive communication deficit: Secondary | ICD-10-CM | POA: Diagnosis not present

## 2015-09-07 DIAGNOSIS — M6281 Muscle weakness (generalized): Secondary | ICD-10-CM | POA: Diagnosis not present

## 2015-09-07 DIAGNOSIS — R41841 Cognitive communication deficit: Secondary | ICD-10-CM | POA: Diagnosis not present

## 2015-09-07 DIAGNOSIS — M150 Primary generalized (osteo)arthritis: Secondary | ICD-10-CM | POA: Diagnosis not present

## 2015-09-11 DIAGNOSIS — R319 Hematuria, unspecified: Secondary | ICD-10-CM | POA: Diagnosis not present

## 2015-09-11 DIAGNOSIS — N390 Urinary tract infection, site not specified: Secondary | ICD-10-CM | POA: Diagnosis not present

## 2015-09-19 ENCOUNTER — Telehealth: Payer: Self-pay | Admitting: Internal Medicine

## 2015-09-19 DIAGNOSIS — W19XXXA Unspecified fall, initial encounter: Secondary | ICD-10-CM | POA: Diagnosis not present

## 2015-09-19 DIAGNOSIS — M25551 Pain in right hip: Secondary | ICD-10-CM | POA: Diagnosis not present

## 2015-09-19 MED ORDER — MESALAMINE ER 500 MG PO CPCR: 1000.0000 mg | ORAL_CAPSULE | Freq: Three times a day (TID) | ORAL | Status: DC

## 2015-09-19 NOTE — Telephone Encounter (Signed)
Can change to Pentasa which can be opened and sprinkled onto applesauce or pudding 1g TID

## 2015-09-19 NOTE — Telephone Encounter (Signed)
Spoke with Wells Guiles and order for Pentasa given.

## 2015-09-19 NOTE — Telephone Encounter (Signed)
Pt has been taking lialda and he is not swallowing whole pills now. Nursing home requests a liquid medication or one that can be crushed be called in for pt. Please advise.

## 2015-09-20 DIAGNOSIS — W19XXXA Unspecified fall, initial encounter: Secondary | ICD-10-CM | POA: Diagnosis not present

## 2015-09-20 DIAGNOSIS — M25561 Pain in right knee: Secondary | ICD-10-CM | POA: Diagnosis not present

## 2015-09-21 DIAGNOSIS — M6281 Muscle weakness (generalized): Secondary | ICD-10-CM | POA: Diagnosis not present

## 2015-09-21 DIAGNOSIS — M150 Primary generalized (osteo)arthritis: Secondary | ICD-10-CM | POA: Diagnosis not present

## 2015-09-21 DIAGNOSIS — R41841 Cognitive communication deficit: Secondary | ICD-10-CM | POA: Diagnosis not present

## 2015-09-22 DIAGNOSIS — M6281 Muscle weakness (generalized): Secondary | ICD-10-CM | POA: Diagnosis not present

## 2015-09-22 DIAGNOSIS — K5090 Crohn's disease, unspecified, without complications: Secondary | ICD-10-CM | POA: Diagnosis not present

## 2015-09-22 DIAGNOSIS — R197 Diarrhea, unspecified: Secondary | ICD-10-CM | POA: Diagnosis not present

## 2015-09-22 DIAGNOSIS — R41841 Cognitive communication deficit: Secondary | ICD-10-CM | POA: Diagnosis not present

## 2015-09-22 DIAGNOSIS — M150 Primary generalized (osteo)arthritis: Secondary | ICD-10-CM | POA: Diagnosis not present

## 2015-09-22 DIAGNOSIS — D649 Anemia, unspecified: Secondary | ICD-10-CM | POA: Diagnosis not present

## 2015-09-22 DIAGNOSIS — I10 Essential (primary) hypertension: Secondary | ICD-10-CM | POA: Diagnosis not present

## 2015-09-22 DIAGNOSIS — I482 Chronic atrial fibrillation: Secondary | ICD-10-CM | POA: Diagnosis not present

## 2015-09-28 DIAGNOSIS — M6281 Muscle weakness (generalized): Secondary | ICD-10-CM | POA: Diagnosis not present

## 2015-09-28 DIAGNOSIS — R41841 Cognitive communication deficit: Secondary | ICD-10-CM | POA: Diagnosis not present

## 2015-09-28 DIAGNOSIS — M150 Primary generalized (osteo)arthritis: Secondary | ICD-10-CM | POA: Diagnosis not present

## 2015-10-02 DIAGNOSIS — R2681 Unsteadiness on feet: Secondary | ICD-10-CM | POA: Diagnosis not present

## 2015-10-02 DIAGNOSIS — M150 Primary generalized (osteo)arthritis: Secondary | ICD-10-CM | POA: Diagnosis not present

## 2015-10-02 DIAGNOSIS — M6281 Muscle weakness (generalized): Secondary | ICD-10-CM | POA: Diagnosis not present

## 2015-10-02 DIAGNOSIS — R41841 Cognitive communication deficit: Secondary | ICD-10-CM | POA: Diagnosis not present

## 2015-10-03 DIAGNOSIS — E785 Hyperlipidemia, unspecified: Secondary | ICD-10-CM | POA: Diagnosis not present

## 2015-10-03 DIAGNOSIS — M150 Primary generalized (osteo)arthritis: Secondary | ICD-10-CM | POA: Diagnosis not present

## 2015-10-03 DIAGNOSIS — M6281 Muscle weakness (generalized): Secondary | ICD-10-CM | POA: Diagnosis not present

## 2015-10-03 DIAGNOSIS — Z79899 Other long term (current) drug therapy: Secondary | ICD-10-CM | POA: Diagnosis not present

## 2015-10-03 DIAGNOSIS — R41841 Cognitive communication deficit: Secondary | ICD-10-CM | POA: Diagnosis not present

## 2015-10-03 DIAGNOSIS — R2681 Unsteadiness on feet: Secondary | ICD-10-CM | POA: Diagnosis not present

## 2015-10-03 DIAGNOSIS — D649 Anemia, unspecified: Secondary | ICD-10-CM | POA: Diagnosis not present

## 2015-10-03 DIAGNOSIS — I10 Essential (primary) hypertension: Secondary | ICD-10-CM | POA: Diagnosis not present

## 2015-10-05 DIAGNOSIS — R41841 Cognitive communication deficit: Secondary | ICD-10-CM | POA: Diagnosis not present

## 2015-10-05 DIAGNOSIS — M6281 Muscle weakness (generalized): Secondary | ICD-10-CM | POA: Diagnosis not present

## 2015-10-05 DIAGNOSIS — M150 Primary generalized (osteo)arthritis: Secondary | ICD-10-CM | POA: Diagnosis not present

## 2015-10-05 DIAGNOSIS — R2681 Unsteadiness on feet: Secondary | ICD-10-CM | POA: Diagnosis not present

## 2015-10-09 DIAGNOSIS — M150 Primary generalized (osteo)arthritis: Secondary | ICD-10-CM | POA: Diagnosis not present

## 2015-10-09 DIAGNOSIS — R41841 Cognitive communication deficit: Secondary | ICD-10-CM | POA: Diagnosis not present

## 2015-10-09 DIAGNOSIS — M6281 Muscle weakness (generalized): Secondary | ICD-10-CM | POA: Diagnosis not present

## 2015-10-09 DIAGNOSIS — R2681 Unsteadiness on feet: Secondary | ICD-10-CM | POA: Diagnosis not present

## 2015-10-10 DIAGNOSIS — M6281 Muscle weakness (generalized): Secondary | ICD-10-CM | POA: Diagnosis not present

## 2015-10-10 DIAGNOSIS — R41841 Cognitive communication deficit: Secondary | ICD-10-CM | POA: Diagnosis not present

## 2015-10-10 DIAGNOSIS — M150 Primary generalized (osteo)arthritis: Secondary | ICD-10-CM | POA: Diagnosis not present

## 2015-10-10 DIAGNOSIS — R2681 Unsteadiness on feet: Secondary | ICD-10-CM | POA: Diagnosis not present

## 2015-10-11 DIAGNOSIS — R41841 Cognitive communication deficit: Secondary | ICD-10-CM | POA: Diagnosis not present

## 2015-10-11 DIAGNOSIS — M150 Primary generalized (osteo)arthritis: Secondary | ICD-10-CM | POA: Diagnosis not present

## 2015-10-11 DIAGNOSIS — M6281 Muscle weakness (generalized): Secondary | ICD-10-CM | POA: Diagnosis not present

## 2015-10-11 DIAGNOSIS — R2681 Unsteadiness on feet: Secondary | ICD-10-CM | POA: Diagnosis not present

## 2015-10-12 DIAGNOSIS — R2681 Unsteadiness on feet: Secondary | ICD-10-CM | POA: Diagnosis not present

## 2015-10-12 DIAGNOSIS — R41841 Cognitive communication deficit: Secondary | ICD-10-CM | POA: Diagnosis not present

## 2015-10-12 DIAGNOSIS — M150 Primary generalized (osteo)arthritis: Secondary | ICD-10-CM | POA: Diagnosis not present

## 2015-10-12 DIAGNOSIS — M6281 Muscle weakness (generalized): Secondary | ICD-10-CM | POA: Diagnosis not present

## 2015-10-13 DIAGNOSIS — R2681 Unsteadiness on feet: Secondary | ICD-10-CM | POA: Diagnosis not present

## 2015-10-13 DIAGNOSIS — M150 Primary generalized (osteo)arthritis: Secondary | ICD-10-CM | POA: Diagnosis not present

## 2015-10-13 DIAGNOSIS — R41841 Cognitive communication deficit: Secondary | ICD-10-CM | POA: Diagnosis not present

## 2015-10-13 DIAGNOSIS — M6281 Muscle weakness (generalized): Secondary | ICD-10-CM | POA: Diagnosis not present

## 2015-10-16 DIAGNOSIS — R41841 Cognitive communication deficit: Secondary | ICD-10-CM | POA: Diagnosis not present

## 2015-10-16 DIAGNOSIS — M6281 Muscle weakness (generalized): Secondary | ICD-10-CM | POA: Diagnosis not present

## 2015-10-16 DIAGNOSIS — M150 Primary generalized (osteo)arthritis: Secondary | ICD-10-CM | POA: Diagnosis not present

## 2015-10-16 DIAGNOSIS — R2681 Unsteadiness on feet: Secondary | ICD-10-CM | POA: Diagnosis not present

## 2015-10-17 DIAGNOSIS — M6281 Muscle weakness (generalized): Secondary | ICD-10-CM | POA: Diagnosis not present

## 2015-10-17 DIAGNOSIS — M150 Primary generalized (osteo)arthritis: Secondary | ICD-10-CM | POA: Diagnosis not present

## 2015-10-17 DIAGNOSIS — R2681 Unsteadiness on feet: Secondary | ICD-10-CM | POA: Diagnosis not present

## 2015-10-17 DIAGNOSIS — R41841 Cognitive communication deficit: Secondary | ICD-10-CM | POA: Diagnosis not present

## 2015-10-18 DIAGNOSIS — R41841 Cognitive communication deficit: Secondary | ICD-10-CM | POA: Diagnosis not present

## 2015-10-18 DIAGNOSIS — M6281 Muscle weakness (generalized): Secondary | ICD-10-CM | POA: Diagnosis not present

## 2015-10-18 DIAGNOSIS — R2681 Unsteadiness on feet: Secondary | ICD-10-CM | POA: Diagnosis not present

## 2015-10-18 DIAGNOSIS — M150 Primary generalized (osteo)arthritis: Secondary | ICD-10-CM | POA: Diagnosis not present

## 2015-10-19 DIAGNOSIS — M150 Primary generalized (osteo)arthritis: Secondary | ICD-10-CM | POA: Diagnosis not present

## 2015-10-19 DIAGNOSIS — R41841 Cognitive communication deficit: Secondary | ICD-10-CM | POA: Diagnosis not present

## 2015-10-19 DIAGNOSIS — M6281 Muscle weakness (generalized): Secondary | ICD-10-CM | POA: Diagnosis not present

## 2015-10-19 DIAGNOSIS — R2681 Unsteadiness on feet: Secondary | ICD-10-CM | POA: Diagnosis not present

## 2015-10-23 DIAGNOSIS — R41841 Cognitive communication deficit: Secondary | ICD-10-CM | POA: Diagnosis not present

## 2015-10-23 DIAGNOSIS — R2681 Unsteadiness on feet: Secondary | ICD-10-CM | POA: Diagnosis not present

## 2015-10-23 DIAGNOSIS — M6281 Muscle weakness (generalized): Secondary | ICD-10-CM | POA: Diagnosis not present

## 2015-10-23 DIAGNOSIS — M150 Primary generalized (osteo)arthritis: Secondary | ICD-10-CM | POA: Diagnosis not present

## 2015-10-24 DIAGNOSIS — R2681 Unsteadiness on feet: Secondary | ICD-10-CM | POA: Diagnosis not present

## 2015-10-24 DIAGNOSIS — R41841 Cognitive communication deficit: Secondary | ICD-10-CM | POA: Diagnosis not present

## 2015-10-24 DIAGNOSIS — M6281 Muscle weakness (generalized): Secondary | ICD-10-CM | POA: Diagnosis not present

## 2015-10-24 DIAGNOSIS — M150 Primary generalized (osteo)arthritis: Secondary | ICD-10-CM | POA: Diagnosis not present

## 2015-10-25 ENCOUNTER — Ambulatory Visit: Payer: Medicare Other | Admitting: Podiatry

## 2015-10-25 DIAGNOSIS — M150 Primary generalized (osteo)arthritis: Secondary | ICD-10-CM | POA: Diagnosis not present

## 2015-10-25 DIAGNOSIS — R2681 Unsteadiness on feet: Secondary | ICD-10-CM | POA: Diagnosis not present

## 2015-10-25 DIAGNOSIS — M6281 Muscle weakness (generalized): Secondary | ICD-10-CM | POA: Diagnosis not present

## 2015-10-25 DIAGNOSIS — R41841 Cognitive communication deficit: Secondary | ICD-10-CM | POA: Diagnosis not present

## 2015-10-26 DIAGNOSIS — M150 Primary generalized (osteo)arthritis: Secondary | ICD-10-CM | POA: Diagnosis not present

## 2015-10-26 DIAGNOSIS — R2681 Unsteadiness on feet: Secondary | ICD-10-CM | POA: Diagnosis not present

## 2015-10-26 DIAGNOSIS — M6281 Muscle weakness (generalized): Secondary | ICD-10-CM | POA: Diagnosis not present

## 2015-10-26 DIAGNOSIS — R41841 Cognitive communication deficit: Secondary | ICD-10-CM | POA: Diagnosis not present

## 2015-10-27 DIAGNOSIS — R2681 Unsteadiness on feet: Secondary | ICD-10-CM | POA: Diagnosis not present

## 2015-10-27 DIAGNOSIS — I4891 Unspecified atrial fibrillation: Secondary | ICD-10-CM | POA: Diagnosis not present

## 2015-10-27 DIAGNOSIS — D291 Benign neoplasm of prostate: Secondary | ICD-10-CM | POA: Diagnosis not present

## 2015-10-27 DIAGNOSIS — K5090 Crohn's disease, unspecified, without complications: Secondary | ICD-10-CM | POA: Diagnosis not present

## 2015-10-27 DIAGNOSIS — I10 Essential (primary) hypertension: Secondary | ICD-10-CM | POA: Diagnosis not present

## 2015-10-27 DIAGNOSIS — M1040 Other secondary gout, unspecified site: Secondary | ICD-10-CM | POA: Diagnosis not present

## 2015-10-27 DIAGNOSIS — R41841 Cognitive communication deficit: Secondary | ICD-10-CM | POA: Diagnosis not present

## 2015-10-27 DIAGNOSIS — M150 Primary generalized (osteo)arthritis: Secondary | ICD-10-CM | POA: Diagnosis not present

## 2015-10-27 DIAGNOSIS — D649 Anemia, unspecified: Secondary | ICD-10-CM | POA: Diagnosis not present

## 2015-10-27 DIAGNOSIS — I2583 Coronary atherosclerosis due to lipid rich plaque: Secondary | ICD-10-CM | POA: Diagnosis not present

## 2015-10-27 DIAGNOSIS — M6281 Muscle weakness (generalized): Secondary | ICD-10-CM | POA: Diagnosis not present

## 2015-10-27 DIAGNOSIS — F0390 Unspecified dementia without behavioral disturbance: Secondary | ICD-10-CM | POA: Diagnosis not present

## 2015-10-30 DIAGNOSIS — M150 Primary generalized (osteo)arthritis: Secondary | ICD-10-CM | POA: Diagnosis not present

## 2015-10-30 DIAGNOSIS — R41841 Cognitive communication deficit: Secondary | ICD-10-CM | POA: Diagnosis not present

## 2015-10-30 DIAGNOSIS — R2681 Unsteadiness on feet: Secondary | ICD-10-CM | POA: Diagnosis not present

## 2015-10-30 DIAGNOSIS — M6281 Muscle weakness (generalized): Secondary | ICD-10-CM | POA: Diagnosis not present

## 2015-10-31 DIAGNOSIS — M6281 Muscle weakness (generalized): Secondary | ICD-10-CM | POA: Diagnosis not present

## 2015-10-31 DIAGNOSIS — M150 Primary generalized (osteo)arthritis: Secondary | ICD-10-CM | POA: Diagnosis not present

## 2015-10-31 DIAGNOSIS — R41841 Cognitive communication deficit: Secondary | ICD-10-CM | POA: Diagnosis not present

## 2015-10-31 DIAGNOSIS — R2681 Unsteadiness on feet: Secondary | ICD-10-CM | POA: Diagnosis not present

## 2015-11-01 DIAGNOSIS — R41841 Cognitive communication deficit: Secondary | ICD-10-CM | POA: Diagnosis not present

## 2015-11-01 DIAGNOSIS — R2681 Unsteadiness on feet: Secondary | ICD-10-CM | POA: Diagnosis not present

## 2015-11-01 DIAGNOSIS — M150 Primary generalized (osteo)arthritis: Secondary | ICD-10-CM | POA: Diagnosis not present

## 2015-11-01 DIAGNOSIS — M6281 Muscle weakness (generalized): Secondary | ICD-10-CM | POA: Diagnosis not present

## 2015-11-02 DIAGNOSIS — M150 Primary generalized (osteo)arthritis: Secondary | ICD-10-CM | POA: Diagnosis not present

## 2015-11-02 DIAGNOSIS — M6281 Muscle weakness (generalized): Secondary | ICD-10-CM | POA: Diagnosis not present

## 2015-11-02 DIAGNOSIS — R1311 Dysphagia, oral phase: Secondary | ICD-10-CM | POA: Diagnosis not present

## 2015-11-08 ENCOUNTER — Encounter: Payer: Self-pay | Admitting: Podiatry

## 2015-11-08 ENCOUNTER — Ambulatory Visit (INDEPENDENT_AMBULATORY_CARE_PROVIDER_SITE_OTHER): Payer: Medicare Other | Admitting: Podiatry

## 2015-11-08 DIAGNOSIS — M79676 Pain in unspecified toe(s): Secondary | ICD-10-CM

## 2015-11-08 DIAGNOSIS — B351 Tinea unguium: Secondary | ICD-10-CM

## 2015-11-08 NOTE — Progress Notes (Signed)
Patient ID: Edward Harvey, male   DOB: 1915/09/21, 82 y.o.   MRN: [id number]  Subjective: This patient presents today for scheduled visit with his son present in the treatment room who is requesting debridement of his father's toenails which are elongated and are comfortable with direct palpation and shoe wearing  Objective: Patient has difficulty responding to direct questioning, however, does decrease seem to respond in a delayed manner Patient was able to transfer wheelchair to treatment chair The toenails are elongated, hypertrophic, deformed, discolored and tender to direct palpation 6-10  Assessment: Symptomatic onychomycoses 6-10  Plan: Debridement toenails 6-10 mechanically and electronically without any bleeding  Reappoint 3 months

## 2015-11-08 NOTE — Patient Instructions (Signed)
Return every 3 months for debridement of mycotic toenails in the right and left feet Gean Birchwood DPM [facility]

## 2015-11-13 DIAGNOSIS — M6281 Muscle weakness (generalized): Secondary | ICD-10-CM | POA: Diagnosis not present

## 2015-11-13 DIAGNOSIS — M150 Primary generalized (osteo)arthritis: Secondary | ICD-10-CM | POA: Diagnosis not present

## 2015-11-13 DIAGNOSIS — R1311 Dysphagia, oral phase: Secondary | ICD-10-CM | POA: Diagnosis not present

## 2015-11-14 DIAGNOSIS — M150 Primary generalized (osteo)arthritis: Secondary | ICD-10-CM | POA: Diagnosis not present

## 2015-11-14 DIAGNOSIS — M6281 Muscle weakness (generalized): Secondary | ICD-10-CM | POA: Diagnosis not present

## 2015-11-14 DIAGNOSIS — R1311 Dysphagia, oral phase: Secondary | ICD-10-CM | POA: Diagnosis not present

## 2015-11-15 DIAGNOSIS — M6281 Muscle weakness (generalized): Secondary | ICD-10-CM | POA: Diagnosis not present

## 2015-11-15 DIAGNOSIS — M150 Primary generalized (osteo)arthritis: Secondary | ICD-10-CM | POA: Diagnosis not present

## 2015-11-15 DIAGNOSIS — R1311 Dysphagia, oral phase: Secondary | ICD-10-CM | POA: Diagnosis not present

## 2015-11-16 DIAGNOSIS — M150 Primary generalized (osteo)arthritis: Secondary | ICD-10-CM | POA: Diagnosis not present

## 2015-11-16 DIAGNOSIS — M6281 Muscle weakness (generalized): Secondary | ICD-10-CM | POA: Diagnosis not present

## 2015-11-16 DIAGNOSIS — R1311 Dysphagia, oral phase: Secondary | ICD-10-CM | POA: Diagnosis not present

## 2015-11-17 DIAGNOSIS — R1311 Dysphagia, oral phase: Secondary | ICD-10-CM | POA: Diagnosis not present

## 2015-11-17 DIAGNOSIS — M150 Primary generalized (osteo)arthritis: Secondary | ICD-10-CM | POA: Diagnosis not present

## 2015-11-17 DIAGNOSIS — M6281 Muscle weakness (generalized): Secondary | ICD-10-CM | POA: Diagnosis not present

## 2015-11-20 DIAGNOSIS — M150 Primary generalized (osteo)arthritis: Secondary | ICD-10-CM | POA: Diagnosis not present

## 2015-11-20 DIAGNOSIS — M6281 Muscle weakness (generalized): Secondary | ICD-10-CM | POA: Diagnosis not present

## 2015-11-20 DIAGNOSIS — R1311 Dysphagia, oral phase: Secondary | ICD-10-CM | POA: Diagnosis not present

## 2015-11-21 DIAGNOSIS — M6281 Muscle weakness (generalized): Secondary | ICD-10-CM | POA: Diagnosis not present

## 2015-11-21 DIAGNOSIS — R1311 Dysphagia, oral phase: Secondary | ICD-10-CM | POA: Diagnosis not present

## 2015-11-21 DIAGNOSIS — M150 Primary generalized (osteo)arthritis: Secondary | ICD-10-CM | POA: Diagnosis not present

## 2015-11-22 DIAGNOSIS — M150 Primary generalized (osteo)arthritis: Secondary | ICD-10-CM | POA: Diagnosis not present

## 2015-11-22 DIAGNOSIS — R1311 Dysphagia, oral phase: Secondary | ICD-10-CM | POA: Diagnosis not present

## 2015-11-22 DIAGNOSIS — M6281 Muscle weakness (generalized): Secondary | ICD-10-CM | POA: Diagnosis not present

## 2015-11-23 DIAGNOSIS — M6281 Muscle weakness (generalized): Secondary | ICD-10-CM | POA: Diagnosis not present

## 2015-11-23 DIAGNOSIS — M150 Primary generalized (osteo)arthritis: Secondary | ICD-10-CM | POA: Diagnosis not present

## 2015-11-23 DIAGNOSIS — R1311 Dysphagia, oral phase: Secondary | ICD-10-CM | POA: Diagnosis not present

## 2015-11-24 DIAGNOSIS — R1311 Dysphagia, oral phase: Secondary | ICD-10-CM | POA: Diagnosis not present

## 2015-11-24 DIAGNOSIS — M150 Primary generalized (osteo)arthritis: Secondary | ICD-10-CM | POA: Diagnosis not present

## 2015-11-24 DIAGNOSIS — M6281 Muscle weakness (generalized): Secondary | ICD-10-CM | POA: Diagnosis not present

## 2015-11-27 DIAGNOSIS — R1311 Dysphagia, oral phase: Secondary | ICD-10-CM | POA: Diagnosis not present

## 2015-11-27 DIAGNOSIS — M150 Primary generalized (osteo)arthritis: Secondary | ICD-10-CM | POA: Diagnosis not present

## 2015-11-27 DIAGNOSIS — M6281 Muscle weakness (generalized): Secondary | ICD-10-CM | POA: Diagnosis not present

## 2015-11-28 DIAGNOSIS — R1311 Dysphagia, oral phase: Secondary | ICD-10-CM | POA: Diagnosis not present

## 2015-11-28 DIAGNOSIS — M6281 Muscle weakness (generalized): Secondary | ICD-10-CM | POA: Diagnosis not present

## 2015-11-28 DIAGNOSIS — M150 Primary generalized (osteo)arthritis: Secondary | ICD-10-CM | POA: Diagnosis not present

## 2015-11-29 DIAGNOSIS — M150 Primary generalized (osteo)arthritis: Secondary | ICD-10-CM | POA: Diagnosis not present

## 2015-11-29 DIAGNOSIS — R1311 Dysphagia, oral phase: Secondary | ICD-10-CM | POA: Diagnosis not present

## 2015-11-29 DIAGNOSIS — M6281 Muscle weakness (generalized): Secondary | ICD-10-CM | POA: Diagnosis not present

## 2015-11-30 DIAGNOSIS — R1311 Dysphagia, oral phase: Secondary | ICD-10-CM | POA: Diagnosis not present

## 2015-11-30 DIAGNOSIS — M150 Primary generalized (osteo)arthritis: Secondary | ICD-10-CM | POA: Diagnosis not present

## 2015-11-30 DIAGNOSIS — M6281 Muscle weakness (generalized): Secondary | ICD-10-CM | POA: Diagnosis not present

## 2015-12-01 DIAGNOSIS — M150 Primary generalized (osteo)arthritis: Secondary | ICD-10-CM | POA: Diagnosis not present

## 2015-12-01 DIAGNOSIS — R1311 Dysphagia, oral phase: Secondary | ICD-10-CM | POA: Diagnosis not present

## 2015-12-01 DIAGNOSIS — M6281 Muscle weakness (generalized): Secondary | ICD-10-CM | POA: Diagnosis not present

## 2015-12-04 DIAGNOSIS — R1311 Dysphagia, oral phase: Secondary | ICD-10-CM | POA: Diagnosis not present

## 2015-12-05 DIAGNOSIS — R1311 Dysphagia, oral phase: Secondary | ICD-10-CM | POA: Diagnosis not present

## 2015-12-06 DIAGNOSIS — R1311 Dysphagia, oral phase: Secondary | ICD-10-CM | POA: Diagnosis not present

## 2015-12-07 DIAGNOSIS — R1311 Dysphagia, oral phase: Secondary | ICD-10-CM | POA: Diagnosis not present

## 2015-12-08 DIAGNOSIS — R1311 Dysphagia, oral phase: Secondary | ICD-10-CM | POA: Diagnosis not present

## 2015-12-11 DIAGNOSIS — R1311 Dysphagia, oral phase: Secondary | ICD-10-CM | POA: Diagnosis not present

## 2015-12-12 DIAGNOSIS — D649 Anemia, unspecified: Secondary | ICD-10-CM | POA: Diagnosis not present

## 2015-12-12 DIAGNOSIS — R1311 Dysphagia, oral phase: Secondary | ICD-10-CM | POA: Diagnosis not present

## 2015-12-13 DIAGNOSIS — R1311 Dysphagia, oral phase: Secondary | ICD-10-CM | POA: Diagnosis not present

## 2015-12-14 DIAGNOSIS — R1311 Dysphagia, oral phase: Secondary | ICD-10-CM | POA: Diagnosis not present

## 2015-12-15 ENCOUNTER — Telehealth: Payer: Self-pay | Admitting: Internal Medicine

## 2015-12-15 NOTE — Telephone Encounter (Signed)
Pyrtle pt with history of crohn's. Pt had diarrhea and was placed on clear liquids Wed. Thursday pt ate and had diarrhea again, lomotil die not help. Today he is back on clear liquids. Pt taking pentasa 500mg  2 pills tid. Pt is at nursing home and they would like to try and keep him out of the hospital. State pts color is not good and he does not feel good. Please advise as doc of the day.

## 2015-12-15 NOTE — Telephone Encounter (Signed)
Spoke with Edward Harvey and she is aware.

## 2015-12-15 NOTE — Telephone Encounter (Signed)
Left message to call back  

## 2015-12-15 NOTE — Telephone Encounter (Signed)
Check C.diff stat and stool culture. Please send Rx flagyl 500mg  TID empirically x7 days. Try to maintain hydration with fluid intake

## 2015-12-18 DIAGNOSIS — R1311 Dysphagia, oral phase: Secondary | ICD-10-CM | POA: Diagnosis not present

## 2015-12-19 DIAGNOSIS — R1311 Dysphagia, oral phase: Secondary | ICD-10-CM | POA: Diagnosis not present

## 2015-12-21 DIAGNOSIS — R1311 Dysphagia, oral phase: Secondary | ICD-10-CM | POA: Diagnosis not present

## 2015-12-22 DIAGNOSIS — R1311 Dysphagia, oral phase: Secondary | ICD-10-CM | POA: Diagnosis not present

## 2015-12-25 DIAGNOSIS — R1311 Dysphagia, oral phase: Secondary | ICD-10-CM | POA: Diagnosis not present

## 2015-12-25 DIAGNOSIS — R197 Diarrhea, unspecified: Secondary | ICD-10-CM | POA: Diagnosis not present

## 2015-12-26 DIAGNOSIS — R1311 Dysphagia, oral phase: Secondary | ICD-10-CM | POA: Diagnosis not present

## 2015-12-27 DIAGNOSIS — R1311 Dysphagia, oral phase: Secondary | ICD-10-CM | POA: Diagnosis not present

## 2015-12-28 DIAGNOSIS — R1311 Dysphagia, oral phase: Secondary | ICD-10-CM | POA: Diagnosis not present

## 2015-12-29 DIAGNOSIS — R1311 Dysphagia, oral phase: Secondary | ICD-10-CM | POA: Diagnosis not present

## 2016-01-01 DIAGNOSIS — R1311 Dysphagia, oral phase: Secondary | ICD-10-CM | POA: Diagnosis not present

## 2016-01-04 DIAGNOSIS — K5660 Unspecified intestinal obstruction: Secondary | ICD-10-CM | POA: Diagnosis not present

## 2016-01-04 DIAGNOSIS — N400 Enlarged prostate without lower urinary tract symptoms: Secondary | ICD-10-CM | POA: Diagnosis not present

## 2016-01-04 DIAGNOSIS — F0390 Unspecified dementia without behavioral disturbance: Secondary | ICD-10-CM | POA: Diagnosis not present

## 2016-01-04 DIAGNOSIS — M109 Gout, unspecified: Secondary | ICD-10-CM | POA: Diagnosis not present

## 2016-01-04 DIAGNOSIS — I4891 Unspecified atrial fibrillation: Secondary | ICD-10-CM | POA: Diagnosis not present

## 2016-01-04 DIAGNOSIS — I2583 Coronary atherosclerosis due to lipid rich plaque: Secondary | ICD-10-CM | POA: Diagnosis not present

## 2016-01-04 DIAGNOSIS — K5090 Crohn's disease, unspecified, without complications: Secondary | ICD-10-CM | POA: Diagnosis not present

## 2016-01-04 DIAGNOSIS — I10 Essential (primary) hypertension: Secondary | ICD-10-CM | POA: Diagnosis not present

## 2016-01-15 DIAGNOSIS — H401134 Primary open-angle glaucoma, bilateral, indeterminate stage: Secondary | ICD-10-CM | POA: Diagnosis not present

## 2016-01-15 DIAGNOSIS — Z961 Presence of intraocular lens: Secondary | ICD-10-CM | POA: Diagnosis not present

## 2016-02-14 ENCOUNTER — Ambulatory Visit: Payer: Medicare Other | Admitting: Podiatry

## 2016-03-05 DIAGNOSIS — M150 Primary generalized (osteo)arthritis: Secondary | ICD-10-CM | POA: Diagnosis not present

## 2016-03-05 DIAGNOSIS — M6281 Muscle weakness (generalized): Secondary | ICD-10-CM | POA: Diagnosis not present

## 2016-03-06 ENCOUNTER — Encounter: Payer: Self-pay | Admitting: Podiatry

## 2016-03-06 ENCOUNTER — Ambulatory Visit (INDEPENDENT_AMBULATORY_CARE_PROVIDER_SITE_OTHER): Payer: Medicare Other | Admitting: Podiatry

## 2016-03-06 DIAGNOSIS — B351 Tinea unguium: Secondary | ICD-10-CM | POA: Diagnosis not present

## 2016-03-06 DIAGNOSIS — M79676 Pain in unspecified toe(s): Secondary | ICD-10-CM

## 2016-03-06 DIAGNOSIS — M6281 Muscle weakness (generalized): Secondary | ICD-10-CM | POA: Diagnosis not present

## 2016-03-06 DIAGNOSIS — M150 Primary generalized (osteo)arthritis: Secondary | ICD-10-CM | POA: Diagnosis not present

## 2016-03-06 NOTE — Patient Instructions (Signed)
Return every 3 months for debridement of mycotic toenails Gean Birchwood DPM [facility] 03/06/2016

## 2016-03-07 DIAGNOSIS — M150 Primary generalized (osteo)arthritis: Secondary | ICD-10-CM | POA: Diagnosis not present

## 2016-03-07 DIAGNOSIS — M6281 Muscle weakness (generalized): Secondary | ICD-10-CM | POA: Diagnosis not present

## 2016-03-07 NOTE — Progress Notes (Signed)
Patient ID: Edward Harvey, male   DOB: 11/03/1915, 82 y.o.   MRN: [id number]    Subjective: This patient presents today for scheduled visit with his son present in the treatment room who is requesting debridement of his father's toenails which are elongated and are comfortable with direct palpation and shoe wearing  Objective: Patient has difficulty responding to direct questioning, however, does decrease seem to respond in a delayed manner Patient was able to transfer wheelchair to treatment chair No open lesions bilaterally DP and PT pulses 1/4 bilaterally Capillary reflex immediate bilaterally HAV left Hammertoe 2-5 bilaterally The toenails are elongated, hypertrophic, deformed, discolored and tender to direct palpation 6-10  Assessment: Symptomatic onychomycoses 6-10  Plan: Debridement toenails 6-10 mechanically and electronically without any bleeding  Reappoint 3 months

## 2016-03-08 DIAGNOSIS — M6281 Muscle weakness (generalized): Secondary | ICD-10-CM | POA: Diagnosis not present

## 2016-03-08 DIAGNOSIS — M150 Primary generalized (osteo)arthritis: Secondary | ICD-10-CM | POA: Diagnosis not present

## 2016-03-11 DIAGNOSIS — M6281 Muscle weakness (generalized): Secondary | ICD-10-CM | POA: Diagnosis not present

## 2016-03-11 DIAGNOSIS — M150 Primary generalized (osteo)arthritis: Secondary | ICD-10-CM | POA: Diagnosis not present

## 2016-03-12 DIAGNOSIS — M6281 Muscle weakness (generalized): Secondary | ICD-10-CM | POA: Diagnosis not present

## 2016-03-12 DIAGNOSIS — M150 Primary generalized (osteo)arthritis: Secondary | ICD-10-CM | POA: Diagnosis not present

## 2016-03-13 DIAGNOSIS — M6281 Muscle weakness (generalized): Secondary | ICD-10-CM | POA: Diagnosis not present

## 2016-03-13 DIAGNOSIS — M150 Primary generalized (osteo)arthritis: Secondary | ICD-10-CM | POA: Diagnosis not present

## 2016-03-14 DIAGNOSIS — M6281 Muscle weakness (generalized): Secondary | ICD-10-CM | POA: Diagnosis not present

## 2016-03-14 DIAGNOSIS — M150 Primary generalized (osteo)arthritis: Secondary | ICD-10-CM | POA: Diagnosis not present

## 2016-03-15 DIAGNOSIS — M150 Primary generalized (osteo)arthritis: Secondary | ICD-10-CM | POA: Diagnosis not present

## 2016-03-15 DIAGNOSIS — M6281 Muscle weakness (generalized): Secondary | ICD-10-CM | POA: Diagnosis not present

## 2016-03-18 DIAGNOSIS — M150 Primary generalized (osteo)arthritis: Secondary | ICD-10-CM | POA: Diagnosis not present

## 2016-03-18 DIAGNOSIS — M6281 Muscle weakness (generalized): Secondary | ICD-10-CM | POA: Diagnosis not present

## 2016-03-19 DIAGNOSIS — M6281 Muscle weakness (generalized): Secondary | ICD-10-CM | POA: Diagnosis not present

## 2016-03-19 DIAGNOSIS — M150 Primary generalized (osteo)arthritis: Secondary | ICD-10-CM | POA: Diagnosis not present

## 2016-03-20 DIAGNOSIS — M150 Primary generalized (osteo)arthritis: Secondary | ICD-10-CM | POA: Diagnosis not present

## 2016-03-20 DIAGNOSIS — M6281 Muscle weakness (generalized): Secondary | ICD-10-CM | POA: Diagnosis not present

## 2016-03-20 DIAGNOSIS — I10 Essential (primary) hypertension: Secondary | ICD-10-CM | POA: Diagnosis not present

## 2016-03-20 DIAGNOSIS — K5090 Crohn's disease, unspecified, without complications: Secondary | ICD-10-CM | POA: Diagnosis not present

## 2016-03-20 DIAGNOSIS — W19XXXD Unspecified fall, subsequent encounter: Secondary | ICD-10-CM | POA: Diagnosis not present

## 2016-03-20 DIAGNOSIS — I482 Chronic atrial fibrillation: Secondary | ICD-10-CM | POA: Diagnosis not present

## 2016-03-21 DIAGNOSIS — N179 Acute kidney failure, unspecified: Secondary | ICD-10-CM | POA: Diagnosis not present

## 2016-03-21 DIAGNOSIS — N401 Enlarged prostate with lower urinary tract symptoms: Secondary | ICD-10-CM | POA: Diagnosis not present

## 2016-03-21 DIAGNOSIS — F0390 Unspecified dementia without behavioral disturbance: Secondary | ICD-10-CM | POA: Diagnosis not present

## 2016-03-21 DIAGNOSIS — M150 Primary generalized (osteo)arthritis: Secondary | ICD-10-CM | POA: Diagnosis not present

## 2016-03-21 DIAGNOSIS — I480 Paroxysmal atrial fibrillation: Secondary | ICD-10-CM | POA: Diagnosis not present

## 2016-03-21 DIAGNOSIS — I2510 Atherosclerotic heart disease of native coronary artery without angina pectoris: Secondary | ICD-10-CM | POA: Diagnosis not present

## 2016-03-21 DIAGNOSIS — M109 Gout, unspecified: Secondary | ICD-10-CM | POA: Diagnosis not present

## 2016-03-21 DIAGNOSIS — M6281 Muscle weakness (generalized): Secondary | ICD-10-CM | POA: Diagnosis not present

## 2016-03-21 DIAGNOSIS — I10 Essential (primary) hypertension: Secondary | ICD-10-CM | POA: Diagnosis not present

## 2016-03-21 DIAGNOSIS — K5090 Crohn's disease, unspecified, without complications: Secondary | ICD-10-CM | POA: Diagnosis not present

## 2016-03-22 DIAGNOSIS — M150 Primary generalized (osteo)arthritis: Secondary | ICD-10-CM | POA: Diagnosis not present

## 2016-03-22 DIAGNOSIS — M6281 Muscle weakness (generalized): Secondary | ICD-10-CM | POA: Diagnosis not present

## 2016-03-25 DIAGNOSIS — M150 Primary generalized (osteo)arthritis: Secondary | ICD-10-CM | POA: Diagnosis not present

## 2016-03-25 DIAGNOSIS — M6281 Muscle weakness (generalized): Secondary | ICD-10-CM | POA: Diagnosis not present

## 2016-03-26 DIAGNOSIS — M6281 Muscle weakness (generalized): Secondary | ICD-10-CM | POA: Diagnosis not present

## 2016-03-26 DIAGNOSIS — M150 Primary generalized (osteo)arthritis: Secondary | ICD-10-CM | POA: Diagnosis not present

## 2016-03-27 DIAGNOSIS — M6281 Muscle weakness (generalized): Secondary | ICD-10-CM | POA: Diagnosis not present

## 2016-03-27 DIAGNOSIS — M150 Primary generalized (osteo)arthritis: Secondary | ICD-10-CM | POA: Diagnosis not present

## 2016-03-28 DIAGNOSIS — M6281 Muscle weakness (generalized): Secondary | ICD-10-CM | POA: Diagnosis not present

## 2016-03-28 DIAGNOSIS — M150 Primary generalized (osteo)arthritis: Secondary | ICD-10-CM | POA: Diagnosis not present

## 2016-03-29 DIAGNOSIS — K5090 Crohn's disease, unspecified, without complications: Secondary | ICD-10-CM | POA: Diagnosis not present

## 2016-03-29 DIAGNOSIS — I10 Essential (primary) hypertension: Secondary | ICD-10-CM | POA: Diagnosis not present

## 2016-03-29 DIAGNOSIS — M150 Primary generalized (osteo)arthritis: Secondary | ICD-10-CM | POA: Diagnosis not present

## 2016-03-29 DIAGNOSIS — M6281 Muscle weakness (generalized): Secondary | ICD-10-CM | POA: Diagnosis not present

## 2016-03-29 DIAGNOSIS — R609 Edema, unspecified: Secondary | ICD-10-CM | POA: Diagnosis not present

## 2016-03-29 DIAGNOSIS — I482 Chronic atrial fibrillation: Secondary | ICD-10-CM | POA: Diagnosis not present

## 2016-04-01 DIAGNOSIS — M150 Primary generalized (osteo)arthritis: Secondary | ICD-10-CM | POA: Diagnosis not present

## 2016-04-01 DIAGNOSIS — M6281 Muscle weakness (generalized): Secondary | ICD-10-CM | POA: Diagnosis not present

## 2016-04-02 ENCOUNTER — Ambulatory Visit (INDEPENDENT_AMBULATORY_CARE_PROVIDER_SITE_OTHER): Payer: Medicare Other | Admitting: Internal Medicine

## 2016-04-02 ENCOUNTER — Encounter: Payer: Self-pay | Admitting: Internal Medicine

## 2016-04-02 VITALS — BP 104/72 | HR 68

## 2016-04-02 DIAGNOSIS — K5080 Crohn's disease of both small and large intestine without complications: Secondary | ICD-10-CM

## 2016-04-02 DIAGNOSIS — M150 Primary generalized (osteo)arthritis: Secondary | ICD-10-CM | POA: Diagnosis not present

## 2016-04-02 DIAGNOSIS — M6281 Muscle weakness (generalized): Secondary | ICD-10-CM | POA: Diagnosis not present

## 2016-04-02 NOTE — Progress Notes (Signed)
   Subjective:    Patient ID: Edward Harvey, male    DOB: 1915/10/01, 82 y.o.   MRN: [id number]  HPI Edward Harvey is a 82 year old male with history of Crohn's ileocolitis, colonic diverticulosis status post segmental colonic resection for diverticular bleeding, history of C. difficile who is here for follow-up. He is here with his son today. His son reports that he is doing well. He has not had any recurrent diarrhea. Reportedly appetite has been very good. No reports of nausea, abdominal pain, vomiting, blood in stool or melena. He is using Pentasa 1 g 3 times a day but previously was on Lialda 4.8 g daily. He has a prescription for Lomotil which she uses as needed. He continues on Florastor 250 twice daily.  Review of Systems As per history of present illness, otherwise negative  Current Medications, Allergies, Past Medical History, Past Surgical History, Family History and Social History were reviewed in [employer] record.     Objective:   Physical Exam BP 104/72 (BP Location: Left Arm, Patient Position: Sitting, Cuff Size: Normal)   Pulse 68  Constitutional: Well-developed Elderly appearing male in wheelchair in no distress. HEENT: Normocephalic and atraumatic. Oropharynx is clear and moist. No oropharyngeal exudate. Conjunctivae are normal.  No scleral icterus. Neck: Neck supple. Trachea midline. Cardiovascular: Normal rate, regular rhythm and intact distal pulses. Pacemaker right upper chest [place] Pulmonary/chest: Effort normal and breath sounds normal. No wheezing, rales or rhonchi. Abdominal: Soft, nontender, nondistended. Bowel sounds active throughout.  Extremities: no clubbing, cyanosis, or edema Neurological: Alert and oriented to person and place, unaware of the date. Hard of hearing Skin: Skin is warm and dry.P Psychiatric: Normal mood and affect. Behavior is normal.      Assessment & Plan:  82 year old male with history of Crohn's ileocolitis,  colonic diverticulosis status post segmental colonic resection for diverticular bleeding, history of C. difficile who is here for follow-up.  1. Crohn's ileocolitis -- history of with no evidence for active disease at present. Per report, history somewhat limited due to patient's relative inability to participate in questioning today, he is not having diarrhea or abdominal pain. Appetite remains strong/normal. I expect mesalamine was switched to Pentasa as this is better covered.  I recommended he continue 1 g 3 times a day up Pentasa and Florastor to 50 mg twice daily. Lomotil can be used as needed   6 month follow-up as the son reports both he and his dad feel better if he sees [place] periodically.  Return sooner if needed  15 minutes spent with the patient today. Greater than 50% was spent in counseling and coordination of care with the patient

## 2016-04-02 NOTE — Patient Instructions (Addendum)
Continue Pentasa and Florastor at current doses.  Please follow up with Dr Hilarie Fredrickson in 6-9 months.  CC: [facility], Dr Reynold Bowen

## 2016-04-03 DIAGNOSIS — M6281 Muscle weakness (generalized): Secondary | ICD-10-CM | POA: Diagnosis not present

## 2016-04-03 DIAGNOSIS — M150 Primary generalized (osteo)arthritis: Secondary | ICD-10-CM | POA: Diagnosis not present

## 2016-04-04 DIAGNOSIS — E039 Hypothyroidism, unspecified: Secondary | ICD-10-CM | POA: Diagnosis not present

## 2016-04-04 DIAGNOSIS — M6281 Muscle weakness (generalized): Secondary | ICD-10-CM | POA: Diagnosis not present

## 2016-04-04 DIAGNOSIS — E785 Hyperlipidemia, unspecified: Secondary | ICD-10-CM | POA: Diagnosis not present

## 2016-04-04 DIAGNOSIS — Z79899 Other long term (current) drug therapy: Secondary | ICD-10-CM | POA: Diagnosis not present

## 2016-04-04 DIAGNOSIS — D649 Anemia, unspecified: Secondary | ICD-10-CM | POA: Diagnosis not present

## 2016-04-04 DIAGNOSIS — E559 Vitamin D deficiency, unspecified: Secondary | ICD-10-CM | POA: Diagnosis not present

## 2016-04-04 DIAGNOSIS — I10 Essential (primary) hypertension: Secondary | ICD-10-CM | POA: Diagnosis not present

## 2016-04-04 DIAGNOSIS — M150 Primary generalized (osteo)arthritis: Secondary | ICD-10-CM | POA: Diagnosis not present

## 2016-04-05 DIAGNOSIS — M6281 Muscle weakness (generalized): Secondary | ICD-10-CM | POA: Diagnosis not present

## 2016-04-05 DIAGNOSIS — M150 Primary generalized (osteo)arthritis: Secondary | ICD-10-CM | POA: Diagnosis not present

## 2016-05-01 DIAGNOSIS — H401134 Primary open-angle glaucoma, bilateral, indeterminate stage: Secondary | ICD-10-CM | POA: Diagnosis not present

## 2016-05-02 DIAGNOSIS — K5090 Crohn's disease, unspecified, without complications: Secondary | ICD-10-CM | POA: Diagnosis not present

## 2016-05-02 DIAGNOSIS — I482 Chronic atrial fibrillation: Secondary | ICD-10-CM | POA: Diagnosis not present

## 2016-05-02 DIAGNOSIS — R197 Diarrhea, unspecified: Secondary | ICD-10-CM | POA: Diagnosis not present

## 2016-05-02 DIAGNOSIS — F0390 Unspecified dementia without behavioral disturbance: Secondary | ICD-10-CM | POA: Diagnosis not present

## 2016-05-09 ENCOUNTER — Encounter: Payer: Self-pay | Admitting: Nurse Practitioner

## 2016-05-09 ENCOUNTER — Ambulatory Visit (INDEPENDENT_AMBULATORY_CARE_PROVIDER_SITE_OTHER): Payer: Medicare Other | Admitting: Nurse Practitioner

## 2016-05-09 VITALS — BP 100/60 | HR 60

## 2016-05-09 DIAGNOSIS — K5080 Crohn's disease of both small and large intestine without complications: Secondary | ICD-10-CM

## 2016-05-09 DIAGNOSIS — R197 Diarrhea, unspecified: Secondary | ICD-10-CM | POA: Diagnosis not present

## 2016-05-09 NOTE — Progress Notes (Addendum)
     HPI: Patient is a 82 year old male known to Dr. Hilarie Fredrickson for a history of Crohn's ileocolitis maintained on Pentasa. He is status post colon resection for diverticular bleeding. He also has a history of C. Difficile. His last colonoscopy was in 2009 with findings of ulceration and inflammation of small bowel at ileocolonic anastomosis. Biopsies revealed chronic active inflammation with focal ulceration in small bowel and colonic mucosa.   [facility] has requested patient be evaluated for diarrhea.  He is accompanied by his son who helps with the history. Patient wa seen here 04/02/16 at which time he was doing well from a Crohn's standpoint. He was advised to continue florastor and Pentasa 1 g 3 times a day.. Patient unable to provide much history. He is extremtly hard of hearing and has short term memory loss per son. [facility] staff reports diarrhea despite scheduled Lomotil. Not able to advance from clear liquids due to diarrhea per staff. Son believes diarrhea started after patient collard greens at his home on Thanksgiving. Son believes diarrhea has been better over last severaal days.   Past Medical History:  Diagnosis Date  . A-fib ([place])   . Acute bronchitis   . Acute renal failure (ARF) ([place])   . Anemia   . Antral ulcer 2011  . Atrioventricular block, complete ([place])   . C. difficile colitis   . CAD (coronary artery disease)    nuclear stress test 2006 inferoseptal and apical infarct ejection fraction 48%  . Crohn disease ([place])   . Diverticulosis of colon with hemorrhage   . DJD (degenerative joint disease)   . Gallstone   . Glaucoma   . Gout   . Hematoma    subdural  . Hemorrhage of gastrointestinal tract, unspecified   . Hyperkalemia   . Hypertension   . Inguinal hernia   . Internal hemorrhoids without mention of complication   . Mild dementia   . Osteoarthritis   . Pacemaker   . Prostate cancer [facility])    radiation therapy in 1996/notes 10/29/2001 (05/28/2013)  .  Segmental colitis ([place])   . Traumatic subdural hematoma ([facility])   . Unspecified intestinal obstruction     Patient's surgical history, family medical history, social history, medications and allergies were all reviewed in Epic    Physical Exam: BP 100/60   Pulse 60   GENERAL: Alert, well developed black male in wheelchair in NAD PSYCH: :Pleasant, cooperative, normal affect HEENT: Normocephalic, conjunctiva pink, mucous membranes moist, neck supple without masses. Very HOH. CARDIAC:  RRR, no murmur heard, no peripheral edema PULM: Normal respiratory effort, lungs CTA bilaterally, no wheezing ABDOMEN:  Limited exam as patient in wheelchair. Abdomen is soft, nontender, nondistended, hyperactive  bowel sounds   ASSESSMENT and PLAN:  82 yo male with longstanding Crohn's ileocolitis maintained on florastor and pentasa. He has periodic loose stools, has standing prn lomotil order at [facility]. Per [facility] staff, patient having diarrhea despite lomotil and unable to advance from clear liquids because of it.  -he needs to be checked for c-diff given multiple risk factors. Patient unable to give stool sample right now. Please call us with c-diff results. If negative then will add Loperamide. Would really like to avoid colonoscopy / flex sig in patient given advanced age.  -Please obtain a CBC and CRP and fax results to [place]   Tye Savoy  05/09/2016, 3:02 PM   Addendum: Reviewed and agree with management. Jerene Bears, MD

## 2016-05-20 DIAGNOSIS — N401 Enlarged prostate with lower urinary tract symptoms: Secondary | ICD-10-CM | POA: Diagnosis not present

## 2016-05-20 DIAGNOSIS — I2510 Atherosclerotic heart disease of native coronary artery without angina pectoris: Secondary | ICD-10-CM | POA: Diagnosis not present

## 2016-05-20 DIAGNOSIS — K5000 Crohn's disease of small intestine without complications: Secondary | ICD-10-CM | POA: Diagnosis not present

## 2016-05-20 DIAGNOSIS — I10 Essential (primary) hypertension: Secondary | ICD-10-CM | POA: Diagnosis not present

## 2016-05-20 DIAGNOSIS — M109 Gout, unspecified: Secondary | ICD-10-CM | POA: Diagnosis not present

## 2016-05-20 DIAGNOSIS — F0390 Unspecified dementia without behavioral disturbance: Secondary | ICD-10-CM | POA: Diagnosis not present

## 2016-05-20 DIAGNOSIS — K56609 Unspecified intestinal obstruction, unspecified as to partial versus complete obstruction: Secondary | ICD-10-CM | POA: Diagnosis not present

## 2016-05-20 DIAGNOSIS — I480 Paroxysmal atrial fibrillation: Secondary | ICD-10-CM | POA: Diagnosis not present

## 2016-05-25 DIAGNOSIS — R0989 Other specified symptoms and signs involving the circulatory and respiratory systems: Secondary | ICD-10-CM | POA: Diagnosis not present

## 2016-06-06 DIAGNOSIS — R1311 Dysphagia, oral phase: Secondary | ICD-10-CM | POA: Diagnosis not present

## 2016-06-06 DIAGNOSIS — R41841 Cognitive communication deficit: Secondary | ICD-10-CM | POA: Diagnosis not present

## 2016-06-06 DIAGNOSIS — I4891 Unspecified atrial fibrillation: Secondary | ICD-10-CM | POA: Diagnosis not present

## 2016-06-06 DIAGNOSIS — R2681 Unsteadiness on feet: Secondary | ICD-10-CM | POA: Diagnosis not present

## 2016-06-06 DIAGNOSIS — M6281 Muscle weakness (generalized): Secondary | ICD-10-CM | POA: Diagnosis not present

## 2016-06-06 DIAGNOSIS — R1312 Dysphagia, oropharyngeal phase: Secondary | ICD-10-CM | POA: Diagnosis not present

## 2016-06-07 DIAGNOSIS — M6281 Muscle weakness (generalized): Secondary | ICD-10-CM | POA: Diagnosis not present

## 2016-06-07 DIAGNOSIS — I4891 Unspecified atrial fibrillation: Secondary | ICD-10-CM | POA: Diagnosis not present

## 2016-06-07 DIAGNOSIS — R1311 Dysphagia, oral phase: Secondary | ICD-10-CM | POA: Diagnosis not present

## 2016-06-07 DIAGNOSIS — R2681 Unsteadiness on feet: Secondary | ICD-10-CM | POA: Diagnosis not present

## 2016-06-07 DIAGNOSIS — R1312 Dysphagia, oropharyngeal phase: Secondary | ICD-10-CM | POA: Diagnosis not present

## 2016-06-07 DIAGNOSIS — R41841 Cognitive communication deficit: Secondary | ICD-10-CM | POA: Diagnosis not present

## 2016-06-08 DIAGNOSIS — M6281 Muscle weakness (generalized): Secondary | ICD-10-CM | POA: Diagnosis not present

## 2016-06-08 DIAGNOSIS — R1311 Dysphagia, oral phase: Secondary | ICD-10-CM | POA: Diagnosis not present

## 2016-06-08 DIAGNOSIS — R2681 Unsteadiness on feet: Secondary | ICD-10-CM | POA: Diagnosis not present

## 2016-06-08 DIAGNOSIS — R1312 Dysphagia, oropharyngeal phase: Secondary | ICD-10-CM | POA: Diagnosis not present

## 2016-06-08 DIAGNOSIS — I4891 Unspecified atrial fibrillation: Secondary | ICD-10-CM | POA: Diagnosis not present

## 2016-06-08 DIAGNOSIS — R41841 Cognitive communication deficit: Secondary | ICD-10-CM | POA: Diagnosis not present

## 2016-06-10 DIAGNOSIS — R41841 Cognitive communication deficit: Secondary | ICD-10-CM | POA: Diagnosis not present

## 2016-06-10 DIAGNOSIS — R1312 Dysphagia, oropharyngeal phase: Secondary | ICD-10-CM | POA: Diagnosis not present

## 2016-06-10 DIAGNOSIS — R2681 Unsteadiness on feet: Secondary | ICD-10-CM | POA: Diagnosis not present

## 2016-06-10 DIAGNOSIS — I4891 Unspecified atrial fibrillation: Secondary | ICD-10-CM | POA: Diagnosis not present

## 2016-06-10 DIAGNOSIS — R1311 Dysphagia, oral phase: Secondary | ICD-10-CM | POA: Diagnosis not present

## 2016-06-10 DIAGNOSIS — M6281 Muscle weakness (generalized): Secondary | ICD-10-CM | POA: Diagnosis not present

## 2016-06-12 ENCOUNTER — Ambulatory Visit: Payer: Medicare Other | Admitting: Podiatry

## 2016-06-12 DIAGNOSIS — I4891 Unspecified atrial fibrillation: Secondary | ICD-10-CM | POA: Diagnosis not present

## 2016-06-12 DIAGNOSIS — R1312 Dysphagia, oropharyngeal phase: Secondary | ICD-10-CM | POA: Diagnosis not present

## 2016-06-12 DIAGNOSIS — R2681 Unsteadiness on feet: Secondary | ICD-10-CM | POA: Diagnosis not present

## 2016-06-12 DIAGNOSIS — M6281 Muscle weakness (generalized): Secondary | ICD-10-CM | POA: Diagnosis not present

## 2016-06-12 DIAGNOSIS — R1311 Dysphagia, oral phase: Secondary | ICD-10-CM | POA: Diagnosis not present

## 2016-06-12 DIAGNOSIS — R41841 Cognitive communication deficit: Secondary | ICD-10-CM | POA: Diagnosis not present

## 2016-06-13 DIAGNOSIS — M6281 Muscle weakness (generalized): Secondary | ICD-10-CM | POA: Diagnosis not present

## 2016-06-13 DIAGNOSIS — R1311 Dysphagia, oral phase: Secondary | ICD-10-CM | POA: Diagnosis not present

## 2016-06-13 DIAGNOSIS — I4891 Unspecified atrial fibrillation: Secondary | ICD-10-CM | POA: Diagnosis not present

## 2016-06-13 DIAGNOSIS — R41841 Cognitive communication deficit: Secondary | ICD-10-CM | POA: Diagnosis not present

## 2016-06-13 DIAGNOSIS — R2681 Unsteadiness on feet: Secondary | ICD-10-CM | POA: Diagnosis not present

## 2016-06-13 DIAGNOSIS — R1312 Dysphagia, oropharyngeal phase: Secondary | ICD-10-CM | POA: Diagnosis not present

## 2016-06-14 DIAGNOSIS — R1312 Dysphagia, oropharyngeal phase: Secondary | ICD-10-CM | POA: Diagnosis not present

## 2016-06-14 DIAGNOSIS — R1311 Dysphagia, oral phase: Secondary | ICD-10-CM | POA: Diagnosis not present

## 2016-06-14 DIAGNOSIS — R41841 Cognitive communication deficit: Secondary | ICD-10-CM | POA: Diagnosis not present

## 2016-06-14 DIAGNOSIS — M6281 Muscle weakness (generalized): Secondary | ICD-10-CM | POA: Diagnosis not present

## 2016-06-14 DIAGNOSIS — I4891 Unspecified atrial fibrillation: Secondary | ICD-10-CM | POA: Diagnosis not present

## 2016-06-14 DIAGNOSIS — R2681 Unsteadiness on feet: Secondary | ICD-10-CM | POA: Diagnosis not present

## 2016-06-17 DIAGNOSIS — M6281 Muscle weakness (generalized): Secondary | ICD-10-CM | POA: Diagnosis not present

## 2016-06-17 DIAGNOSIS — R1312 Dysphagia, oropharyngeal phase: Secondary | ICD-10-CM | POA: Diagnosis not present

## 2016-06-17 DIAGNOSIS — R41841 Cognitive communication deficit: Secondary | ICD-10-CM | POA: Diagnosis not present

## 2016-06-17 DIAGNOSIS — R1311 Dysphagia, oral phase: Secondary | ICD-10-CM | POA: Diagnosis not present

## 2016-06-17 DIAGNOSIS — R2681 Unsteadiness on feet: Secondary | ICD-10-CM | POA: Diagnosis not present

## 2016-06-17 DIAGNOSIS — I4891 Unspecified atrial fibrillation: Secondary | ICD-10-CM | POA: Diagnosis not present

## 2016-06-18 DIAGNOSIS — R1312 Dysphagia, oropharyngeal phase: Secondary | ICD-10-CM | POA: Diagnosis not present

## 2016-06-18 DIAGNOSIS — R41841 Cognitive communication deficit: Secondary | ICD-10-CM | POA: Diagnosis not present

## 2016-06-18 DIAGNOSIS — R627 Adult failure to thrive: Secondary | ICD-10-CM | POA: Diagnosis not present

## 2016-06-18 DIAGNOSIS — F0390 Unspecified dementia without behavioral disturbance: Secondary | ICD-10-CM | POA: Diagnosis not present

## 2016-06-18 DIAGNOSIS — I4891 Unspecified atrial fibrillation: Secondary | ICD-10-CM | POA: Diagnosis not present

## 2016-06-18 DIAGNOSIS — K5090 Crohn's disease, unspecified, without complications: Secondary | ICD-10-CM | POA: Diagnosis not present

## 2016-06-18 DIAGNOSIS — R2681 Unsteadiness on feet: Secondary | ICD-10-CM | POA: Diagnosis not present

## 2016-06-18 DIAGNOSIS — M6281 Muscle weakness (generalized): Secondary | ICD-10-CM | POA: Diagnosis not present

## 2016-06-18 DIAGNOSIS — I482 Chronic atrial fibrillation: Secondary | ICD-10-CM | POA: Diagnosis not present

## 2016-06-18 DIAGNOSIS — R1311 Dysphagia, oral phase: Secondary | ICD-10-CM | POA: Diagnosis not present

## 2016-06-19 DIAGNOSIS — R41841 Cognitive communication deficit: Secondary | ICD-10-CM | POA: Diagnosis not present

## 2016-06-19 DIAGNOSIS — R1312 Dysphagia, oropharyngeal phase: Secondary | ICD-10-CM | POA: Diagnosis not present

## 2016-06-19 DIAGNOSIS — I4891 Unspecified atrial fibrillation: Secondary | ICD-10-CM | POA: Diagnosis not present

## 2016-06-19 DIAGNOSIS — R1311 Dysphagia, oral phase: Secondary | ICD-10-CM | POA: Diagnosis not present

## 2016-06-19 DIAGNOSIS — M6281 Muscle weakness (generalized): Secondary | ICD-10-CM | POA: Diagnosis not present

## 2016-06-19 DIAGNOSIS — R2681 Unsteadiness on feet: Secondary | ICD-10-CM | POA: Diagnosis not present

## 2016-06-21 DIAGNOSIS — I4891 Unspecified atrial fibrillation: Secondary | ICD-10-CM | POA: Diagnosis not present

## 2016-06-21 DIAGNOSIS — R41841 Cognitive communication deficit: Secondary | ICD-10-CM | POA: Diagnosis not present

## 2016-06-21 DIAGNOSIS — R1312 Dysphagia, oropharyngeal phase: Secondary | ICD-10-CM | POA: Diagnosis not present

## 2016-06-21 DIAGNOSIS — R2681 Unsteadiness on feet: Secondary | ICD-10-CM | POA: Diagnosis not present

## 2016-06-21 DIAGNOSIS — R1311 Dysphagia, oral phase: Secondary | ICD-10-CM | POA: Diagnosis not present

## 2016-06-21 DIAGNOSIS — M6281 Muscle weakness (generalized): Secondary | ICD-10-CM | POA: Diagnosis not present

## 2016-06-22 DIAGNOSIS — R41841 Cognitive communication deficit: Secondary | ICD-10-CM | POA: Diagnosis not present

## 2016-06-22 DIAGNOSIS — R2681 Unsteadiness on feet: Secondary | ICD-10-CM | POA: Diagnosis not present

## 2016-06-22 DIAGNOSIS — R1311 Dysphagia, oral phase: Secondary | ICD-10-CM | POA: Diagnosis not present

## 2016-06-22 DIAGNOSIS — I4891 Unspecified atrial fibrillation: Secondary | ICD-10-CM | POA: Diagnosis not present

## 2016-06-22 DIAGNOSIS — M6281 Muscle weakness (generalized): Secondary | ICD-10-CM | POA: Diagnosis not present

## 2016-06-22 DIAGNOSIS — R1312 Dysphagia, oropharyngeal phase: Secondary | ICD-10-CM | POA: Diagnosis not present

## 2016-06-24 DIAGNOSIS — I4891 Unspecified atrial fibrillation: Secondary | ICD-10-CM | POA: Diagnosis not present

## 2016-06-24 DIAGNOSIS — R1311 Dysphagia, oral phase: Secondary | ICD-10-CM | POA: Diagnosis not present

## 2016-06-24 DIAGNOSIS — M6281 Muscle weakness (generalized): Secondary | ICD-10-CM | POA: Diagnosis not present

## 2016-06-24 DIAGNOSIS — R1312 Dysphagia, oropharyngeal phase: Secondary | ICD-10-CM | POA: Diagnosis not present

## 2016-06-24 DIAGNOSIS — R41841 Cognitive communication deficit: Secondary | ICD-10-CM | POA: Diagnosis not present

## 2016-06-24 DIAGNOSIS — R2681 Unsteadiness on feet: Secondary | ICD-10-CM | POA: Diagnosis not present

## 2016-06-25 DIAGNOSIS — R2681 Unsteadiness on feet: Secondary | ICD-10-CM | POA: Diagnosis not present

## 2016-06-25 DIAGNOSIS — R1312 Dysphagia, oropharyngeal phase: Secondary | ICD-10-CM | POA: Diagnosis not present

## 2016-06-25 DIAGNOSIS — R1311 Dysphagia, oral phase: Secondary | ICD-10-CM | POA: Diagnosis not present

## 2016-06-25 DIAGNOSIS — I4891 Unspecified atrial fibrillation: Secondary | ICD-10-CM | POA: Diagnosis not present

## 2016-06-25 DIAGNOSIS — M6281 Muscle weakness (generalized): Secondary | ICD-10-CM | POA: Diagnosis not present

## 2016-06-25 DIAGNOSIS — R41841 Cognitive communication deficit: Secondary | ICD-10-CM | POA: Diagnosis not present

## 2016-06-26 DIAGNOSIS — R1312 Dysphagia, oropharyngeal phase: Secondary | ICD-10-CM | POA: Diagnosis not present

## 2016-06-26 DIAGNOSIS — R2681 Unsteadiness on feet: Secondary | ICD-10-CM | POA: Diagnosis not present

## 2016-06-26 DIAGNOSIS — I4891 Unspecified atrial fibrillation: Secondary | ICD-10-CM | POA: Diagnosis not present

## 2016-06-26 DIAGNOSIS — M6281 Muscle weakness (generalized): Secondary | ICD-10-CM | POA: Diagnosis not present

## 2016-06-26 DIAGNOSIS — R1311 Dysphagia, oral phase: Secondary | ICD-10-CM | POA: Diagnosis not present

## 2016-06-26 DIAGNOSIS — R41841 Cognitive communication deficit: Secondary | ICD-10-CM | POA: Diagnosis not present

## 2016-06-27 DIAGNOSIS — I4891 Unspecified atrial fibrillation: Secondary | ICD-10-CM | POA: Diagnosis not present

## 2016-06-27 DIAGNOSIS — R41841 Cognitive communication deficit: Secondary | ICD-10-CM | POA: Diagnosis not present

## 2016-06-27 DIAGNOSIS — R2681 Unsteadiness on feet: Secondary | ICD-10-CM | POA: Diagnosis not present

## 2016-06-27 DIAGNOSIS — R1312 Dysphagia, oropharyngeal phase: Secondary | ICD-10-CM | POA: Diagnosis not present

## 2016-06-27 DIAGNOSIS — M6281 Muscle weakness (generalized): Secondary | ICD-10-CM | POA: Diagnosis not present

## 2016-06-27 DIAGNOSIS — R1311 Dysphagia, oral phase: Secondary | ICD-10-CM | POA: Diagnosis not present

## 2016-06-28 DIAGNOSIS — R1312 Dysphagia, oropharyngeal phase: Secondary | ICD-10-CM | POA: Diagnosis not present

## 2016-06-28 DIAGNOSIS — I4891 Unspecified atrial fibrillation: Secondary | ICD-10-CM | POA: Diagnosis not present

## 2016-06-28 DIAGNOSIS — R41841 Cognitive communication deficit: Secondary | ICD-10-CM | POA: Diagnosis not present

## 2016-06-28 DIAGNOSIS — R1311 Dysphagia, oral phase: Secondary | ICD-10-CM | POA: Diagnosis not present

## 2016-06-28 DIAGNOSIS — R2681 Unsteadiness on feet: Secondary | ICD-10-CM | POA: Diagnosis not present

## 2016-06-28 DIAGNOSIS — M6281 Muscle weakness (generalized): Secondary | ICD-10-CM | POA: Diagnosis not present

## 2016-07-01 DIAGNOSIS — R1311 Dysphagia, oral phase: Secondary | ICD-10-CM | POA: Diagnosis not present

## 2016-07-01 DIAGNOSIS — R1312 Dysphagia, oropharyngeal phase: Secondary | ICD-10-CM | POA: Diagnosis not present

## 2016-07-01 DIAGNOSIS — M6281 Muscle weakness (generalized): Secondary | ICD-10-CM | POA: Diagnosis not present

## 2016-07-01 DIAGNOSIS — R41841 Cognitive communication deficit: Secondary | ICD-10-CM | POA: Diagnosis not present

## 2016-07-01 DIAGNOSIS — R2681 Unsteadiness on feet: Secondary | ICD-10-CM | POA: Diagnosis not present

## 2016-07-01 DIAGNOSIS — I4891 Unspecified atrial fibrillation: Secondary | ICD-10-CM | POA: Diagnosis not present

## 2016-07-02 DIAGNOSIS — I4891 Unspecified atrial fibrillation: Secondary | ICD-10-CM | POA: Diagnosis not present

## 2016-07-02 DIAGNOSIS — R1311 Dysphagia, oral phase: Secondary | ICD-10-CM | POA: Diagnosis not present

## 2016-07-02 DIAGNOSIS — R1312 Dysphagia, oropharyngeal phase: Secondary | ICD-10-CM | POA: Diagnosis not present

## 2016-07-02 DIAGNOSIS — R41841 Cognitive communication deficit: Secondary | ICD-10-CM | POA: Diagnosis not present

## 2016-07-02 DIAGNOSIS — M6281 Muscle weakness (generalized): Secondary | ICD-10-CM | POA: Diagnosis not present

## 2016-07-02 DIAGNOSIS — R2681 Unsteadiness on feet: Secondary | ICD-10-CM | POA: Diagnosis not present

## 2016-07-03 DIAGNOSIS — R41841 Cognitive communication deficit: Secondary | ICD-10-CM | POA: Diagnosis not present

## 2016-07-03 DIAGNOSIS — R1311 Dysphagia, oral phase: Secondary | ICD-10-CM | POA: Diagnosis not present

## 2016-07-03 DIAGNOSIS — R1312 Dysphagia, oropharyngeal phase: Secondary | ICD-10-CM | POA: Diagnosis not present

## 2016-07-03 DIAGNOSIS — R2681 Unsteadiness on feet: Secondary | ICD-10-CM | POA: Diagnosis not present

## 2016-07-03 DIAGNOSIS — I4891 Unspecified atrial fibrillation: Secondary | ICD-10-CM | POA: Diagnosis not present

## 2016-07-03 DIAGNOSIS — M6281 Muscle weakness (generalized): Secondary | ICD-10-CM | POA: Diagnosis not present

## 2016-07-11 DIAGNOSIS — R21 Rash and other nonspecific skin eruption: Secondary | ICD-10-CM | POA: Diagnosis not present

## 2016-07-11 DIAGNOSIS — I482 Chronic atrial fibrillation: Secondary | ICD-10-CM | POA: Diagnosis not present

## 2016-07-11 DIAGNOSIS — I10 Essential (primary) hypertension: Secondary | ICD-10-CM | POA: Diagnosis not present

## 2016-07-11 DIAGNOSIS — K5090 Crohn's disease, unspecified, without complications: Secondary | ICD-10-CM | POA: Diagnosis not present

## 2016-07-16 ENCOUNTER — Ambulatory Visit: Payer: Medicare Other | Admitting: Podiatry

## 2016-07-26 DIAGNOSIS — R278 Other lack of coordination: Secondary | ICD-10-CM | POA: Diagnosis not present

## 2016-07-26 DIAGNOSIS — K580 Irritable bowel syndrome with diarrhea: Secondary | ICD-10-CM | POA: Diagnosis not present

## 2016-07-26 DIAGNOSIS — R1312 Dysphagia, oropharyngeal phase: Secondary | ICD-10-CM | POA: Diagnosis not present

## 2016-07-26 DIAGNOSIS — R2681 Unsteadiness on feet: Secondary | ICD-10-CM | POA: Diagnosis not present

## 2016-07-26 DIAGNOSIS — M6281 Muscle weakness (generalized): Secondary | ICD-10-CM | POA: Diagnosis not present

## 2016-07-26 DIAGNOSIS — R1311 Dysphagia, oral phase: Secondary | ICD-10-CM | POA: Diagnosis not present

## 2016-07-26 DIAGNOSIS — R41841 Cognitive communication deficit: Secondary | ICD-10-CM | POA: Diagnosis not present

## 2016-07-29 DIAGNOSIS — R41841 Cognitive communication deficit: Secondary | ICD-10-CM | POA: Diagnosis not present

## 2016-07-29 DIAGNOSIS — K580 Irritable bowel syndrome with diarrhea: Secondary | ICD-10-CM | POA: Diagnosis not present

## 2016-07-29 DIAGNOSIS — M6281 Muscle weakness (generalized): Secondary | ICD-10-CM | POA: Diagnosis not present

## 2016-07-29 DIAGNOSIS — R1312 Dysphagia, oropharyngeal phase: Secondary | ICD-10-CM | POA: Diagnosis not present

## 2016-07-29 DIAGNOSIS — R278 Other lack of coordination: Secondary | ICD-10-CM | POA: Diagnosis not present

## 2016-07-29 DIAGNOSIS — R2681 Unsteadiness on feet: Secondary | ICD-10-CM | POA: Diagnosis not present

## 2016-07-30 DIAGNOSIS — R1312 Dysphagia, oropharyngeal phase: Secondary | ICD-10-CM | POA: Diagnosis not present

## 2016-07-30 DIAGNOSIS — M6281 Muscle weakness (generalized): Secondary | ICD-10-CM | POA: Diagnosis not present

## 2016-07-30 DIAGNOSIS — R2681 Unsteadiness on feet: Secondary | ICD-10-CM | POA: Diagnosis not present

## 2016-07-30 DIAGNOSIS — R278 Other lack of coordination: Secondary | ICD-10-CM | POA: Diagnosis not present

## 2016-07-30 DIAGNOSIS — K580 Irritable bowel syndrome with diarrhea: Secondary | ICD-10-CM | POA: Diagnosis not present

## 2016-07-30 DIAGNOSIS — R41841 Cognitive communication deficit: Secondary | ICD-10-CM | POA: Diagnosis not present

## 2016-07-31 DIAGNOSIS — R2681 Unsteadiness on feet: Secondary | ICD-10-CM | POA: Diagnosis not present

## 2016-07-31 DIAGNOSIS — K580 Irritable bowel syndrome with diarrhea: Secondary | ICD-10-CM | POA: Diagnosis not present

## 2016-07-31 DIAGNOSIS — R1312 Dysphagia, oropharyngeal phase: Secondary | ICD-10-CM | POA: Diagnosis not present

## 2016-07-31 DIAGNOSIS — M6281 Muscle weakness (generalized): Secondary | ICD-10-CM | POA: Diagnosis not present

## 2016-07-31 DIAGNOSIS — R278 Other lack of coordination: Secondary | ICD-10-CM | POA: Diagnosis not present

## 2016-07-31 DIAGNOSIS — R41841 Cognitive communication deficit: Secondary | ICD-10-CM | POA: Diagnosis not present

## 2016-08-05 DIAGNOSIS — W19XXXD Unspecified fall, subsequent encounter: Secondary | ICD-10-CM | POA: Diagnosis not present

## 2016-08-05 DIAGNOSIS — I10 Essential (primary) hypertension: Secondary | ICD-10-CM | POA: Diagnosis not present

## 2016-08-05 DIAGNOSIS — I482 Chronic atrial fibrillation: Secondary | ICD-10-CM | POA: Diagnosis not present

## 2016-08-05 DIAGNOSIS — K5090 Crohn's disease, unspecified, without complications: Secondary | ICD-10-CM | POA: Diagnosis not present

## 2016-08-07 DIAGNOSIS — Z961 Presence of intraocular lens: Secondary | ICD-10-CM | POA: Diagnosis not present

## 2016-08-07 DIAGNOSIS — H04123 Dry eye syndrome of bilateral lacrimal glands: Secondary | ICD-10-CM | POA: Diagnosis not present

## 2016-08-07 DIAGNOSIS — H401134 Primary open-angle glaucoma, bilateral, indeterminate stage: Secondary | ICD-10-CM | POA: Diagnosis not present

## 2016-08-28 DIAGNOSIS — I480 Paroxysmal atrial fibrillation: Secondary | ICD-10-CM | POA: Diagnosis not present

## 2016-08-28 DIAGNOSIS — F0390 Unspecified dementia without behavioral disturbance: Secondary | ICD-10-CM | POA: Diagnosis not present

## 2016-08-28 DIAGNOSIS — I10 Essential (primary) hypertension: Secondary | ICD-10-CM | POA: Diagnosis not present

## 2016-10-04 DIAGNOSIS — D649 Anemia, unspecified: Secondary | ICD-10-CM | POA: Diagnosis not present

## 2016-10-04 DIAGNOSIS — E119 Type 2 diabetes mellitus without complications: Secondary | ICD-10-CM | POA: Diagnosis not present

## 2016-10-04 DIAGNOSIS — Z79899 Other long term (current) drug therapy: Secondary | ICD-10-CM | POA: Diagnosis not present

## 2016-10-04 DIAGNOSIS — E785 Hyperlipidemia, unspecified: Secondary | ICD-10-CM | POA: Diagnosis not present

## 2016-10-04 DIAGNOSIS — I10 Essential (primary) hypertension: Secondary | ICD-10-CM | POA: Diagnosis not present

## 2016-10-09 IMAGING — CR DG ABDOMEN 2V
3 series · 3 of 3 positions shown · non-contrast
Comparison: 04/11/2014

CLINICAL DATA: Abdominal distention, patient thinks he may be
constipated, personal history of hypertension, coronary artery
disease, prostate cancer, colitis, colonic diverticulosis, prior
obstruction

EXAM:
ABDOMEN - 2 VIEW

[w abdomen decub (1 of 2)]
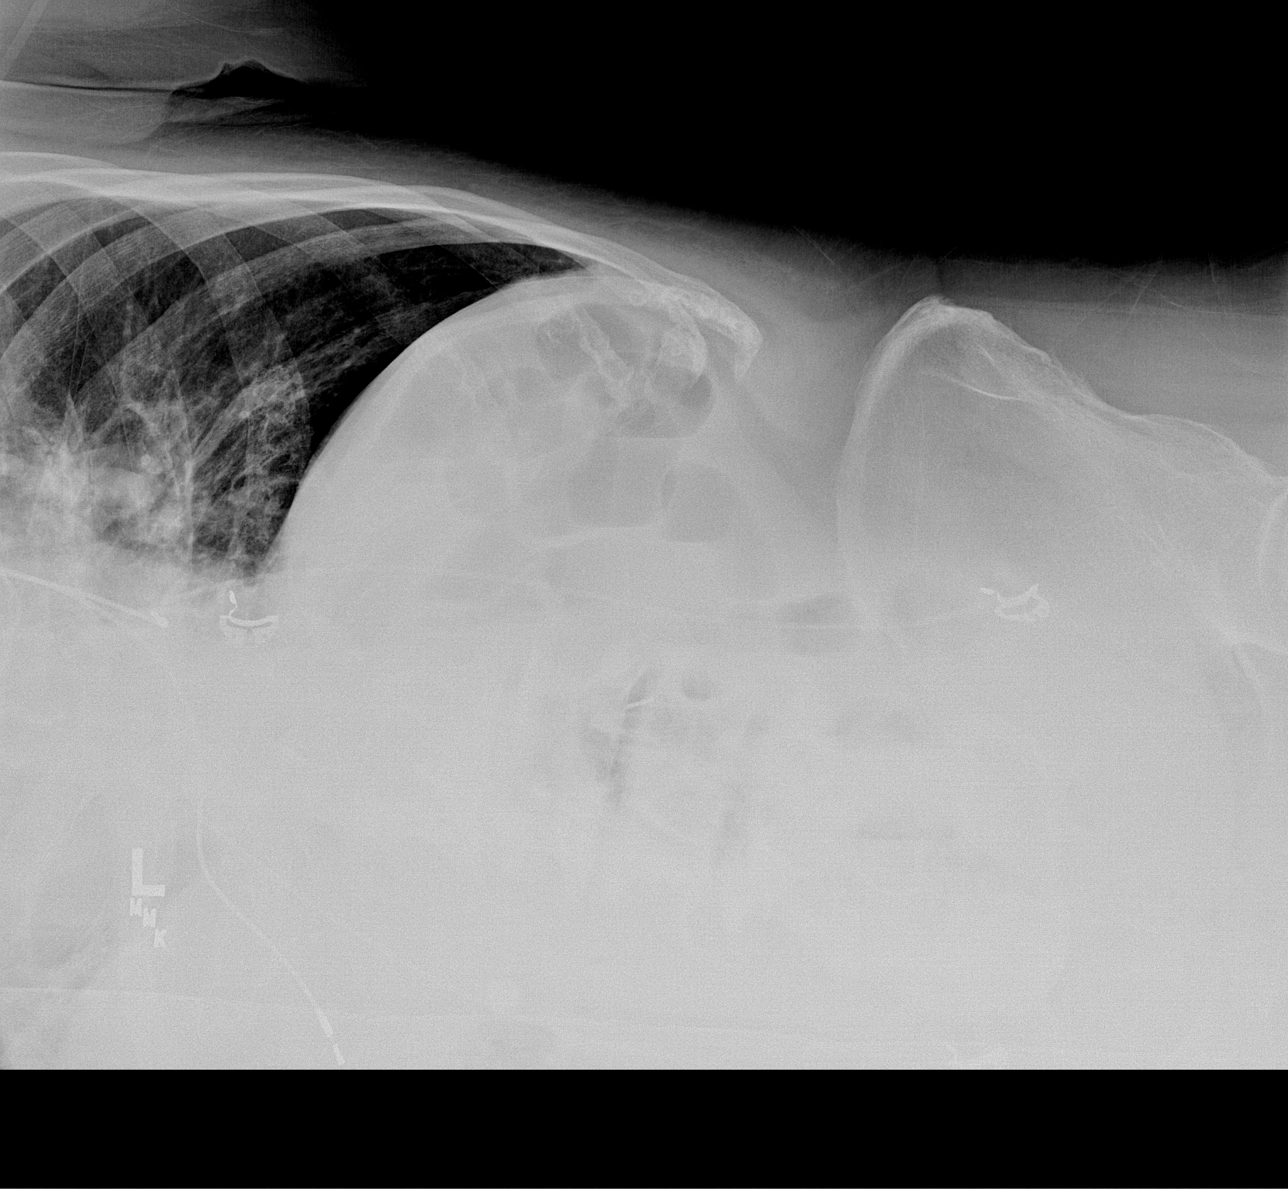

[w abdomen decub (2 of 2)]
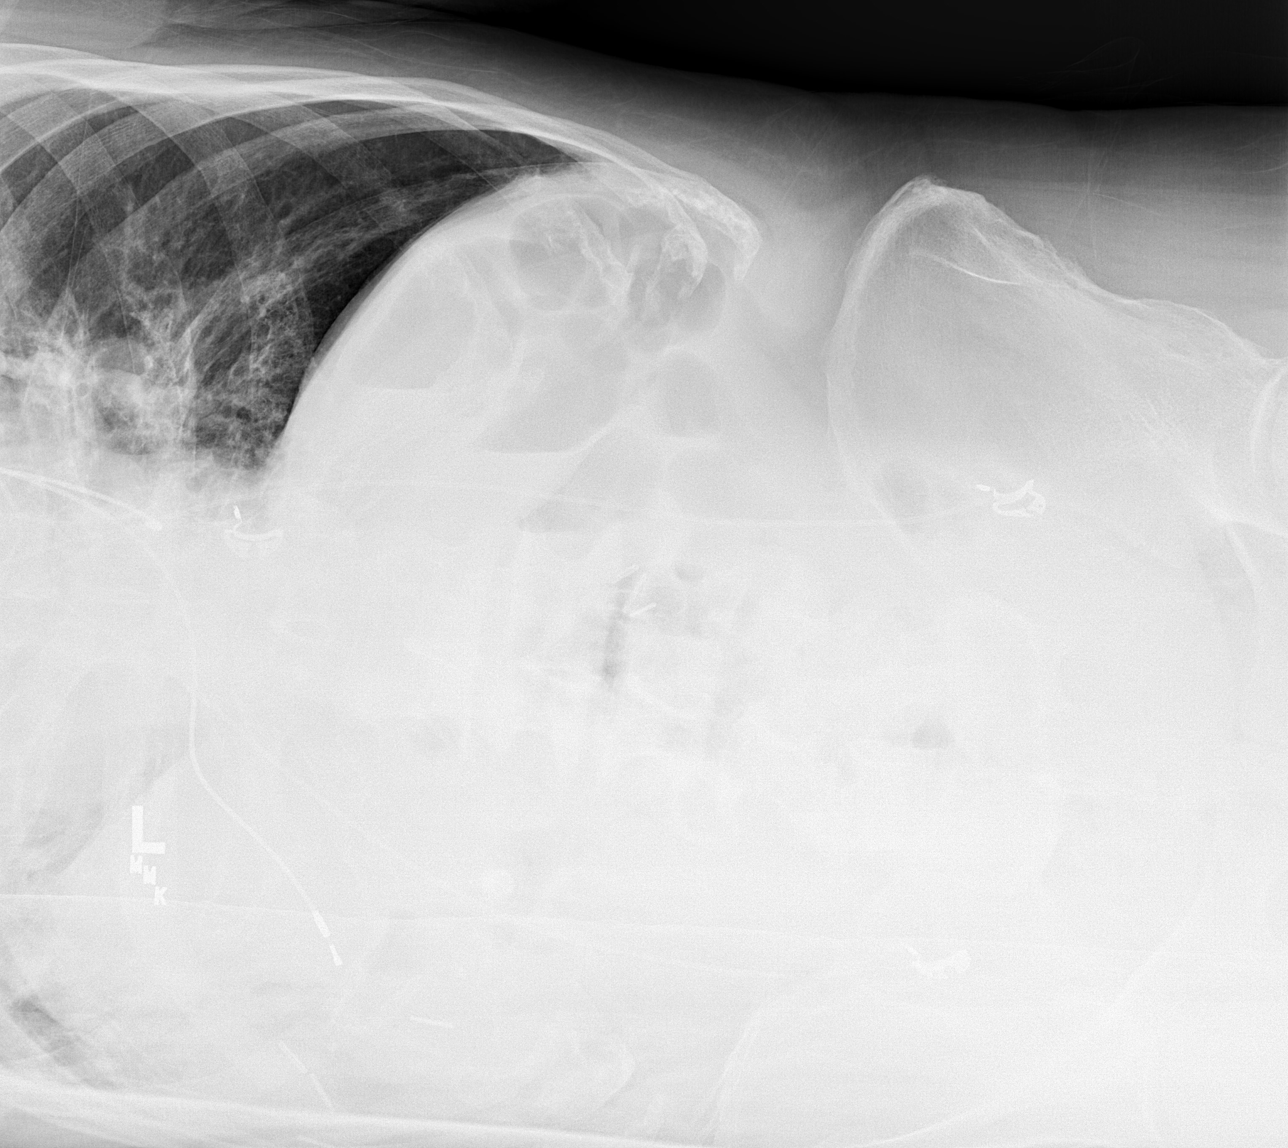

[x abdomen supine]
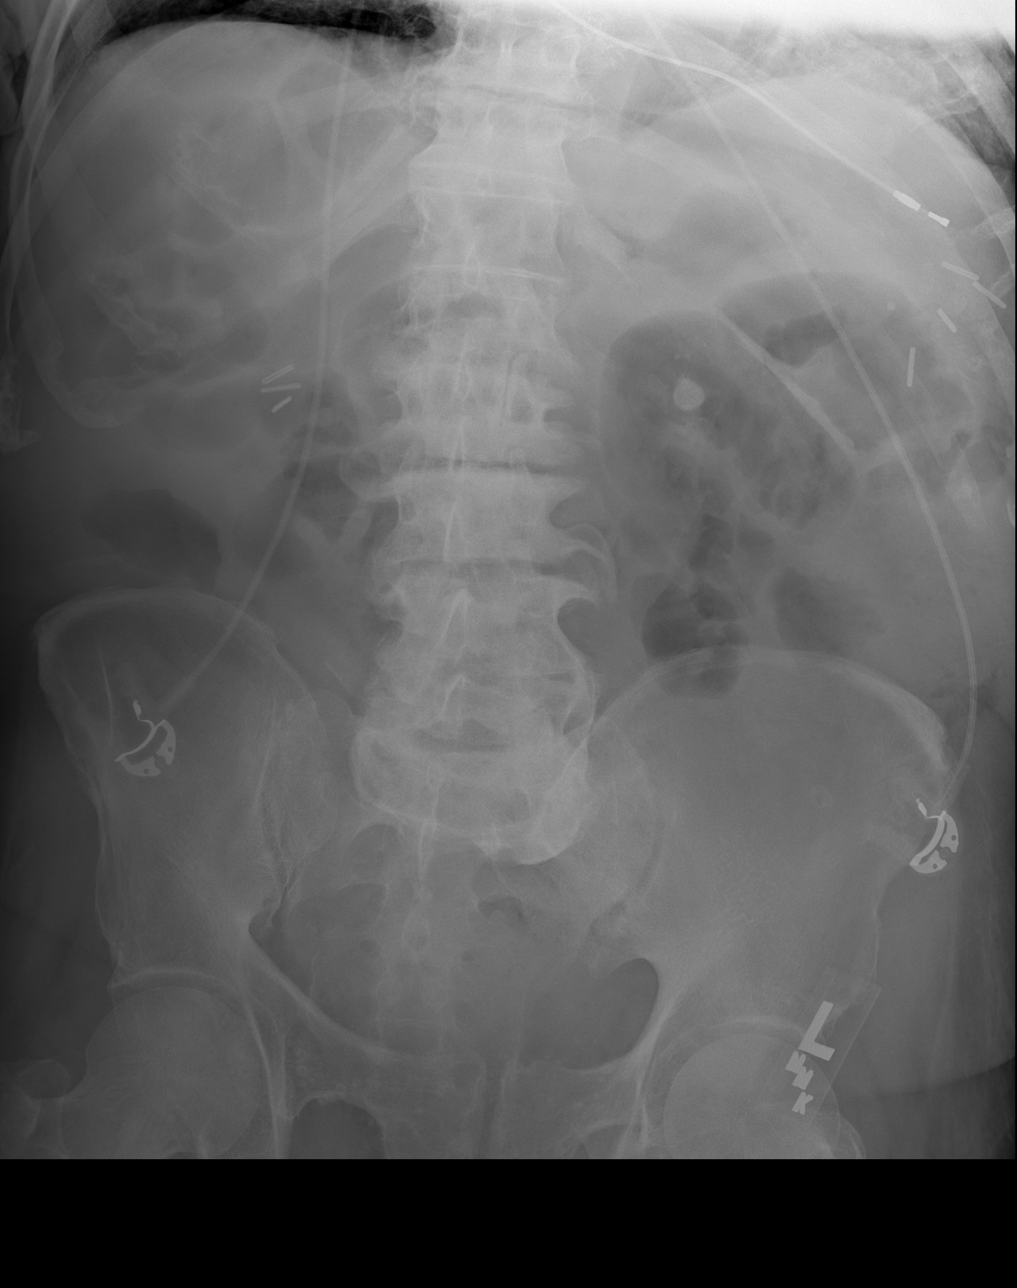

[3 of 3 positions shown; findings below may reference images not displayed]

FINDINGS: Pancreatic calcifications are better visualized on prior CT.

Nonobstructive bowel gas pattern.

No definite bowel dilatation, bowel wall thickening or free
intraperitoneal air.

Bones demineralized with multilevel degenerative disc disease
changes of the lumbar spine.

No definite urinary tract calcification.

Surgical clips RIGHT upper quadrant question cholecystectomy.
IMPRESSION: Nonobstructive bowel gas pattern.

## 2016-10-16 DIAGNOSIS — R627 Adult failure to thrive: Secondary | ICD-10-CM | POA: Diagnosis not present

## 2016-10-16 DIAGNOSIS — K5090 Crohn's disease, unspecified, without complications: Secondary | ICD-10-CM | POA: Diagnosis not present

## 2016-10-16 DIAGNOSIS — F0390 Unspecified dementia without behavioral disturbance: Secondary | ICD-10-CM | POA: Diagnosis not present

## 2016-10-16 DIAGNOSIS — I4891 Unspecified atrial fibrillation: Secondary | ICD-10-CM | POA: Diagnosis not present

## 2016-10-16 DIAGNOSIS — I482 Chronic atrial fibrillation: Secondary | ICD-10-CM | POA: Diagnosis not present

## 2016-10-16 DIAGNOSIS — I10 Essential (primary) hypertension: Secondary | ICD-10-CM | POA: Diagnosis not present

## 2016-10-16 DIAGNOSIS — N400 Enlarged prostate without lower urinary tract symptoms: Secondary | ICD-10-CM | POA: Diagnosis not present

## 2016-10-17 ENCOUNTER — Emergency Department (HOSPITAL_COMMUNITY): Payer: Medicare Other

## 2016-10-17 ENCOUNTER — Encounter (HOSPITAL_COMMUNITY): Payer: Self-pay | Admitting: *Deleted

## 2016-10-17 ENCOUNTER — Inpatient Hospital Stay (HOSPITAL_COMMUNITY)
Admission: EM | Admit: 2016-10-17 | Discharge: 2016-10-23 | DRG: 871 | Disposition: A | Discharge status: Skilled Nursing Facility | Payer: Medicare Other | Attending: Internal Medicine | Admitting: Internal Medicine

## 2016-10-17 DIAGNOSIS — B9620 Unspecified Escherichia coli [E. coli] as the cause of diseases classified elsewhere: Secondary | ICD-10-CM | POA: Diagnosis present

## 2016-10-17 DIAGNOSIS — M6281 Muscle weakness (generalized): Secondary | ICD-10-CM | POA: Diagnosis not present

## 2016-10-17 DIAGNOSIS — K5080 Crohn's disease of both small and large intestine without complications: Secondary | ICD-10-CM | POA: Diagnosis present

## 2016-10-17 DIAGNOSIS — N183 Chronic kidney disease, stage 3 (moderate): Secondary | ICD-10-CM | POA: Diagnosis present

## 2016-10-17 DIAGNOSIS — T68XXXA Hypothermia, initial encounter: Secondary | ICD-10-CM

## 2016-10-17 DIAGNOSIS — Z66 Do not resuscitate: Secondary | ICD-10-CM | POA: Diagnosis present

## 2016-10-17 DIAGNOSIS — R402441 Other coma, without documented Glasgow coma scale score, or with partial score reported, in the field [EMT or ambulance]: Secondary | ICD-10-CM | POA: Diagnosis not present

## 2016-10-17 DIAGNOSIS — E162 Hypoglycemia, unspecified: Secondary | ICD-10-CM | POA: Diagnosis present

## 2016-10-17 DIAGNOSIS — F0390 Unspecified dementia without behavioral disturbance: Secondary | ICD-10-CM | POA: Diagnosis not present

## 2016-10-17 DIAGNOSIS — A419 Sepsis, unspecified organism: Secondary | ICD-10-CM | POA: Diagnosis not present

## 2016-10-17 DIAGNOSIS — R0902 Hypoxemia: Secondary | ICD-10-CM | POA: Diagnosis present

## 2016-10-17 DIAGNOSIS — Y95 Nosocomial condition: Secondary | ICD-10-CM | POA: Diagnosis present

## 2016-10-17 DIAGNOSIS — R2681 Unsteadiness on feet: Secondary | ICD-10-CM | POA: Diagnosis not present

## 2016-10-17 DIAGNOSIS — R1310 Dysphagia, unspecified: Secondary | ICD-10-CM | POA: Diagnosis present

## 2016-10-17 DIAGNOSIS — R4182 Altered mental status, unspecified: Secondary | ICD-10-CM

## 2016-10-17 DIAGNOSIS — Z1612 Extended spectrum beta lactamase (ESBL) resistance: Secondary | ICD-10-CM | POA: Diagnosis not present

## 2016-10-17 DIAGNOSIS — R400 Somnolence: Secondary | ICD-10-CM | POA: Diagnosis not present

## 2016-10-17 DIAGNOSIS — R509 Fever, unspecified: Secondary | ICD-10-CM

## 2016-10-17 DIAGNOSIS — E860 Dehydration: Secondary | ICD-10-CM | POA: Diagnosis present

## 2016-10-17 DIAGNOSIS — R1311 Dysphagia, oral phase: Secondary | ICD-10-CM | POA: Diagnosis not present

## 2016-10-17 DIAGNOSIS — D649 Anemia, unspecified: Secondary | ICD-10-CM | POA: Diagnosis present

## 2016-10-17 DIAGNOSIS — N390 Urinary tract infection, site not specified: Secondary | ICD-10-CM

## 2016-10-17 DIAGNOSIS — J189 Pneumonia, unspecified organism: Secondary | ICD-10-CM | POA: Diagnosis present

## 2016-10-17 DIAGNOSIS — M7989 Other specified soft tissue disorders: Secondary | ICD-10-CM | POA: Diagnosis not present

## 2016-10-17 DIAGNOSIS — R918 Other nonspecific abnormal finding of lung field: Secondary | ICD-10-CM | POA: Diagnosis not present

## 2016-10-17 DIAGNOSIS — I482 Chronic atrial fibrillation: Secondary | ICD-10-CM | POA: Diagnosis not present

## 2016-10-17 DIAGNOSIS — K5090 Crohn's disease, unspecified, without complications: Secondary | ICD-10-CM | POA: Diagnosis not present

## 2016-10-17 DIAGNOSIS — H409 Unspecified glaucoma: Secondary | ICD-10-CM | POA: Diagnosis present

## 2016-10-17 DIAGNOSIS — Z993 Dependence on wheelchair: Secondary | ICD-10-CM

## 2016-10-17 DIAGNOSIS — Z8546 Personal history of malignant neoplasm of prostate: Secondary | ICD-10-CM

## 2016-10-17 DIAGNOSIS — I5032 Chronic diastolic (congestive) heart failure: Secondary | ICD-10-CM | POA: Diagnosis present

## 2016-10-17 DIAGNOSIS — K50118 Crohn's disease of large intestine with other complication: Secondary | ICD-10-CM | POA: Diagnosis not present

## 2016-10-17 DIAGNOSIS — I959 Hypotension, unspecified: Secondary | ICD-10-CM | POA: Diagnosis not present

## 2016-10-17 DIAGNOSIS — Z950 Presence of cardiac pacemaker: Secondary | ICD-10-CM

## 2016-10-17 DIAGNOSIS — Z515 Encounter for palliative care: Secondary | ICD-10-CM

## 2016-10-17 DIAGNOSIS — K580 Irritable bowel syndrome with diarrhea: Secondary | ICD-10-CM | POA: Diagnosis not present

## 2016-10-17 DIAGNOSIS — R262 Difficulty in walking, not elsewhere classified: Secondary | ICD-10-CM | POA: Diagnosis not present

## 2016-10-17 DIAGNOSIS — Z9049 Acquired absence of other specified parts of digestive tract: Secondary | ICD-10-CM

## 2016-10-17 DIAGNOSIS — R278 Other lack of coordination: Secondary | ICD-10-CM | POA: Diagnosis not present

## 2016-10-17 DIAGNOSIS — Z79899 Other long term (current) drug therapy: Secondary | ICD-10-CM

## 2016-10-17 DIAGNOSIS — Z7401 Bed confinement status: Secondary | ICD-10-CM

## 2016-10-17 DIAGNOSIS — R680 Hypothermia, not associated with low environmental temperature: Secondary | ICD-10-CM | POA: Diagnosis present

## 2016-10-17 DIAGNOSIS — Z923 Personal history of irradiation: Secondary | ICD-10-CM

## 2016-10-17 DIAGNOSIS — R627 Adult failure to thrive: Secondary | ICD-10-CM | POA: Diagnosis present

## 2016-10-17 DIAGNOSIS — R579 Shock, unspecified: Secondary | ICD-10-CM | POA: Diagnosis present

## 2016-10-17 DIAGNOSIS — I10 Essential (primary) hypertension: Secondary | ICD-10-CM | POA: Diagnosis not present

## 2016-10-17 DIAGNOSIS — I4891 Unspecified atrial fibrillation: Secondary | ICD-10-CM | POA: Diagnosis present

## 2016-10-17 DIAGNOSIS — I130 Hypertensive heart and chronic kidney disease with heart failure and stage 1 through stage 4 chronic kidney disease, or unspecified chronic kidney disease: Secondary | ICD-10-CM | POA: Diagnosis present

## 2016-10-17 DIAGNOSIS — K5010 Crohn's disease of large intestine without complications: Secondary | ICD-10-CM | POA: Diagnosis present

## 2016-10-17 DIAGNOSIS — B9629 Other Escherichia coli [E. coli] as the cause of diseases classified elsewhere: Secondary | ICD-10-CM

## 2016-10-17 DIAGNOSIS — R600 Localized edema: Secondary | ICD-10-CM | POA: Diagnosis not present

## 2016-10-17 DIAGNOSIS — I2510 Atherosclerotic heart disease of native coronary artery without angina pectoris: Secondary | ICD-10-CM | POA: Diagnosis present

## 2016-10-17 DIAGNOSIS — G9341 Metabolic encephalopathy: Secondary | ICD-10-CM | POA: Diagnosis present

## 2016-10-17 DIAGNOSIS — R6521 Severe sepsis with septic shock: Secondary | ICD-10-CM | POA: Diagnosis present

## 2016-10-17 DIAGNOSIS — I481 Persistent atrial fibrillation: Secondary | ICD-10-CM | POA: Diagnosis not present

## 2016-10-17 DIAGNOSIS — R6520 Severe sepsis without septic shock: Secondary | ICD-10-CM | POA: Diagnosis not present

## 2016-10-17 DIAGNOSIS — R1312 Dysphagia, oropharyngeal phase: Secondary | ICD-10-CM | POA: Diagnosis not present

## 2016-10-17 DIAGNOSIS — Z8711 Personal history of peptic ulcer disease: Secondary | ICD-10-CM

## 2016-10-17 DIAGNOSIS — H547 Unspecified visual loss: Secondary | ICD-10-CM | POA: Diagnosis present

## 2016-10-17 DIAGNOSIS — Z7982 Long term (current) use of aspirin: Secondary | ICD-10-CM

## 2016-10-17 DIAGNOSIS — R41841 Cognitive communication deficit: Secondary | ICD-10-CM | POA: Diagnosis not present

## 2016-10-17 DIAGNOSIS — Z452 Encounter for adjustment and management of vascular access device: Secondary | ICD-10-CM | POA: Diagnosis not present

## 2016-10-17 LAB — CBC WITH DIFFERENTIAL/PLATELET
BASOS ABS: 0 10*3/uL (ref 0.0–0.1)
Basophils Relative: 0 %
EOS ABS: 0.1 10*3/uL (ref 0.0–0.7)
EOS PCT: 2 %
HCT: 31.5 % — ABNORMAL LOW (ref 39.0–52.0)
Hemoglobin: 10.9 g/dL — ABNORMAL LOW (ref 13.0–17.0)
Lymphocytes Relative: 19 %
Lymphs Abs: 1.2 10*3/uL (ref 0.7–4.0)
MCH: 32.8 pg (ref 26.0–34.0)
MCHC: 34.6 g/dL (ref 30.0–36.0)
MCV: 94.9 fL (ref 78.0–100.0)
Monocytes Absolute: 0.4 10*3/uL (ref 0.1–1.0)
Monocytes Relative: 6 %
Neutro Abs: 4.6 10*3/uL (ref 1.7–7.7)
Neutrophils Relative %: 73 %
PLATELETS: 141 10*3/uL — AB (ref 150–400)
RBC: 3.32 MIL/uL — AB (ref 4.22–5.81)
RDW: 17 % — ABNORMAL HIGH (ref 11.5–15.5)
WBC: 6.2 10*3/uL (ref 4.0–10.5)

## 2016-10-17 LAB — COMPREHENSIVE METABOLIC PANEL
ALT: 16 U/L — AB (ref 17–63)
AST: 24 U/L (ref 15–41)
Albumin: 2.1 g/dL — ABNORMAL LOW (ref 3.5–5.0)
Alkaline Phosphatase: 82 U/L (ref 38–126)
Anion gap: 7 (ref 5–15)
BILIRUBIN TOTAL: 0.7 mg/dL (ref 0.3–1.2)
BUN: 10 mg/dL (ref 6–20)
CALCIUM: 8.5 mg/dL — AB (ref 8.9–10.3)
CO2: 23 mmol/L (ref 22–32)
CREATININE: 1.03 mg/dL (ref 0.61–1.24)
Chloride: 114 mmol/L — ABNORMAL HIGH (ref 101–111)
GFR calc non Af Amer: 58 mL/min — ABNORMAL LOW (ref >60)
Glucose, Bld: 104 mg/dL — ABNORMAL HIGH (ref 65–99)
Potassium: 3.9 mmol/L (ref 3.5–5.1)
Sodium: 144 mmol/L (ref 135–145)
TOTAL PROTEIN: 6.3 g/dL — AB (ref 6.5–8.1)

## 2016-10-17 LAB — URINALYSIS, ROUTINE W REFLEX MICROSCOPIC
BILIRUBIN URINE: NEGATIVE
Glucose, UA: NEGATIVE mg/dL
Ketones, ur: NEGATIVE mg/dL
NITRITE: NEGATIVE
PH: 5 (ref 5.0–8.0)
Protein, ur: NEGATIVE mg/dL
Specific Gravity, Urine: 1.021 (ref 1.005–1.030)

## 2016-10-17 LAB — I-STAT CG4 LACTIC ACID, ED: Lactic Acid, Venous: 2.68 mmol/L (ref 0.5–1.9)

## 2016-10-17 LAB — MRSA PCR SCREENING: MRSA by PCR: NEGATIVE

## 2016-10-17 LAB — CBG MONITORING, ED: GLUCOSE-CAPILLARY: 87 mg/dL (ref 65–99)

## 2016-10-17 LAB — LACTIC ACID, PLASMA: LACTIC ACID, VENOUS: 1.2 mmol/L (ref 0.5–1.9)

## 2016-10-17 LAB — TROPONIN I: TROPONIN I: 0.04 ng/mL — AB (ref <0.03)

## 2016-10-17 MED ORDER — SODIUM CHLORIDE 0.9 % IV BOLUS (SEPSIS)
500.0000 mL | Freq: Once | INTRAVENOUS | Status: AC
  Administered 2016-10-17: 500 mL via INTRAVENOUS

## 2016-10-17 MED ORDER — ACETAMINOPHEN 325 MG PO TABS
650.0000 mg | ORAL_TABLET | Freq: Four times a day (QID) | ORAL | Status: DC | PRN
Start: 2016-10-17 — End: 2016-10-23

## 2016-10-17 MED ORDER — SODIUM CHLORIDE 0.9 % IV BOLUS (SEPSIS)
1000.0000 mL | Freq: Once | INTRAVENOUS | Status: AC
  Administered 2016-10-17: 1000 mL via INTRAVENOUS

## 2016-10-17 MED ORDER — PANTOPRAZOLE SODIUM 40 MG IV SOLR: 40.0000 mg | Freq: Every day | INTRAVENOUS | Status: DC

## 2016-10-17 MED ORDER — HEPARIN SODIUM (PORCINE) 5000 UNIT/ML IJ SOLN
5000.0000 [IU] | Freq: Three times a day (TID) | INTRAMUSCULAR | Status: DC
  Administered 2016-10-17 – 2016-10-23 (×18): 5000 [IU] via SUBCUTANEOUS
  Filled 2016-10-17 (×16): qty 1

## 2016-10-17 MED ORDER — PIPERACILLIN-TAZOBACTAM 3.375 G IVPB 30 MIN
3.3750 g | Freq: Once | INTRAVENOUS | Status: AC
Start: 2016-10-17 — End: 2016-10-17
  Administered 2016-10-17: 3.375 g via INTRAVENOUS
  Filled 2016-10-17: qty 50

## 2016-10-17 MED ORDER — ONDANSETRON HCL 4 MG PO TABS: 4.0000 mg | ORAL_TABLET | Freq: Four times a day (QID) | ORAL | Status: DC | PRN

## 2016-10-17 MED ORDER — ACETAMINOPHEN 650 MG RE SUPP: 650.0000 mg | Freq: Four times a day (QID) | RECTAL | Status: DC | PRN

## 2016-10-17 MED ORDER — VANCOMYCIN HCL IN DEXTROSE 1-5 GM/200ML-% IV SOLN
1000.0000 mg | Freq: Once | INTRAVENOUS | Status: AC
Start: 2016-10-17 — End: 2016-10-17
  Administered 2016-10-17: 1000 mg via INTRAVENOUS
  Filled 2016-10-17: qty 200

## 2016-10-17 MED ORDER — ONDANSETRON HCL 4 MG/2ML IJ SOLN: 4.0000 mg | Freq: Four times a day (QID) | INTRAMUSCULAR | Status: DC | PRN

## 2016-10-17 MED ORDER — PIPERACILLIN-TAZOBACTAM 3.375 G IVPB
3.3750 g | Freq: Three times a day (TID) | INTRAVENOUS | Status: DC
  Administered 2016-10-17 – 2016-10-20 (×8): 3.375 g via INTRAVENOUS
  Filled 2016-10-17 (×9): qty 50

## 2016-10-17 MED ORDER — SODIUM CHLORIDE 0.9 % IV SOLN
INTRAVENOUS | Status: DC
  Administered 2016-10-17: 20:00:00 via INTRAVENOUS

## 2016-10-17 MED ORDER — NOREPINEPHRINE BITARTRATE 1 MG/ML IV SOLN
0.0000 ug/min | Freq: Once | INTRAVENOUS | Status: AC
  Administered 2016-10-17: 2 ug/min via INTRAVENOUS
  Filled 2016-10-17: qty 4

## 2016-10-17 MED ORDER — SODIUM CHLORIDE 0.9 % IV SOLN: 250.0000 mL | INTRAVENOUS | Status: DC | PRN

## 2016-10-17 MED ORDER — VANCOMYCIN HCL 10 G IV SOLR
1250.0000 mg | INTRAVENOUS | Status: DC
  Administered 2016-10-18 – 2016-10-19 (×2): 1250 mg via INTRAVENOUS
  Filled 2016-10-17 (×2): qty 1250

## 2016-10-17 NOTE — ED Notes (Signed)
Per Rodman Pickle, RN, only able to collect one set of blood cultures from pt due to lack of vascular access. Antibiotics administered after collecting only one set. Dr. Alvino Chapel aware.

## 2016-10-17 NOTE — ED Triage Notes (Signed)
CBG 87. 

## 2016-10-17 NOTE — ED Notes (Signed)
Pt sons leaving to pick up car, encouraged family to stay with pt in the event of change in pt status. This nurse will call family with change in plan of care or when pt gets a room assignment.   Lehi Phifer: [phone] Michael: [phone]

## 2016-10-17 NOTE — H&P (Signed)
PULMONARY / CRITICAL CARE MEDICINE   Name: Edward Harvey MRN: [id number] DOB: July 01, 1915    ADMISSION DATE:  10/17/2016 CONSULTATION DATE:  10/17/2016  REFERRING MD:  Dr. Alvino Chapel  CHIEF COMPLAINT:  Hypotension  HISTORY OF PRESENT ILLNESS:   83 year old male with PMH as below, which is significant for advanced dementia, Crohn's ileocolitis s/p resection, HTN, pacemaker, and atrial fibrillation. He resides in SNF where he has apparently been declining for some time. He is essentially non-verbal and bedbound at baseline. 5/17 he presented to [facility] ED with complaints of alterred mental status per sons. He does have chronic diarrhea. In ED he was found to be hypotensive and hypothermic. Lactic acid mildly elevated. It was felt this was due to failure to thrive with poor PO intake. He remained hypotensive despite 1.5L IVF and he was started on norepinephrine for BP support. No clear infectious source identified, but he was started on broad spectrum antibiotics. Due to pressor demands PCCM asked to admit.  PAST MEDICAL HISTORY :  He  has a past medical history of A-fib ([place]); Acute bronchitis; Acute renal failure (ARF) ([place]); Anemia; Antral ulcer (2011); Atrioventricular block, complete (HCC); C. difficile colitis; CAD (coronary artery disease); Crohn disease ([place]); Diverticulosis of colon with hemorrhage; DJD (degenerative joint disease); Gallstone; Glaucoma; Gout; Hematoma; Hemorrhage of gastrointestinal tract, unspecified; Hyperkalemia; Hypertension; Inguinal hernia; Internal hemorrhoids without mention of complication; Mild dementia; Osteoarthritis; Pacemaker; Prostate cancer ([place]); Segmental colitis ([place]); Traumatic subdural hematoma ([place]); and Unspecified intestinal obstruction.  PAST SURGICAL HISTORY: He  has a past surgical history that includes pacemaker placement; Cataract extraction, bilateral; Subtotal colectomy (12/03); Lipoma excision (2001); Burr hole for subdural hematoma; Insert /  replace / remove pacemaker (1994; 11/2004); Pericardiocentesis (1994); Tonsillectomy and adenoidectomy (age 29); Colonoscopy (2009); and pacemaker generator change (N/A, 08/17/2014).  No Known Allergies  No current facility-administered medications on file prior to encounter.    Current Outpatient Prescriptions on File Prior to Encounter  Medication Sig  . allopurinol (ZYLOPRIM) 300 MG tablet Take 300 mg by mouth every morning.   Marland Kitchen aspirin 81 MG chewable tablet Chew 81 mg by mouth every evening.   . Cholecalciferol (VITAMIN D) 1000 UNITS capsule Take 1,000 Units by mouth every morning.   . diphenoxylate-atropine (LOMOTIL) 2.5-0.025 MG per tablet Take 2 tablets by mouth 4 (four) times daily as needed for diarrhea or loose stools.  . famotidine (PEPCID) 20 MG tablet Take 1 tablet (20 mg total) by mouth 2 (two) times daily. (Patient taking differently: Take 20 mg by mouth daily. )  . furosemide (LASIX) 20 MG tablet Take 20 mg by mouth every other day. Hold for SBP less than 100  . Multiple Vitamins-Minerals (CENTRUM SILVER PO) Take 1 tablet by mouth every morning.   . saccharomyces boulardii (FLORASTOR) 250 MG capsule Take 1 capsule (250 mg total) by mouth 2 (two) times daily.  . tamsulosin (FLOMAX) 0.4 MG CAPS capsule Take 0.4 mg by mouth daily after supper.  . Trospium Chloride (SANCTURA XR) 60 MG CP24 Take 60 mg by mouth every evening.   . vitamin C (ASCORBIC ACID) 500 MG tablet Take 1 tablet (500 mg total) by mouth daily.  . mesalamine (PENTASA) 500 MG CR capsule Take 2 capsules (1,000 mg total) by mouth 3 (three) times daily. (Patient not taking: Reported on 10/17/2016)    FAMILY HISTORY:  His indicated that his mother is deceased. He indicated that his father is deceased.    SOCIAL HISTORY: He  reports  that he has never smoked. He has never used smokeless tobacco. He reports that he does not drink alcohol or use drugs.  REVIEW OF SYSTEMS:   unable  SUBJECTIVE:    VITAL SIGNS: BP  (!) 87/66   Pulse 70   Temp (S) (!) 94.4 F (34.7 C) (Rectal)   Resp 14   Ht 6' (1.829 m)   Wt 83.9 kg (185 lb)   SpO2 100%   BMI 25.09 kg/m   HEMODYNAMICS:    VENTILATOR SETTINGS:    INTAKE / OUTPUT: No intake/output data recorded.  PHYSICAL EXAMINATION: General:  Frail elderly male in NAD Neuro:  Lethargic, not responding to verbal or tactile stimulation.  HEENT:  [place]/AT, PERRL, no JVD Cardiovascular:  RRR, no MRG Lungs:  Clear bilateral breath sounds Abdomen:  Soft, non-tender, non-distended Musculoskeletal:  No acute deformity Skin:  Grossly intact, dry.   LABS:  BMET  Recent Labs Lab 10/17/16 1319  NA 144  K 3.9  CL 114*  CO2 23  BUN 10  CREATININE 1.03  GLUCOSE 104*    Electrolytes  Recent Labs Lab 10/17/16 1319  CALCIUM 8.5*    CBC  Recent Labs Lab 10/17/16 1319  WBC 6.2  HGB 10.9*  HCT 31.5*  PLT 141*    Coag's No results for input(s): APTT, INR in the last 168 hours.  Sepsis Markers  Recent Labs Lab 10/17/16 1356  LATICACIDVEN 2.68*    ABG No results for input(s): PHART, PCO2ART, PO2ART in the last 168 hours.  Liver Enzymes  Recent Labs Lab 10/17/16 1319  AST 24  ALT 16*  ALKPHOS 82  BILITOT 0.7  ALBUMIN 2.1*    Cardiac Enzymes  Recent Labs Lab 10/17/16 1319  TROPONINI 0.04*    Glucose  Recent Labs Lab 10/17/16 1256  GLUCAP 87    Imaging Dg Chest 1 View  Result Date: 10/17/2016 CLINICAL DATA:  Changing consciousness. EXAM: CHEST 1 VIEW COMPARISON:  September 10, 2013 FINDINGS: No pneumothorax. The cardiomediastinal silhouette is normal. Opacity in the left base appears to be largely chronic. The right lung is clear. Stable pacemaker. No other changes. IMPRESSION: No acute change.  Opacity in left base appears to be chronic. Electronically Signed   By: Dorise Bullion III M.D   On: 10/17/2016 12:49   Ct Head Wo Contrast  Result Date: 10/17/2016 CLINICAL DATA:  Altered mental status. EXAM: CT HEAD  WITHOUT CONTRAST TECHNIQUE: Contiguous axial images were obtained from the base of the skull through the vertex without intravenous contrast. COMPARISON:  CT scan of August 13, 2013. FINDINGS: Brain: Moderate diffuse cortical atrophy is noted. Mild chronic ischemic white matter disease is noted. No mass effect or midline shift is noted. Ventricular size is within normal limits. There is no evidence of mass lesion, hemorrhage or acute infarction. Vascular: No hyperdense vessel or unexpected calcification. Skull: Bilateral frontal craniotomies are noted. No acute abnormality is seen in the skull. Sinuses/Orbits: No acute finding. Other: None. IMPRESSION: Moderate diffuse cortical atrophy. Mild chronic ischemic white matter disease. No acute intracranial abnormality seen. Electronically Signed   By: Marijo Conception, M.D.   On: 10/17/2016 12:41     STUDIES:  5/17 CT head >> Moderate diffuse cortical atrophy. Mild chronic ischemic white matter disease. No acute intracranial abnormality seen   CULTURES: BCx2 5/17 > Urine 5/17 >  ANTIBIOTICS: Zosyn 5/17 > Vancomycin 5/17 >  SIGNIFICANT EVENTS: 5/17 admit  LINES/TUBES:   DISCUSSION: 83 year old male with severe dementia.  Presenting with acute decreased LOC. Hypotensive despite fluid started on pressors. Admit to ICU. No clear septic source. Empiric ABX. Full DNR, will allow pressors.   ASSESSMENT / PLAN:  PULMONARY A: At risk airway compromise  P:   Monitor Supplemental O2 Do not intubate  CARDIOVASCULAR A:  Shock unclear etiology. Likely hypovolemic vs septic Atrial fibrillation Pacemaker H/o CAD, HTN  P:  Telemetry monitoring Continued gentle volume DNR if arrests No CVL Ensure lactic clearing  RENAL A:   No acute issues  P:   Continued volume Trend BMP I&O  GASTROINTESTINAL A:   Protein calorie malnutrition  P:   NPO for now IV Protonix for SUP  HEMATOLOGIC A:   Anemia  P:  Follow BMP  INFECTIOUS A:    Possible sepsis. Source unclear. Barely meets SIRS criteria.   P:   Continue empiric Vancomycin and Zosyn Follow cultures PCT  ENDOCRINE A:   No acute issues  P:   Follow glucose on chemistry  NEUROLOGIC A:   Severe dementia Likely some acute metabolic encephalopathy, but unclear to what extent.   P:   RASS goal: 0 Monitor   FAMILY  - Updates: Updated both sons via telephone. They are aware that he has severe chronic disease with now acute disease complicating things. They agree that he would want aggressive medical care with limitations set at the point of life support and heroic measures. Full DNR. Low dose pressors as indicated. If high doses of pressors required or decline should occur they would prefer a palliative approach.  - Inter-disciplinary family meet or Palliative Care meeting due by:  5/24    Georgann Housekeeper, AGACNP-BC [place] [facility]Critical [facility] Pager [phone] or [phone]  10/17/2016 5:53 PM   STAFF NOTE: Linwood Dibbles, MD FACP have personally reviewed patient's available data, including medical history, events of note, physical examination and test results as part of my evaluation. I have discussed with resident/NP and other care providers such as pharmacist, RN and RRT. In addition, I personally evaluated patient and elicited key findings of: not awake, no distress, lungs clear, abdo soft, no rash, neck supple, hypothermia, presents with sepsis appearing, source NOT clear, (has pacer wires, UA not impressive, slight haziness hilums), his sons desrobe terminal now escalating worse dementia, he has voiced his opinion to them in past for No form s of life support, I called both sons and they want ABX, fluids, lab work but no forms of life support, all of this heroic also would be medically futile and ineffective care, DNR, DNI, no pressors, no line, admit to medicine, his MAP is better bolus and wean levophed off from low dose 3-5 mics as he  does not wish and is not needed and wont change outcome, empiric ABX, nosocmial, BC, labs, can tolerate more volume, consider echo, family was clear if he declines they wish comfort meds and not allow him to suffer The patient is critically ill with multiple organ systems failure and requires high complexity decision making for assessment and support, frequent evaluation and titration of therapies, application of advanced monitoring technologies and extensive interpretation of multiple databases.   Critical Care Time devoted to patient care services described in this note is 30 Minutes. This time reflects time of care of this signee: Merrie Roof, MD FACP. This critical care time does not reflect procedure time, or teaching time or supervisory time of PA/NP/Med student/Med Resident etc but could involve care discussion time. Rest per NP/medical resident whose note is  outlined above and that I agree with   Lavon Paganini. Titus Mould, MD, [place] Pgr: [place] [facility] 10/17/2016 6:01 PM

## 2016-10-17 NOTE — ED Notes (Addendum)
Admitting paged again by secretary to clarify pt plan of care.

## 2016-10-17 NOTE — Progress Notes (Signed)
Spoke with son Edward Harvey at bedside.  We reviewed discussion with Dr. Titus Mould.  Son still had impression that "everything would be done", when clarified, this included CPR and life support.  He described to me that patient is wheelchair bound, over last months has been talking more of old deceased friends, and in more recent weeks has stopped making eye contact when son visits.  We reviewed that father would not under any circumstances desire intubation and mechanical ventilation.  We discussed other family, including patient's oldest daughter who died in Hospice recently, under similar circumstances to father.  We discussed CPR/pressors and HIGH likelihood during a CODE situation that patient would lose airway requiring intubation, and that the low likelihood of benefit was outweighed by the extremely high likelihood of harm in CODE/CPR event.  Son confirmed this, able to clearly state to me that CPR/pressors/intubation were a line that should not be crossed, but that fluid resuscitation and continued antibiotics and lab testing were expected.  DNR code status confirmed.  No pressors.  Repeat bolus now.

## 2016-10-17 NOTE — ED Provider Notes (Signed)
[facility] DEPT Provider Note   CSN: [id number] Arrival date & time: 10/17/16  1143     History   Chief Complaint Chief Complaint  Patient presents with  . Altered Mental Status    HPI Edward Harvey is a 83 y.o. male.  HPI Level V caveat due to nonverbal status. History obtained from patient's eldest son. Reportedly decreased mental status today. A sign will speak a little bit but would not provide history even on a good day. Reportedly has been hypoxic and hypoglycemic at the nursing home. No fever. He is fed with assistance. Has had a decreased appetite. Has a history of reported Crohn's disease and has some chronic diarrhea.    Past Medical History:  Diagnosis Date  . A-fib ([place])   . Acute bronchitis   . Acute renal failure (ARF) ([place])   . Anemia   . Antral ulcer 2011  . Atrioventricular block, complete ([place])   . C. difficile colitis   . CAD (coronary artery disease)    nuclear stress test 2006 inferoseptal and apical infarct ejection fraction 48%  . Crohn disease ([place])   . Diverticulosis of colon with hemorrhage   . DJD (degenerative joint disease)   . Gallstone   . Glaucoma   . Gout   . Hematoma    subdural  . Hemorrhage of gastrointestinal tract, unspecified   . Hyperkalemia   . Hypertension   . Inguinal hernia   . Internal hemorrhoids without mention of complication   . Mild dementia   . Osteoarthritis   . Pacemaker   . Prostate cancer [facility])    radiation therapy in 1996/notes 10/29/2001 (05/28/2013)  . Segmental colitis ([place])   . Traumatic subdural hematoma ([facility])   . Unspecified intestinal obstruction     Patient Active Problem List   Diagnosis Date Noted  . Diarrhea 12/30/2014  . History of Clostridium difficile colitis 12/30/2014  . History of prostate cancer 05/03/2014  . Hypernatremia 04/11/2014  . Hypokalemia 04/11/2014  . ARF (acute renal failure) ([place]) 04/10/2014  . SBO (small bowel obstruction) ([place]) 04/09/2014  . Acute renal  failure syndrome ([place])   . Chronic venous insufficiency 11/04/2013  . Edema 11/04/2013  . Acute gastroenteritis 09/12/2013  . Acute on chronic renal failure ([place]) 09/10/2013  . Nausea with vomiting 09/10/2013  . Severe protein-calorie malnutrition ([place]) 09/10/2013  . BPH (benign prostatic hyperplasia) 09/10/2013  . Hypovolemia dehydration 09/10/2013  . Acute renal failure ([place]) 08/11/2013  . Left arm weakness 08/11/2013  . Hypotension 05/26/2013  . H/O prostate cancer 12/08/2012  . Syncope 09/06/2012  . Crohn's colitis ([place]) 06/09/2012  . Renal insufficiency 06/09/2012  . History of subdural hematoma (post traumatic) 06/09/2012  . Gait abnormality 06/09/2012  . Atrial fibrillation ([place]) 03/25/2012  . Senile dementia 03/19/2012  . PPM-[place] 01/14/2011  . Coronary atherosclerosis 06/16/2008  . AV BLOCK, COMPLETE 06/16/2008  . INGUINAL HERNIA 06/16/2008  . Osteoarthritis 06/16/2008  . Essential hypertension 11/26/2007  . CARCINOMA, PROSTATE, HX OF 11/26/2007    Past Surgical History:  Procedure Laterality Date  . BURR HOLE FOR SUBDURAL HEMATOMA     "he's had 4 ORs"/notes 10/29/2001 (05/28/2013)  . CATARACT EXTRACTION, BILATERAL    . COLONOSCOPY  2009   Diverticulosis and Hemorrhoids   . INSERT / REPLACE / [place]  1994; 11/2004   Archie Endo 10/29/2001; generator change/notes 11/23/2004  (05/28/2013)  . LIPOMA EXCISION  2001   left leg  . PACEMAKER GENERATOR CHANGE N/A 08/17/2014   Procedure:  PACEMAKER GENERATOR CHANGE;  Surgeon: Deboraha Sprang, MD;  Location: [facility] CATH LAB;  Service: Cardiovascular;  Laterality: N/A;  . PACEMAKER PLACEMENT    . PERICARDIOCENTESIS  1994   post pacemaker placement/notes 03/12/2005 (05/28/2013)  . SUBTOTAL COLECTOMY  12/03   Dr. Lucia Gaskins  . TONSILLECTOMY AND ADENOIDECTOMY  age 67   03/12/2005 (05/28/2013)       Home Medications    Prior to Admission medications   Medication Sig Start Date End Date Taking? Authorizing Provider    allopurinol (ZYLOPRIM) 300 MG tablet Take 300 mg by mouth every morning.    Yes [provider]  aspirin 81 MG chewable tablet Chew 81 mg by mouth every evening.    Yes [provider]  Cholecalciferol (VITAMIN D) 1000 UNITS capsule Take 1,000 Units by mouth every morning.    Yes [provider]  diphenoxylate-atropine (LOMOTIL) 2.5-0.025 MG per tablet Take 2 tablets by mouth 4 (four) times daily as needed for diarrhea or loose stools.   Yes [provider]  famotidine (PEPCID) 20 MG tablet Take 1 tablet (20 mg total) by mouth 2 (two) times daily. Patient taking differently: Take 20 mg by mouth daily.  04/14/14  Yes Donne Hazel, MD  furosemide (LASIX) 20 MG tablet Take 20 mg by mouth every other day. Hold for SBP less than 100   Yes [provider]  magnesium hydroxide (MILK OF MAGNESIA) 400 MG/5ML suspension Take 30 mLs by mouth daily as needed for mild constipation.   Yes [provider]  Multiple Vitamins-Minerals (CENTRUM SILVER PO) Take 1 tablet by mouth every morning.    Yes [provider]  saccharomyces boulardii (FLORASTOR) 250 MG capsule Take 1 capsule (250 mg total) by mouth 2 (two) times daily. 04/14/14  Yes Donne Hazel, MD  tamsulosin (FLOMAX) 0.4 MG CAPS capsule Take 0.4 mg by mouth daily after supper.   Yes [provider]  Trospium Chloride (SANCTURA XR) 60 MG CP24 Take 60 mg by mouth every evening.    Yes [provider]  vitamin C (ASCORBIC ACID) 500 MG tablet Take 1 tablet (500 mg total) by mouth daily. 08/14/13  Yes Delfina Redwood, MD  mesalamine (PENTASA) 500 MG CR capsule Take 2 capsules (1,000 mg total) by mouth 3 (three) times daily. Patient not taking: Reported on 10/17/2016 09/19/15   Pyrtle, Lajuan Lines, MD    Family History History reviewed. No pertinent family history.  Social History Social History  Substance Use Topics  . Smoking status: Never Smoker  . Smokeless tobacco:  Never Used  . Alcohol use No     Comment: Occ wine      Allergies   Patient has no known allergies.   Review of Systems Review of Systems  Unable to perform ROS: Dementia     Physical Exam Updated Vital Signs BP (!) 81/71   Pulse 70   Temp (S) (!) 94.4 F (34.7 C) (Rectal)   Resp 10   Ht 6' (1.829 m)   Wt 185 lb (83.9 kg)   SpO2 100%   BMI 25.09 kg/m   Physical Exam  Constitutional: He appears well-developed.  HENT:  Head: Atraumatic.  Eyes:  Left pupil is postsurgical. Right pupils somewhat constricted.  Neck: Neck supple.  Cardiovascular: Normal rate.   Patient is electronically paced on monitor.  Pulmonary/Chest: Effort normal.  Abdominal: There is no tenderness.  Musculoskeletal: He exhibits no edema.  Neurological:  Patient sitting in bed with his  eyes closed. Will open to voice. Will say yes or but not really answer questions. appears to move all his extremities.  Skin: Skin is warm. Capillary refill takes less than 2 seconds.     ED Treatments / Results  Labs (all labs ordered are listed, but only abnormal results are displayed) Labs Reviewed  COMPREHENSIVE METABOLIC PANEL - Abnormal; Notable for the following:       Result Value   Chloride 114 (*)    Glucose, Bld 104 (*)    Calcium 8.5 (*)    Total Protein 6.3 (*)    Albumin 2.1 (*)    ALT 16 (*)    GFR calc non Af Amer 58 (*)    All other components within normal limits  TROPONIN I - Abnormal; Notable for the following:    Troponin I 0.04 (*)    All other components within normal limits  CBC WITH DIFFERENTIAL/PLATELET - Abnormal; Notable for the following:    RBC 3.32 (*)    Hemoglobin 10.9 (*)    HCT 31.5 (*)    RDW 17.0 (*)    Platelets 141 (*)    All other components within normal limits  URINALYSIS, ROUTINE W REFLEX MICROSCOPIC - Abnormal; Notable for the following:    Color, Urine AMBER (*)    APPearance HAZY (*)    Hgb urine dipstick SMALL (*)    Leukocytes, UA TRACE (*)     Bacteria, UA MANY (*)    Squamous Epithelial / LPF 0-5 (*)    All other components within normal limits  I-STAT CG4 LACTIC ACID, ED - Abnormal; Notable for the following:    Lactic Acid, Venous 2.68 (*)    All other components within normal limits  CULTURE, BLOOD (ROUTINE X 2)  CULTURE, BLOOD (ROUTINE X 2)  LACTIC ACID, PLASMA  CBG MONITORING, ED    EKG  EKG Interpretation  Date/Time:  Thursday Oct 17 2016 11:52:14 EDT Ventricular Rate:  69 PR Interval:    QRS Duration: 194 QT Interval:  499 QTC Calculation: 535 R Axis:   -86 Text Interpretation:  Ventricular-paced rhythm No further analysis attempted due to paced rhythm Artifact in lead(s) I aVL V1 V2 V3 Confirmed by Alvino Chapel  MD, Roselyne Stalnaker [id number]) on 10/17/2016 4:05:56 PM       Radiology Dg Chest 1 View  Result Date: 10/17/2016 CLINICAL DATA:  Changing consciousness. EXAM: CHEST 1 VIEW COMPARISON:  September 10, 2013 FINDINGS: No pneumothorax. The cardiomediastinal silhouette is normal. Opacity in the left base appears to be largely chronic. The right lung is clear. Stable pacemaker. No other changes. IMPRESSION: No acute change.  Opacity in left base appears to be chronic. Electronically Signed   By: Dorise Bullion III M.D   On: 10/17/2016 12:49   Ct Head Wo Contrast  Result Date: 10/17/2016 CLINICAL DATA:  Altered mental status. EXAM: CT HEAD WITHOUT CONTRAST TECHNIQUE: Contiguous axial images were obtained from the base of the skull through the vertex without intravenous contrast. COMPARISON:  CT scan of August 13, 2013. FINDINGS: Brain: Moderate diffuse cortical atrophy is noted. Mild chronic ischemic white matter disease is noted. No mass effect or midline shift is noted. Ventricular size is within normal limits. There is no evidence of mass lesion, hemorrhage or acute infarction. Vascular: No hyperdense vessel or unexpected calcification. Skull: Bilateral frontal craniotomies are noted. No acute abnormality is seen in the skull.  Sinuses/Orbits: No acute finding. Other: None. IMPRESSION: Moderate diffuse cortical atrophy. Mild chronic ischemic  white matter disease. No acute intracranial abnormality seen. Electronically Signed   By: Marijo Conception, M.D.   On: 10/17/2016 12:41    Procedures Procedures (including critical care time)  Medications Ordered in ED Medications  sodium chloride 0.9 % bolus 1,000 mL (1,000 mLs Intravenous New Bag/Given 10/17/16 1616)  vancomycin (VANCOCIN) 1,250 mg in sodium chloride 0.9 % 250 mL IVPB (not administered)  piperacillin-tazobactam (ZOSYN) IVPB 3.375 g (not administered)  norepinephrine (LEVOPHED) 4 mg in dextrose 5 % 250 mL (0.016 mg/mL) infusion (not administered)  sodium chloride 0.9 % bolus 1,000 mL (not administered)  sodium chloride 0.9 % bolus 500 mL (0 mLs Intravenous Stopped 10/17/16 1319)  piperacillin-tazobactam (ZOSYN) IVPB 3.375 g (0 g Intravenous Stopped 10/17/16 1514)  vancomycin (VANCOCIN) IVPB 1000 mg/200 mL premix (0 mg Intravenous Stopped 10/17/16 1616)  sodium chloride 0.9 % bolus 500 mL (500 mLs Intravenous New Bag/Given 10/17/16 1422)     Initial Impression / Assessment and Plan / ED Course  I have reviewed the triage vital signs and the nursing notes.  Pertinent labs & imaging results that were available during my care of the patient were reviewed by me and considered in my medical decision making (see chart for details).     Patient brought in for mental status changes. Baseline is already is somewhat decreased mental status. Family was at bedside states is not that much off. Will wake up and talk. Was however hypothermic. Placed on bear hugger. Initial hypotension improved with fluids with then decreased again. No clear source of infection. Initial lactic acid was only minimally above 2. Minimally elevated lactic acid could be due to dehydration since he has not been eating and drinking much per his family. No decubitus ulcers. Patient is a full code. Has  not been hypoxic here. Discussed with critical care and police pending change CODE STATUS will start some pressors. Admit to ICU.  Patient reportedly had had episode of hypoxia that was not informed about. Started on a nonrebreather. Patient still has good gag reflex.  CRITICAL CARE Performed by: Mackie Pai Total critical care time: 30 minutes Critical care time was exclusive of separately billable procedures and treating other patients. Critical care was necessary to treat or prevent imminent or life-threatening deterioration. Critical care was time spent personally by me on the following activities: development of treatment plan with patient and/or surrogate as well as nursing, discussions with consultants, evaluation of patient's response to treatment, examination of patient, obtaining history from patient or surrogate, ordering and performing treatments and interventions, ordering and review of laboratory studies, ordering and review of radiographic studies, pulse oximetry and re-evaluation of patient's condition.       Final Clinical Impressions(s) / ED Diagnoses   Final diagnoses:  Hypothermia, initial encounter  Hypotension, unspecified hypotension type    New Prescriptions New Prescriptions   No medications on file     Davonna Belling, MD 10/17/16 [phone]

## 2016-10-17 NOTE — H&P (Signed)
History and Physical  Patient Name: Edward Harvey     TKZ:[id number]    DOB: Dec 29, 1915    DOA: 10/17/2016 PCP: Reynold Bowen, MD   Patient coming from: [facility]  Chief Complaint: Altered LOC  HPI: Edward Harvey is a 83 y.o. male with a past medical history significant for advanced dementia, CHP with pacer, HFpEF, HTN, CAD, CKD stage III, and Crohn's disease and diverticulosis s/p colectomy, who presents with lethargy for 1 day.  The patient is unable to provide his own history, which is supplied by medics via EDP and by the son at the bedside.  Evidently, at baseline, he is wheelchair bound, able to converse, but not oriented to year or situation.  Per son, lately he has failing to make eye contact when his son talks to him frequently.  Today, staff at his facility felt he was less responsive than usual, as well as reportedly hypoxic and hypoglycemic and so transported him to the ER.  Other than diarrhea, which is chronic, there are no other reported localizing symptoms and son knows of none.  ED course: -Hypothermic to 93.33F, heart rate 40s to 80s, respirations 8-23, BP initially 84/64 -Na 144, K 3.9, Cr 1.03 (baseline 1.1), WBC 6.2K, Hgb 10.9 -Troponin 0.04 -Lactic acid 2.68 -Urinalysis showed bacteria and RBCs, no pyuria -CT head unremarkable -Chest x-ray showed a chronic left base opacity -ECG showed paced rhythm -He was given IV fluids, broad spectrum antibiotics and started on Levophed -PCCM was asked to admit but on discussion with both sons at the bedside it became clear that patient had expressed in the past that he would not want to be kept alive on machines and furthermore that given his advanced age and dementia this was futile and ineffective care -It was felt that he may be under-fluid resuscitated and that antibiotics were prudent and so TRH were asked to manage in stepdown for medical management of presumed sepsis, unclear source     ROS: Review of Systems    Unable to perform ROS: Medical condition          Past Medical History:  Diagnosis Date  . A-fib ([place])   . Acute bronchitis   . Acute renal failure (ARF) ([place])   . Anemia   . Antral ulcer 2011  . Atrioventricular block, complete ([place])   . C. difficile colitis   . CAD (coronary artery disease)    nuclear stress test 2006 inferoseptal and apical infarct ejection fraction 48%  . Crohn disease ([place])   . Diverticulosis of colon with hemorrhage   . DJD (degenerative joint disease)   . Gallstone   . Glaucoma   . Gout   . Hematoma    subdural  . Hemorrhage of gastrointestinal tract, unspecified   . Hyperkalemia   . Hypertension   . Inguinal hernia   . Internal hemorrhoids without mention of complication   . Mild dementia   . Osteoarthritis   . Pacemaker   . Prostate cancer [place])    radiation therapy in 1996/notes 10/29/2001 (05/28/2013)  . Segmental colitis ([place])   . Traumatic subdural hematoma ([facility])   . Unspecified intestinal obstruction     Past Surgical History:  Procedure Laterality Date  . BURR HOLE FOR SUBDURAL HEMATOMA     "he's had 4 ORs"/notes 10/29/2001 (05/28/2013)  . CATARACT EXTRACTION, BILATERAL    . COLONOSCOPY  2009   Diverticulosis and Hemorrhoids   . INSERT / REPLACE / [place]  1994; 11/2004   /  notes 10/29/2001; generator change/notes 11/23/2004  (05/28/2013)  . LIPOMA EXCISION  2001   left leg  . PACEMAKER GENERATOR CHANGE N/A 08/17/2014   Procedure: PACEMAKER GENERATOR CHANGE;  Surgeon: Deboraha Sprang, MD;  Location: [facility] CATH LAB;  Service: Cardiovascular;  Laterality: N/A;  . PACEMAKER PLACEMENT    . PERICARDIOCENTESIS  1994   post pacemaker placement/notes 03/12/2005 (05/28/2013)  . SUBTOTAL COLECTOMY  12/03   Dr. Lucia Gaskins  . TONSILLECTOMY AND ADENOIDECTOMY  age 23   03/12/2005 (05/28/2013)    Social History: Patient lives at [facility].  The patient is wheelchair bound at baseline.  He is from [place].  Has two sons.  An oldest  daughter is deceased.  He was not a smoker.  He worked for the [employer].    No Known Allergies  Family history: Unable to obtain due to patient mentation.  Prior to Admission medications   Medication Sig Start Date End Date Taking? Authorizing Provider  allopurinol (ZYLOPRIM) 300 MG tablet Take 300 mg by mouth every morning.    Yes [provider]  aspirin 81 MG chewable tablet Chew 81 mg by mouth every evening.    Yes [provider]  Cholecalciferol (VITAMIN D) 1000 UNITS capsule Take 1,000 Units by mouth every morning.    Yes [provider]  diphenoxylate-atropine (LOMOTIL) 2.5-0.025 MG per tablet Take 2 tablets by mouth 4 (four) times daily as needed for diarrhea or loose stools.   Yes [provider]  famotidine (PEPCID) 20 MG tablet Take 1 tablet (20 mg total) by mouth 2 (two) times daily. Patient taking differently: Take 20 mg by mouth daily.  04/14/14  Yes Donne Hazel, MD  furosemide (LASIX) 20 MG tablet Take 20 mg by mouth every other day. Hold for SBP less than 100   Yes [provider]  magnesium hydroxide (MILK OF MAGNESIA) 400 MG/5ML suspension Take 30 mLs by mouth daily as needed for mild constipation.   Yes [provider]  Multiple Vitamins-Minerals (CENTRUM SILVER PO) Take 1 tablet by mouth every morning.    Yes [provider]  saccharomyces boulardii (FLORASTOR) 250 MG capsule Take 1 capsule (250 mg total) by mouth 2 (two) times daily. 04/14/14  Yes Donne Hazel, MD  tamsulosin (FLOMAX) 0.4 MG CAPS capsule Take 0.4 mg by mouth daily after supper.   Yes [provider]  Trospium Chloride (SANCTURA XR) 60 MG CP24 Take 60 mg by mouth every evening.    Yes [provider]  vitamin C (ASCORBIC ACID) 500 MG tablet Take 1 tablet (500 mg total) by mouth daily. 08/14/13  Yes Delfina Redwood, MD       Physical Exam: BP (!) 113/58 (BP Location: Left Arm)   Pulse (!) 56   Temp 97.8 F  (36.6 C) (Axillary)   Resp (!) 9   Ht 5\' 8"  (1.727 m)   Wt 68 kg (149 lb 14.6 oz)   SpO2 100%   BMI 22.79 kg/m  General appearance: Frail elderly adult male, obtunded.  Does not respond to noxious stimuli.   Eyes: Anicteric, conjunctiva pink, lids and lashes normal. PERRL.    ENT: No nasal deformity, discharge, epistaxis. Lips dry, does not open mouth. Neck: No neck masses.  Trachea midline.   Lymph: No cervical or supraclavicular lymphadenopathy. Skin: Warm and dry.   No suspicious rashes or lesions. Cardiac: Slow, nl S1-S2, no murmurs appreciated.  Capillary refill is brisk.  JVP not visible.  No LE  edema.  Radial and BP pulses diminished pulses 2+ and symmetric. Respiratory: Respiratory rate and rhythm appear normal.  Do not appreciate rales Abdomen: Abdomen soft.  Scaphoid.  No obvious grimace to palpation. No ascites, distension, hepatosplenomegaly.   MSK: No deformities or effusions.  No cyanosis or clubbing.  Dioffuse loss of muscle mass and subQ fat. Neuro: Unable to assss, does not follow commands.  Some spontaneous movements of left hand.  PERRL. Psych: Unable to assess.     Labs on Admission:  I have personally reviewed following labs and imaging studies: CBC:  Recent Labs Lab 10/17/16 1319  WBC 6.2  NEUTROABS 4.6  HGB 10.9*  HCT 31.5*  MCV 94.9  PLT 333*   Basic Metabolic Panel:  Recent Labs Lab 10/17/16 1319  NA 144  K 3.9  CL 114*  CO2 23  GLUCOSE 104*  BUN 10  CREATININE 1.03  CALCIUM 8.5*   GFR: Estimated Creatinine Clearance: 37.6 mL/min (by C-G formula based on SCr of 1.03 mg/dL).  Liver Function Tests:  Recent Labs Lab 10/17/16 1319  AST 24  ALT 16*  ALKPHOS 82  BILITOT 0.7  PROT 6.3*  ALBUMIN 2.1*   No results for input(s): LIPASE, AMYLASE in the last 168 hours. No results for input(s): AMMONIA in the last 168 hours. Coagulation Profile: No results for input(s): INR, PROTIME in the last 168 hours. Cardiac Enzymes:  Recent  Labs Lab 10/17/16 1319  TROPONINI 0.04*   BNP (last 3 results) No results for input(s): PROBNP in the last 8760 hours. HbA1C: No results for input(s): HGBA1C in the last 72 hours. CBG:  Recent Labs Lab 10/17/16 1256  GLUCAP 87   Lipid Profile: No results for input(s): CHOL, HDL, LDLCALC, TRIG, CHOLHDL, LDLDIRECT in the last 72 hours. Thyroid Function Tests: No results for input(s): TSH, T4TOTAL, FREET4, T3FREE, THYROIDAB in the last 72 hours. Anemia Panel: No results for input(s): VITAMINB12, FOLATE, FERRITIN, TIBC, IRON, RETICCTPCT in the last 72 hours. Sepsis Labs: Lactic acid 2.68 Invalid input(s): PROCALCITONIN, LACTICIDVEN No results found for this or any previous visit (from the past 240 hour(s)).       Radiological Exams on Admission: Personally reviewed CXR shows old opacities, perihilar trace opacities; CT head report reviewed: Dg Chest 1 View  Result Date: 10/17/2016 CLINICAL DATA:  Changing consciousness. EXAM: CHEST 1 VIEW COMPARISON:  September 10, 2013 FINDINGS: No pneumothorax. The cardiomediastinal silhouette is normal. Opacity in the left base appears to be largely chronic. The right lung is clear. Stable pacemaker. No other changes. IMPRESSION: No acute change.  Opacity in left base appears to be chronic. Electronically Signed   By: Dorise Bullion III M.D   On: 10/17/2016 12:49   Ct Head Wo Contrast  Result Date: 10/17/2016 CLINICAL DATA:  Altered mental status. EXAM: CT HEAD WITHOUT CONTRAST TECHNIQUE: Contiguous axial images were obtained from the base of the skull through the vertex without intravenous contrast. COMPARISON:  CT scan of August 13, 2013. FINDINGS: Brain: Moderate diffuse cortical atrophy is noted. Mild chronic ischemic white matter disease is noted. No mass effect or midline shift is noted. Ventricular size is within normal limits. There is no evidence of mass lesion, hemorrhage or acute infarction. Vascular: No hyperdense vessel or unexpected  calcification. Skull: Bilateral frontal craniotomies are noted. No acute abnormality is seen in the skull. Sinuses/Orbits: No acute finding. Other: None. IMPRESSION: Moderate diffuse cortical atrophy. Mild chronic ischemic white matter disease. No acute intracranial abnormality seen. Electronically Signed  By: Marijo Conception, M.D.   On: 10/17/2016 12:41    EKG: Independently reviewed. Rate 69, paced rhythm.           Assessment/Plan  1. Shock, presumed septic:  Presents hypothermic, hypotensive, bradycardic, and apneic and with elevated lactic acid.  Septic shock versus dehydration/failure to thrive.  Stroke doubted, but neurological exam limited.   -IV vancomycin and Zosyn ordered -Follow blood and urine cultures -Fluid bolus administered in ER -Consult Palliative Care   2. Hypertension and hx of CAD:  -Hold furosemide while giving fluids  3. Normocytic anemia:  Stable, old.    4. Dementia:  This is advanced.  Per son, oriented to self only at baseline.  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    5. Atrial fibrillation:  CHADS2Vasc 5.  Not on anticoagulation.  6. History of Crohn's colitis:  Quiescent.  7. Other medications:  -Hold tamsulosin, allopurinol, H2RA until more alert           DVT prophylaxis: Lovenox  Code Status: DO NOT RESUSCITATE, do not escalate care  Family Communication: Oldest son at bedside  Disposition Plan: Anticipate IV fluids, antibiotics and close hemodynamic monitoring Consults called: PCCM Admission status: INPATIENT         Medical decision making: Patient seen at 7:20 PM on 10/17/2016.  The patient was discussed with Georgann Housekeeper, NP.  What exists of the patient's chart was reviewed in depth and summarized above.  Clinical condition: poor, likely will expire in the hospital during this hospitalization or shortly after.        Edwin Dada [facility] Pager [phone]

## 2016-10-17 NOTE — ED Notes (Signed)
Increased temperature on bair hugger to 43 degrees C.

## 2016-10-17 NOTE — ED Notes (Signed)
Pt son and Dr. Loleta Books at bedside.

## 2016-10-17 NOTE — ED Notes (Signed)
Spoke with pt's son, encouraged family to return to pt bedside. Per family, one of pt's son will return to the hospital in approx 45-60 mins.

## 2016-10-17 NOTE — ED Notes (Signed)
ED Provider at bedside. 

## 2016-10-17 NOTE — ED Notes (Signed)
[place], MD at bedside. 

## 2016-10-17 NOTE — ED Notes (Signed)
Per Dr. Alvino Chapel, critical care to come assess pt. Attempted to contact family x 2. Will continue to call in an attempt to have family consult with the intensivist on assessment.

## 2016-10-17 NOTE — ED Notes (Signed)
Intensivists at bedside.

## 2016-10-17 NOTE — Progress Notes (Addendum)
Pharmacy Antibiotic Note  Edward Harvey is a 83 y.o. male admitted on 10/17/2016 with sepsis.  Pharmacy has been consulted for vancomycin and Zosyn dosing.  Patient is hypothermic, WBC wnl, CrCl ~ 42 ml/min  Plan: Vancomycin 1250 mg IV every 24 hours.  Goal trough 15-20 mcg/mL. Zosyn 3.375g IV q8h (4 hour infusion).  Monitor renal function, cultures, ability to de-escalate Obtain trough at steady state    Temp (24hrs), Avg:94.9 F (34.9 C), Min:93.6 F (34.2 C), Max:96.1 F (35.6 C)   Recent Labs Lab 10/17/16 1319  WBC 6.2    CrCl cannot be calculated (Patient's most recent lab result is older than the maximum 21 days allowed.).    No Known Allergies  Antimicrobials this admission: 5/17 vanc >>  5/17 Zosyn >>   Dose adjustments this admission: n/a  Microbiology results: 5/17 BCx: sent  Thank you for allowing pharmacy to be a part of this patient's care.    Gwenlyn Perking, PharmD PGY1 Pharmacy Resident Rx ED [phone] 10/17/2016 2:42 PM

## 2016-10-17 NOTE — ED Notes (Signed)
Admitting paged 

## 2016-10-17 NOTE — Progress Notes (Signed)
Admitted to 4 N room 10. Patient only saying few words, when uncovered, and hitting at staff. Assessment as noted. Right chest pacemaker, DDD, atrial and ventricular spikes noted. Initial blood pressure in right arm. 67/38 left arm is 113/58.  Kept bp cuff on left arm, patient  Pulses good, faint in DP biilaterally.  Favors  Left side resistant to care and turning. Placed 32 mm external condom cath on for accurate I&O.

## 2016-10-17 NOTE — ED Notes (Addendum)
Per intensivist, spoke with both of pt's son regarding pt code status. Pt levophed to be d/c, pt to receive fluid and abx for treatment. EDP aware of plan of care. Pt weaned off levophed.

## 2016-10-17 NOTE — ED Notes (Signed)
Dr. Alvino Chapel aware of pt's BP.

## 2016-10-17 NOTE — ED Triage Notes (Signed)
Pt sent from SNF today forALOC that was diffrent from normal. Pt base line per family is confused and speaks in single word answer. EMS reported CBG was 67 ,1/2 dextrose given IN and recheck CBG 156. BP 110/90 . Pt can be combative .

## 2016-10-18 ENCOUNTER — Inpatient Hospital Stay (HOSPITAL_COMMUNITY): Payer: Medicare Other

## 2016-10-18 DIAGNOSIS — I481 Persistent atrial fibrillation: Secondary | ICD-10-CM

## 2016-10-18 DIAGNOSIS — Z515 Encounter for palliative care: Secondary | ICD-10-CM

## 2016-10-18 LAB — CBC
HCT: 29.3 % — ABNORMAL LOW (ref 39.0–52.0)
Hemoglobin: 9.9 g/dL — ABNORMAL LOW (ref 13.0–17.0)
MCH: 32.6 pg (ref 26.0–34.0)
MCHC: 33.8 g/dL (ref 30.0–36.0)
MCV: 96.4 fL (ref 78.0–100.0)
PLATELETS: 126 10*3/uL — AB (ref 150–400)
RBC: 3.04 MIL/uL — AB (ref 4.22–5.81)
RDW: 17.4 % — ABNORMAL HIGH (ref 11.5–15.5)
WBC: 5.8 10*3/uL (ref 4.0–10.5)

## 2016-10-18 LAB — MAGNESIUM: MAGNESIUM: 1.4 mg/dL — AB (ref 1.7–2.4)

## 2016-10-18 LAB — BASIC METABOLIC PANEL
Anion gap: 8 (ref 5–15)
BUN: 9 mg/dL (ref 6–20)
CO2: 18 mmol/L — ABNORMAL LOW (ref 22–32)
CREATININE: 0.97 mg/dL (ref 0.61–1.24)
Calcium: 7.8 mg/dL — ABNORMAL LOW (ref 8.9–10.3)
Chloride: 117 mmol/L — ABNORMAL HIGH (ref 101–111)
Glucose, Bld: 61 mg/dL — ABNORMAL LOW (ref 65–99)
Potassium: 3.6 mmol/L (ref 3.5–5.1)
SODIUM: 143 mmol/L (ref 135–145)

## 2016-10-18 LAB — PHOSPHORUS: PHOSPHORUS: 2.9 mg/dL (ref 2.5–4.6)

## 2016-10-18 LAB — LACTIC ACID, PLASMA
Lactic Acid, Venous: 1.6 mmol/L (ref 0.5–1.9)
Lactic Acid, Venous: 2.5 mmol/L (ref 0.5–1.9)

## 2016-10-18 MED ORDER — MAGNESIUM SULFATE 2 GM/50ML IV SOLN
2.0000 g | Freq: Once | INTRAVENOUS | Status: AC
  Administered 2016-10-18: 2 g via INTRAVENOUS
  Filled 2016-10-18: qty 50

## 2016-10-18 MED ORDER — FAMOTIDINE 20 MG PO TABS
20.0000 mg | ORAL_TABLET | Freq: Every day | ORAL | Status: DC
  Administered 2016-10-19 – 2016-10-23 (×5): 20 mg via ORAL
  Filled 2016-10-18 (×5): qty 1

## 2016-10-18 MED ORDER — ALLOPURINOL 300 MG PO TABS
300.0000 mg | ORAL_TABLET | Freq: Every morning | ORAL | Status: DC
  Administered 2016-10-19 – 2016-10-23 (×5): 300 mg via ORAL
  Filled 2016-10-18 (×5): qty 1

## 2016-10-18 MED ORDER — ASPIRIN 81 MG PO CHEW
81.0000 mg | CHEWABLE_TABLET | Freq: Every evening | ORAL | Status: DC
  Administered 2016-10-18 – 2016-10-23 (×6): 81 mg via ORAL
  Filled 2016-10-18 (×6): qty 1

## 2016-10-18 MED ORDER — SACCHAROMYCES BOULARDII 250 MG PO CAPS
250.0000 mg | ORAL_CAPSULE | Freq: Two times a day (BID) | ORAL | Status: DC
  Administered 2016-10-18 – 2016-10-23 (×10): 250 mg via ORAL
  Filled 2016-10-18 (×10): qty 1

## 2016-10-18 MED ORDER — TAMSULOSIN HCL 0.4 MG PO CAPS
0.4000 mg | ORAL_CAPSULE | Freq: Every day | ORAL | Status: DC
  Administered 2016-10-19 – 2016-10-23 (×4): 0.4 mg via ORAL
  Filled 2016-10-18 (×4): qty 1

## 2016-10-18 MED ORDER — POTASSIUM CHLORIDE IN NACL 20-0.9 MEQ/L-% IV SOLN
INTRAVENOUS | Status: DC
  Administered 2016-10-18 – 2016-10-23 (×6): via INTRAVENOUS
  Filled 2016-10-18 (×8): qty 1000

## 2016-10-18 MED ORDER — SODIUM CHLORIDE 0.9 % IV BOLUS (SEPSIS)
500.0000 mL | Freq: Once | INTRAVENOUS | Status: AC
Start: 2016-10-18 — End: 2016-10-18
  Administered 2016-10-18: 500 mL via INTRAVENOUS

## 2016-10-18 NOTE — Progress Notes (Addendum)
[facility] PROGRESS NOTE  Edward CORTER Harvey:[id number] DOB: 1916/02/24 DOA: 10/17/2016   PCP: Reynold Bowen, MD     Assessment/Plan: Principal Problem:   Shock [facility]) Active Problems:   Essential hypertension   Coronary atherosclerosis   Senile dementia   Atrial fibrillation ([place])   Crohn's colitis ([place])   Normocytic anemia    83 y.o. male with a past medical history significant for advanced dementia, CHP with pacer, HFpEF, HTN, CAD, CKD stage III, and Crohn's disease and diverticulosis s/p colectomy, who presents with lethargy for 1 day. Initially found to be hypotensive requiring Levophed. Subsequently taken off vasopressors. Admitted to step down for fluid resuscitation and antibiotics   Assessment and plan 1. Shock, presumed septic:  Presents hypothermic, hypotensive, bradycardic, and apneic and with elevated lactic acid.  Septic shock versus dehydration/failure to thrive.  Stroke doubted, but neurological exam limited.   -IV vancomycin and Zosyn , day #2 -Follow blood and urine cultures Chest x-ray negative on admission, UA negative -Fluid bolus administered in ER Palliative care consultation for goals of care Currently on aspiration precautions, dysphagia 1 diet recommended by speech therapy Repeat chest x-ray 5/18 positive for  pneumonia,     2. Hypertension and hx of CAD:  Continue aspirin, Holding furosemide    3. Normocytic anemia:  Stable, old.    4. Dementia:  This is advanced.  Per son, oriented to self only at baseline.  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    5. Atrial fibrillation:  CHADS2Vasc 5.  Not on anticoagulation.  6. History of Crohn's colitis:  Quiescent.  7. Other medications:  Resume tamsulosin, allopurinol,     DVT prophylaxsis heparin  Code Status:  DNR    Family Communication: Discussed in detail with the patient, all imaging results, lab results explained to the patient   Disposition Plan:  Anticipate discharge  in 1-2 days      Consultants:   None  Procedures:  None  Antibiotics: Anti-infectives    Start     Dose/Rate Route Frequency Ordered Stop   10/18/16 1400  vancomycin (VANCOCIN) 1,250 mg in sodium chloride 0.9 % 250 mL IVPB     1,250 mg 166.7 mL/hr over 90 Minutes Intravenous Every 24 hours 10/17/16 1616     10/17/16 2200  piperacillin-tazobactam (ZOSYN) IVPB 3.375 g     3.375 g 12.5 mL/hr over 240 Minutes Intravenous Every 8 hours 10/17/16 1616     10/17/16 1415  piperacillin-tazobactam (ZOSYN) IVPB 3.375 g     3.375 g 100 mL/hr over 30 Minutes Intravenous  Once 10/17/16 1401 10/17/16 1514   10/17/16 1415  vancomycin (VANCOCIN) IVPB 1000 mg/200 mL premix     1,000 mg 200 mL/hr over 60 Minutes Intravenous  Once 10/17/16 1401 10/17/16 1616         HPI/Subjective: Signed in by the bedside and states that the patient is more awake and alert today, he has been having aspiration problems with the last month  Objective: Vitals:   10/18/16 0345 10/18/16 0430 10/18/16 0500 10/18/16 0809  BP:   101/65 112/69  Pulse: (!) 58 80 69 (!) 59  Resp: 10 14 16 13   Temp:   98.7 F (37.1 C)   TempSrc:   Axillary Oral  SpO2: 100% 96% 100% 100%  Weight:      Height:        Intake/Output Summary (Last 24 hours) at 10/18/16 1124 Last data filed at 10/17/16 2039  Gross per  24 hour  Intake             4248 ml  Output                0 ml  Net             4248 ml    Exam:  General appearance: Frail elderly adult male, obtunded.  Does not respond to noxious stimuli.   Eyes: Anicteric, conjunctiva pink, lids and lashes normal. PERRL.    akin: Warm and dry.   No suspicious rashes or lesions. Cardiac: Slow, nl S1-S2, no murmurs appreciated.  Capillary refill is brisk.  JVP not visible.  No LE edema.  Radial and BP pulses diminished pulses 2+ and symmetric. Respiratory: Respiratory rate and rhythm appear normal.  Do not appreciate rales Abdomen: Abdomen soft.  Scaphoid.  No obvious  grimace to palpation. No ascites, distension, hepatosplenomegaly.     Data Reviewed: I have personally reviewed following labs and imaging studies  Micro Results Recent Results (from the past 240 hour(s))  MRSA PCR Screening     Status: None   Collection Time: 10/17/16  9:34 PM  Result Value Ref Range Status   MRSA by PCR NEGATIVE NEGATIVE Final    Comment:        The GeneXpert MRSA Assay (FDA approved for NASAL specimens only), is one component of a comprehensive MRSA colonization surveillance program. It is not intended to diagnose MRSA infection nor to guide or monitor treatment for MRSA infections.     Radiology Reports Dg Chest 1 View  Result Date: 10/17/2016 CLINICAL DATA:  Changing consciousness. EXAM: CHEST 1 VIEW COMPARISON:  September 10, 2013 FINDINGS: No pneumothorax. The cardiomediastinal silhouette is normal. Opacity in the left base appears to be largely chronic. The right lung is clear. Stable pacemaker. No other changes. IMPRESSION: No acute change.  Opacity in left base appears to be chronic. Electronically Signed   By: Dorise Bullion III M.D   On: 10/17/2016 12:49   Ct Head Wo Contrast  Result Date: 10/17/2016 CLINICAL DATA:  Altered mental status. EXAM: CT HEAD WITHOUT CONTRAST TECHNIQUE: Contiguous axial images were obtained from the base of the skull through the vertex without intravenous contrast. COMPARISON:  CT scan of August 13, 2013. FINDINGS: Brain: Moderate diffuse cortical atrophy is noted. Mild chronic ischemic white matter disease is noted. No mass effect or midline shift is noted. Ventricular size is within normal limits. There is no evidence of mass lesion, hemorrhage or acute infarction. Vascular: No hyperdense vessel or unexpected calcification. Skull: Bilateral frontal craniotomies are noted. No acute abnormality is seen in the skull. Sinuses/Orbits: No acute finding. Other: None. IMPRESSION: Moderate diffuse cortical atrophy. Mild chronic ischemic  white matter disease. No acute intracranial abnormality seen. Electronically Signed   By: Marijo Conception, M.D.   On: 10/17/2016 12:41     CBC  Recent Labs Lab 10/17/16 1319 10/18/16 0245  WBC 6.2 5.8  HGB 10.9* 9.9*  HCT 31.5* 29.3*  PLT 141* 126*  MCV 94.9 96.4  MCH 32.8 32.6  MCHC 34.6 33.8  RDW 17.0* 17.4*  LYMPHSABS 1.2  --   MONOABS 0.4  --   EOSABS 0.1  --   BASOSABS 0.0  --     Chemistries   Recent Labs Lab 10/17/16 1319 10/18/16 0245  NA 144 143  K 3.9 3.6  CL 114* 117*  CO2 23 18*  GLUCOSE 104* 61*  BUN 10 9  CREATININE 1.03  0.97  CALCIUM 8.5* 7.8*  MG  --  1.4*  AST 24  --   ALT 16*  --   ALKPHOS 82  --   BILITOT 0.7  --    ------------------------------------------------------------------------------------------------------------------ estimated creatinine clearance is 40.2 mL/min (by C-G formula based on SCr of 0.97 mg/dL). ------------------------------------------------------------------------------------------------------------------ No results for input(s): HGBA1C in the last 72 hours. ------------------------------------------------------------------------------------------------------------------ No results for input(s): CHOL, HDL, LDLCALC, TRIG, CHOLHDL, LDLDIRECT in the last 72 hours. ------------------------------------------------------------------------------------------------------------------ No results for input(s): TSH, T4TOTAL, T3FREE, THYROIDAB in the last 72 hours.  Invalid input(s): FREET3 ------------------------------------------------------------------------------------------------------------------ No results for input(s): VITAMINB12, FOLATE, FERRITIN, TIBC, IRON, RETICCTPCT in the last 72 hours.  Coagulation profile No results for input(s): INR, PROTIME in the last 168 hours.  No results for input(s): DDIMER in the last 72 hours.  Cardiac Enzymes  Recent Labs Lab 10/17/16 1319  TROPONINI 0.04*    ------------------------------------------------------------------------------------------------------------------ Invalid input(s): POCBNP   CBG:  Recent Labs Lab 10/17/16 1256  GLUCAP 87       Studies: Dg Chest 1 View  Result Date: 10/17/2016 CLINICAL DATA:  Changing consciousness. EXAM: CHEST 1 VIEW COMPARISON:  September 10, 2013 FINDINGS: No pneumothorax. The cardiomediastinal silhouette is normal. Opacity in the left base appears to be largely chronic. The right lung is clear. Stable pacemaker. No other changes. IMPRESSION: No acute change.  Opacity in left base appears to be chronic. Electronically Signed   By: Dorise Bullion III M.D   On: 10/17/2016 12:49   Ct Head Wo Contrast  Result Date: 10/17/2016 CLINICAL DATA:  Altered mental status. EXAM: CT HEAD WITHOUT CONTRAST TECHNIQUE: Contiguous axial images were obtained from the base of the skull through the vertex without intravenous contrast. COMPARISON:  CT scan of August 13, 2013. FINDINGS: Brain: Moderate diffuse cortical atrophy is noted. Mild chronic ischemic white matter disease is noted. No mass effect or midline shift is noted. Ventricular size is within normal limits. There is no evidence of mass lesion, hemorrhage or acute infarction. Vascular: No hyperdense vessel or unexpected calcification. Skull: Bilateral frontal craniotomies are noted. No acute abnormality is seen in the skull. Sinuses/Orbits: No acute finding. Other: None. IMPRESSION: Moderate diffuse cortical atrophy. Mild chronic ischemic white matter disease. No acute intracranial abnormality seen. Electronically Signed   By: Marijo Conception, M.D.   On: 10/17/2016 12:41      Lab Results  Component Value Date   HGBA1C 5.2 04/06/2010   Lab Results  Component Value Date   [facility]  07/23/2009    33        Total Cholesterol/HDL:CHD Risk Coronary Heart Disease Risk Table                     Men   Women  1/2 Average Risk   3.4   3.3  Average Risk       5.0    4.4  2 X Average Risk   9.6   7.1  3 X Average Risk  23.4   11.0        Use the calculated Patient Ratio above and the CHD Risk Table to determine the patient's CHD Risk.        ATP III CLASSIFICATION (LDL):  <100     mg/dL   Optimal  100-129  mg/dL   Near or Above                    Optimal  130-159  mg/dL   Borderline  160-189  mg/dL   High  >190     mg/dL   Very High   CREATININE 0.97 10/18/2016       Scheduled Meds: . heparin  5,000 Units Subcutaneous Q8H   Continuous Infusions: . sodium chloride 100 mL/hr at 10/18/16 0523  . piperacillin-tazobactam (ZOSYN)  IV Stopped (10/18/16 1610)  . vancomycin       LOS: 1 day    Time spent: >30 MINS    Reyne Dumas  [facility] Pager [phone]. If 7PM-7AM, please contact night-coverage at [email or web address], password [email or web address] 10/18/2016, 11:24 AM  LOS: 1 day

## 2016-10-18 NOTE — Progress Notes (Signed)
End of shift note- patient continues to be combative when "bothered" to do any procedure or assessment. Calms and goes back to eyes closed as soon as staff is done with assessments. No urine output, Bladder scan revealed > 200 ml, NP aware, orders received for IV bolus as documented.  Moving in bed, turning and repositioning, would not let staff provide mouth care.

## 2016-10-18 NOTE — Progress Notes (Signed)
7 beat run of VT or afib with bbb, NP aware of this and Mg level of 1.4, K level of 3.6

## 2016-10-18 NOTE — Consult Note (Signed)
Consultation Note Date: 10/18/2016   Patient Name: Edward Harvey  DOB: 1916/05/07  MRN: [id number]  Age / Sex: 83 y.o., male  PCP: Reynold Bowen, MD Referring Physician: Edwin Dada, *  Reason for Consultation: Establishing goals of care and Psychosocial/spiritual support  HPI/Patient Profile: 83 y.o. male  with past medical history of Atrial fib, chronic kidney disease stage III, anemia, atrioventricular block, C. difficile, Crohn's disease with colectomy, glaucoma, GI bleed, dementia, prostate cancer, pacemaker, congestive heart failure with preserved ejection fraction, admitted on 10/17/2016 with altered mental status, lethargy. Lactic acid on admission was 2.68, troponin 0.04, urinalysis showed bacteria and red blood cells but no pyuria; CT of the head was unremarkable. Chest x-ray showed a chronic left base opacity. Patient was given IV fluids and started on broad-spectrum and is biotics as well as levophed.   Clinical Assessment and Goals of Care: Patient has dementia and clinical assessment, and goals of care were gleaned from chart review as well as discussion with his son, Production manager, at the bedside. Mr. Raider had spoken to critical care medicine and goals were determined to be DO NOT RESUSCITATE no further pressors but to continue fluid resuscitation and antibiotics with the goal to return to the nursing home. Per his son he is more alert today. He does recognize his son and call him by name; he inquires after his son's wife and is able to recall her name. During my assessment I was able to visualize patient working with the speech therapist and was cooperative and able to follow simple commands. Son states "every year I pray for another year so were hoping for 83 years old".  NEXT OF KIN his 2 sons, [address] Jr[address] and [employer] Pt's daughter died 2 months ago    SUMMARY OF  RECOMMENDATIONS   DNR/DNI No PEG tube Family's goal is to return to the nursing home where he resides They will return to the hospital for treatment if indicated  Code Status/Advance Care Planning:  DNR   Palliative Prophylaxis:   Aspiration, Bowel Regimen, Delirium Protocol, Frequent Pain Assessment, Oral Care and Turn Reposition  Additional Recommendations (Limitations, Scope, Preferences):  No Artificial Feeding, No Chemotherapy, No Hemodialysis, No Radiation, No Surgical Procedures and No Tracheostomy  Psycho-social/Spiritual:   Desire for further Chaplaincy support:no  Prognosis:   < 6 months in the setting of advanced dementia, high risk for aspiration pneumonia secondary to debility, protein calorie malnutrition with an albumin of 2.1  Discharge Planning: [place] for rehab with Palliative care service follow-up      Primary Diagnoses: Present on Admission: . Shock ([place]) . Essential hypertension . Coronary atherosclerosis . Atrial fibrillation ([place]) . Senile dementia . Crohn's colitis ([place]) . Normocytic anemia   I have reviewed the medical record, interviewed the patient and family, and examined the patient. The following aspects are pertinent.  Past Medical History:  Diagnosis Date  . A-fib ([place])   . Acute bronchitis   . Acute renal failure (ARF) ([place])   .  Anemia   . Antral ulcer 2011  . Atrioventricular block, complete ([place])   . C. difficile colitis   . CAD (coronary artery disease)    nuclear stress test 2006 inferoseptal and apical infarct ejection fraction 48%  . Crohn disease ([place])   . Diverticulosis of colon with hemorrhage   . DJD (degenerative joint disease)   . Gallstone   . Glaucoma   . Gout   . Hematoma    subdural  . Hemorrhage of gastrointestinal tract, unspecified   . Hyperkalemia   . Hypertension   . Inguinal hernia   . Internal hemorrhoids without mention of complication   . Mild dementia   . Osteoarthritis     . Pacemaker   . Prostate cancer [facility])    radiation therapy in 1996/notes 10/29/2001 (05/28/2013)  . Segmental colitis ([place])   . Traumatic subdural hematoma ([facility])   . Unspecified intestinal obstruction    Social History   Social History  . Marital status: Widowed    Spouse name: N/A  . Number of children: 4  . Years of education: N/A   Occupational History  . Retired    Social History Main Topics  . Smoking status: Never Smoker  . Smokeless tobacco: Never Used  . Alcohol use No     Comment: Occ wine   . Drug use: No  . Sexual activity: Not Asked   Other Topics Concern  . None   Social History Narrative  . None   History reviewed. No pertinent family history. Scheduled Meds: . heparin  5,000 Units Subcutaneous Q8H   Continuous Infusions: . 0.9 % NaCl with KCl 20 mEq / L 75 mL/hr at 10/18/16 1148  . piperacillin-tazobactam (ZOSYN)  IV 3.375 g (10/18/16 1450)  . vancomycin Stopped (10/18/16 1620)   PRN Meds:.acetaminophen **OR** acetaminophen, ondansetron **OR** ondansetron (ZOFRAN) IV Medications Prior to Admission:  Prior to Admission medications   Medication Sig Start Date End Date Taking? Authorizing Provider  allopurinol (ZYLOPRIM) 300 MG tablet Take 300 mg by mouth every morning.    Yes [provider]  aspirin 81 MG chewable tablet Chew 81 mg by mouth every evening.    Yes [provider]  Cholecalciferol (VITAMIN D) 1000 UNITS capsule Take 1,000 Units by mouth every morning.    Yes [provider]  diphenoxylate-atropine (LOMOTIL) 2.5-0.025 MG per tablet Take 2 tablets by mouth 4 (four) times daily as needed for diarrhea or loose stools.   Yes [provider]  famotidine (PEPCID) 20 MG tablet Take 1 tablet (20 mg total) by mouth 2 (two) times daily. Patient taking differently: Take 20 mg by mouth daily.  04/14/14  Yes Donne Hazel, MD  furosemide (LASIX) 20 MG tablet Take 20 mg by mouth every other day. Hold for SBP less  than 100   Yes [provider]  magnesium hydroxide (MILK OF MAGNESIA) 400 MG/5ML suspension Take 30 mLs by mouth daily as needed for mild constipation.   Yes [provider]  Multiple Vitamins-Minerals (CENTRUM SILVER PO) Take 1 tablet by mouth every morning.    Yes [provider]  saccharomyces boulardii (FLORASTOR) 250 MG capsule Take 1 capsule (250 mg total) by mouth 2 (two) times daily. 04/14/14  Yes Donne Hazel, MD  tamsulosin (FLOMAX) 0.4 MG CAPS capsule Take 0.4 mg by mouth daily after supper.   Yes [provider]  Trospium Chloride (SANCTURA XR) 60 MG CP24 Take 60 mg by mouth every evening.  Yes [provider]  vitamin C (ASCORBIC ACID) 500 MG tablet Take 1 tablet (500 mg total) by mouth daily. 08/14/13  Yes Delfina Redwood, MD   No Known Allergies Review of Systems  Unable to perform ROS: Dementia    Physical Exam  Constitutional:  Cachectic, frail elderly man  HENT:  Temporal wasting  Eyes:  Periorbital edema  Neck: Normal range of motion.  Cardiovascular:  Rate 59, diminished radial pulses  Pulmonary/Chest: Effort normal.  Neurological: He is alert.  Recognized his son and called him by name; asked about his wife and knew her name is Lelon Frohlich   Skin: Skin is warm and dry.  Psychiatric:  Unable to test. Patient has intermittently been resistant to care but observe patient working with speech therapy and follow instructions without agitation  Nursing note and vitals reviewed.   Vital Signs: BP 103/65 (BP Location: Left Arm)   Pulse 60   Temp (!) 96 F (35.6 C) (Axillary)   Resp 14   Ht 5\' 8"  (1.727 m)   Wt 71 kg (156 lb 8.4 oz)   SpO2 100%   BMI 23.80 kg/m  Pain Assessment: No/denies pain       SpO2: SpO2: 100 % O2 Device:SpO2: 100 % O2 Flow Rate: .O2 Flow Rate (L/min): 10 L/min  IO: Intake/output summary:  Intake/Output Summary (Last 24 hours) at 10/18/16 1619 Last data filed at 10/17/16 2039  Gross  per 24 hour  Intake             3498 ml  Output                0 ml  Net             3498 ml    LBM:   Baseline Weight: Weight: 83.9 kg (185 lb) Most recent weight: Weight: 71 kg (156 lb 8.4 oz)     Palliative Assessment/Data:   Flowsheet Rows     Most Recent Value  Intake Tab  Referral Department  Hospitalist  Unit at Time of Referral  Intermediate Care Unit  Palliative Care Primary Diagnosis  Sepsis/Infectious Disease  Date Notified  10/17/16  Palliative Care Type  New Palliative care  Reason for referral  Clarify Goals of Care, Psychosocial or Spiritual support  Date of Admission  10/17/16  Date first seen by Palliative Care  10/18/16  # of days Palliative referral response time  1 Day(s)  # of days IP prior to Palliative referral  0  Clinical Assessment  Palliative Performance Scale Score  30%  Pain Max last 24 hours  Not able to report  Pain Min Last 24 hours  Not able to report  Dyspnea Max Last 24 Hours  Not able to report  Dyspnea Min Last 24 hours  Not able to report  Nausea Max Last 24 Hours  Not able to report  Nausea Min Last 24 Hours  Not able to report  Anxiety Max Last 24 Hours  Not able to report  Anxiety Min Last 24 Hours  Not able to report  Other Max Last 24 Hours  Not able to report  Psychosocial & Spiritual Assessment  Palliative Care Outcomes  Patient/Family meeting held?  Yes  Who was at the meeting?  pt and pt's son  Palliative Care Outcomes  Clarified goals of care  Patient/Family wishes: Interventions discontinued/not started   Mechanical Ventilation, Hemodialysis, Vasopressors, Trach, Tube feedings/TPN, PEG  Palliative Care follow-up planned  Yes, Facility  Time In: 1000 Time Out: 1055 Time Total: 55 min Greater than 50%  of this time was spent counseling and coordinating care related to the above assessment and plan.  Signed by: Dory Horn, NP   Please contact Palliative Medicine Team phone at [phone] for questions and  concerns.  For individual provider: See Shea Evans

## 2016-10-18 NOTE — Progress Notes (Signed)
Unable to obtain from patient

## 2016-10-18 NOTE — Evaluation (Signed)
Physical Therapy Evaluation Patient Details Name: Edward Harvey MRN: [id number] DOB: December 25, 1915 Today's Date: 10/18/2016   History of Present Illness  83 y.o. male with a past medical history significant for advanced dementia, CHP with pacer, HFpEF, HTN, CAD, CKD stage III, and Crohn's disease and diverticulosis s/p colectomy, who presents with lethargy for 1 day.   Clinical Impression  Pt admitted with above diagnosis. Pt currently with functional limitations due to the deficits listed below (see PT Problem List). Eval limited by pt agitation. He was combative and resistant to mobility. +2 max assist utilized to attempt supine to sit transfer. Unable to progress beyond EOB and required return to supine in bed. Pt will benefit from skilled PT to increase their independence and safety with mobility to allow discharge to the venue listed below.  PTA pt w/c bound but able to transfer with staff assist. 2x week trial to see if pt mentation improves and he is willing to participate in therapy.     Follow Up Recommendations SNF    Equipment Recommendations  None recommended by PT    Recommendations for Other Services       Precautions / Restrictions Precautions Precautions: Fall;Other (comment) Precaution Comments: agitated/combative Restrictions Weight Bearing Restrictions: Yes      Mobility  Bed Mobility Overal bed mobility: Needs Assistance Bed Mobility: Supine to Sit;Sit to Supine     Supine to sit: +2 for physical assistance;Max assist;HOB elevated Sit to supine: +2 for physical assistance;Max assist   General bed mobility comments: Bed pad used to helicopter pt supine to sit. Pt combative, pushing trunk into extension requiring immediate return to supine.  Transfers                 General transfer comment: unable  Ambulation/Gait             General Gait Details: unable   Stairs            Wheelchair Mobility    Modified Rankin (Stroke Patients  Only)       Balance                                             Pertinent Vitals/Pain Pain Assessment: Faces Faces Pain Scale: No hurt    Home Living Family/patient expects to be discharged to:: Skilled nursing facility                 Additional Comments: Pt from [facility].    Prior Function Level of Independence: Needs assistance   Gait / Transfers Assistance Needed: wheelchair bound, unsure of assist level for transfers.   ADL's / Homemaking Assistance Needed: needs assist        Hand Dominance        Extremity/Trunk Assessment                Communication   Communication: HOH  Cognition Arousal/Alertness: Lethargic Behavior During Therapy: Agitated Overall Cognitive Status: Difficult to assess                                        General Comments      Exercises     Assessment/Plan    PT Assessment Patient needs continued PT services  PT Problem List Decreased activity tolerance;Decreased mobility;Decreased  cognition;Decreased balance       PT Treatment Interventions Therapeutic activities;Functional mobility training;Balance training;Therapeutic exercise;Patient/family education    PT Goals (Current goals can be found in the Care Plan section)  Acute Rehab PT Goals Patient Stated Goal: unable to state PT Goal Formulation: Patient unable to participate in goal setting Time For Goal Achievement: 11/01/16 Potential to Achieve Goals: Poor    Frequency Min 2X/week   Barriers to discharge        Co-evaluation               AM-PAC PT "6 Clicks" Daily Activity  Outcome Measure Difficulty turning over in bed (including adjusting bedclothes, sheets and blankets)?: Total Difficulty moving from lying on back to sitting on the side of the bed? : Total Difficulty sitting down on and standing up from a chair with arms (e.g., wheelchair, bedside commode, etc,.)?: Total Help needed moving to  and from a bed to chair (including a wheelchair)?: Total Help needed walking in hospital room?: Total Help needed climbing 3-5 steps with a railing? : Total 6 Click Score: 6    End of Session   Activity Tolerance: Treatment limited secondary to agitation Patient left: in bed;with bed alarm set;with call bell/phone within reach Nurse Communication: Mobility status PT Visit Diagnosis: Other abnormalities of gait and mobility (R26.89)    Time: 8453-6468 PT Time Calculation (min) (ACUTE ONLY): 10 min   Charges:   PT Evaluation $PT Eval Moderate Complexity: 1 Procedure     PT G Codes:        Lorrin Goodell, PT  Office # [phone] Pager [phone]   Lorriane Shire 10/18/2016, 10:21 AM

## 2016-10-18 NOTE — Progress Notes (Signed)
Unsure, records from NH did not indicate diet or toelration of such

## 2016-10-18 NOTE — Evaluation (Signed)
Clinical/Bedside Swallow Evaluation Patient Details  Name: HANIEL FIX MRN: [id number] Date of Birth: 1916-05-17  Today's Date: 10/18/2016 Time: SLP Start Time (ACUTE ONLY): 1100 SLP Stop Time (ACUTE ONLY): 1125 SLP Time Calculation (min) (ACUTE ONLY): 25 min  Past Medical History:  Past Medical History:  Diagnosis Date  . A-fib ([place])   . Acute bronchitis   . Acute renal failure (ARF) ([place])   . Anemia   . Antral ulcer 2011  . Atrioventricular block, complete ([place])   . C. difficile colitis   . CAD (coronary artery disease)    nuclear stress test 2006 inferoseptal and apical infarct ejection fraction 48%  . Crohn disease ([place])   . Diverticulosis of colon with hemorrhage   . DJD (degenerative joint disease)   . Gallstone   . Glaucoma   . Gout   . Hematoma    subdural  . Hemorrhage of gastrointestinal tract, unspecified   . Hyperkalemia   . Hypertension   . Inguinal hernia   . Internal hemorrhoids without mention of complication   . Mild dementia   . Osteoarthritis   . Pacemaker   . Prostate cancer [facility])    radiation therapy in 1996/notes 10/29/2001 (05/28/2013)  . Segmental colitis ([place])   . Traumatic subdural hematoma ([facility])   . Unspecified intestinal obstruction    Past Surgical History:  Past Surgical History:  Procedure Laterality Date  . BURR HOLE FOR SUBDURAL HEMATOMA     "he's had 4 ORs"/notes 10/29/2001 (05/28/2013)  . CATARACT EXTRACTION, BILATERAL    . COLONOSCOPY  2009   Diverticulosis and Hemorrhoids   . INSERT / REPLACE / [place]  1994; 11/2004   Archie Endo 10/29/2001; generator change/notes 11/23/2004  (05/28/2013)  . LIPOMA EXCISION  2001   left leg  . PACEMAKER GENERATOR CHANGE N/A 08/17/2014   Procedure: PACEMAKER GENERATOR CHANGE;  Surgeon: Deboraha Sprang, MD;  Location: [facility] CATH LAB;  Service: Cardiovascular;  Laterality: N/A;  . PACEMAKER PLACEMENT    . PERICARDIOCENTESIS  1994   post pacemaker placement/notes 03/12/2005 (05/28/2013)  .  SUBTOTAL COLECTOMY  12/03   Dr. Lucia Gaskins  . TONSILLECTOMY AND ADENOIDECTOMY  age 83   03/12/2005 (05/28/2013)   HPI:  MOHAMEDAMIN NIFONG a 83 y.o.malewith a past medical history significant for advanced dementia, CHP with pacer, HFpEF, HTN, CAD, CKD stage III, and Crohn's disease and diverticulosis s/pcolectomy,who presents with lethargy for 1 day. Septic shock versus dehydration/failure to thrive. Son reports Mr Mcenery has been on a puree/nectar thick diet for about a month because of difficutly swallowing. Typically he loves water and drinks a lot of water. He says he only drinks nectar thick juice with two staff members in particular who have success feeding him.    Assessment / Plan / Recommendation Clinical Impression  Pt demonstrates dysphagia likely secondary to progressive dementia, including assist needed for self feeding, intermittently decreased awareness of PO, open mouth posture with cup sips and coughing with large boluses, particularly with rapid straw sips. Pts son was at bedside and reported that his father typically loves water and drinks water all day, but recently developed difficulty swallowing and has less access and ability to take liquids as a result. Suspect use of thickened liquids may play a role in pts acute presentation with concern from MD about dehydration. We discussed importance of access to fluids and how this can change when diet is modified. SLP trialed different methods of intake with pt, He was able to take small  controlled cup sips with SLP with minimal coughing. Hand over hand assist also helpful in increasing pts control over the cup. We discussed that pt may benefit from a closed lid cup, such a Provale cup or a simple sippy cup to reduce rate of intake. Advised son to consider the benefits of water for pts comfort and hydration perhaps though with greater risk fo aspiration. We also discussed with need for oral care to reduce bacterial load with aspirated liquids.  Will proceed with Dys 1 (puree) texture with thin liquids, water only, no straw. Would not recommend objective testing as pts participation expected to be poor (has been combative) and plan of care would likely not change given results. SLP will follow for tolerance and further education as needed.  SLP Visit Diagnosis: Dysphagia, oropharyngeal phase (R13.12)    Aspiration Risk  Moderate aspiration risk;Risk for inadequate nutrition/hydration    Diet Recommendation Dysphagia 1 (Puree);Thin liquid   Liquid Administration via: Cup;No straw Medication Administration: Crushed with puree Supervision: Staff to assist with self feeding;Full supervision/cueing for compensatory strategies Compensations: Small sips/bites;Slow rate;Minimize environmental distractions Postural Changes: Seated upright at 90 degrees;Remain upright for at least 30 minutes after po intake    Other  Recommendations Oral Care Recommendations: Oral care BID   Follow up Recommendations Skilled Nursing facility      Frequency and Duration min 2x/week  2 weeks       Prognosis Prognosis for Safe Diet Advancement: Fair Barriers to Reach Goals: Cognitive deficits      Swallow Study   General HPI: Kavonte Bearse Wallis a 83 y.o.malewith a past medical history significant for advanced dementia, CHP with pacer, HFpEF, HTN, CAD, CKD stage III, and Crohn's disease and diverticulosis s/pcolectomy,who presents with lethargy for 1 day. Septic shock versus dehydration/failure to thrive. Son reports Mr Sutch has been on a puree/nectar thick diet for about a month because of difficutly swallowing. Typically he loves water and drinks a lot of water. He says he only drinks nectar thick juice with two staff members in particular who have success feeding him.  Type of Study: Bedside Swallow Evaluation Previous Swallow Assessment: none Diet Prior to this Study: NPO Temperature Spikes Noted: No Respiratory Status: Room air History of  Recent Intubation: No Behavior/Cognition: Alert;Cooperative;Requires cueing Oral Care Completed by SLP: No Oral Cavity - Dentition: Edentulous Vision: Functional for self-feeding Self-Feeding Abilities: Able to feed self;Needs assist Patient Positioning: Upright in bed Baseline Vocal Quality: Normal    Oral/Motor/Sensory Function Overall Oral Motor/Sensory Function: Within functional limits   Ice Chips     Thin Liquid Thin Liquid: Impaired Presentation: Cup;Straw Pharyngeal  Phase Impairments: Suspected delayed Swallow;Cough - Immediate    Nectar Thick Nectar Thick Liquid: Not tested   Honey Thick Honey Thick Liquid: Not tested   Puree Puree: Not tested   Solid   GO   Solid: Impaired Presentation: Spoon Oral Phase Impairments: Impaired mastication Oral Phase Functional Implications: Impaired mastication;Prolonged oral transit       Herbie Baltimore, MA CCC-SLP [phone]  Kymoni Lesperance, Katherene Ponto 10/18/2016,11:50 AM

## 2016-10-18 NOTE — Care Management Note (Signed)
Case Management Note  Patient Details  Name: DONN ZANETTI MRN: [id number] Date of Birth: 07-Jan-1916  Subjective/Objective:                    Action/Plan:  Pt is from SNF - 83 years old, decision made for DNR.  Pt has family (son) as support system.  CSW consulted  Expected Discharge Date:                  Expected Discharge Plan:  [place] (from SNF)  In-House Referral:  Clinical Social Work  Discharge planning Services  CM Consult  Post Acute Care Choice:    Choice offered to:     DME Arranged:    DME Agency:     HH Arranged:    HH Agency:     Status of Service:     If discussed at [employer], dates discussed:    Additional Comments:  Maryclare Labrador, RN 10/18/2016, 10:14 AM

## 2016-10-18 NOTE — Progress Notes (Signed)
CRITICAL VALUE ALERT  Critical value received:lactic Scid  Date of notification:  10/18/2016  Time of notification:  0350  Critical value read back: yes  Nurse who received alert:  [email or web address]  MD notified (1st page):  10/18/2016  Time of first page: 0356  MD notified (2nd page):  Time of second page:  Responding MD:   Time MD responded:

## 2016-10-19 LAB — COMPREHENSIVE METABOLIC PANEL
ALT: 14 U/L — AB (ref 17–63)
ANION GAP: 5 (ref 5–15)
AST: 18 U/L (ref 15–41)
Albumin: 1.9 g/dL — ABNORMAL LOW (ref 3.5–5.0)
Alkaline Phosphatase: 70 U/L (ref 38–126)
BUN: 8 mg/dL (ref 6–20)
CALCIUM: 7.8 mg/dL — AB (ref 8.9–10.3)
CHLORIDE: 118 mmol/L — AB (ref 101–111)
CO2: 18 mmol/L — AB (ref 22–32)
CREATININE: 0.96 mg/dL (ref 0.61–1.24)
GFR calc non Af Amer: >60 mL/min (ref >60)
Glucose, Bld: 89 mg/dL (ref 65–99)
Potassium: 3.6 mmol/L (ref 3.5–5.1)
SODIUM: 141 mmol/L (ref 135–145)
TOTAL PROTEIN: 5.5 g/dL — AB (ref 6.5–8.1)
Total Bilirubin: 0.7 mg/dL (ref 0.3–1.2)

## 2016-10-19 LAB — CBC
HCT: 29.3 % — ABNORMAL LOW (ref 39.0–52.0)
Hemoglobin: 10.1 g/dL — ABNORMAL LOW (ref 13.0–17.0)
MCH: 32.3 pg (ref 26.0–34.0)
MCHC: 34.5 g/dL (ref 30.0–36.0)
MCV: 93.6 fL (ref 78.0–100.0)
PLATELETS: 125 10*3/uL — AB (ref 150–400)
RBC: 3.13 MIL/uL — AB (ref 4.22–5.81)
RDW: 17.4 % — ABNORMAL HIGH (ref 11.5–15.5)
WBC: 5.6 10*3/uL (ref 4.0–10.5)

## 2016-10-19 LAB — MAGNESIUM: Magnesium: 1.9 mg/dL (ref 1.7–2.4)

## 2016-10-19 MED ORDER — ENSURE ENLIVE PO LIQD
237.0000 mL | Freq: Two times a day (BID) | ORAL | Status: DC
  Administered 2016-10-19 – 2016-10-23 (×8): 237 mL via ORAL

## 2016-10-19 NOTE — Progress Notes (Signed)
  Speech Language Pathology Treatment: Dysphagia  Patient Details Name: Edward Harvey MRN: [id number] DOB: 1915-12-21 Today's Date: 10/19/2016 Time: 5852-7782 SLP Time Calculation (min) (ACUTE ONLY): 11 min  Assessment / Plan / Recommendation Clinical Impression  Pt again seen with trials of thin liquids to assess tolerance of current strategy (thin water, cup sips only) to reduce risk of aspiration. Pt alert and participatory, SLP repositioned and offered cup with hand over hand assist. Pt consumed 8 oz of water with min tactile cues to limit bolus size. Delayed, audible swallow observed, but no coughing or throat clearing. Recommend pt continue with current diet strategy to reduce risk but also provide comfort and hydration. Will follow for tolerance with meal and further education with family as needed.   HPI HPI: Edward Harvey a 83 y.o.malewith a past medical history significant for advanced dementia, CHP with pacer, HFpEF, HTN, CAD, CKD stage III, and Crohn's disease and diverticulosis s/pcolectomy,who presents with lethargy for 1 day. Septic shock versus dehydration/failure to thrive. Son reports Mr Gomillion has been on a puree/nectar thick diet for about a month because of difficutly swallowing. Typically he loves water and drinks a lot of water. He says he only drinks nectar thick juice with two staff members in particular who have success feeding him.       SLP Plan  Continue with current plan of care       Recommendations  Diet recommendations: Thin liquid;Dysphagia 1 (puree) Liquids provided via: Cup;No straw Medication Administration: Crushed with puree Supervision: Staff to assist with self feeding Compensations: Small sips/bites;Slow rate;Minimize environmental distractions Postural Changes and/or Swallow Maneuvers: Seated upright 90 degrees;Upright 30-60 min after meal                Oral Care Recommendations: Oral care BID Follow up Recommendations: Skilled  Nursing facility SLP Visit Diagnosis: Dysphagia, oropharyngeal phase (R13.12) Plan: Continue with current plan of care       GO               [facility], MA CCC-SLP [phone]  Lynann Beaver 10/19/2016, 10:00 AM

## 2016-10-19 NOTE — Progress Notes (Signed)
Nutrition Brief Note  Patient identified on the Malnutrition Screening Tool (MST) Report Pt with hx of dementia admitted from SNF with altered mental status. Plan is to return to SNF.Pt is DNR/DNI and does not want any artifical feeding. On dysphagia diet working with SLP to reduce risk of aspiration but provide comfort. Ensure Enlive ordered BID.   Wt Readings from Last 15 Encounters:  10/19/16 160 lb 15 oz (73 kg)  08/17/14 185 lb (83.9 kg)  08/12/14 185 lb 9.6 oz (84.2 kg)  05/03/14 183 lb (83 kg)  04/14/14 181 lb 14.1 oz (82.5 kg)  11/04/13 198 lb 6.4 oz (90 kg)  09/10/13 176 lb 4.8 oz (80 kg)  08/12/13 200 lb 13.4 oz (91.1 kg)  08/06/13 200 lb 14.4 oz (91.1 kg)  08/04/13 208 lb (94.3 kg)  07/23/13 208 lb 3.2 oz (94.4 kg)  07/06/13 209 lb (94.8 kg)  06/02/13 207 lb 6.4 oz (94.1 kg)  05/27/13 208 lb 8.9 oz (94.6 kg)  03/26/13 209 lb 8 oz (95 kg)    Body mass index is 24.47 kg/m. WNL  Current diet order is Dysphagia 1 with Thin liquids. Labs and medications reviewed.   No further nutrition interventions warranted at this time. Please consult RD as needed.   [place], [place], [place] Pager [phone] After Hours Pager

## 2016-10-19 NOTE — Plan of Care (Signed)
Problem: Education: Goal: Knowledge of [facility] Education information/materials will improve Outcome: Progressing Discuss with family  Problem: Physical Regulation: Goal: Ability to maintain clinical measurements within normal limits will improve Outcome: Progressing Hemodynamically stable  Problem: Fluid Volume: Goal: Ability to maintain a balanced intake and output will improve Outcome: Progressing Still ahead in fluid, creatinine decreased to normal

## 2016-10-19 NOTE — Progress Notes (Addendum)
Edward Harvey  IRJ:[id number] DOB: 08-21-1915 DOA: 10/17/2016 PCP: Edward Bowen, MD   Brief Narrative: 83 y.o.malewith a past medical history significant for advanced dementia, CHB with pacer, HFpEF, HTN, CAD, CKD stage III, and Crohn's disease and diverticulosis s/pcolectomy,who presents with lethargy for 1 day. Initially found to be hypotensive requiring Levophed. Subsequently taken off vasopressors. Admitted to step down for fluid resuscitation and antibiotics.  Assessment & Plan:   # Shock, presumed septic:Likely related with healthcare associated pneumonia vs UTI Presents hypothermic, hypotensive, bradycardic, and apneic and with elevated lactic acid. Septic shock versus dehydration/failure to thrive. clinically improving. -IV vancomycin and Zosyn , day #3. MRSA screen negative therefore discontinue IV vancomycin -Follow blood and urine cultures. Urine culture growing Escherichia coli. Follow-up sensitivity. Blood culture has been negative so far.  -Chest x-ray with worsening infiltrates in both lower lobes consistent with pneumonia.  -Palliative care consultation for goals of care -Currently on aspiration precautions, dysphagia 1 diet recommended by speech therapy -Continue to monitor for clinical improvement. Blood pressure improved but patient is still hypothermic.  # h/o Hypertension and hx of CAD: Continue aspirin, Holding furosemide    # Normocytic anemia: stable  #  Advanced Dementia without behavioral disturbance: Continue supportive care. Palliative care eval is ongoing. Likely discharge to skilled facility with outpatient hospice care.  #  Atrial fibrillation: CHADS2Vasc 5. Not on anticoagulation.  # History of Crohn's colitis: stable  # Other medications: Resume tamsulosin, allopurinol,   SW consult.   Principal Problem:   Shock [facility]) Active Problems:   Essential hypertension   Coronary atherosclerosis   Senile  dementia   Atrial fibrillation (HCC)   Crohn's colitis ([place])   Normocytic anemia   Palliative care by specialist  DVT prophylaxis: Heparin subcutaneous Code Status: DNR/DNI Family Communication: No family present at bedside Disposition Plan: Likely discharge to skilled facility in 1-2 days    Consultants:   Palliative care  Procedures: None Antimicrobials: Vancomycin and Zosyn since May 17  Subjective: Seen and examined at bedside. Patient wasn't eating with help from nurse. He was alert awake. Denied headache, dizziness, nausea or vomiting. No chest pain or shortness of breath.  Objective: Vitals:   10/19/16 0800 10/19/16 0820 10/19/16 1145 10/19/16 1200  BP:  104/72 131/82   Pulse: 61 (!) 59 (!) 59 62  Resp: (!) 24 12 10 17   Temp:  (!) 96.3 F (35.7 C)    TempSrc:  Axillary Oral   SpO2: 99% 100% 99% 94%  Weight:      Height:        Intake/Output Summary (Last 24 hours) at 10/19/16 1620 Last data filed at 10/19/16 1400  Gross per 24 hour  Intake             2795 ml  Output              550 ml  Net             2245 ml   Filed Weights   10/17/16 2220 10/18/16 0211 10/19/16 0403  Weight: 68 kg (149 lb 14.6 oz) 71 kg (156 lb 8.4 oz) 73 kg (160 lb 15 oz)    Examination:  General exam: Elderly male sitting on bed comfortable  Respiratory system: Clear to auscultation. Respiratory effort normal. No wheezing or crackle Cardiovascular system: S1 & S2 heard, RRR.  No pedal edema. Gastrointestinal system: Abdomen is nondistended, soft and nontender. Normal bowel sounds heard. Central nervous system:  Alert awake and following simple commands Skin: No rashes, lesions or ulcers Psychiatry: Judgement and insight appear impaired    Data Reviewed: I have personally reviewed following labs and imaging studies  CBC:  Recent Labs Lab 10/17/16 1319 10/18/16 0245 10/19/16 0245  WBC 6.2 5.8 5.6  NEUTROABS 4.6  --   --   HGB 10.9* 9.9* 10.1*  HCT 31.5* 29.3* 29.3*    MCV 94.9 96.4 93.6  PLT 141* 126* 003*   Basic Metabolic Panel:  Recent Labs Lab 10/17/16 1319 10/18/16 0245 10/19/16 0245  NA 144 143 141  K 3.9 3.6 3.6  CL 114* 117* 118*  CO2 23 18* 18*  GLUCOSE 104* 61* 89  BUN 10 9 8   CREATININE 1.03 0.97 0.96  CALCIUM 8.5* 7.8* 7.8*  MG  --  1.4* 1.9  PHOS  --  2.9  --    GFR: Estimated Creatinine Clearance: 40.6 mL/min (by C-G formula based on SCr of 0.96 mg/dL). Liver Function Tests:  Recent Labs Lab 10/17/16 1319 10/19/16 0245  AST 24 18  ALT 16* 14*  ALKPHOS 82 70  BILITOT 0.7 0.7  PROT 6.3* 5.5*  ALBUMIN 2.1* 1.9*   No results for input(s): LIPASE, AMYLASE in the last 168 hours. No results for input(s): AMMONIA in the last 168 hours. Coagulation Profile: No results for input(s): INR, PROTIME in the last 168 hours. Cardiac Enzymes:  Recent Labs Lab 10/17/16 1319  TROPONINI 0.04*   BNP (last 3 results) No results for input(s): PROBNP in the last 8760 hours. HbA1C: No results for input(s): HGBA1C in the last 72 hours. CBG:  Recent Labs Lab 10/17/16 1256  GLUCAP 87   Lipid Profile: No results for input(s): CHOL, HDL, LDLCALC, TRIG, CHOLHDL, LDLDIRECT in the last 72 hours. Thyroid Function Tests: No results for input(s): TSH, T4TOTAL, FREET4, T3FREE, THYROIDAB in the last 72 hours. Anemia Panel: No results for input(s): VITAMINB12, FOLATE, FERRITIN, TIBC, IRON, RETICCTPCT in the last 72 hours. Sepsis Labs:  Recent Labs Lab 10/17/16 1356 10/17/16 1734 10/17/16 2304 10/18/16 0245  LATICACIDVEN 2.68* 1.2 1.6 2.5*    Recent Results (from the past 240 hour(s))  Culture, blood (routine x 2)     Status: None (Preliminary result)   Collection Time: 10/17/16  1:19 PM  Result Value Ref Range Status   Specimen Description BLOOD IVSITE  Final   Special Requests   Final    BOTTLES DRAWN AEROBIC AND ANAEROBIC Blood Culture adequate volume   Culture NO GROWTH 2 DAYS  Final   Report Status PENDING  Incomplete   Culture, Urine     Status: Abnormal (Preliminary result)   Collection Time: 10/17/16  3:12 PM  Result Value Ref Range Status   Specimen Description URINE, RANDOM  Final   Special Requests NONE  Final   Culture 81,000 COLONIES/mL ESCHERICHIA COLI (A)  Final   Report Status PENDING  Incomplete  MRSA PCR Screening     Status: None   Collection Time: 10/17/16  9:34 PM  Result Value Ref Range Status   MRSA by PCR NEGATIVE NEGATIVE Final    Comment:        The GeneXpert MRSA Assay (FDA approved for NASAL specimens only), is one component of a comprehensive MRSA colonization surveillance program. It is not intended to diagnose MRSA infection nor to guide or monitor treatment for MRSA infections.          Radiology Studies: Dg Chest Port 1 View  Result Date: 10/18/2016 CLINICAL  DATA:  Fever EXAM: PORTABLE CHEST 1 VIEW COMPARISON:  10/17/2016 FINDINGS: Chronic cardiomegaly. Chronic aortic atherosclerosis. Dual lead pacemaker in place. Worsening of infiltrate/volume loss in both lower lobes consistent with pneumonia. Upper lungs are clear. No acute bone finding. IMPRESSION: Worsened infiltrate and volume loss in both lower lobes consistent with pneumonia. Electronically Signed   By: Nelson Chimes M.D.   On: 10/18/2016 12:37        Scheduled Meds: . allopurinol  300 mg Oral q morning - 10a  . aspirin  81 mg Oral QPM  . famotidine  20 mg Oral Daily  . feeding supplement (ENSURE ENLIVE)  237 mL Oral BID BM  . heparin  5,000 Units Subcutaneous Q8H  . saccharomyces boulardii  250 mg Oral BID  . tamsulosin  0.4 mg Oral QPC supper   Continuous Infusions: . 0.9 % NaCl with KCl 20 mEq / L 75 mL/hr at 10/19/16 1200  . piperacillin-tazobactam (ZOSYN)  IV 3.375 g (10/19/16 1424)  . vancomycin 1,250 mg (10/19/16 1424)     LOS: 2 days    Bodi Palmeri Tanna Furry, MD [facility] Pager [phone]  If 7PM-7AM, please contact night-coverage [email or web address] Password  [email or web address] 10/19/2016, 4:20 PM

## 2016-10-19 NOTE — Progress Notes (Signed)
Daily Progress Note   Patient Name: Edward Harvey       Date: 10/19/2016 DOB: 1916-02-14  Age: 83 y.o. MRN#: [id number] Attending Physician: Rosita Fire, MD Primary Care Physician: Reynold Bowen, MD Admit Date: 10/17/2016  Reason for Consultation/Follow-up: Establishing goals of care, Non pain symptom management, Pain control and Psychosocial/spiritual support  Subjective: Patient is alert this morning. He is eating breakfast. He is being fed by staff. No family currently at the bedside. Chart reviewed, chest x-ray from 10/18/2016 reflects worsening infiltrates.  Length of Stay: 2  Current Medications: Scheduled Meds:  . allopurinol  300 mg Oral q morning - 10a  . aspirin  81 mg Oral QPM  . famotidine  20 mg Oral Daily  . feeding supplement (ENSURE ENLIVE)  237 mL Oral BID BM  . heparin  5,000 Units Subcutaneous Q8H  . saccharomyces boulardii  250 mg Oral BID  . tamsulosin  0.4 mg Oral QPC supper    Continuous Infusions: . 0.9 % NaCl with KCl 20 mEq / L 75 mL/hr at 10/19/16 0600  . piperacillin-tazobactam (ZOSYN)  IV Stopped (10/19/16 0913)  . vancomycin Stopped (10/18/16 1620)    PRN Meds: acetaminophen **OR** acetaminophen, ondansetron **OR** ondansetron (ZOFRAN) IV  Physical Exam  Constitutional:  Frail, cachectic elderly man in no acute distress  HENT:  Temporal wasting  Neck: Normal range of motion.  Cardiovascular:  Irregular  Pulmonary/Chest: Effort normal.  Musculoskeletal: Normal range of motion.  Neurological: He is alert.  Unable to test orientation. Observed patient being fed by staff. He is following simple instructions in order to eat. He is nodding his head yes or no in terms of wanting more food  Skin: Skin is warm and dry.  Psychiatric:  No  agitation  Nursing note and vitals reviewed.           Vital Signs: BP 104/72   Pulse (!) 59   Temp (!) 96.3 F (35.7 C) (Axillary)   Resp 12   Ht 5\' 8"  (1.727 m)   Wt 73 kg (160 lb 15 oz)   SpO2 100%   BMI 24.47 kg/m  SpO2: SpO2: 100 % O2 Device: O2 Device: Not Delivered O2 Flow Rate: O2 Flow Rate (L/min): 10 L/min  Intake/output summary:  Intake/Output Summary (Last 24 hours) at 10/19/16 1045  Last data filed at 10/19/16 0600  Gross per 24 hour  Intake           918.75 ml  Output              550 ml  Net           368.75 ml   LBM:   Baseline Weight: Weight: 83.9 kg (185 lb) Most recent weight: Weight: 73 kg (160 lb 15 oz)       Palliative Assessment/Data:    Flowsheet Rows     Most Recent Value  Intake Tab  Referral Department  Hospitalist  Unit at Time of Referral  Intermediate Care Unit  Palliative Care Primary Diagnosis  Sepsis/Infectious Disease  Date Notified  10/17/16  Palliative Care Type  New Palliative care  Reason for referral  Clarify Goals of Care, Psychosocial or Spiritual support  Date of Admission  10/17/16  Date first seen by Palliative Care  10/18/16  # of days Palliative referral response time  1 Day(s)  # of days IP prior to Palliative referral  0  Clinical Assessment  Palliative Performance Scale Score  30%  Pain Max last 24 hours  Not able to report  Pain Min Last 24 hours  Not able to report  Dyspnea Max Last 24 Hours  Not able to report  Dyspnea Min Last 24 hours  Not able to report  Nausea Max Last 24 Hours  Not able to report  Nausea Min Last 24 Hours  Not able to report  Anxiety Max Last 24 Hours  Not able to report  Anxiety Min Last 24 Hours  Not able to report  Other Max Last 24 Hours  Not able to report  Psychosocial & Spiritual Assessment  Palliative Care Outcomes  Patient/Family meeting held?  Yes  Who was at the meeting?  pt and pt's son  Palliative Care Outcomes  Clarified goals of care  Patient/Family wishes:  Interventions discontinued/not started   Mechanical Ventilation, Hemodialysis, Vasopressors, Trach, Tube feedings/TPN, PEG  Palliative Care follow-up planned  Yes, Facility      Patient Active Problem List   Diagnosis Date Noted  . Palliative care by specialist   . Shock ([place]) 10/17/2016  . Normocytic anemia 10/17/2016  . Hypothermia   . Somnolence   . Diarrhea 12/30/2014  . History of Clostridium difficile colitis 12/30/2014  . History of prostate cancer 05/03/2014  . Hypernatremia 04/11/2014  . Hypokalemia 04/11/2014  . ARF (acute renal failure) ([place]) 04/10/2014  . SBO (small bowel obstruction) ([place]) 04/09/2014  . Acute renal failure syndrome ([place])   . Chronic venous insufficiency 11/04/2013  . Edema 11/04/2013  . Acute gastroenteritis 09/12/2013  . Acute on chronic renal failure ([place]) 09/10/2013  . Nausea with vomiting 09/10/2013  . Severe protein-calorie malnutrition ([place]) 09/10/2013  . BPH (benign prostatic hyperplasia) 09/10/2013  . Hypovolemia dehydration 09/10/2013  . Acute renal failure ([place]) 08/11/2013  . Left arm weakness 08/11/2013  . Hypotension 05/26/2013  . H/O prostate cancer 12/08/2012  . Syncope 09/06/2012  . Crohn's colitis ([place]) 06/09/2012  . Renal insufficiency 06/09/2012  . History of subdural hematoma (post traumatic) 06/09/2012  . Gait abnormality 06/09/2012  . Atrial fibrillation ([place]) 03/25/2012  . Senile dementia 03/19/2012  . PPM-[place] 01/14/2011  . Coronary atherosclerosis 06/16/2008  . AV BLOCK, COMPLETE 06/16/2008  . INGUINAL HERNIA 06/16/2008  . Osteoarthritis 06/16/2008  . Essential hypertension 11/26/2007  . CARCINOMA, PROSTATE, HX OF 11/26/2007    Palliative Care Assessment &  Plan   Patient Profile: 83 y.o. male  with past medical history of Atrial fib, chronic kidney disease stage III, anemia, atrioventricular block, C. difficile, Crohn's disease with colectomy, glaucoma, GI bleed, dementia, prostate cancer, pacemaker,  congestive heart failure with preserved ejection fraction, admitted on 10/17/2016 with altered mental status, lethargy. Lactic acid on admission was 2.68, troponin 0.04, urinalysis showed bacteria and red blood cells but no pyuria; CT of the head was unremarkable. Chest x-ray showed a chronic left base opacity. Patient was given IV fluids and started on broad-spectrum anti biotics as well as levophed.   Patient is loaded on her on pressors. IV fluids and antibiotics are still on board. Patient is more alert than he has been but his chest x-ray reflects worsening infiltrates.   Assessment: Plan with family has been to continue to treat the treatable such as continuing IV fluids and antibiotics.  Continue DO NOT RESUSCITATE, DO NOT INTUBATE no pressors Palliative medicine to stay involved. If patient clinically declines this might be another opportunity to speak to family about ongoing healthcare goals. Per his sons from previous conversations, goals have been to continue to treat the treatable and help patient to live as long as possible   Recommendations/Plan:  Continue with antibiotics, IV fluids and other appropriate medical management  Plan for palliative medicine to continue to shadow chart for decline or other needs that we can support patient and family with  Patient would qualify for his hospice benefit in the facility. If he were to decline clinically during this hospitalization, palliative medicine to address with family. At this point hospice discussion was not something that family wishes to pursue; but if his pneumonia is worsening that may be something they would be receptive to  Goals of Care and Additional Recommendations:  Limitations on Scope of Treatment: No Artificial Feeding, No Radiation, No Surgical Procedures and No Tracheostomy  Code Status:    Code Status Orders        Start     Ordered   10/17/16 2132  Do not attempt resuscitation (DNR)  Continuous    Question  Answer Comment  In the event of cardiac or respiratory ARREST Do not call a "code blue"   In the event of cardiac or respiratory ARREST Do not perform Intubation, CPR, defibrillation or ACLS   In the event of cardiac or respiratory ARREST Use medication by any route, position, wound care, and other measures to relive pain and suffering. May use oxygen, suction and manual treatment of airway obstruction as needed for comfort.      10/17/16 2132    Code Status History    Date Active Date Inactive Code Status Order ID Comments User Context   10/17/2016  5:58 PM 10/17/2016  9:32 PM DNR [id number]  Corey Harold, NP ED   08/17/2014  2:05 PM 08/17/2014  6:13 PM Full Code [id number]  Deboraha Sprang, MD Inpatient   04/09/2014  9:27 AM 04/14/2014  7:42 PM Full Code [id number]  Thurnell Lose, MD Inpatient   09/10/2013  5:50 PM 09/13/2013  8:09 PM Full Code [id number]  Barton Dubois, MD Inpatient   08/12/2013  1:18 AM 08/14/2013  7:07 PM Full Code [id number]  Merton Border, MD Inpatient   05/27/2013  2:48 AM 05/29/2013  4:27 PM Full Code [id number]  Rise Patience, MD Inpatient   09/06/2012  4:46 PM 09/07/2012  2:11 PM Full Code [id number]  Geradine Girt, DO Inpatient  Prognosis:   < 6 months in the setting of advanced dementia, debility, patient is wheelchair bound, high risk for aspiration pneumonia secondary to frailty, dementia; protein calorie malnutrition with an albumin of 1.9. Concern for patient, that despite antibiotics chest x-ray reflects worsening infiltrates  Discharge Planning:  [place] for rehab with Palliative care service follow-up   Thank you for allowing the Palliative Medicine Team to assist in the care of this patient.   Time In: 0900 Time Out: 0925 Total Time 25 Prolonged Time Billed  no       Greater than 50%  of this time was spent counseling and coordinating care related to the above assessment and plan.  Dory Horn, NP  Please contact  Palliative Medicine Team phone at [phone] for questions and concerns.

## 2016-10-20 ENCOUNTER — Inpatient Hospital Stay (HOSPITAL_COMMUNITY): Payer: Medicare Other

## 2016-10-20 DIAGNOSIS — N390 Urinary tract infection, site not specified: Secondary | ICD-10-CM

## 2016-10-20 DIAGNOSIS — Z1612 Extended spectrum beta lactamase (ESBL) resistance: Secondary | ICD-10-CM

## 2016-10-20 DIAGNOSIS — B9629 Other Escherichia coli [E. coli] as the cause of diseases classified elsewhere: Secondary | ICD-10-CM

## 2016-10-20 LAB — URINE CULTURE

## 2016-10-20 MED ORDER — SODIUM CHLORIDE 0.9% FLUSH
10.0000 mL | Freq: Two times a day (BID) | INTRAVENOUS | Status: DC
  Administered 2016-10-21 – 2016-10-23 (×2): 10 mL

## 2016-10-20 MED ORDER — SODIUM CHLORIDE 0.9% FLUSH
10.0000 mL | INTRAVENOUS | Status: DC | PRN
  Administered 2016-10-21 – 2016-10-23 (×3): 10 mL
  Filled 2016-10-20 (×3): qty 40

## 2016-10-20 MED ORDER — SODIUM CHLORIDE 0.9 % IV SOLN
1.0000 g | Freq: Two times a day (BID) | INTRAVENOUS | Status: DC
  Administered 2016-10-20 – 2016-10-23 (×7): 1 g via INTRAVENOUS
  Filled 2016-10-20 (×11): qty 1

## 2016-10-20 NOTE — Progress Notes (Signed)
Patient admitted at 15.Asked the incoming nurse to do a thorough skin assessement.

## 2016-10-20 NOTE — Progress Notes (Signed)
Pulled out iv line and condom cath. Consulted iv team for iv restart, claimed that yesterday 3 iv team tried  Able to get one and is hard stick. Discussed with md gave order for piccline awaiting call from son for consent.

## 2016-10-20 NOTE — Plan of Care (Signed)
Problem: Education: Goal: Knowledge of [facility] Education information/materials will improve Outcome: Progressing Ongoing patient forgetful

## 2016-10-20 NOTE — Progress Notes (Signed)
Patient pulled out 2 IV's placed back in  By IV team, patient reminded not to pull at lines, kirlex wrapped around line for protection

## 2016-10-20 NOTE — Progress Notes (Addendum)
Pharmacy Antibiotic Note  Edward Harvey is a 83 y.o. male  with PNA/UTI.  Pharmacy has been consulted for  Zosyn dosing.  He is noted with urine culture growing ESBL e coli. Spoke with Dr. Carolin Sicks and to change zosyn to meropenem -WBC= WNL, afebrile, CrCL ~ 40  Plan: -Meropenem 1gm IV q12h -Will follow renal function and clinical progress    Height: 5\' 8"  (172.7 cm) Weight: 161 lb 6 oz (73.2 kg) IBW/kg (Calculated) : 68.4  Temp (24hrs), Avg:98 F (36.7 C), Min:97.4 F (36.3 C), Max:98.7 F (37.1 C)   Recent Labs Lab 10/17/16 1319 10/17/16 1356 10/17/16 1734 10/17/16 2304 10/18/16 0245 10/19/16 0245  WBC 6.2  --   --   --  5.8 5.6  CREATININE 1.03  --   --   --  0.97 0.96  LATICACIDVEN  --  2.68* 1.2 1.6 2.5*  --     Estimated Creatinine Clearance: 40.6 mL/min (by C-G formula based on SCr of 0.96 mg/dL).    No Known Allergies  Antimicrobials this admission: 5/20 meropenem >> 5/17 vanc>> 5/20 5/17 Zosyn>>5/20  Dose adjustments this admission: n/a  Microbiology results: 5/17BCx: ngtd 5/17 UCx: e coli (80K)- sens to zosyn, imipenem, nitrofurantoin MRSA PCR- neg  Thank you for allowing pharmacy to be a part of this patient's care.  Hildred Laser, Pharm D 10/20/2016 1:35 PM

## 2016-10-20 NOTE — Progress Notes (Signed)
Transferred to [facility] [facility] by bed, stable, report given to RN, belongings given to pt.

## 2016-10-20 NOTE — Progress Notes (Signed)
Spoke with RN.  Once phone consent is obtained, to place SL PICC.

## 2016-10-20 NOTE — Progress Notes (Signed)
Attempted to call son Reindel, Anthonie JR, x3 left message on cell phone to call back to get consent for picc line, still awaiting for return call, discussed with IV team.

## 2016-10-20 NOTE — Progress Notes (Signed)
Peripherally Inserted Central Catheter/Midline Placement  The IV Nurse has discussed with the patient and/or persons authorized to consent for the patient, the purpose of this procedure and the potential benefits and risks involved with this procedure.  The benefits include less needle sticks, lab draws from the catheter, and the patient may be discharged home with the catheter. Risks include, but not limited to, infection, bleeding, blood clot (thrombus formation), and puncture of an artery; nerve damage and irregular heartbeat and possibility to perform a PICC exchange if needed/ordered by physician.  Alternatives to this procedure were also discussed.  Bard Power PICC patient education guide, fact sheet on infection prevention and patient information card has been provided to patient /or left at bedside.  Phone Consent obtained from son .  PICC/Midline Placement Documentation  PICC Single Lumen 10/20/16 PICC Left Brachial 42 cm 0 cm (Active)  Indication for Insertion or Continuance of Line Prolonged intravenous therapies;Poor Vasculature-patient has had multiple peripheral attempts or PIVs lasting less than 24 hours 10/20/2016  3:00 PM  Exposed Catheter (cm) 0 cm 10/20/2016  3:00 PM  Site Assessment Clean;Dry;Intact 10/20/2016  3:00 PM  Line Status Flushed;Saline locked;Blood return noted 10/20/2016  3:00 PM  Dressing Type Transparent 10/20/2016  3:00 PM  Dressing Status Clean;Dry;Intact;Antimicrobial disc in place 10/20/2016  3:00 PM  Line Care Connections checked and tightened 10/20/2016  3:00 PM  Line Adjustment (NICU/IV Team Only) No 10/20/2016  3:00 PM  Dressing Intervention New dressing 10/20/2016  3:00 PM  Dressing Change Due 10/27/16 10/20/2016  3:00 PM       Rolena Infante 10/20/2016, 3:49 PM

## 2016-10-20 NOTE — Progress Notes (Signed)
PROGRESS NOTE    Edward Harvey  XLK:[id number] DOB: 1915-08-15 DOA: 10/17/2016 PCP: Reynold Bowen, MD   Brief Narrative: 83 y.o.malewith a past medical history significant for advanced dementia, CHB with pacer, HFpEF, HTN, CAD, CKD stage III, and Crohn's disease and diverticulosis s/pcolectomy,who presents with lethargy for 1 day. Initially found to be hypotensive requiring Levophed. Subsequently taken off vasopressors. Admitted to step down for fluid resuscitation and antibiotics.  Assessment & Plan:   # Shock, presumed septic:Likely related with healthcare associated pneumonia vs UTI Presents hypothermic, hypotensive, bradycardic, and apneic and with elevated lactic acid. Septic shock versus dehydration/failure to thrive.  -Clinically stable. Blood pressure is still borderline low. MRSA PCR negative therefore discontinue IV vancomycin. Urine culture growing ESBL Escherichia coli therefore change antibiotics to imipenem. Discussed with pharmacist. -Chest x-ray with worsening infiltrates in both lower lobes consistent with pneumonia.  -Palliative care consultation for goals of care -Currently on aspiration precautions, dysphagia 1 diet recommended by speech therapy -Continue to monitor for clinical improvement.  Transfer to regular floor. Patient is DNR/DNI.  #ESBL Escherichia coli UTI, site unspecified: Switch to IV imipenem.  # h/o Hypertension and hx of CAD: Continue aspirin, Holding furosemide    # Normocytic anemia: stable  #  Advanced Dementia without behavioral disturbance: Continue supportive care. Palliative care eval is ongoing. Likely discharge to skilled facility with outpatient hospice care.Social worker consulted.  #  Atrial fibrillation: CHADS2Vasc 5. Not on anticoagulation.  # History of Crohn's colitis: stable  # Other medications: Resume tamsulosin, allopurinol,   SW consult.   Principal Problem:   Shock [facility]) Active Problems:  Essential hypertension   Coronary atherosclerosis   Senile dementia   Atrial fibrillation (HCC)   Crohn's colitis ([place])   Normocytic anemia   Palliative care by specialist  DVT prophylaxis: Heparin subcutaneous Code Status: DNR/DNI Family Communication: No family present at bedside Disposition Plan: Likely discharge to skilled facility in 1-2 days. Transfer to regular floor    Consultants:   Palliative care  Procedures: None Antimicrobials: Vancomycin 5/17-5/19 Zosyn  May 17-5/20  Subjective: Seen and examined at bedside. Patient was alert awake and following simple commands. Review of systems Limited because of dementia. Objective: Vitals:   10/20/16 0600 10/20/16 0800 10/20/16 1200 10/20/16 1305  BP:    (!) 88/59  Pulse: 60   67  Resp: 18   14  Temp:  98.7 F (37.1 C)  98.6 F (37 C)  TempSrc:  Axillary  Axillary  SpO2: 90%  98%   Weight:      Height:        Intake/Output Summary (Last 24 hours) at 10/20/16 1452 Last data filed at 10/20/16 0529  Gross per 24 hour  Intake              660 ml  Output              475 ml  Net              185 ml   Filed Weights   10/18/16 0211 10/19/16 0403 10/20/16 0332  Weight: 71 kg (156 lb 8.4 oz) 73 kg (160 lb 15 oz) 73.2 kg (161 lb 6 oz)    Examination:  General exam: Elderly male sitting on bed comfortable, not in distress Respiratory system: Clear bilateral. Respiratory effort normal. No wheezing or crackle Cardiovascular system: Regular rate rhythm S1-S2 normal.  No pedal edema. Gastrointestinal system: Abdomen is nondistended, soft and nontender. Normal bowel sounds heard. Central  nervous system: Alert awake and following simple commands Skin: No rashes, lesions or ulcers Psychiatry: Judgement and insight appear impaired    Data Reviewed: I have personally reviewed following labs and imaging studies  CBC:  Recent Labs Lab 10/17/16 1319 10/18/16 0245 10/19/16 0245  WBC 6.2 5.8 5.6  NEUTROABS 4.6  --    --   HGB 10.9* 9.9* 10.1*  HCT 31.5* 29.3* 29.3*  MCV 94.9 96.4 93.6  PLT 141* 126* 902*   Basic Metabolic Panel:  Recent Labs Lab 10/17/16 1319 10/18/16 0245 10/19/16 0245  NA 144 143 141  K 3.9 3.6 3.6  CL 114* 117* 118*  CO2 23 18* 18*  GLUCOSE 104* 61* 89  BUN 10 9 8   CREATININE 1.03 0.97 0.96  CALCIUM 8.5* 7.8* 7.8*  MG  --  1.4* 1.9  PHOS  --  2.9  --    GFR: Estimated Creatinine Clearance: 40.6 mL/min (by C-G formula based on SCr of 0.96 mg/dL). Liver Function Tests:  Recent Labs Lab 10/17/16 1319 10/19/16 0245  AST 24 18  ALT 16* 14*  ALKPHOS 82 70  BILITOT 0.7 0.7  PROT 6.3* 5.5*  ALBUMIN 2.1* 1.9*   No results for input(s): LIPASE, AMYLASE in the last 168 hours. No results for input(s): AMMONIA in the last 168 hours. Coagulation Profile: No results for input(s): INR, PROTIME in the last 168 hours. Cardiac Enzymes:  Recent Labs Lab 10/17/16 1319  TROPONINI 0.04*   BNP (last 3 results) No results for input(s): PROBNP in the last 8760 hours. HbA1C: No results for input(s): HGBA1C in the last 72 hours. CBG:  Recent Labs Lab 10/17/16 1256  GLUCAP 87   Lipid Profile: No results for input(s): CHOL, HDL, LDLCALC, TRIG, CHOLHDL, LDLDIRECT in the last 72 hours. Thyroid Function Tests: No results for input(s): TSH, T4TOTAL, FREET4, T3FREE, THYROIDAB in the last 72 hours. Anemia Panel: No results for input(s): VITAMINB12, FOLATE, FERRITIN, TIBC, IRON, RETICCTPCT in the last 72 hours. Sepsis Labs:  Recent Labs Lab 10/17/16 1356 10/17/16 1734 10/17/16 2304 10/18/16 0245  LATICACIDVEN 2.68* 1.2 1.6 2.5*    Recent Results (from the past 240 hour(s))  Culture, blood (routine x 2)     Status: None (Preliminary result)   Collection Time: 10/17/16  1:19 PM  Result Value Ref Range Status   Specimen Description BLOOD IVSITE  Final   Special Requests   Final    BOTTLES DRAWN AEROBIC AND ANAEROBIC Blood Culture adequate volume   Culture NO GROWTH  3 DAYS  Final   Report Status PENDING  Incomplete  Culture, Urine     Status: Abnormal   Collection Time: 10/17/16  3:12 PM  Result Value Ref Range Status   Specimen Description URINE, RANDOM  Final   Special Requests NONE  Final   Culture (A)  Final    80,000 COLONIES/mL ESCHERICHIA COLI Confirmed Extended Spectrum Beta-Lactamase Producer (ESBL)    Report Status 10/20/2016 FINAL  Final   Organism ID, Bacteria ESCHERICHIA COLI (A)  Final      Susceptibility   Escherichia coli - MIC*    AMPICILLIN >=32 RESISTANT Resistant     CEFAZOLIN >=64 RESISTANT Resistant     CEFTRIAXONE >=64 RESISTANT Resistant     CIPROFLOXACIN >=4 RESISTANT Resistant     GENTAMICIN >=16 RESISTANT Resistant     IMIPENEM <=0.25 SENSITIVE Sensitive     NITROFURANTOIN <=16 SENSITIVE Sensitive     TRIMETH/SULFA >=320 RESISTANT Resistant     AMPICILLIN/SULBACTAM >=32  RESISTANT Resistant     PIP/TAZO <=4 SENSITIVE Sensitive     Extended ESBL POSITIVE Resistant     * 80,000 COLONIES/mL ESCHERICHIA COLI  MRSA PCR Screening     Status: None   Collection Time: 10/17/16  9:34 PM  Result Value Ref Range Status   MRSA by PCR NEGATIVE NEGATIVE Final    Comment:        The GeneXpert MRSA Assay (FDA approved for NASAL specimens only), is one component of a comprehensive MRSA colonization surveillance program. It is not intended to diagnose MRSA infection nor to guide or monitor treatment for MRSA infections.          Radiology Studies: No results found.      Scheduled Meds: . allopurinol  300 mg Oral q morning - 10a  . aspirin  81 mg Oral QPM  . famotidine  20 mg Oral Daily  . feeding supplement (ENSURE ENLIVE)  237 mL Oral BID BM  . heparin  5,000 Units Subcutaneous Q8H  . saccharomyces boulardii  250 mg Oral BID  . tamsulosin  0.4 mg Oral QPC supper   Continuous Infusions: . 0.9 % NaCl with KCl 20 mEq / L Stopped (10/20/16 1100)  . meropenem (MERREM) IV       LOS: 3 days    Kunal Levario Tanna Furry, MD [facility] Pager [phone]  If 7PM-7AM, please contact night-coverage [email or web address] Password [email or web address] 10/20/2016, 2:52 PM

## 2016-10-21 NOTE — Progress Notes (Signed)
Physical Therapy Treatment Patient Details Name: Edward Harvey MRN: [id number] DOB: 01/17/1916 Today's Date: 10/21/2016    History of Present Illness 83 y.o. male with a past medical history significant for advanced dementia, CHP with pacer, HFpEF, HTN, CAD, CKD stage III, and Crohn's disease and diverticulosis s/p colectomy, who presents with lethargy for 1 day.     PT Comments    Continuing work on functional mobility and activity tolerance;  Much better ability to participate today, and we were able to work on transfers; Worth considering rehab at SNF post-acutely to maximize mobility and decr caregiver burden;   Follow Up Recommendations  SNF     Equipment Recommendations  None recommended by PT    Recommendations for Other Services       Precautions / Restrictions Precautions Precautions: Fall Precaution Comments: Hand mitts Restrictions Weight Bearing Restrictions: No    Mobility  Bed Mobility Overal bed mobility: Needs Assistance Bed Mobility: Supine to Sit;Sit to Supine     Supine to sit: Mod assist;+2 for safety/equipment Sit to supine: Mod assist;+2 for safety/equipment   General bed mobility comments: Good initiation of getting feet to EOB and light mod assist to elevate trunk to sit; mod assist to help LEs back into bed when laying down  Transfers Overall transfer level: Needs assistance Equipment used: Rolling walker (2 wheeled) Transfers: Sit to/from Stand Sit to Stand: Mod assist;+2 safety/equipment         General transfer comment: Heavy mod (almost Max) assist to risea nd weight shift forward; heavy posterior lean and bracing LEs against bed for stability  Ambulation/Gait             General Gait Details: Unable   Stairs            Wheelchair Mobility    Modified Rankin (Stroke Patients Only)       Balance Overall balance assessment: Needs assistance Sitting-balance support: Feet supported;Bilateral upper extremity  supported Sitting balance-Leahy Scale: Fair       Standing balance-Leahy Scale: Zero                              Cognition Arousal/Alertness: Awake/alert Behavior During Therapy: WFL for tasks assessed/performed (for simple mobility tasks) Overall Cognitive Status: No family/caregiver present to determine baseline cognitive functioning                                        Exercises      General Comments        Pertinent Vitals/Pain Pain Assessment: Faces Faces Pain Scale: No hurt    Home Living                      Prior Function            PT Goals (current goals can now be found in the care plan section) Acute Rehab PT Goals Patient Stated Goal: unable to state, but agreeable to "take some exercise" PT Goal Formulation: Patient unable to participate in goal setting Time For Goal Achievement: 11/01/16 Potential to Achieve Goals: Poor Progress towards PT goals: Progressing toward goals    Frequency    Min 2X/week      PT Plan Current plan remains appropriate    Co-evaluation  AM-PAC PT "6 Clicks" Daily Activity  Outcome Measure  Difficulty turning over in bed (including adjusting bedclothes, sheets and blankets)?: Total Difficulty moving from lying on back to sitting on the side of the bed? : Total Difficulty sitting down on and standing up from a chair with arms (e.g., wheelchair, bedside commode, etc,.)?: Total Help needed moving to and from a bed to chair (including a wheelchair)?: A Lot Help needed walking in hospital room?: A Lot Help needed climbing 3-5 steps with a railing? : Total 6 Click Score: 8    End of Session Equipment Utilized During Treatment: Gait belt Activity Tolerance: Patient tolerated treatment well Patient left: in bed;with bed alarm set;with call bell/phone within reach Nurse Communication: Mobility status PT Visit Diagnosis: Other abnormalities of gait and mobility  (R26.89)     Time: 1120-1140 PT Time Calculation (min) (ACUTE ONLY): 20 min  Charges:  $Therapeutic Activity: 8-22 mins                    G Codes:       Roney Marion, PT  [place] Pager [phone] Office [place] 10/21/2016, 1:46 PM

## 2016-10-21 NOTE — Progress Notes (Signed)
Floor RN placed a consult to checked LA SL PICC d/t swelling. IV Nurse assessed and LA SL PICC is not swelling, flushed and have great blood return. RN aware. Will cont monitor.

## 2016-10-21 NOTE — Care Management Important Message (Signed)
Important Message  Patient Details  Name: Edward Harvey MRN: [id number] Date of Birth: May 02, 1916   Medicare Important Message Given:  Yes    Tracina Beaumont 10/21/2016, 1:19 PM

## 2016-10-21 NOTE — Consult Note (Signed)
           [facility] CM Primary Care Navigator  10/21/2016  Tyreke Kaeser Corvin 1915/08/16 [id number]   Went to see patient at the bedside to identify possible discharge needs but he was asleep and no family members seen in the room.  Will attempt to meet with patient at another time when available.  For questions, please contact:  Dannielle Huh, BSN, RN- [facility] Primary Care Navigator  Telephone: [phone] [place]

## 2016-10-21 NOTE — Progress Notes (Addendum)
[facility]  PROGRESS NOTE  DARNEL MCHAN KDT:[id number] DOB: [birth year] DOA: 10/17/2016 PCP: Reynold Bowen, MD   Brief HPI:   83 y.o.malewith a past medical history significant for advanced dementia, CHB with pacer, HFpEF, HTN, CAD, CKD stage III, and Crohn's disease and diverticulosis s/pcolectomy,who presents with lethargy for 1 day.Initially found to be hypotensive requiring Levophed. Subsequently taken off vasopressors. Admitted to step down for fluid resuscitation and antibiotics.    Subjective   Patient seen and examined, denies any pain or shortness of breath.    Assessment/Plan:     1. Sepsis due to UTI        - patient presented with hypothermia, hypotension, bradycardia, elevated lactic acid.      -  Was started on Vancomycin and zosyn. Vancomycin has been discontinued.        - zosyn changed to Meropenem, as urine culture growing ESBL E coli.       - will complete 10 days of antibiotics.   2. UTI       - as above, continue Meropenem.        3. H/o Hypertension       - BP soft, lasix on hold.  4. H/o CAD        - stable, continue aspirin.         5. Atrial fibrillation        - CHADS2VASC score is 5. Not on anticoagulation. Continue aspirin.  6. Dementia without behavior disturbance       - stable.      - palliative care consulted.  7. ? Health care associated pneumonia        - CXR showed Bibasilar airspace opacities, left greater than right.       - off vancomycin now, blood cultures negative.        -Currently on aspiration precautions, dysphagia 1 diet recommended by speech therapy  8. Left arm swelling  - check left upper extremity duplex to r/o DVT  DVT prophylaxis: Heparin  Code Status: DNR/DNI  Family Communication: No family at bedside  Disposition Plan: SNF   Consultants:  Palliative care  Procedures:  Picc line  Continuous infusions . 0.9 % NaCl with KCl 20 mEq / L 75 mL/hr at 10/20/16 1723  . meropenem (MERREM) IV  Stopped (10/21/16 0322)      Antibiotics:   Anti-infectives    Start     Dose/Rate Route Frequency Ordered Stop   10/20/16 1430  meropenem (MERREM) 1 g in sodium chloride 0.9 % 100 mL IVPB     1 g 200 mL/hr over 30 Minutes Intravenous Every 12 hours 10/20/16 1338     10/18/16 1400  vancomycin (VANCOCIN) 1,250 mg in sodium chloride 0.9 % 250 mL IVPB  Status:  Discontinued     1,250 mg 166.7 mL/hr over 90 Minutes Intravenous Every 24 hours 10/17/16 1616 10/19/16 1631   10/17/16 2200  piperacillin-tazobactam (ZOSYN) IVPB 3.375 g  Status:  Discontinued     3.375 g 12.5 mL/hr over 240 Minutes Intravenous Every 8 hours 10/17/16 1616 10/20/16 1338   10/17/16 1415  piperacillin-tazobactam (ZOSYN) IVPB 3.375 g     3.375 g 100 mL/hr over 30 Minutes Intravenous  Once 10/17/16 1401 10/17/16 1514   10/17/16 1415  vancomycin (VANCOCIN) IVPB 1000 mg/200 mL premix     1,000 mg 200 mL/hr over 60 Minutes Intravenous  Once 10/17/16 1401 10/17/16 1616       Objective   Vitals:  10/20/16 1817 10/20/16 2247 10/21/16 0631 10/21/16 0938  BP: 121/75 102/69 104/72 105/69  Pulse: 72 70 61 (!) 54  Resp:  16 16 16   Temp: 97.6 F (36.4 C) 97.4 F (36.3 C) 97.5 F (36.4 C) 97.6 F (36.4 C)  TempSrc: Oral Oral Axillary Axillary  SpO2: 96% 95% 97% 100%  Weight:  77.8 kg (171 lb 8.3 oz)    Height:        Intake/Output Summary (Last 24 hours) at 10/21/16 1439 Last data filed at 10/21/16 1331  Gross per 24 hour  Intake          1011.25 ml  Output             1600 ml  Net          -588.75 ml   Filed Weights   10/19/16 0403 10/20/16 0332 10/20/16 2247  Weight: 73 kg (160 lb 15 oz) 73.2 kg (161 lb 6 oz) 77.8 kg (171 lb 8.3 oz)     Physical Examination:   Physical Exam: Eyes: No icterus, extraocular muscles intact  Mouth: Oral mucosa is moist, no lesions on palate,  Neck: Supple, no deformities, masses, or tenderness Lungs: Normal respiratory effort, bilateral clear to auscultation, no  crackles or wheezes.  Heart: Regular rate and rhythm, S1 and S2 normal, no murmurs, rubs auscultated Abdomen: BS normoactive,soft,nondistended,non-tender to palpation,no organomegaly Extremities: left upper extremity is swollen. Neuro : Alert and oriented to  and person, No focal deficits Skin: No rashes seen on exam    Data Reviewed: I have personally reviewed following labs and imaging studies  CBG:  Recent Labs Lab 10/17/16 1256  GLUCAP 87    CBC:  Recent Labs Lab 10/17/16 1319 10/18/16 0245 10/19/16 0245  WBC 6.2 5.8 5.6  NEUTROABS 4.6  --   --   HGB 10.9* 9.9* 10.1*  HCT 31.5* 29.3* 29.3*  MCV 94.9 96.4 93.6  PLT 141* 126* 125*    Basic Metabolic Panel:  Recent Labs Lab 10/17/16 1319 10/18/16 0245 10/19/16 0245  NA 144 143 141  K 3.9 3.6 3.6  CL 114* 117* 118*  CO2 23 18* 18*  GLUCOSE 104* 61* 89  BUN 10 9 8   CREATININE 1.03 0.97 0.96  CALCIUM 8.5* 7.8* 7.8*  MG  --  1.4* 1.9  PHOS  --  2.9  --     Recent Results (from the past 240 hour(s))  Culture, blood (routine x 2)     Status: None (Preliminary result)   Collection Time: 10/17/16  1:19 PM  Result Value Ref Range Status   Specimen Description BLOOD IVSITE  Final   Special Requests   Final    BOTTLES DRAWN AEROBIC AND ANAEROBIC Blood Culture adequate volume   Culture NO GROWTH 4 DAYS  Final   Report Status PENDING  Incomplete  Culture, Urine     Status: Abnormal   Collection Time: 10/17/16  3:12 PM  Result Value Ref Range Status   Specimen Description URINE, RANDOM  Final   Special Requests NONE  Final   Culture (A)  Final    80,000 COLONIES/mL ESCHERICHIA COLI Confirmed Extended Spectrum Beta-Lactamase Producer (ESBL)    Report Status 10/20/2016 FINAL  Final   Organism ID, Bacteria ESCHERICHIA COLI (A)  Final      Susceptibility   Escherichia coli - MIC*    AMPICILLIN >=32 RESISTANT Resistant     CEFAZOLIN >=64 RESISTANT Resistant     CEFTRIAXONE >=64 RESISTANT Resistant  CIPROFLOXACIN >=4 RESISTANT Resistant     GENTAMICIN >=16 RESISTANT Resistant     IMIPENEM <=0.25 SENSITIVE Sensitive     NITROFURANTOIN <=16 SENSITIVE Sensitive     TRIMETH/SULFA >=320 RESISTANT Resistant     AMPICILLIN/SULBACTAM >=32 RESISTANT Resistant     PIP/TAZO <=4 SENSITIVE Sensitive     Extended ESBL POSITIVE Resistant     * 80,000 COLONIES/mL ESCHERICHIA COLI  MRSA PCR Screening     Status: None   Collection Time: 10/17/16  9:34 PM  Result Value Ref Range Status   MRSA by PCR NEGATIVE NEGATIVE Final    Comment:        The GeneXpert MRSA Assay (FDA approved for NASAL specimens only), is one component of a comprehensive MRSA colonization surveillance program. It is not intended to diagnose MRSA infection nor to guide or monitor treatment for MRSA infections.      Liver Function Tests:  Recent Labs Lab 10/17/16 1319 10/19/16 0245  AST 24 18  ALT 16* 14*  ALKPHOS 82 70  BILITOT 0.7 0.7  PROT 6.3* 5.5*  ALBUMIN 2.1* 1.9*   No results for input(s): LIPASE, AMYLASE in the last 168 hours. No results for input(s): AMMONIA in the last 168 hours.  Cardiac Enzymes:  Recent Labs Lab 10/17/16 1319  TROPONINI 0.04*   BNP (last 3 results) No results for input(s): BNP in the last 8760 hours.  ProBNP (last 3 results) No results for input(s): PROBNP in the last 8760 hours.    Studies: Dg Chest Port 1 View  Result Date: 10/20/2016 CLINICAL DATA:  Central line placement EXAM: PORTABLE CHEST 1 VIEW COMPARISON:  10/18/2016 FINDINGS: Left PICC line tip:  SVC.  No visible pneumothorax. The patient is rotated to the right on today's radiograph, reducing diagnostic sensitivity and specificity. Two lead pacer noted. Severe degenerative arthropathy of the glenohumeral joints, right greater than left. Tortuous thoracic aorta. Suspected mild enlargement of the cardiopericardial silhouette. Indistinct airspace opacity at the left lung base. Bandlike opacities at the right base,  likely reflecting atelectasis. Thoracic spondylosis. IMPRESSION: 1. Left PICC line tip: SVC.  No complicating feature. 2. Bibasilar airspace opacities, left greater than right. 3. Severe degenerative glenohumeral arthropathy. 4. Thoracic spondylosis. 5. Mild enlargement of the cardiopericardial silhouette. Electronically Signed   By: Van Clines M.D.   On: 10/20/2016 17:13    Scheduled Meds: . allopurinol  300 mg Oral q morning - 10a  . aspirin  81 mg Oral QPM  . famotidine  20 mg Oral Daily  . feeding supplement (ENSURE ENLIVE)  237 mL Oral BID BM  . heparin  5,000 Units Subcutaneous Q8H  . saccharomyces boulardii  250 mg Oral BID  . sodium chloride flush  10-40 mL Intracatheter Q12H  . tamsulosin  0.4 mg Oral QPC supper      Time spent: 25 min  [facility] Pager [phone]. If 7PM-7AM, please contact night-coverage at [email or web address], Office  [phone]  password [email or web address] 10/21/2016, 2:39 PM  LOS: 4 days

## 2016-10-22 ENCOUNTER — Encounter (HOSPITAL_COMMUNITY): Payer: Medicare Other

## 2016-10-22 LAB — COMPREHENSIVE METABOLIC PANEL
ALT: 12 U/L — ABNORMAL LOW (ref 17–63)
ANION GAP: 4 — AB (ref 5–15)
AST: 18 U/L (ref 15–41)
Albumin: 1.7 g/dL — ABNORMAL LOW (ref 3.5–5.0)
Alkaline Phosphatase: 59 U/L (ref 38–126)
BUN: 6 mg/dL (ref 6–20)
CO2: 22 mmol/L (ref 22–32)
Calcium: 8.2 mg/dL — ABNORMAL LOW (ref 8.9–10.3)
Chloride: 114 mmol/L — ABNORMAL HIGH (ref 101–111)
Creatinine, Ser: 0.8 mg/dL (ref 0.61–1.24)
GFR calc non Af Amer: >60 mL/min (ref >60)
Glucose, Bld: 73 mg/dL (ref 65–99)
POTASSIUM: 4.3 mmol/L (ref 3.5–5.1)
SODIUM: 140 mmol/L (ref 135–145)
Total Bilirubin: 0.8 mg/dL (ref 0.3–1.2)
Total Protein: 4.9 g/dL — ABNORMAL LOW (ref 6.5–8.1)

## 2016-10-22 LAB — CBC
HCT: 26.6 % — ABNORMAL LOW (ref 39.0–52.0)
Hemoglobin: 9.5 g/dL — ABNORMAL LOW (ref 13.0–17.0)
MCH: 33.1 pg (ref 26.0–34.0)
MCHC: 35.7 g/dL (ref 30.0–36.0)
MCV: 92.7 fL (ref 78.0–100.0)
PLATELETS: 118 10*3/uL — AB (ref 150–400)
RBC: 2.87 MIL/uL — AB (ref 4.22–5.81)
RDW: 17.7 % — ABNORMAL HIGH (ref 11.5–15.5)
WBC: 4 10*3/uL (ref 4.0–10.5)

## 2016-10-22 LAB — CULTURE, BLOOD (ROUTINE X 2)
CULTURE: NO GROWTH
Special Requests: ADEQUATE

## 2016-10-22 NOTE — Care Management Important Message (Signed)
Important Message  Patient Details  Name: Edward Harvey MRN: [id number] Date of Birth: 1915-09-20   Medicare Important Message Given:  Yes    Joshu Furukawa, Rory Percy, RN 10/22/2016, 4:10 PM

## 2016-10-22 NOTE — Progress Notes (Signed)
[facility]  PROGRESS NOTE  Edward Harvey RKY:[id number] DOB: [birth year] DOA: 10/17/2016 PCP: Reynold Bowen, MD   Brief HPI:   83 y.o.malewith a past medical history significant for advanced dementia, CHB with pacer, HFpEF, HTN, CAD, CKD stage III, and Crohn's disease and diverticulosis s/pcolectomy,who presents with lethargy for 1 day.Initially found to be hypotensive requiring Levophed. Subsequently taken off vasopressors. Admitted to step down for fluid resuscitation and antibiotics.    Subjective   Patient seen and examined, lethargic this morning. Ultrasound of the left upper extremity still pending   Assessment/Plan:     1. Sepsis due to UTI        - patient presented with hypothermia, hypotension, bradycardia, elevated lactic acid.      -  Was started on Vancomycin and zosyn. Vancomycin has been discontinued.        - zosyn changed to Meropenem, as urine culture growing ESBL E coli.       - willNeed to complete 10 days of meropenem. PICC line in place.   2. UTI       - as above, continue Meropenem.        3. H/o Hypertension       - BP soft, lasix on hold.  4. H/o CAD        - stable, continue aspirin.         5. Atrial fibrillation        - CHADS2VASC score is 5. Not on anticoagulation. Continue aspirin.  6. Dementia without behavior disturbance       - stable.      - palliative care following  7. ? Health care associated pneumonia        - CXR showed Bibasilar airspace opacities, left greater than right.       - off vancomycin now, blood cultures negative.        -Currently on aspiration precautions, dysphagia 1 diet recommended by speech therapy  8. Left arm swelling  - Ordered left upper extremity duplex to r/o DVT. Currently pending  DVT prophylaxis: Heparin  Code Status: DNR/DNI  Family Communication: No family at bedside  Disposition Plan: SNF   Consultants:  Palliative care  Procedures:  Picc line  Continuous infusions .  0.9 % NaCl with KCl 20 mEq / L 75 mL/hr at 10/22/16 1250  . meropenem (MERREM) IV Stopped (10/22/16 3151)      Antibiotics:   Anti-infectives    Start     Dose/Rate Route Frequency Ordered Stop   10/20/16 1430  meropenem (MERREM) 1 g in sodium chloride 0.9 % 100 mL IVPB     1 g 200 mL/hr over 30 Minutes Intravenous Every 12 hours 10/20/16 1338     10/18/16 1400  vancomycin (VANCOCIN) 1,250 mg in sodium chloride 0.9 % 250 mL IVPB  Status:  Discontinued     1,250 mg 166.7 mL/hr over 90 Minutes Intravenous Every 24 hours 10/17/16 1616 10/19/16 1631   10/17/16 2200  piperacillin-tazobactam (ZOSYN) IVPB 3.375 g  Status:  Discontinued     3.375 g 12.5 mL/hr over 240 Minutes Intravenous Every 8 hours 10/17/16 1616 10/20/16 1338   10/17/16 1415  piperacillin-tazobactam (ZOSYN) IVPB 3.375 g     3.375 g 100 mL/hr over 30 Minutes Intravenous  Once 10/17/16 1401 10/17/16 1514   10/17/16 1415  vancomycin (VANCOCIN) IVPB 1000 mg/200 mL premix     1,000 mg 200 mL/hr over 60 Minutes Intravenous  Once 10/17/16 1401 10/17/16 1616  Objective   Vitals:   10/21/16 0938 10/21/16 1600 10/21/16 2234 10/22/16 0640  BP: 105/69 104/73 107/73 110/80  Pulse: (!) 54 61 72 70  Resp: 16 17 16 18   Temp: 97.6 F (36.4 C) 97.8 F (36.6 C) 97.4 F (36.3 C) 97.4 F (36.3 C)  TempSrc: Axillary Axillary Oral Oral  SpO2: 100% 100% 97% 99%  Weight:   75.8 kg (167 lb 3.2 oz)   Height:        Intake/Output Summary (Last 24 hours) at 10/22/16 1432 Last data filed at 10/22/16 1351  Gross per 24 hour  Intake          2528.75 ml  Output              370 ml  Net          2158.75 ml   Filed Weights   10/20/16 0332 10/20/16 2247 10/21/16 2234  Weight: 73.2 kg (161 lb 6 oz) 77.8 kg (171 lb 8.3 oz) 75.8 kg (167 lb 3.2 oz)     Physical Examination:   Physical Exam: Eyes: No icterus, extraocular muscles intact  Mouth: Oral mucosa is moist, no lesions on palate,  Neck: Supple, no deformities, masses,  or tenderness Lungs: Normal respiratory effort, bilateral clear to auscultation, no crackles or wheezes.  Heart: Regular rate and rhythm, S1 and S2 normal, no murmurs, rubs auscultated Abdomen: BS normoactive,soft,nondistended,non-tender to palpation,no organomegaly Extremities: Left upper extremity is edematous. Neuro : Alert and oriented to time, place and person, No focal deficits Skin: No rashes seen on exam   Data Reviewed: I have personally reviewed following labs and imaging studies  CBG:  Recent Labs Lab 10/17/16 1256  GLUCAP 87    CBC:  Recent Labs Lab 10/17/16 1319 10/18/16 0245 10/19/16 0245 10/22/16 0518  WBC 6.2 5.8 5.6 4.0  NEUTROABS 4.6  --   --   --   HGB 10.9* 9.9* 10.1* 9.5*  HCT 31.5* 29.3* 29.3* 26.6*  MCV 94.9 96.4 93.6 92.7  PLT 141* 126* 125* 118*    Basic Metabolic Panel:  Recent Labs Lab 10/17/16 1319 10/18/16 0245 10/19/16 0245 10/22/16 0518  NA 144 143 141 140  K 3.9 3.6 3.6 4.3  CL 114* 117* 118* 114*  CO2 23 18* 18* 22  GLUCOSE 104* 61* 89 73  BUN 10 9 8 6   CREATININE 1.03 0.97 0.96 0.80  CALCIUM 8.5* 7.8* 7.8* 8.2*  MG  --  1.4* 1.9  --   PHOS  --  2.9  --   --     Recent Results (from the past 240 hour(s))  Culture, blood (routine x 2)     Status: None   Collection Time: 10/17/16  1:19 PM  Result Value Ref Range Status   Specimen Description BLOOD IVSITE  Final   Special Requests   Final    BOTTLES DRAWN AEROBIC AND ANAEROBIC Blood Culture adequate volume   Culture NO GROWTH 5 DAYS  Final   Report Status 10/22/2016 FINAL  Final  Culture, Urine     Status: Abnormal   Collection Time: 10/17/16  3:12 PM  Result Value Ref Range Status   Specimen Description URINE, RANDOM  Final   Special Requests NONE  Final   Culture (A)  Final    80,000 COLONIES/mL ESCHERICHIA COLI Confirmed Extended Spectrum Beta-Lactamase Producer (ESBL)    Report Status 10/20/2016 FINAL  Final   Organism ID, Bacteria ESCHERICHIA COLI (A)  Final  Susceptibility   Escherichia coli - MIC*    AMPICILLIN >=32 RESISTANT Resistant     CEFAZOLIN >=64 RESISTANT Resistant     CEFTRIAXONE >=64 RESISTANT Resistant     CIPROFLOXACIN >=4 RESISTANT Resistant     GENTAMICIN >=16 RESISTANT Resistant     IMIPENEM <=0.25 SENSITIVE Sensitive     NITROFURANTOIN <=16 SENSITIVE Sensitive     TRIMETH/SULFA >=320 RESISTANT Resistant     AMPICILLIN/SULBACTAM >=32 RESISTANT Resistant     PIP/TAZO <=4 SENSITIVE Sensitive     Extended ESBL POSITIVE Resistant     * 80,000 COLONIES/mL ESCHERICHIA COLI  MRSA PCR Screening     Status: None   Collection Time: 10/17/16  9:34 PM  Result Value Ref Range Status   MRSA by PCR NEGATIVE NEGATIVE Final    Comment:        The GeneXpert MRSA Assay (FDA approved for NASAL specimens only), is one component of a comprehensive MRSA colonization surveillance program. It is not intended to diagnose MRSA infection nor to guide or monitor treatment for MRSA infections.      Liver Function Tests:  Recent Labs Lab 10/17/16 1319 10/19/16 0245 10/22/16 0518  AST 24 18 18   ALT 16* 14* 12*  ALKPHOS 82 70 59  BILITOT 0.7 0.7 0.8  PROT 6.3* 5.5* 4.9*  ALBUMIN 2.1* 1.9* 1.7*   No results for input(s): LIPASE, AMYLASE in the last 168 hours. No results for input(s): AMMONIA in the last 168 hours.  Cardiac Enzymes:  Recent Labs Lab 10/17/16 1319  TROPONINI 0.04*   BNP (last 3 results) No results for input(s): BNP in the last 8760 hours.  ProBNP (last 3 results) No results for input(s): PROBNP in the last 8760 hours.    Studies: Dg Chest Port 1 View  Result Date: 10/20/2016 CLINICAL DATA:  Central line placement EXAM: PORTABLE CHEST 1 VIEW COMPARISON:  10/18/2016 FINDINGS: Left PICC line tip:  SVC.  No visible pneumothorax. The patient is rotated to the right on today's radiograph, reducing diagnostic sensitivity and specificity. Two lead pacer noted. Severe degenerative arthropathy of the  glenohumeral joints, right greater than left. Tortuous thoracic aorta. Suspected mild enlargement of the cardiopericardial silhouette. Indistinct airspace opacity at the left lung base. Bandlike opacities at the right base, likely reflecting atelectasis. Thoracic spondylosis. IMPRESSION: 1. Left PICC line tip: SVC.  No complicating feature. 2. Bibasilar airspace opacities, left greater than right. 3. Severe degenerative glenohumeral arthropathy. 4. Thoracic spondylosis. 5. Mild enlargement of the cardiopericardial silhouette. Electronically Signed   By: Van Clines M.D.   On: 10/20/2016 17:13    Scheduled Meds: . allopurinol  300 mg Oral q morning - 10a  . aspirin  81 mg Oral QPM  . famotidine  20 mg Oral Daily  . feeding supplement (ENSURE ENLIVE)  237 mL Oral BID BM  . heparin  5,000 Units Subcutaneous Q8H  . saccharomyces boulardii  250 mg Oral BID  . sodium chloride flush  10-40 mL Intracatheter Q12H  . tamsulosin  0.4 mg Oral QPC supper      Time spent: 25 min  [facility] Pager [phone]. If 7PM-7AM, please contact night-coverage at [email or web address], Office  [phone]  password [email or web address] 10/22/2016, 2:32 PM  LOS: 5 days

## 2016-10-22 NOTE — Consult Note (Signed)
           [facility] CM Primary Care Navigator  10/22/2016  Edward Harvey [birth year] [id number]   Went back to see patient at the bedside to identify possible discharge needs but RN and NT are providing patient care at the moment.   Will attempt to meet with patient again at another time when available in the room.   For questions, please contact:  Dannielle Huh, BSN, RN- [facility] Primary Care Navigator  Telephone: [phone] [place]

## 2016-10-22 NOTE — Progress Notes (Addendum)
  Speech Language Pathology Treatment: Dysphagia  Patient Details Name: Edward Harvey MRN: [id number] DOB: May 08, 1916 Today's Date: 10/22/2016 Time: 0940-7680 SLP Time Calculation (min) (ACUTE ONLY): 18 min  Assessment / Plan / Recommendation Clinical Impression  No family present during skilled dysphagia therapy. Pt self feed with moderate assist. Oral manipulation and transit with pudding appropriate. SLP removed cup when consuming large sips with cough x 1. Smaller, controlled sips mitigated risk. Audible swallow. Intermittent aspiration is probable. Pt on thick liquids prior, admitted with dehydration. Pt consumes and mostly water. For comfort/quality, recommend continue puree texture and thin with known aspiration risk and swallow precautions No family at bedside. Prior SLP initiated education with son.   HPI HPI: Edward Harvey a 83 y.o.malewith a past medical history significant for advanced dementia, CHP with pacer, HFpEF, HTN, CAD, CKD stage III, and Crohn's disease and diverticulosis s/pcolectomy,who presents with lethargy for 1 day. Septic shock versus dehydration/failure to thrive. Son reports Mr Edward Harvey has been on a puree/nectar thick diet for about a month because of difficutly swallowing. Typically he loves water and drinks a lot of water. He says he only drinks nectar thick juice with two staff members in particular who have success feeding him.       SLP Plan  Continue with current plan of care       Recommendations  Diet recommendations: Dysphagia 1 (puree);Thin liquid Liquids provided via: Cup;No straw Medication Administration: Crushed with puree Supervision: Staff to assist with self feeding Compensations: Small sips/bites;Slow rate;Minimize environmental distractions Postural Changes and/or Swallow Maneuvers: Seated upright 90 degrees;Upright 30-60 min after meal                Oral Care Recommendations: Oral care BID Follow up Recommendations: Skilled  Nursing facility SLP Visit Diagnosis: Dysphagia, oropharyngeal phase (R13.12) Plan: Continue with current plan of care       GO                Houston Siren 10/22/2016, 10:54 AM  Orbie Pyo Colvin Caroli.Ed [employer] [phone]

## 2016-10-22 NOTE — Progress Notes (Signed)
Patient ordered tamsulosin. Patient needs all meds crushed. Verified with pharmacy that tamsulosin should not be crushed or opened. MD notified that patient needs crushable meds. MD advised to hold medication tonight. Will contnue to monitor. Bartholomew Crews, RN

## 2016-10-23 ENCOUNTER — Inpatient Hospital Stay (HOSPITAL_COMMUNITY): Payer: Medicare Other

## 2016-10-23 DIAGNOSIS — R1312 Dysphagia, oropharyngeal phase: Secondary | ICD-10-CM | POA: Diagnosis not present

## 2016-10-23 DIAGNOSIS — M6281 Muscle weakness (generalized): Secondary | ICD-10-CM | POA: Diagnosis not present

## 2016-10-23 DIAGNOSIS — H11433 Conjunctival hyperemia, bilateral: Secondary | ICD-10-CM | POA: Diagnosis not present

## 2016-10-23 DIAGNOSIS — A000 Cholera due to Vibrio cholerae 01, biovar cholerae: Secondary | ICD-10-CM | POA: Diagnosis not present

## 2016-10-23 DIAGNOSIS — R609 Edema, unspecified: Secondary | ICD-10-CM | POA: Diagnosis not present

## 2016-10-23 DIAGNOSIS — D649 Anemia, unspecified: Secondary | ICD-10-CM | POA: Diagnosis not present

## 2016-10-23 DIAGNOSIS — K5090 Crohn's disease, unspecified, without complications: Secondary | ICD-10-CM | POA: Diagnosis not present

## 2016-10-23 DIAGNOSIS — I10 Essential (primary) hypertension: Secondary | ICD-10-CM | POA: Diagnosis not present

## 2016-10-23 DIAGNOSIS — E785 Hyperlipidemia, unspecified: Secondary | ICD-10-CM | POA: Diagnosis not present

## 2016-10-23 DIAGNOSIS — E039 Hypothyroidism, unspecified: Secondary | ICD-10-CM | POA: Diagnosis not present

## 2016-10-23 DIAGNOSIS — K921 Melena: Secondary | ICD-10-CM | POA: Diagnosis not present

## 2016-10-23 DIAGNOSIS — I4891 Unspecified atrial fibrillation: Secondary | ICD-10-CM | POA: Diagnosis not present

## 2016-10-23 DIAGNOSIS — N390 Urinary tract infection, site not specified: Secondary | ICD-10-CM | POA: Diagnosis not present

## 2016-10-23 DIAGNOSIS — A419 Sepsis, unspecified organism: Secondary | ICD-10-CM | POA: Diagnosis not present

## 2016-10-23 DIAGNOSIS — I482 Chronic atrial fibrillation: Secondary | ICD-10-CM | POA: Diagnosis not present

## 2016-10-23 DIAGNOSIS — K580 Irritable bowel syndrome with diarrhea: Secondary | ICD-10-CM | POA: Diagnosis not present

## 2016-10-23 DIAGNOSIS — Z79899 Other long term (current) drug therapy: Secondary | ICD-10-CM | POA: Diagnosis not present

## 2016-10-23 DIAGNOSIS — R278 Other lack of coordination: Secondary | ICD-10-CM | POA: Diagnosis not present

## 2016-10-23 DIAGNOSIS — R262 Difficulty in walking, not elsewhere classified: Secondary | ICD-10-CM | POA: Diagnosis not present

## 2016-10-23 DIAGNOSIS — E559 Vitamin D deficiency, unspecified: Secondary | ICD-10-CM | POA: Diagnosis not present

## 2016-10-23 DIAGNOSIS — I481 Persistent atrial fibrillation: Secondary | ICD-10-CM | POA: Diagnosis not present

## 2016-10-23 DIAGNOSIS — I959 Hypotension, unspecified: Secondary | ICD-10-CM | POA: Diagnosis not present

## 2016-10-23 DIAGNOSIS — R600 Localized edema: Secondary | ICD-10-CM | POA: Diagnosis not present

## 2016-10-23 DIAGNOSIS — Z66 Do not resuscitate: Secondary | ICD-10-CM | POA: Diagnosis not present

## 2016-10-23 DIAGNOSIS — R41841 Cognitive communication deficit: Secondary | ICD-10-CM | POA: Diagnosis not present

## 2016-10-23 DIAGNOSIS — M7989 Other specified soft tissue disorders: Secondary | ICD-10-CM | POA: Diagnosis not present

## 2016-10-23 DIAGNOSIS — R6520 Severe sepsis without septic shock: Secondary | ICD-10-CM | POA: Diagnosis not present

## 2016-10-23 DIAGNOSIS — T68XXXA Hypothermia, initial encounter: Secondary | ICD-10-CM | POA: Diagnosis not present

## 2016-10-23 DIAGNOSIS — R579 Shock, unspecified: Secondary | ICD-10-CM | POA: Diagnosis not present

## 2016-10-23 DIAGNOSIS — R4182 Altered mental status, unspecified: Secondary | ICD-10-CM | POA: Diagnosis not present

## 2016-10-23 DIAGNOSIS — R2681 Unsteadiness on feet: Secondary | ICD-10-CM | POA: Diagnosis not present

## 2016-10-23 DIAGNOSIS — R1311 Dysphagia, oral phase: Secondary | ICD-10-CM | POA: Diagnosis not present

## 2016-10-23 DIAGNOSIS — I2510 Atherosclerotic heart disease of native coronary artery without angina pectoris: Secondary | ICD-10-CM | POA: Diagnosis not present

## 2016-10-23 DIAGNOSIS — K50118 Crohn's disease of large intestine with other complication: Secondary | ICD-10-CM | POA: Diagnosis not present

## 2016-10-23 DIAGNOSIS — F0390 Unspecified dementia without behavioral disturbance: Secondary | ICD-10-CM | POA: Diagnosis not present

## 2016-10-23 LAB — CBC
HCT: 27.6 % — ABNORMAL LOW (ref 39.0–52.0)
Hemoglobin: 9.6 g/dL — ABNORMAL LOW (ref 13.0–17.0)
MCH: 32.1 pg (ref 26.0–34.0)
MCHC: 34.8 g/dL (ref 30.0–36.0)
MCV: 92.3 fL (ref 78.0–100.0)
PLATELETS: 119 10*3/uL — AB (ref 150–400)
RBC: 2.99 MIL/uL — AB (ref 4.22–5.81)
RDW: 17.6 % — AB (ref 11.5–15.5)
WBC: 5.1 10*3/uL (ref 4.0–10.5)

## 2016-10-23 LAB — BASIC METABOLIC PANEL
Anion gap: 4 — ABNORMAL LOW (ref 5–15)
BUN: 7 mg/dL (ref 6–20)
CALCIUM: 8.4 mg/dL — AB (ref 8.9–10.3)
CO2: 22 mmol/L (ref 22–32)
CREATININE: 0.72 mg/dL (ref 0.61–1.24)
Chloride: 114 mmol/L — ABNORMAL HIGH (ref 101–111)
GFR calc Af Amer: >60 mL/min (ref >60)
GFR calc non Af Amer: >60 mL/min (ref >60)
GLUCOSE: 95 mg/dL (ref 65–99)
POTASSIUM: 4.5 mmol/L (ref 3.5–5.1)
SODIUM: 140 mmol/L (ref 135–145)

## 2016-10-23 MED ORDER — FAMOTIDINE 20 MG PO TABS: 20.0000 mg | ORAL_TABLET | Freq: Every day | ORAL | Status: AC

## 2016-10-23 MED ORDER — MEROPENEM IV (FOR PTA / DISCHARGE USE ONLY): 1.0000 g | Freq: Two times a day (BID) | INTRAVENOUS | 0 refills | Status: AC

## 2016-10-23 MED ORDER — ENSURE ENLIVE PO LIQD: 237.0000 mL | Freq: Two times a day (BID) | ORAL | 12 refills | Status: AC

## 2016-10-23 NOTE — Discharge Summary (Signed)
Report called to [facility] . Denies questions. Pt transported by ambulance.

## 2016-10-23 NOTE — Progress Notes (Signed)
Pharmacy Antibiotic Note  Edward Harvey is a 83 y.o. male  with ESBL E coli UTI.  Pharmacy has been consulted for meropenem dosing.    Plan: - Meropenem 1 g IV q12h - to complete 10 days - Will follow renal function and clinical progress   Height: 5\' 8"  (172.7 cm) Weight: 174 lb (78.9 kg) IBW/kg (Calculated) : 68.4  Temp (24hrs), Avg:97.3 F (36.3 C), Min:97.3 F (36.3 C), Max:97.3 F (36.3 C)   Recent Labs Lab 10/17/16 1319 10/17/16 1356 10/17/16 1734 10/17/16 2304 10/18/16 0245 10/19/16 0245 10/22/16 0518 10/23/16 0456  WBC 6.2  --   --   --  5.8 5.6 4.0 5.1  CREATININE 1.03  --   --   --  0.97 0.96 0.80 0.72  LATICACIDVEN  --  2.68* 1.2 1.6 2.5*  --   --   --     Estimated Creatinine Clearance: 48.7 mL/min (by C-G formula based on SCr of 0.72 mg/dL).    No Known Allergies  Antimicrobials this admission: 5/20 meropenem >> 5/17 vanc>> 5/20 5/17 Zosyn>>5/20  Dose adjustments this admission: n/a  Microbiology results: 5/17BCx: ngtd 5/17 UCx: e coli (80K) - ESBL MRSA PCR- neg  Thank you for allowing pharmacy to be a part of this patient's care.  Renold Genta, PharmD, BCPS Clinical Pharmacist Phone for today - [place] - [phone] 10/23/2016 7:35 AM

## 2016-10-23 NOTE — Progress Notes (Signed)
PHARMACY CONSULT NOTE FOR:  OUTPATIENT  PARENTERAL ANTIBIOTIC THERAPY (OPAT)  Indication: ESBL E.coli UTI  Regimen: meropenem 1 g IV q12h End date: 10/27/16  IV antibiotic discharge orders are pended. To discharging provider:  please sign these orders via discharge navigator,  Select New Orders & click on the button choice - Manage This Unsigned Work.     Thank you for allowing pharmacy to be a part of this patient's care.  Renold Genta, PharmD, BCPS Clinical Pharmacist Phone for today - [place] - [phone] 10/23/2016 11:10 AM

## 2016-10-23 NOTE — Clinical Social Work Note (Signed)
Edward Harvey is medically stable for discharge and will be returning to [facility] where he is a long-term care resident. Son contacted regarding discharge and discharge clinicals transmitted to facility. Mr. Commins will be transported by ambulance.   Kylor Valverde Givens, MSW, LCSW Licensed Clinical Social Worker [place] [phone]

## 2016-10-23 NOTE — Discharge Summary (Signed)
Physician Discharge Summary   Patient ID: Edward Harvey MRN: [id number] DOB/AGE: 1915-12-31 83 y.o.  Admit date: 10/17/2016 Discharge date: 10/23/2016  Primary Care Physician:  Reynold Bowen, MD  Discharge Diagnoses:    . Sepsis secondary to ESBL UTI . Essential hypertension . Coronary atherosclerosis . Atrial fibrillation ([place]) . Senile advanced dementia . Crohn's colitis ([place]) . Normocytic anemia   Healthcare associated pneumonia   Dysphagia   Consults: Speech pathology  Recommendations for Outpatient Follow-up:  1. Please repeat CBC/BMET at next visit 2. Please continue meropenem 1 g IV every 12 hours for ESBL UTI last day of therapy 10/27/16. PICC line can be removed after the last day of therapy   DIET: Dysphagia 1 diet, pureed. Diet with thin liquids. Water only to drink, no straws, crush meds in.  Allergies:  No Known Allergies   DISCHARGE MEDICATIONS: Current Discharge Medication List    START taking these medications   Details  feeding supplement, ENSURE ENLIVE, (ENSURE ENLIVE) LIQD Take 237 mLs by mouth 2 (two) times daily between meals. Qty: 237 mL, Refills: 12    meropenem (MERREM) IVPB Inject 1 g into the vein every 12 (twelve) hours. Indication:  ESBL E coli UTI Last Day of Therapy:  10/27/16 Labs - Once weekly:  CBC/D and BMP, Labs - Every other week:  ESR and CRP Qty: 8 Units, Refills: 0      CONTINUE these medications which have CHANGED   Details  famotidine (PEPCID) 20 MG tablet Take 1 tablet (20 mg total) by mouth daily.      CONTINUE these medications which have NOT CHANGED   Details  allopurinol (ZYLOPRIM) 300 MG tablet Take 300 mg by mouth every morning.     aspirin 81 MG chewable tablet Chew 81 mg by mouth every evening.     Cholecalciferol (VITAMIN D) 1000 UNITS capsule Take 1,000 Units by mouth every morning.     diphenoxylate-atropine (LOMOTIL) 2.5-0.025 MG per tablet Take 2 tablets by mouth 4 (four) times daily as needed for  diarrhea or loose stools.    magnesium hydroxide (MILK OF MAGNESIA) 400 MG/5ML suspension Take 30 mLs by mouth daily as needed for mild constipation.    Multiple Vitamins-Minerals (CENTRUM SILVER PO) Take 1 tablet by mouth every morning.     saccharomyces boulardii (FLORASTOR) 250 MG capsule Take 1 capsule (250 mg total) by mouth 2 (two) times daily. Qty: 30 capsule, Refills: 0    tamsulosin (FLOMAX) 0.4 MG CAPS capsule Take 0.4 mg by mouth daily after supper.    Trospium Chloride (SANCTURA XR) 60 MG CP24 Take 60 mg by mouth every evening.     vitamin C (ASCORBIC ACID) 500 MG tablet Take 1 tablet (500 mg total) by mouth daily.      STOP taking these medications     furosemide (LASIX) 20 MG tablet          Brief H and P: For complete details please refer to admission H and P, but in brief 83 y.o.malewith a past medical history significant for advanced dementia, CHB with pacer, HFpEF, HTN, CAD, CKD stage III, and Crohn's disease and diverticulosis s/pcolectomy,who presented with lethargy for one day.Initially was found to be hypotensive requiring vasopressors/Levophed by critical care medicine.  Discussion was held by [facility] M with both sons of the patient at the bedside and family requested given advanced age and dementia to take off vasopressors and continue medical management of presumed sepsis. They did not want the patient to  be alive on machines. Patient was then placed on DNR status and admitted to stepdown unit for fluid resuscitation and antibiotics.   Hospital Course:     UTI due to extended-spectrum beta lactamase (ESBL) producing Escherichia coli, sepsis Patient met sepsis criteria at the time of admission due to hypotension BP 87/66, hypothermia, temp 94.36F, lethargic, source UTI. Patient was initially started on vasopressors/Levophed by critical care medicine. Discussion was held by [facility] M with both sons of the patient at the bedside and family requested given advanced age  and dementia to take off vasopressors and continue medical management of presumed sepsis. They did not want the patient to be alive on machines. Patient was then placed on DNR status and admitted to stepdown unit for fluid resuscitation and antibiotics.  patient was then placed on IV vancomycin and Zosyn and IV fluid resuscitation. Palliative care was also consulted. Urine culture showed ESBL Escherichia coli, antibiotics were changed to IV meropenem. Please continue meropenem 1 g IV q 12 hours, stop date 10/27/16.  - PICC line has been placed, can be removed after the IV antibiotic course has been completed. Doppler ultrasound in the left upper extremity is negative for any DVT.  Hypotension - Patient has a history of hypertension, continue to hold furosemide. Patient received IV fluids due to sepsis  Normocytic anemia Currently stable  Dementia Currently advanced, per son oriented to self only at baseline. Partially blind from glaucoma  Atrial fibrillation Not on any anticoagulation due to advanced dementia and partial blindness CHADS2Vasc 5  History of Crohn's colitis Currently stable    Day of Discharge BP 99/72   Pulse (!) 54   Temp 98.7 F (37.1 C) (Oral)   Resp 15   Ht _0  (1.727 m)   Wt 78.9 kg (174 lb)   SpO2 98%   BMI 26.46 kg/m   Physical Exam: General: Alert and awake oriented x self, not in any acute distress. HEENT: anicteric sclera, pupils reactive to light and accommodation CVS: S1-S2 clear no murmur rubs or gallops Chest: clear to auscultation bilaterally, no wheezing rales or rhonchi Abdomen: soft nontender, nondistended, normal bowel sounds Extremities: no cyanosis, clubbing or edema noted bilaterally    The results of significant diagnostics from this hospitalization (including imaging, microbiology, ancillary and laboratory) are listed below for reference.    LAB RESULTS: Basic Metabolic Panel:  Recent Labs Lab 10/18/16 0245 10/19/16 0245  10/22/16 0518 10/23/16 0456  NA 143 141 140 140  K 3.6 3.6 4.3 4.5  CL 117* 118* 114* 114*  CO2 18* 18* 22 22  GLUCOSE 61* 89 73 95  BUN _1 CREATININE 0.97 0.96 0.80 0.72  CALCIUM 7.8* 7.8* 8.2* 8.4*  MG 1.4* 1.9  --   --   PHOS 2.9  --   --   --    Liver Function Tests:  Recent Labs Lab 10/19/16 0245 10/22/16 0518  AST 18 18  ALT 14* 12*  ALKPHOS 70 59  BILITOT 0.7 0.8  PROT 5.5* 4.9*  ALBUMIN 1.9* 1.7*   No results for input(s): LIPASE, AMYLASE in the last 168 hours. No results for input(s): AMMONIA in the last 168 hours. CBC:  Recent Labs Lab 10/17/16 1319  10/22/16 0518 10/23/16 0456  WBC 6.2  < > 4.0 5.1  NEUTROABS 4.6  --   --   --   HGB 10.9*  < > 9.5* 9.6*  HCT 31.5*  < > 26.6* 27.6*  MCV 94.9  < >  92.7 92.3  PLT 141*  < > 118* 119*  < > = values in this interval not displayed. Cardiac Enzymes:  Recent Labs Lab 10/17/16 1319  TROPONINI 0.04*   BNP: Invalid input(s): POCBNP CBG:  Recent Labs Lab 10/17/16 1256  GLUCAP 87    Significant Diagnostic Studies:  Dg Chest 1 View  Result Date: 10/17/2016 CLINICAL DATA:  Changing consciousness. EXAM: CHEST 1 VIEW COMPARISON:  September 10, 2013 FINDINGS: No pneumothorax. The cardiomediastinal silhouette is normal. Opacity in the left base appears to be largely chronic. The right lung is clear. Stable pacemaker. No other changes. IMPRESSION: No acute change.  Opacity in left base appears to be chronic. Electronically Signed   By: Dorise Bullion III M.D   On: 10/17/2016 12:49   Ct Head Wo Contrast  Result Date: 10/17/2016 CLINICAL DATA:  Altered mental status. EXAM: CT HEAD WITHOUT CONTRAST TECHNIQUE: Contiguous axial images were obtained from the base of the skull through the vertex without intravenous contrast. COMPARISON:  CT scan of August 13, 2013. FINDINGS: Brain: Moderate diffuse cortical atrophy is noted. Mild chronic ischemic white matter disease is noted. No mass effect or midline shift is  noted. Ventricular size is within normal limits. There is no evidence of mass lesion, hemorrhage or acute infarction. Vascular: No hyperdense vessel or unexpected calcification. Skull: Bilateral frontal craniotomies are noted. No acute abnormality is seen in the skull. Sinuses/Orbits: No acute finding. Other: None. IMPRESSION: Moderate diffuse cortical atrophy. Mild chronic ischemic white matter disease. No acute intracranial abnormality seen. Electronically Signed   By: Marijo Conception, M.D.   On: 10/17/2016 12:41   Dg Chest Port 1 View  Result Date: 10/18/2016 CLINICAL DATA:  Fever EXAM: PORTABLE CHEST 1 VIEW COMPARISON:  10/17/2016 FINDINGS: Chronic cardiomegaly. Chronic aortic atherosclerosis. Dual lead pacemaker in place. Worsening of infiltrate/volume loss in both lower lobes consistent with pneumonia. Upper lungs are clear. No acute bone finding. IMPRESSION: Worsened infiltrate and volume loss in both lower lobes consistent with pneumonia. Electronically Signed   By: Nelson Chimes M.D.   On: 10/18/2016 12:37    2D ECHO:   Disposition and Follow-up: Discharge Instructions    Discharge instructions    Complete by:  As directed    Dysphagia 1 diet,thin liquids   Home infusion instructions [facility] May follow [place] Dosing Protocol; May administer Cathflo as needed to maintain patency of vascular access device.; Flushing of vascular access device: per [facility] Protocol: 0.9% NaCl pre/post medica...    Complete by:  As directed    Instructions:  May follow [place] Dosing Protocol   Instructions:  May administer Cathflo as needed to maintain patency of vascular access device.   Instructions:  Flushing of vascular access device: per [facility] Protocol: 0.9% NaCl pre/post medication administration and prn patency; Heparin 100 u/ml, 38m for implanted ports and Heparin 10u/ml, 575mfor all other central venous catheters.   Instructions:  May follow [facility] Anaphylaxis Protocol for First Dose  Administration in the home: 0.9% NaCl at 25-50 ml/hr to maintain IV access for protocol meds. Epinephrine 0.3 ml IV/IM PRN and Benadryl 25-50 IV/IM PRN s/s of anaphylaxis.   Instructions:  AdLiebenthalnfusion Coordinator (RN) to assist per patient IV care needs in the home PRN.   Increase activity slowly    Complete by:  As directed        DISheldonMD. Schedule  an appointment as soon as possible for a visit in 2 weeks.   Specialty:  Endocrinology Why:  Lattie Haw (Office scheduler) will contact you to discuss post hospital visit with Dr. Forde Dandy. Thank you.  Contact information: [address] [place] [address] [phone]            Time spent on Discharge: 39 minutes  Signed:   Estill Cotta M.D. [facility] 10/23/2016, 12:13 PM Pager: [phone]

## 2016-10-23 NOTE — Progress Notes (Signed)
*  PRELIMINARY RESULTS* Vascular Ultrasound Left upper extremity venous duplex has been completed.  Preliminary findings: technically difficult due to poor patient cooperation. No obvious DVT or SVT noted in visualized veins of the LUE.    Landry Mellow, RDMS, RVT  10/23/2016, 11:28 AM

## 2016-10-23 NOTE — NC FL2 (Signed)
[place] MEDICAID FL2 LEVEL OF CARE SCREENING TOOL     IDENTIFICATION  Patient Name: Edward Harvey Birthdate: Mar 12, 1916 Sex: male Admission Date (Current Location): 10/17/2016  [place] and [place] Number:  Kathleen Argue [id number] [place] and Address:  [facility], [place] [address], [place], [place] [address]      Provider Number: [id number]  Attending Physician Name and Address:  Mendel Corning, MD  Relative Name and Phone Number:  Srikar, Chiang - [phone] (home),  [phone] (mobile) and Bowman, Higbie [phone] (home), [phone] (mobile)      Current Level of Care: Hospital Recommended Level of Care: [place] Prior Approval Number:    Date Approved/Denied:   PASRR Number:    Discharge Plan: SNF (From [facility])    Current Diagnoses: Patient Active Problem List   Diagnosis Date Noted  . UTI due to extended-spectrum beta lactamase (ESBL) producing Escherichia coli   . Palliative care by specialist   . Shock ([place]) 10/17/2016  . Normocytic anemia 10/17/2016  . Hypothermia   . Somnolence   . Diarrhea 12/30/2014  . History of Clostridium difficile colitis 12/30/2014  . History of prostate cancer 05/03/2014  . Hypernatremia 04/11/2014  . Hypokalemia 04/11/2014  . ARF (acute renal failure) ([place]) 04/10/2014  . SBO (small bowel obstruction) ([place]) 04/09/2014  . Acute renal failure syndrome ()   . Chronic venous insufficiency 11/04/2013  . Edema 11/04/2013  . Acute gastroenteritis 09/12/2013  . Acute on chronic renal failure ([place]) 09/10/2013  . Nausea with vomiting 09/10/2013  . Severe protein-calorie malnutrition ([place]) 09/10/2013  . BPH (benign prostatic hyperplasia) 09/10/2013  . Hypovolemia dehydration 09/10/2013  . Acute renal failure ([place]) 08/11/2013  . Left arm weakness 08/11/2013  . Hypotension 05/26/2013  . H/O prostate cancer 12/08/2012  . Syncope 09/06/2012  . Crohn's colitis  ([place]) 06/09/2012  . Renal insufficiency 06/09/2012  . History of subdural hematoma (post traumatic) 06/09/2012  . Gait abnormality 06/09/2012  . Atrial fibrillation ([place]) 03/25/2012  . Senile dementia 03/19/2012  . PPM-[place] 01/14/2011  . Coronary atherosclerosis 06/16/2008  . AV BLOCK, COMPLETE 06/16/2008  . INGUINAL HERNIA 06/16/2008  . Osteoarthritis 06/16/2008  . Essential hypertension 11/26/2007  . CARCINOMA, PROSTATE, HX OF 11/26/2007    Orientation RESPIRATION BLADDER Height & Weight     Self  Normal Incontinent (External urinary catheter) Weight: 174 lb (78.9 kg) Height:  5\' 8"  (172.7 cm)  BEHAVIORAL SYMPTOMS/MOOD NEUROLOGICAL BOWEL NUTRITION STATUS      Continent Diet (DYS 1 - pureed)  AMBULATORY STATUS COMMUNICATION OF NEEDS Skin   Total Care (Patient wheelchair bound at baseline) Verbally Other (Comment) (Ecchymosis left arm)                       Personal Care Assistance Level of Assistance  Bathing, Feeding, Dressing Bathing Assistance: Limited assistance Feeding assistance: Independent Dressing Assistance: Limited assistance     Functional Limitations Info  Sight, Hearing, Speech Sight Info: Adequate Hearing Info: Impaired Speech Info: Adequate    SPECIAL CARE FACTORS FREQUENCY  PT (By licensed PT), Speech therapy     PT Frequency: Evaluated 5/18 and a minimum of 2X per week therapy recommended       Speech Therapy Frequency: Evaluated 1/18      Contractures Contractures Info: Not present    Additional Factors Info  Code Status, Allergies Code Status Info: DNR Allergies Info: No known allergies  Current Medications (10/23/2016):  This is the current hospital active medication list Current Facility-Administered Medications  Medication Dose Route Frequency Provider Last Rate Last Dose  . 0.9 % NaCl with KCl 20 mEq/ L  infusion   Intravenous Continuous Rosita Fire, MD 75 mL/hr at 10/23/16 0303    . acetaminophen  (TYLENOL) tablet 650 mg  650 mg Oral Q6H PRN Danford, Suann Larry, MD       Or  . acetaminophen (TYLENOL) suppository 650 mg  650 mg Rectal Q6H PRN Danford, Suann Larry, MD      . allopurinol (ZYLOPRIM) tablet 300 mg  300 mg Oral q morning - 10a Reyne Dumas, MD   300 mg at 10/23/16 0949  . aspirin chewable tablet 81 mg  81 mg Oral QPM Reyne Dumas, MD   81 mg at 10/22/16 1824  . famotidine (PEPCID) tablet 20 mg  20 mg Oral Daily Reyne Dumas, MD   20 mg at 10/23/16 0949  . feeding supplement (ENSURE ENLIVE) (ENSURE ENLIVE) liquid 237 mL  237 mL Oral BID BM Danford, Suann Larry, MD   237 mL at 10/23/16 1312  . heparin injection 5,000 Units  5,000 Units Subcutaneous Q8H Corey Harold, NP   5,000 Units at 10/23/16 1311  . meropenem (MERREM) 1 g in sodium chloride 0.9 % 100 mL IVPB  1 g Intravenous Q12H Kris Mouton, [facility]   Stopped at 10/23/16 1341  . ondansetron (ZOFRAN) tablet 4 mg  4 mg Oral Q6H PRN Danford, Suann Larry, MD       Or  . ondansetron (ZOFRAN) injection 4 mg  4 mg Intravenous Q6H PRN Danford, Suann Larry, MD      . saccharomyces boulardii (FLORASTOR) capsule 250 mg  250 mg Oral BID Reyne Dumas, MD   250 mg at 10/23/16 0949  . sodium chloride flush (NS) 0.9 % injection 10-40 mL  10-40 mL Intracatheter Q12H Rosita Fire, MD   10 mL at 10/23/16 0950  . sodium chloride flush (NS) 0.9 % injection 10-40 mL  10-40 mL Intracatheter PRN Rosita Fire, MD   10 mL at 10/23/16 0509  . tamsulosin (FLOMAX) capsule 0.4 mg  0.4 mg Oral QPC supper Reyne Dumas, MD   0.4 mg at 10/21/16 1613     Discharge Medications: Please see discharge summary for a list of discharge medications.  Relevant Imaging Results:  Relevant Lab Results:   Additional Information ss#[id number].  Sable Feil, LCSW

## 2016-10-23 NOTE — Clinical Social Work Note (Signed)
Clinical Social Work Assessment  Patient Details  Name: Edward Harvey MRN: [id number] Date of Birth: 07-10-1915  Date of referral:  10/23/16               Reason for consult:  Facility Placement                Permission sought to share information with:  Family Supports Permission granted to share information::  No (Patient oriented only to self)  Name::     Sabino Snipes, Brooke Bonito.   Agency::     Relationship::  Son  Contact Information:  [phone]  Housing/Transportation Living arrangements for the past 2 months:  [place] (From [facility]) Source of Information:  Other (Comment Required) (Chart, admissions director at [facility]) Patient Interpreter Needed:  None Criminal Activity/Legal Involvement Pertinent to Current Situation/Hospitalization:  No - Comment as needed Significant Relationships:  Adult Children Lives with:  Facility Resident Do you feel safe going back to the place where you live?  Yes Need for family participation in patient care:  Yes (Comment)  Care giving concerns: No concerns expressed by son regarding care at skilled nursing facility.   Social Worker assessment / plan:  CSW talked with son Tavaris regarding patient's discharge and he confirmed that patient would be returning to [place], where he is a long-term care resident.  Employment status:  Retired Forensic scientist:  Information systems manager, [employer] [place] PT Recommendations:  [place] / Referral to community resources:  Other (Comment Required) (None needed or requested as patient from SNF)  Patient/Family's Response to care: No concerns expressed to CSW regarding care during hospitalization.  Patient/Family's Understanding of and Emotional Response to Diagnosis, Current Treatment, and Prognosis:  Not discussed.  Emotional Assessment Appearance:  Appears stated age Attitude/Demeanor/Rapport:  Unable to Assess Affect (typically observed):   Unable to Assess Orientation:  Oriented to Self Alcohol / Substance use:  Tobacco Use, Alcohol Use, Illicit Drugs (Reported in H&P that patient does not smoke, drink or use illicit drugs) Psych involvement (Current and /or in the community):  No (Comment)  Discharge Needs  Concerns to be addressed:  Discharge Planning Concerns Readmission within the last 30 days:  No Current discharge risk:  None Barriers to Discharge:  No Barriers Identified   Sable Feil, LCSW 10/23/2016, 5:10 PM

## 2016-10-24 DIAGNOSIS — K5090 Crohn's disease, unspecified, without complications: Secondary | ICD-10-CM | POA: Diagnosis not present

## 2016-10-24 DIAGNOSIS — I482 Chronic atrial fibrillation: Secondary | ICD-10-CM | POA: Diagnosis not present

## 2016-10-24 DIAGNOSIS — A419 Sepsis, unspecified organism: Secondary | ICD-10-CM | POA: Diagnosis not present

## 2016-10-24 DIAGNOSIS — N390 Urinary tract infection, site not specified: Secondary | ICD-10-CM | POA: Diagnosis not present

## 2016-10-30 DIAGNOSIS — I4891 Unspecified atrial fibrillation: Secondary | ICD-10-CM | POA: Diagnosis not present

## 2016-10-30 DIAGNOSIS — N390 Urinary tract infection, site not specified: Secondary | ICD-10-CM | POA: Diagnosis not present

## 2016-10-30 DIAGNOSIS — Z66 Do not resuscitate: Secondary | ICD-10-CM | POA: Diagnosis not present

## 2016-10-30 DIAGNOSIS — I2510 Atherosclerotic heart disease of native coronary artery without angina pectoris: Secondary | ICD-10-CM | POA: Diagnosis not present

## 2016-10-30 DIAGNOSIS — F0390 Unspecified dementia without behavioral disturbance: Secondary | ICD-10-CM | POA: Diagnosis not present

## 2016-11-01 DIAGNOSIS — I482 Chronic atrial fibrillation: Secondary | ICD-10-CM | POA: Diagnosis not present

## 2016-11-01 DIAGNOSIS — F0390 Unspecified dementia without behavioral disturbance: Secondary | ICD-10-CM | POA: Diagnosis not present

## 2016-11-01 DIAGNOSIS — I10 Essential (primary) hypertension: Secondary | ICD-10-CM | POA: Diagnosis not present

## 2016-11-01 DIAGNOSIS — R609 Edema, unspecified: Secondary | ICD-10-CM | POA: Diagnosis not present

## 2016-11-13 DIAGNOSIS — K5090 Crohn's disease, unspecified, without complications: Secondary | ICD-10-CM | POA: Diagnosis not present

## 2016-11-13 DIAGNOSIS — F0390 Unspecified dementia without behavioral disturbance: Secondary | ICD-10-CM | POA: Diagnosis not present

## 2016-11-13 DIAGNOSIS — K921 Melena: Secondary | ICD-10-CM | POA: Diagnosis not present

## 2016-11-13 DIAGNOSIS — D649 Anemia, unspecified: Secondary | ICD-10-CM | POA: Diagnosis not present

## 2016-11-18 DIAGNOSIS — H11433 Conjunctival hyperemia, bilateral: Secondary | ICD-10-CM | POA: Diagnosis not present

## 2016-11-18 DIAGNOSIS — D649 Anemia, unspecified: Secondary | ICD-10-CM | POA: Diagnosis not present

## 2016-11-18 DIAGNOSIS — K5090 Crohn's disease, unspecified, without complications: Secondary | ICD-10-CM | POA: Diagnosis not present

## 2016-11-18 DIAGNOSIS — I10 Essential (primary) hypertension: Secondary | ICD-10-CM | POA: Diagnosis not present

## 2017-01-01 DEATH — deceased
